# Patient Record
Sex: Female | Born: 1940 | Race: White | Hispanic: No | Marital: Married | Smoking: Never smoker
Health system: Southern US, Community
[De-identification: ages and names within clinical notes are randomized; demographics above are authoritative.]

## PROBLEM LIST (undated history)

## (undated) DIAGNOSIS — E119 Type 2 diabetes mellitus without complications: Secondary | ICD-10-CM

## (undated) DIAGNOSIS — T7840XA Allergy, unspecified, initial encounter: Secondary | ICD-10-CM

## (undated) DIAGNOSIS — F32A Depression, unspecified: Secondary | ICD-10-CM

## (undated) DIAGNOSIS — T8859XA Other complications of anesthesia, initial encounter: Secondary | ICD-10-CM

## (undated) DIAGNOSIS — G4730 Sleep apnea, unspecified: Secondary | ICD-10-CM

## (undated) DIAGNOSIS — I10 Essential (primary) hypertension: Secondary | ICD-10-CM

## (undated) DIAGNOSIS — F329 Major depressive disorder, single episode, unspecified: Secondary | ICD-10-CM

## (undated) DIAGNOSIS — K5790 Diverticulosis of intestine, part unspecified, without perforation or abscess without bleeding: Secondary | ICD-10-CM

## (undated) DIAGNOSIS — R011 Cardiac murmur, unspecified: Secondary | ICD-10-CM

## (undated) DIAGNOSIS — F419 Anxiety disorder, unspecified: Secondary | ICD-10-CM

## (undated) DIAGNOSIS — K219 Gastro-esophageal reflux disease without esophagitis: Secondary | ICD-10-CM

## (undated) DIAGNOSIS — T4145XA Adverse effect of unspecified anesthetic, initial encounter: Secondary | ICD-10-CM

## (undated) DIAGNOSIS — C801 Malignant (primary) neoplasm, unspecified: Secondary | ICD-10-CM

## (undated) DIAGNOSIS — E785 Hyperlipidemia, unspecified: Secondary | ICD-10-CM

## (undated) DIAGNOSIS — M1990 Unspecified osteoarthritis, unspecified site: Secondary | ICD-10-CM

## (undated) HISTORY — DX: Depression, unspecified: F32.A

## (undated) HISTORY — PX: BACK SURGERY: SHX140

## (undated) HISTORY — DX: Cardiac murmur, unspecified: R01.1

## (undated) HISTORY — DX: Essential (primary) hypertension: I10

## (undated) HISTORY — DX: Unspecified osteoarthritis, unspecified site: M19.90

## (undated) HISTORY — DX: Hyperlipidemia, unspecified: E78.5

## (undated) HISTORY — PX: LUMBAR LAMINECTOMY: SHX95

## (undated) HISTORY — DX: Gastro-esophageal reflux disease without esophagitis: K21.9

## (undated) HISTORY — PX: EYE SURGERY: SHX253

## (undated) HISTORY — PX: LUMBAR FUSION: SHX111

## (undated) HISTORY — DX: Major depressive disorder, single episode, unspecified: F32.9

## (undated) HISTORY — DX: Sleep apnea, unspecified: G47.30

## (undated) HISTORY — DX: Allergy, unspecified, initial encounter: T78.40XA

## (undated) HISTORY — DX: Anxiety disorder, unspecified: F41.9

## (undated) HISTORY — PX: UPPER GASTROINTESTINAL ENDOSCOPY: SHX188

## (undated) HISTORY — PX: COLONOSCOPY: SHX174

---

## 1999-07-04 ENCOUNTER — Encounter: Admission: RE | Admit: 1999-07-04 | Discharge: 1999-07-04 | Payer: Self-pay | Admitting: Obstetrics and Gynecology

## 1999-07-04 ENCOUNTER — Encounter: Payer: Self-pay | Admitting: Obstetrics and Gynecology

## 1999-07-07 ENCOUNTER — Encounter: Admission: RE | Admit: 1999-07-07 | Discharge: 1999-07-07 | Payer: Self-pay | Admitting: Obstetrics and Gynecology

## 1999-07-07 ENCOUNTER — Encounter: Payer: Self-pay | Admitting: Obstetrics and Gynecology

## 1999-09-14 ENCOUNTER — Other Ambulatory Visit: Admission: RE | Admit: 1999-09-14 | Discharge: 1999-09-14 | Payer: Self-pay | Admitting: Obstetrics and Gynecology

## 2000-10-02 ENCOUNTER — Encounter: Admission: RE | Admit: 2000-10-02 | Discharge: 2000-10-02 | Payer: Self-pay | Admitting: Obstetrics and Gynecology

## 2000-10-02 ENCOUNTER — Encounter: Payer: Self-pay | Admitting: Obstetrics and Gynecology

## 2001-02-05 ENCOUNTER — Other Ambulatory Visit: Admission: RE | Admit: 2001-02-05 | Discharge: 2001-02-05 | Payer: Self-pay | Admitting: Internal Medicine

## 2002-09-01 ENCOUNTER — Encounter: Payer: Self-pay | Admitting: Obstetrics and Gynecology

## 2002-09-01 ENCOUNTER — Encounter: Admission: RE | Admit: 2002-09-01 | Discharge: 2002-09-01 | Payer: Self-pay | Admitting: Obstetrics and Gynecology

## 2003-09-07 ENCOUNTER — Encounter: Admission: RE | Admit: 2003-09-07 | Discharge: 2003-09-07 | Payer: Self-pay | Admitting: Internal Medicine

## 2003-12-15 ENCOUNTER — Inpatient Hospital Stay (HOSPITAL_COMMUNITY): Admission: RE | Admit: 2003-12-15 | Discharge: 2003-12-20 | Payer: Self-pay | Admitting: Orthopaedic Surgery

## 2004-07-25 ENCOUNTER — Ambulatory Visit: Payer: Self-pay | Admitting: Internal Medicine

## 2004-07-31 ENCOUNTER — Ambulatory Visit: Payer: Self-pay | Admitting: Internal Medicine

## 2004-08-10 ENCOUNTER — Ambulatory Visit: Payer: Self-pay | Admitting: Internal Medicine

## 2004-10-10 ENCOUNTER — Ambulatory Visit: Payer: Self-pay | Admitting: Internal Medicine

## 2004-11-21 ENCOUNTER — Ambulatory Visit: Payer: Self-pay | Admitting: Internal Medicine

## 2005-01-02 ENCOUNTER — Ambulatory Visit: Payer: Self-pay | Admitting: Internal Medicine

## 2005-02-20 ENCOUNTER — Ambulatory Visit (HOSPITAL_COMMUNITY): Admission: RE | Admit: 2005-02-20 | Discharge: 2005-02-20 | Payer: Self-pay | Admitting: Obstetrics and Gynecology

## 2005-03-13 ENCOUNTER — Ambulatory Visit: Payer: Self-pay | Admitting: Internal Medicine

## 2005-05-11 ENCOUNTER — Ambulatory Visit: Payer: Self-pay | Admitting: Internal Medicine

## 2005-06-06 ENCOUNTER — Ambulatory Visit: Payer: Self-pay | Admitting: Internal Medicine

## 2005-06-14 ENCOUNTER — Ambulatory Visit: Payer: Self-pay | Admitting: Internal Medicine

## 2005-10-25 ENCOUNTER — Ambulatory Visit: Payer: Self-pay | Admitting: Internal Medicine

## 2005-11-08 ENCOUNTER — Ambulatory Visit (HOSPITAL_BASED_OUTPATIENT_CLINIC_OR_DEPARTMENT_OTHER): Admission: RE | Admit: 2005-11-08 | Discharge: 2005-11-08 | Payer: Self-pay | Admitting: Internal Medicine

## 2005-11-20 ENCOUNTER — Ambulatory Visit: Payer: Self-pay | Admitting: Pulmonary Disease

## 2006-01-09 ENCOUNTER — Ambulatory Visit: Payer: Self-pay | Admitting: Internal Medicine

## 2006-01-31 ENCOUNTER — Ambulatory Visit: Payer: Self-pay | Admitting: Internal Medicine

## 2006-05-09 ENCOUNTER — Ambulatory Visit: Payer: Self-pay | Admitting: Internal Medicine

## 2006-07-04 ENCOUNTER — Ambulatory Visit: Payer: Self-pay | Admitting: Internal Medicine

## 2006-09-03 HISTORY — PX: HIATAL HERNIA REPAIR: SHX195

## 2006-09-26 ENCOUNTER — Encounter (INDEPENDENT_AMBULATORY_CARE_PROVIDER_SITE_OTHER): Payer: Self-pay | Admitting: *Deleted

## 2006-09-26 ENCOUNTER — Ambulatory Visit: Payer: Self-pay | Admitting: Internal Medicine

## 2006-11-18 ENCOUNTER — Ambulatory Visit: Payer: Self-pay | Admitting: Internal Medicine

## 2006-11-18 LAB — CONVERTED CEMR LAB
ALT: 25 units/L (ref 0–40)
AST: 24 units/L (ref 0–37)
Albumin: 3.6 g/dL (ref 3.5–5.2)
BUN: 11 mg/dL (ref 6–23)
Bilirubin, Direct: 0.1 mg/dL (ref 0.0–0.3)
CO2: 29 meq/L (ref 19–32)
Calcium: 9.3 mg/dL (ref 8.4–10.5)
Chloride: 101 meq/L (ref 96–112)
Creatinine, Ser: 0.7 mg/dL (ref 0.4–1.2)
GFR calc non Af Amer: 89 mL/min
Glucose, Bld: 108 mg/dL — ABNORMAL HIGH (ref 70–99)
HDL: 49.4 mg/dL (ref >39.0)
Hgb A1c MFr Bld: 6.5 % — ABNORMAL HIGH (ref 4.6–6.0)
Sodium: 139 meq/L (ref 135–145)
Total Protein: 6.5 g/dL (ref 6.0–8.3)
VLDL: 19 mg/dL (ref 0–40)

## 2006-11-25 ENCOUNTER — Ambulatory Visit: Payer: Self-pay | Admitting: Internal Medicine

## 2006-11-28 ENCOUNTER — Ambulatory Visit: Payer: Self-pay | Admitting: Gastroenterology

## 2006-12-11 ENCOUNTER — Encounter: Admission: RE | Admit: 2006-12-11 | Discharge: 2006-12-11 | Payer: Self-pay | Admitting: Internal Medicine

## 2006-12-13 ENCOUNTER — Ambulatory Visit: Payer: Self-pay | Admitting: Internal Medicine

## 2007-01-02 ENCOUNTER — Ambulatory Visit: Payer: Self-pay | Admitting: Internal Medicine

## 2007-01-06 ENCOUNTER — Ambulatory Visit (HOSPITAL_COMMUNITY): Admission: RE | Admit: 2007-01-06 | Discharge: 2007-01-06 | Payer: Self-pay | Admitting: Internal Medicine

## 2007-01-13 ENCOUNTER — Ambulatory Visit: Payer: Self-pay | Admitting: Internal Medicine

## 2007-01-29 ENCOUNTER — Encounter: Payer: Self-pay | Admitting: Internal Medicine

## 2007-01-29 ENCOUNTER — Ambulatory Visit: Payer: Self-pay | Admitting: Internal Medicine

## 2007-03-10 ENCOUNTER — Ambulatory Visit: Payer: Self-pay | Admitting: Internal Medicine

## 2007-03-10 LAB — CONVERTED CEMR LAB
BUN: 16 mg/dL (ref 6–23)
Creatinine, Ser: 0.8 mg/dL (ref 0.4–1.2)

## 2007-03-17 ENCOUNTER — Ambulatory Visit: Payer: Self-pay | Admitting: Internal Medicine

## 2007-03-18 ENCOUNTER — Encounter: Admission: RE | Admit: 2007-03-18 | Discharge: 2007-03-18 | Payer: Self-pay | Admitting: Internal Medicine

## 2007-03-25 ENCOUNTER — Encounter: Payer: Self-pay | Admitting: Internal Medicine

## 2007-04-17 ENCOUNTER — Ambulatory Visit: Payer: Self-pay | Admitting: Internal Medicine

## 2007-04-17 DIAGNOSIS — K862 Cyst of pancreas: Secondary | ICD-10-CM | POA: Insufficient documentation

## 2007-04-17 DIAGNOSIS — F329 Major depressive disorder, single episode, unspecified: Secondary | ICD-10-CM

## 2007-04-17 DIAGNOSIS — Z8719 Personal history of other diseases of the digestive system: Secondary | ICD-10-CM

## 2007-04-17 DIAGNOSIS — I10 Essential (primary) hypertension: Secondary | ICD-10-CM

## 2007-04-17 DIAGNOSIS — M1990 Unspecified osteoarthritis, unspecified site: Secondary | ICD-10-CM

## 2007-04-17 DIAGNOSIS — K863 Pseudocyst of pancreas: Secondary | ICD-10-CM

## 2007-04-17 DIAGNOSIS — J309 Allergic rhinitis, unspecified: Secondary | ICD-10-CM

## 2007-04-17 DIAGNOSIS — K219 Gastro-esophageal reflux disease without esophagitis: Secondary | ICD-10-CM

## 2007-04-17 DIAGNOSIS — E785 Hyperlipidemia, unspecified: Secondary | ICD-10-CM

## 2007-04-17 LAB — CONVERTED CEMR LAB: HDL goal, serum: 40 mg/dL

## 2007-04-22 ENCOUNTER — Telehealth: Payer: Self-pay | Admitting: *Deleted

## 2007-04-22 ENCOUNTER — Telehealth: Payer: Self-pay | Admitting: Internal Medicine

## 2007-05-14 ENCOUNTER — Encounter (INDEPENDENT_AMBULATORY_CARE_PROVIDER_SITE_OTHER): Payer: Self-pay | Admitting: Surgery

## 2007-05-14 ENCOUNTER — Inpatient Hospital Stay (HOSPITAL_COMMUNITY): Admission: RE | Admit: 2007-05-14 | Discharge: 2007-05-17 | Payer: Self-pay | Admitting: Surgery

## 2007-06-10 ENCOUNTER — Encounter: Payer: Self-pay | Admitting: Internal Medicine

## 2007-06-12 ENCOUNTER — Ambulatory Visit: Payer: Self-pay | Admitting: Internal Medicine

## 2007-07-14 ENCOUNTER — Ambulatory Visit: Payer: Self-pay | Admitting: Internal Medicine

## 2007-08-06 ENCOUNTER — Encounter: Payer: Self-pay | Admitting: Internal Medicine

## 2007-09-16 ENCOUNTER — Ambulatory Visit: Payer: Self-pay | Admitting: Internal Medicine

## 2007-12-11 ENCOUNTER — Ambulatory Visit: Payer: Self-pay | Admitting: Internal Medicine

## 2007-12-11 LAB — CONVERTED CEMR LAB
ALT: 17 units/L (ref 0–35)
Albumin: 3.9 g/dL (ref 3.5–5.2)
CO2: 33 meq/L — ABNORMAL HIGH (ref 19–32)
Chloride: 104 meq/L (ref 96–112)
Cholesterol: 168 mg/dL (ref 0–200)
GFR calc Af Amer: 92 mL/min
GFR calc non Af Amer: 76 mL/min
LDL Cholesterol: 100 mg/dL — ABNORMAL HIGH (ref 0–99)
Potassium: 4.3 meq/L (ref 3.5–5.1)
Sodium: 142 meq/L (ref 135–145)
Total CHOL/HDL Ratio: 3.1
VLDL: 13 mg/dL (ref 0–40)

## 2007-12-18 ENCOUNTER — Ambulatory Visit: Payer: Self-pay | Admitting: Internal Medicine

## 2008-01-23 ENCOUNTER — Telehealth: Payer: Self-pay | Admitting: Internal Medicine

## 2008-01-27 ENCOUNTER — Ambulatory Visit: Payer: Self-pay | Admitting: Internal Medicine

## 2008-01-27 DIAGNOSIS — J019 Acute sinusitis, unspecified: Secondary | ICD-10-CM

## 2008-03-12 ENCOUNTER — Ambulatory Visit: Payer: Self-pay | Admitting: Internal Medicine

## 2008-03-12 LAB — CONVERTED CEMR LAB
ALT: 18 units/L (ref 0–35)
Albumin: 3.7 g/dL (ref 3.5–5.2)
Alkaline Phosphatase: 50 units/L (ref 39–117)
Bilirubin, Direct: 0.1 mg/dL (ref 0.0–0.3)
Calcium: 9.3 mg/dL (ref 8.4–10.5)
Chloride: 103 meq/L (ref 96–112)
Cholesterol: 178 mg/dL (ref 0–200)
GFR calc Af Amer: 92 mL/min
GFR calc non Af Amer: 76 mL/min
Glucose, Bld: 104 mg/dL — ABNORMAL HIGH (ref 70–99)
HDL: 51.8 mg/dL (ref >39.0)
Iron: 133 ug/dL (ref 42–145)
LDL Cholesterol: 101 mg/dL — ABNORMAL HIGH (ref 0–99)
Potassium: 4.1 meq/L (ref 3.5–5.1)
Total Bilirubin: 0.9 mg/dL (ref 0.3–1.2)
Total CHOL/HDL Ratio: 3.4
Triglycerides: 124 mg/dL (ref 0–149)
VLDL: 25 mg/dL (ref 0–40)
Vitamin B-12: 654 pg/mL (ref 211–911)

## 2008-03-19 ENCOUNTER — Ambulatory Visit: Payer: Self-pay | Admitting: Internal Medicine

## 2008-06-04 ENCOUNTER — Ambulatory Visit: Payer: Self-pay | Admitting: Internal Medicine

## 2008-09-07 ENCOUNTER — Ambulatory Visit: Payer: Self-pay | Admitting: Internal Medicine

## 2008-09-07 DIAGNOSIS — Z9181 History of falling: Secondary | ICD-10-CM

## 2008-09-07 DIAGNOSIS — M5137 Other intervertebral disc degeneration, lumbosacral region: Secondary | ICD-10-CM

## 2008-10-27 ENCOUNTER — Ambulatory Visit: Payer: Self-pay | Admitting: Internal Medicine

## 2008-10-27 LAB — CONVERTED CEMR LAB
BUN: 11 mg/dL (ref 6–23)
CO2: 28 meq/L (ref 19–32)
Chloride: 103 meq/L (ref 96–112)
Creatinine, Ser: 0.8 mg/dL (ref 0.4–1.2)
GFR calc Af Amer: 92 mL/min
Glucose, Bld: 108 mg/dL — ABNORMAL HIGH (ref 70–99)
Potassium: 3.5 meq/L (ref 3.5–5.1)

## 2009-01-25 ENCOUNTER — Ambulatory Visit: Payer: Self-pay | Admitting: Internal Medicine

## 2009-05-25 ENCOUNTER — Ambulatory Visit: Payer: Self-pay | Admitting: Internal Medicine

## 2009-05-25 LAB — CONVERTED CEMR LAB
AST: 19 units/L (ref 0–37)
Albumin: 3.9 g/dL (ref 3.5–5.2)
Alkaline Phosphatase: 50 units/L (ref 39–117)
Bilirubin, Direct: 0 mg/dL (ref 0.0–0.3)
Chloride: 108 meq/L (ref 96–112)
Cholesterol: 173 mg/dL (ref 0–200)
GFR calc non Af Amer: 75.56 mL/min (ref >60)
Glucose, Bld: 123 mg/dL — ABNORMAL HIGH (ref 70–99)
Potassium: 3.8 meq/L (ref 3.5–5.1)
TSH: 1.35 microintl units/mL (ref 0.35–5.50)
Total Bilirubin: 0.8 mg/dL (ref 0.3–1.2)
Total Protein: 7.3 g/dL (ref 6.0–8.3)

## 2009-06-01 ENCOUNTER — Ambulatory Visit: Payer: Self-pay | Admitting: Internal Medicine

## 2009-06-01 DIAGNOSIS — R7309 Other abnormal glucose: Secondary | ICD-10-CM

## 2009-06-15 ENCOUNTER — Ambulatory Visit: Payer: Self-pay | Admitting: Family Medicine

## 2009-08-09 ENCOUNTER — Ambulatory Visit: Payer: Self-pay | Admitting: Internal Medicine

## 2009-08-09 LAB — CONVERTED CEMR LAB
BUN: 17 mg/dL (ref 6–23)
CO2: 24 meq/L (ref 19–32)
Calcium: 9.5 mg/dL (ref 8.4–10.5)
Creatinine, Ser: 0.8 mg/dL (ref 0.4–1.2)
GFR calc non Af Amer: 75.51 mL/min (ref >60)
Glucose, Bld: 125 mg/dL — ABNORMAL HIGH (ref 70–99)
Hgb A1c MFr Bld: 6 % (ref 4.6–6.5)
Potassium: 3.5 meq/L (ref 3.5–5.1)

## 2009-08-16 ENCOUNTER — Ambulatory Visit: Payer: Self-pay | Admitting: Internal Medicine

## 2009-10-11 ENCOUNTER — Ambulatory Visit: Payer: Self-pay | Admitting: Internal Medicine

## 2009-10-11 LAB — CONVERTED CEMR LAB
ALT: 13 units/L (ref 0–35)
Alkaline Phosphatase: 46 units/L (ref 39–117)
Cholesterol: 263 mg/dL — ABNORMAL HIGH (ref 0–200)
Direct LDL: 172.8 mg/dL
Total Bilirubin: 0.3 mg/dL (ref 0.3–1.2)
Total Protein: 7 g/dL (ref 6.0–8.3)
Triglycerides: 159 mg/dL — ABNORMAL HIGH (ref 0.0–149.0)
VLDL: 31.8 mg/dL (ref 0.0–40.0)

## 2009-10-18 ENCOUNTER — Ambulatory Visit: Payer: Self-pay | Admitting: Internal Medicine

## 2009-10-18 DIAGNOSIS — IMO0001 Reserved for inherently not codable concepts without codable children: Secondary | ICD-10-CM | POA: Insufficient documentation

## 2009-10-19 ENCOUNTER — Telehealth: Payer: Self-pay | Admitting: Internal Medicine

## 2009-10-19 ENCOUNTER — Ambulatory Visit: Payer: Self-pay | Admitting: Internal Medicine

## 2009-12-13 ENCOUNTER — Ambulatory Visit: Payer: Self-pay | Admitting: Internal Medicine

## 2009-12-13 LAB — CONVERTED CEMR LAB
ALT: 15 units/L (ref 0–35)
Bilirubin, Direct: 0 mg/dL (ref 0.0–0.3)
Direct LDL: 125.1 mg/dL
Total Protein: 7.9 g/dL (ref 6.0–8.3)
Triglycerides: 127 mg/dL (ref 0.0–149.0)

## 2009-12-20 ENCOUNTER — Encounter: Admission: RE | Admit: 2009-12-20 | Discharge: 2009-12-20 | Payer: Self-pay | Admitting: Internal Medicine

## 2009-12-20 ENCOUNTER — Ambulatory Visit: Payer: Self-pay | Admitting: Internal Medicine

## 2010-03-09 ENCOUNTER — Ambulatory Visit: Payer: Self-pay | Admitting: Family Medicine

## 2010-03-09 DIAGNOSIS — N3000 Acute cystitis without hematuria: Secondary | ICD-10-CM | POA: Insufficient documentation

## 2010-03-09 LAB — CONVERTED CEMR LAB
Glucose, Urine, Semiquant: NEGATIVE
pH: 5

## 2010-03-10 ENCOUNTER — Encounter: Payer: Self-pay | Admitting: Internal Medicine

## 2010-03-15 DIAGNOSIS — A029 Salmonella infection, unspecified: Secondary | ICD-10-CM | POA: Insufficient documentation

## 2010-03-21 ENCOUNTER — Ambulatory Visit: Payer: Self-pay | Admitting: Internal Medicine

## 2010-03-22 ENCOUNTER — Encounter: Payer: Self-pay | Admitting: Internal Medicine

## 2010-03-22 ENCOUNTER — Telehealth: Payer: Self-pay | Admitting: Internal Medicine

## 2010-04-19 ENCOUNTER — Telehealth: Payer: Self-pay | Admitting: Internal Medicine

## 2010-05-01 ENCOUNTER — Telehealth: Payer: Self-pay | Admitting: Internal Medicine

## 2010-05-01 DIAGNOSIS — M25559 Pain in unspecified hip: Secondary | ICD-10-CM

## 2010-05-16 ENCOUNTER — Encounter: Admission: RE | Admit: 2010-05-16 | Discharge: 2010-05-16 | Payer: Self-pay | Admitting: Orthopaedic Surgery

## 2010-05-24 ENCOUNTER — Ambulatory Visit: Payer: Self-pay | Admitting: Family Medicine

## 2010-05-24 ENCOUNTER — Telehealth: Payer: Self-pay | Admitting: Family Medicine

## 2010-05-24 DIAGNOSIS — R110 Nausea: Secondary | ICD-10-CM | POA: Insufficient documentation

## 2010-05-24 LAB — CONVERTED CEMR LAB
Bilirubin Urine: NEGATIVE
Blood in Urine, dipstick: NEGATIVE
Glucose, Urine, Semiquant: NEGATIVE
Nitrite: NEGATIVE
Protein, U semiquant: NEGATIVE
Urobilinogen, UA: 0.2

## 2010-05-25 ENCOUNTER — Telehealth: Payer: Self-pay | Admitting: Internal Medicine

## 2010-05-25 ENCOUNTER — Encounter: Payer: Self-pay | Admitting: Family Medicine

## 2010-05-25 LAB — CONVERTED CEMR LAB
Basophils Relative: 0.2 % (ref 0.0–3.0)
Eosinophils Absolute: 0 10*3/uL (ref 0.0–0.7)
HCT: 39.2 % (ref 36.0–46.0)
Lymphocytes Relative: 16 % (ref 12.0–46.0)
Lymphs Abs: 2.1 10*3/uL (ref 0.7–4.0)
MCV: 92.6 fL (ref 78.0–100.0)
Monocytes Relative: 5.6 % (ref 3.0–12.0)
Neutrophils Relative %: 78.2 % — ABNORMAL HIGH (ref 43.0–77.0)
Platelets: 384 10*3/uL (ref 150.0–400.0)
RDW: 13.5 % (ref 11.5–14.6)
WBC: 12.9 10*3/uL — ABNORMAL HIGH (ref 4.5–10.5)

## 2010-05-26 ENCOUNTER — Ambulatory Visit: Payer: Self-pay | Admitting: Internal Medicine

## 2010-05-26 DIAGNOSIS — Q762 Congenital spondylolisthesis: Secondary | ICD-10-CM | POA: Insufficient documentation

## 2010-05-26 DIAGNOSIS — R4182 Altered mental status, unspecified: Secondary | ICD-10-CM | POA: Insufficient documentation

## 2010-05-26 LAB — CONVERTED CEMR LAB
Basophils Absolute: 0.1 10*3/uL (ref 0.0–0.1)
Basophils Relative: 1 % (ref 0–1)
Eosinophils Absolute: 0.1 10*3/uL (ref 0.0–0.7)
Eosinophils Relative: 1 % (ref 0–5)
HCT: 39.8 % (ref 36.0–46.0)
Hemoglobin: 13.6 g/dL (ref 12.0–15.0)
Lymphocytes Relative: 29 % (ref 12–46)
Lymphs Abs: 3.1 10*3/uL (ref 0.7–4.0)
MCHC: 34.2 g/dL (ref 30.0–36.0)
MCV: 88.4 fL (ref 78.0–100.0)
Monocytes Absolute: 0.8 10*3/uL (ref 0.1–1.0)
Monocytes Relative: 7 % (ref 3–12)
Neutro Abs: 6.9 10*3/uL (ref 1.7–7.7)
Neutrophils Relative %: 63 % (ref 43–77)
Platelets: 405 10*3/uL — ABNORMAL HIGH (ref 150–400)
RBC: 4.5 M/uL (ref 3.87–5.11)
RDW: 13.1 % (ref 11.5–15.5)
WBC: 11 10*3/uL — ABNORMAL HIGH (ref 4.0–10.5)

## 2010-05-27 ENCOUNTER — Encounter: Payer: Self-pay | Admitting: Internal Medicine

## 2010-05-30 ENCOUNTER — Telehealth: Payer: Self-pay | Admitting: Internal Medicine

## 2010-05-30 ENCOUNTER — Encounter: Payer: Self-pay | Admitting: *Deleted

## 2010-05-31 ENCOUNTER — Telehealth: Payer: Self-pay | Admitting: Internal Medicine

## 2010-06-20 ENCOUNTER — Ambulatory Visit: Payer: Self-pay | Admitting: Internal Medicine

## 2010-06-20 DIAGNOSIS — J069 Acute upper respiratory infection, unspecified: Secondary | ICD-10-CM | POA: Insufficient documentation

## 2010-06-20 LAB — CONVERTED CEMR LAB
Glucose, Urine, Semiquant: NEGATIVE
Specific Gravity, Urine: 1.025
WBC Urine, dipstick: NEGATIVE
pH: 5.5

## 2010-06-21 ENCOUNTER — Encounter: Payer: Self-pay | Admitting: Internal Medicine

## 2010-06-23 ENCOUNTER — Encounter: Payer: Self-pay | Admitting: Internal Medicine

## 2010-08-14 ENCOUNTER — Encounter
Admission: RE | Admit: 2010-08-14 | Discharge: 2010-08-14 | Payer: Self-pay | Source: Home / Self Care | Attending: Obstetrics and Gynecology | Admitting: Obstetrics and Gynecology

## 2010-08-14 LAB — HM MAMMOGRAPHY

## 2010-09-26 ENCOUNTER — Ambulatory Visit
Admission: RE | Admit: 2010-09-26 | Discharge: 2010-09-26 | Payer: Self-pay | Source: Home / Self Care | Attending: Internal Medicine | Admitting: Internal Medicine

## 2010-10-03 NOTE — Progress Notes (Signed)
Summary: hip pain  Phone Note Call from Patient   Caller: Patient Call For: Stacie Glaze MD Summary of Call: Pt cannot walk on right hip and leg even taking the Vicodin.  Wants to see Dr. Lovell Sheehan. [phone] Initial call taken by: Lynann Beaver CMA,  May 01, 2010 2:37 PM  New Problems: HIP PAIN, LEFT (ICD-719.45)   New Problems: HIP PAIN, LEFT (ICD-719.45)

## 2010-10-03 NOTE — Procedures (Signed)
Summary: EGD/[facility]  EGD/[facility]   Imported By: Sherian Rein 02/17/2010 09:50:36  _____________________________________________________________________  External Attachment:    Type:   Image     Comment:   External Document

## 2010-10-03 NOTE — Letter (Signed)
Summary: Generic Letter  [facility]  [address] [place], [place] [address]   Phone: [phone]  Fax: [phone]    05/30/2010  [facility] [address] [place], [place]  [address]  Dear Ms. Merkle,           Sincerely,   Melchor Amour, LPN

## 2010-10-03 NOTE — Assessment & Plan Note (Signed)
Summary: UTI/dm   Vital Signs:  Patient profile:   70 year old female Temp:     98.0 degrees F oral BP sitting:   150 / 90  (left arm) Cuff size:   large  Vitals Entered By: Sid Falcon LPN (March 09, 1609 9:44 AM) CC: dysuria X 6 days   History of Present Illness: 6 days of urine frequency and burning with urination. More fatigue than usual.  No nausea or vomiting. No back pain. No fever or chills.  No allergies.  Allergies (verified): No Known Drug Allergies  Past History:  Past Medical History: Last updated: 04/17/2007 Allergic rhinitis Depression GERD Hyperlipidemia Hypertension Osteoarthritis  Review of Systems      See HPI  Physical Exam  General:  Well-developed,well-nourished,in no acute distress; alert,appropriate and cooperative throughout examination Lungs:  Normal respiratory effort, chest expands symmetrically. Lungs are clear to auscultation, no crackles or wheezes. Heart:  normal rate and regular rhythm.     Impression & Recommendations:  Problem # 1:  CYSTITIS, ACUTE (ICD-595.0) Assessment New  Her updated medication list for this problem includes:    Ciprofloxacin Hcl 500 Mg Tabs (Ciprofloxacin hcl) ..... One by mouth two times a day for 7 days  Orders: T-Culture, Urine ([id number]) UA Dipstick w/o Micro (manual) ([address]147)  Complete Medication List: 1)  Losartan Potassium-hctz 100-12.5 Mg Tabs (Losartan potassium-hctz) .... 1/2 once daily 2)  Cymbalta 60 Mg Cpep (Duloxetine hcl) .... Once daily 3)  Prevacid 30 Mg Cpdr (Lansoprazole) .... Two times a day 4)  Verapamil Hcl Cr 240 Mg Tbcr (Verapamil hcl) .... Bid 5)  Zyrtec Allergy 10 Mg Tabs (Cetirizine hcl) .... Once daily 6)  Skelaxin 800 Mg Tabs (Metaxalone) .... One by mouth bid 7)  Alprazolam 1 Mg Xr24h-tab (Alprazolam) .Marland Kitchen.. 1 once daily 8)  Ambien Cr 12.5 Mg Cr-tabs (Zolpidem tartrate) .Marland Kitchen.. 1 at bedtime as needed sleep 9)  Mobic 15 Mg Tabs (Meloxicam) .... One by mouth daily 10)   Flonase 50 Mcg/act Susp (Fluticasone propionate) .... Two sprays q nostril daily 11)  Trilipix 135 Mg Cpdr (Choline fenofibrate) .... One by mouth daily 12)  Ciprofloxacin Hcl 500 Mg Tabs (Ciprofloxacin hcl) .... One by mouth two times a day for 7 days  Patient Instructions: 1)  Drink plenty of fluids up to 3-4 quarts a day. Cranberry juice is especially recommended in addition to large amounts of water. Avoid caffeine & carbonated drinks, they tend to irritate the bladder, Return in 3-5 days if you're not better: sooner if you're feeling worse.  Prescriptions: CIPROFLOXACIN HCL 500 MG TABS (CIPROFLOXACIN HCL) one by mouth two times a day for 7 days  #14 x 0   Entered and Authorized by:   Evelena Peat MD   Signed by:   Evelena Peat MD on 03/09/2010   Method used:   Electronically to        [facility]* (retail)       [address]       [place], [place]  [address]       Ph: [phone]       Fax: [phone]   RxID:   [id number]   Laboratory Results   Urine Tests    Routine Urinalysis   Color: yellow Appearance: Cloudy Glucose: negative   (Normal Range: Negative) Bilirubin: 1+   (Normal Range: Negative) Ketone: 1+   (Normal Range: Negative) Spec. Gravity: >=1.030   (Normal Range: 1.003-1.035) Blood: 3+   (Normal Range: Negative) pH: 5.0   (  Normal Range: 5.0-8.0) Protein: 3+   (Normal Range: Negative) Urobilinogen: 0.2   (Normal Range: 0-1) Nitrite: negative   (Normal Range: Negative) Leukocyte Esterace: 3+   (Normal Range: Negative)    Comments: Rita Ohara  March 09, 2010 9:47 AM

## 2010-10-03 NOTE — Progress Notes (Signed)
Summary: H&P/[facility]  H&P/[facility]   Imported By: Sherian Rein 02/17/2010 09:26:47  _____________________________________________________________________  External Attachment:    Type:   Image     Comment:   External Document

## 2010-10-03 NOTE — Progress Notes (Signed)
Summary: out of work note needed  Phone Note Call from Patient Call back at [employer] [phone]   Summary of Call: Needs of of work note starting 05-22-10 through 9-25, returning to work 9-26.  Fax to [phone], [employer] [facility] [employer] [facility], attn tera terrell. Initial call taken by: Rudy Jew, RN,  May 24, 2010 3:39 PM  Follow-up for Phone Call        I also received VM from pt asking how long Dr Caryl Never feels she should be out of work.  Inform pt so she knows and fax to [phone] Sid Falcon LPN  May 24, 2010 5:58 PM  OK to provide for dates listed.  Follow-up by: Evelena Peat MD,  May 24, 2010 6:01 PM  Additional Follow-up for Phone Call Additional follow up Details #1::        Letter, faxed, confirmation received, pt informed on personally identified VM Additional Follow-up by: Sid Falcon LPN,  May 25, 2010 10:18 AM

## 2010-10-03 NOTE — Progress Notes (Signed)
Summary: referral  Phone Note Call from Patient Call back at Home Phone [phone]   Caller: vm Summary of Call: Need referral to Dr. Danella Deis to check raw & crusty place on head & mole check.  Les Pou goes to her.  Prefer appt lunch time or a little before. Initial call taken by: Rudy Jew, RN,  April 19, 2010 4:07 PM  Follow-up for Phone Call        will try to make own appointment- if referral is needed call back Follow-up by: Willy Eddy, LPN,  April 19, 2010 4:53 PM

## 2010-10-03 NOTE — Assessment & Plan Note (Signed)
Summary: stomach and pain up/vomitting/cjr   Vital Signs:  Patient profile:   70 year old female Weight:      211 pounds Temp:     99.6 degrees F oral BP sitting:   160 / 110  (left arm) Cuff size:   large  Vitals Entered By: Sid Falcon LPN (May 24, 2010 1:56 PM)  Serial Vital Signs/Assessments:  Time      Position  BP       Pulse  Resp  Temp     By                     162/98                         Evelena Peat MD   History of Present Illness: Same-day appointment. Patient seen with some intermittent nausea and diffuse abdominal pain. Pain is dull 6/10 severity and bilateral mid to lower abdominal. Sunday night she had some vomiting. Has not had any vomiting since then has had poor appetite and some nausea. Denies urinary symptoms. No diarrhea. Has kept down some foods today.  Past hx gastric bypass surgery.  Patient has some chronic low back pain and is seeing orthopedist for this.   patient denies any sore throat, nasal congestion, cough, rashes. No history of bowel obstruction or diverticular disease. Finishing course of prednisone per ortho and feels somewhat "hyped up" on prenisone. Reportedly had MRI LS spine earlier in the week. Has been on some hydrocodone which she thinks might be causing some nausea.  Hx hypertension.  Out of Losartan for 3 days. No chest pains.  Allergies (verified): No Known Drug Allergies  Past History:  Past Medical History: Last updated: 04/17/2007 Allergic rhinitis Depression GERD Hyperlipidemia Hypertension Osteoarthritis  Past Surgical History: Last updated: 09/16/2007 Lumbar laminectomy Lumbar fusion gastric surgery  obesity type  and HH surgeon Wenda Low  Social History: Last updated: 04/17/2007 Occupation: Married Never Smoked PMH reviewed for relevance  Review of Systems      See HPI  Physical Exam  General:  Well-developed,well-nourished,in no acute distress; alert,appropriate and cooperative throughout  examination Ears:  External ear exam shows no significant lesions or deformities.  Otoscopic examination reveals clear canals, tympanic membranes are intact bilaterally without bulging, retraction, inflammation or discharge. Hearing is grossly normal bilaterally. Mouth:  Oral mucosa and oropharynx without lesions or exudates.  Teeth in good repair. Neck:  No deformities, masses, or tenderness noted. Lungs:  Normal respiratory effort, chest expands symmetrically. Lungs are clear to auscultation, no crackles or wheezes. Heart:  normal rate and regular rhythm.   Abdomen:  soft, non-tender, normal bowel sounds, no distention, no masses, no guarding, no rigidity, no hepatomegaly, and no splenomegaly.   Skin:  no rashes.   Cervical Nodes:  No lymphadenopathy noted   Impression & Recommendations:  Problem # 1:  ABDOMINAL PAIN, UNSPECIFIED SITE (ICD-789.00) normal bowel sounds.  No evid for SBO.  Check UA and CBC.  No localized tenderness. Orders: UA Dipstick w/o Micro (manual) ([address]109) Specimen Handling (99000) Venipuncture (60454) TLB-CBC Platelet - w/Differential (85025-CBCD)  Problem # 2:  NAUSEA (ICD-787.02) ?sec to meds. Her updated medication list for this problem includes:    Zyrtec Allergy 10 Mg Tabs (Cetirizine hcl) ..... Once daily  Problem # 3:  HYPERTENSION (ICD-401.9) Assessment: Deteriorated  get back on losartan and close follow with primary physician. Her updated medication list for this problem includes:  Losartan Potassium-hctz 100-12.5 Mg Tabs (Losartan potassium-hctz) .Marland Kitchen... 1/2 once daily    Verapamil Hcl Cr 240 Mg Tbcr (Verapamil hcl) ..... Bid  Orders: Prescription Created Electronically [phone])  Complete Medication List: 1)  Losartan Potassium-hctz 100-12.5 Mg Tabs (Losartan potassium-hctz) .... 1/2 once daily 2)  Cymbalta 60 Mg Cpep (Duloxetine hcl) .... Once daily 3)  Prevacid 30 Mg Cpdr (Lansoprazole) .... Two times a day 4)  Verapamil Hcl Cr 240 Mg Tbcr  (Verapamil hcl) .... Bid 5)  Zyrtec Allergy 10 Mg Tabs (Cetirizine hcl) .... Once daily 6)  Skelaxin 800 Mg Tabs (Metaxalone) .... One by mouth bid 7)  Alprazolam 1 Mg Xr24h-tab (Alprazolam) .Marland Kitchen.. 1 once daily 8)  Mobic 15 Mg Tabs (Meloxicam) .... One by mouth daily 9)  Flonase 50 Mcg/act Susp (Fluticasone propionate) .... Two sprays q nostril daily 10)  Trilipix 135 Mg Cpdr (Choline fenofibrate) .... One by mouth daily  Patient Instructions: 1)  Follow up promptly for any worsening fever or abdominal pain. Prescriptions: LOSARTAN POTASSIUM-HCTZ 100-12.5 MG TABS (LOSARTAN POTASSIUM-HCTZ) 1/2 once daily  #30 x 0   Entered and Authorized by:   Evelena Peat MD   Signed by:   Evelena Peat MD on 05/24/2010   Method used:   Electronically to        [facility]* (retail)       [address]       [place], [place]  [address]       Ph: [phone]       Fax: [phone]   RxID:   [id number]   Laboratory Results   Urine Tests    Routine Urinalysis   Color: lt. yellow Appearance: Clear Glucose: negative   (Normal Range: Negative) Bilirubin: negative   (Normal Range: Negative) Ketone: negative   (Normal Range: Negative) Spec. Gravity: 1.010   (Normal Range: 1.003-1.035) Blood: negative   (Normal Range: Negative) pH: 7.0   (Normal Range: 5.0-8.0) Protein: negative   (Normal Range: Negative) Urobilinogen: 0.2   (Normal Range: 0-1) Nitrite: negative   (Normal Range: Negative) Leukocyte Esterace: negative   (Normal Range: Negative)    Comments: Sid Falcon LPN  May 24, 2010 2:57 PM

## 2010-10-03 NOTE — Progress Notes (Signed)
Summary: need order and results  Phone Note Call from Patient Call back at Home Phone [phone]   Caller: [facility] mail Summary of Call: she will be dropping off her copy of MRI from [facility]. They will need an order faxed to have a steroid shot in her back. wants Dr Lovell Sheehan' permission. shot has not been scheduled yet. please advise. She is out of work until 06-07-2010. She still have some localized pain in hip and thigh area. Need also culture test results. Initial call taken by: Warnell Forester,  May 30, 2010 12:18 PM  Follow-up for Phone Call        ok for back injection per dr Lovell Sheehan and ok sent to dr Noel Gerold.  left message on machine for pt and told her c and s was oik Follow-up by: Willy Eddy, LPN,  May 30, 2010 2:59 PM

## 2010-10-03 NOTE — Assessment & Plan Note (Signed)
Summary: 2 MNTH ROV//SLM   Vital Signs:  Patient profile:   70 year old female Height:      66 inches Weight:      215 pounds BMI:     34.83 Temp:     98.2 degrees F oral Pulse rate:   84 / minute Resp:     14 per minute BP sitting:   160 / 80  (left arm)  Vitals Entered By: Willy Eddy, LPN (October 18, 2009 11:43 AM) CC: roa labs- no avalide in several days, Hypertension Management   CC:  roa labs- no avalide in several days and Hypertension Management.  History of Present Illness: Has been under stress and has been out of her meds and was "suprized " that her blood pressure was elevated.   Hypertension History:      She denies headache, chest pain, palpitations, dyspnea with exertion, orthopnea, PND, peripheral edema, visual symptoms, neurologic problems, syncope, and side effects from treatment.  missed several doses of medications.        Positive major cardiovascular risk factors include female age 35 years old or older, hyperlipidemia, and hypertension.  Negative major cardiovascular risk factors include no history of diabetes, negative family history for ischemic heart disease, and non-tobacco-user status.        Further assessment for target organ damage reveals no history of ASHD, stroke/TIA, or peripheral vascular disease.     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) 9)  Osteoarthritis  (ICD-715.90) 10)  Hypertension  (ICD-401.9) 11)  Hyperlipidemia  (ICD-272.4) 12)  Gerd  (ICD-530.81) 13)   Depression  (ICD-311) 14)  Allergic Rhinitis  (ICD-477.9)  Current Problems (verified): 1)  Contusion, Head  (ICD-920) 2)  Hyperglycemia, Mild  (ICD-790.29) 3)  Degenerative Disc Disease, Lumbosacral Spine  (ICD-722.52) 4)  Risk of Falling  (ICD-V15.88) 5)  Acute Sinusitis, Unspecified  (ICD-461.9) 6)  Family History Diabetes 1st Degree Relative  (ICD-V18.0) 7)  Gastric Polyp, Hx of  (ICD-V12.79) 8)  Cyst/pseudocyst, 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 1/2 Once Daily 2)  Cymbalta 60 Mg Cpep (Duloxetine Hcl) .... Once Daily 3)  Prevacid 30 Mg Cpdr (Lansoprazole) .... Two Times A Day 4)  Verapamil Hcl Cr 240 Mg Tbcr (Verapamil Hcl) .... Bid 5)  Zyrtec Allergy 10 Mg Tabs (Cetirizine Hcl) .... Once Daily 6)  Skelaxin 800 Mg  Tabs (Metaxalone) .... One By Mouth Bid 7)  Alprazolam 1 Mg  Tabs (Alprazolam) .... Once Daily 8)  Ambien Cr 12.5 Mg Cr-Tabs (Zolpidem Tartrate) .Marland Kitchen.. 1 At Bedtime As Needed Sleep 9)  Mobic 15 Mg  Tabs (Meloxicam) .... One By Mouth Daily 10)  Flonase 50 Mcg/act  Susp (Fluticasone Propionate) .... Two Sprays Q Nostril Daily 11)  Crestor 20 Mg Tabs (Rosuvastatin Calcium) .... One By Mouth Weekly  Current Medications (verified): 1)  Avalide 300-12.5 Mg Tabs (Irbesartan-Hydrochlorothiazide) .... 1/2 Once Daily 2)  Cymbalta 60 Mg Cpep (Duloxetine Hcl) .... Once Daily 3)  Prevacid 30 Mg Cpdr (Lansoprazole) .... Two Times A Day 4)  Verapamil Hcl Cr 240 Mg Tbcr (Verapamil Hcl) .... Bid 5)  Zyrtec Allergy 10 Mg Tabs (Cetirizine Hcl) .... Once Daily 6)  Skelaxin 800 Mg  Tabs (Metaxalone) .... One By Mouth Bid 7)  Alprazolam 1 Mg  Tabs (Alprazolam) .... Once Daily 8)  Ambien Cr 12.5 Mg Cr-Tabs (Zolpidem  Tartrate) .Marland Kitchen.. 1 At Bedtime As Needed Sleep 9)  Mobic 15 Mg  Tabs (Meloxicam) .... One By Mouth Daily 10)  Flonase 50 Mcg/act  Susp (Fluticasone Propionate) .... Two Sprays Q Nostril Daily  Allergies (verified): No Known Drug Allergies  Past History:  Family History: Last updated: 04/17/2007 Heart dx at advanced age in mother and father Family History Diabetes 1st degree relative  Social History: Last updated: 04/17/2007 Occupation: Married Never Smoked  Risk Factors: Smoking Status: never (10/18/2009) Passive Smoke Exposure: yes (10/18/2009)  Past medical, surgical, family and social histories (including risk factors) reviewed, and no changes noted (except as noted below).  Past Medical History: Reviewed history from 04/17/2007 and no changes required. Allergic rhinitis Depression GERD Hyperlipidemia Hypertension Osteoarthritis  Past Surgical History: Reviewed history from 09/16/2007 and no changes required. Lumbar laminectomy Lumbar fusion gastric surgery  obesity type  and HH surgeon Wenda Low  Family History: Reviewed history from 04/17/2007 and no changes required. Heart dx at advanced age in mother and father Family History Diabetes 1st degree relative  Social History: Reviewed history from 04/17/2007 and no changes required. Occupation: Married Never Smoked  Review of Systems  The patient denies anorexia, fever, weight loss, weight gain, vision loss, decreased hearing, hoarseness, chest pain, syncope, dyspnea on exertion, peripheral edema, prolonged cough, headaches, abdominal pain, melena, hematochezia, severe indigestion/heartburn, hematuria, incontinence, genital sores, muscle weakness, suspicious skin lesions, transient blindness, difficulty walking, depression, unusual weight change, abnormal bleeding, enlarged lymph nodes, angioedema, and breast masses.    Physical Exam  General:  alert and overweight-appearing.   Eyes:  pupils equal and  pupils round.   Ears:  R ear normal and L ear normal.   Nose:  no external deformity and no nasal discharge.   Mouth:  no gingival abnormalities and pharynx pink and moist.   Neck:  No deformities, masses, or tenderness noted. Lungs:  normal respiratory effort and no wheezes.   Heart:  normal rate and regular rhythm.   Abdomen:  Bowel sounds positive,abdomen soft and non-tender without masses, organomegaly or hernias noted. Msk:  No deformity or scoliosis noted of thoracic or lumbar spine.  joint tenderness.   Pulses:  R and L carotid,radial,femoral,dorsalis pedis and posterior tibial pulses are full and equal bilaterally Extremities:  full ROM both hips.  No pain with internal/external rotation.  Upper extremity exam normal. Neurologic:  alert & oriented X3, cranial nerves II-XII intact, and strength normal in all extremities.     Impression & Recommendations:  Problem # 1:  HYPERTENSION (ICD-401.9)  Her updated medication list for this problem includes:    Avalide 300-12.5 Mg Tabs (Irbesartan-hydrochlorothiazide) .Marland Kitchen... 1/2 once daily    Verapamil Hcl Cr 240 Mg Tbcr (Verapamil hcl) ..... Bid  BP today: 160/80 Prior BP: 140/80 (08/16/2009)  10 Yr Risk Heart Disease: 8 % Prior 10 Yr Risk Heart Disease: 11 % (08/16/2009)  Labs Reviewed: K+: 3.5 (08/09/2009) Creat: : 0.8 (08/09/2009)  Chol: 263 (10/11/2009)   HDL: 62.00 (10/11/2009)   LDL: 99 (05/25/2009)   TG: 159.0 (10/11/2009)  Problem # 2:  HYPERLIPIDEMIA (ICD-272.4) Assessment: Unchanged did not take the crestor due to the increased pain has failed three different statins The following medications were removed from the medication list:    Crestor 20 Mg Tabs (Rosuvastatin calcium) ..... One by mouth weekly Her updated medication list for this problem includes:    Trilipix 135 Mg Cpdr (Choline fenofibrate) ..... One by mouth daily  Labs Reviewed: SGOT: 16 (10/11/2009)   SGPT: 13 (10/11/2009)  Lipid Goals: Chol Goal: 200  (04/17/2007)   HDL Goal: 40 (04/17/2007)   LDL Goal: 130 (04/17/2007)   TG Goal: 150 (04/17/2007)  10 Yr Risk Heart Disease: 8 % Prior 10 Yr Risk Heart Disease: 11 % (08/16/2009)   HDL:62.00 (10/11/2009), 47.30 (05/25/2009)  LDL:99 (05/25/2009), 101 (81/19/1478)  Chol:263 (10/11/2009), 173 (05/25/2009)  Trig:159.0 (10/11/2009), 136.0 (05/25/2009)  Problem # 3:  MYALGIA (ICD-729.1) resiolved with stopping the cholesterol drugs Her updated medication list for this problem includes:    Skelaxin 800 Mg Tabs (Metaxalone) ..... One by mouth bid    Mobic 15 Mg Tabs (Meloxicam) ..... One by mouth daily  Complete Medication List: 1)  Avalide 300-12.5 Mg Tabs (Irbesartan-hydrochlorothiazide) .... 1/2 once daily 2)  Cymbalta 60 Mg Cpep (Duloxetine hcl) .... Once daily 3)  Prevacid 30 Mg Cpdr (Lansoprazole) .... Two times a day 4)  Verapamil Hcl Cr 240 Mg Tbcr (Verapamil hcl) .... Bid 5)  Zyrtec Allergy 10 Mg Tabs (Cetirizine hcl) .... Once daily 6)  Skelaxin 800 Mg Tabs (Metaxalone) .... One by mouth bid 7)  Alprazolam 1 Mg Tabs (Alprazolam) .... Once daily 8)  Ambien Cr 12.5 Mg Cr-tabs (Zolpidem tartrate) .Marland Kitchen.. 1 at bedtime as needed sleep 9)  Mobic 15 Mg Tabs (Meloxicam) .... One by mouth daily 10)  Flonase 50 Mcg/act Susp (Fluticasone propionate) .... Two sprays q nostril daily 11)  Trilipix 135 Mg Cpdr (Choline fenofibrate) .... One by mouth daily  Hypertension Assessment/Plan:      The patient's hypertensive risk group is category B: At least one risk factor (excluding diabetes) with no target organ damage.  Her calculated 10 year risk of coronary heart disease is 8 %.  Today's blood pressure is 160/80.  Her blood pressure goal is < 140/90.  Patient Instructions: 1)  Please schedule a follow-up appointment in 2 months. 2)  Hepatic Panel prior to visit, ICD-9:995.20 3)  Lipid Panel prior to visit, ICD-9:272.4 Prescriptions: ALPRAZOLAM 1 MG  TABS (ALPRAZOLAM) once daily  #30 x 6   Entered  and Authorized by:   Stacie Glaze MD   Signed by:   Stacie Glaze MD on 10/18/2009   Method used:   Print then Give to Patient   RxID:   [id number] AVALIDE 300-12.5 MG TABS (IRBESARTAN-HYDROCHLOROTHIAZIDE) 1/2 once daily  #10 x 0   Entered by:   Willy Eddy, LPN   Authorized by:   Stacie Glaze MD   Signed by:   Willy Eddy, LPN on 62/95/2841   Method used:   Electronically to        [facility]* (mail-order)       [address] [place], [place]  [address]       Ph: [phone]       Fax: [phone]   RxID:   [id number] AVALIDE 300-12.5 MG TABS (IRBESARTAN-HYDROCHLOROTHIAZIDE) 1/2 once  daily  #45 x 3   Entered by:   Willy Eddy, LPN   Authorized by:   Stacie Glaze MD   Signed by:   Willy Eddy, LPN on 16/06/9603   Method used:   Electronically to        [facility]* (mail-order)       [address] [place], [place]  [address]       Ph: [phone]       Fax: [phone]   RxID:   [id number]

## 2010-10-03 NOTE — Medication Information (Signed)
Summary: Order for CPAP Supplies/[facility]  Order for CPAP Supplies/[facility]   Imported By: Maryln Gottron 06/27/2010 15:11:32  _____________________________________________________________________  External Attachment:    Type:   Image     Comment:   External Document

## 2010-10-03 NOTE — Progress Notes (Signed)
Summary: results  Phone Note Call from Patient Call back at Home Phone [phone]   Caller: vm Complaint: Breathing Problems Summary of Call: Wants to speak to [facility] about results of urine test. Astrid Drafts have scheduled appt to see Dr. [place] Service & he's already called to see if I'm coming.  Whether I go or not depends on the test results.   Initial call taken by: Rudy Jew, RN,  March 22, 2010 3:57 PM  Follow-up for Phone Call        c and s neg - left message on machine  to cx appointment with urologist Follow-up by: Willy Eddy, LPN,  March 23, 2010 11:52 AM

## 2010-10-03 NOTE — Assessment & Plan Note (Signed)
Summary: 2 month rov/njr   Vital Signs:  Patient profile:   70 year old female Height:      66 inches Weight:      209 pounds BMI:     33.86 Temp:     98.2 degrees F oral Pulse rate:   68 / minute Resp:     14 per minute BP sitting:   136 / 80  (left arm)  Vitals Entered By: Willy Eddy, LPN (December 20, 2009 12:12 PM) CC: roa labs, Hypertension Management   CC:  roa labs and Hypertension Management.  History of Present Illness: weight down and she has been doing well with minor OA pains but generally feels well  Hypertension History:      She denies headache, chest pain, palpitations, dyspnea with exertion, orthopnea, PND, peripheral edema, visual symptoms, neurologic problems, syncope, and side effects from treatment.        Positive major cardiovascular risk factors include female age 3 years old or older, hyperlipidemia, and hypertension.  Negative major cardiovascular risk factors include no history of diabetes, negative family history for ischemic heart disease, and non-tobacco-user status.        Further assessment for target organ damage reveals no history of ASHD, stroke/TIA, or peripheral vascular disease.     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) 11)  Osteoarthritis  (ICD-715.90) 12)  Hypertension  (ICD-401.9) 13)  Hyperlipidemia  (ICD-272.4) 14)  Gerd  (ICD-530.81) 15)  Depression  (ICD-311) 16)  Allergic  Rhinitis  (ICD-477.9)  Current Problems (verified): 1)  Nonspec React Tuberculin Skn Test w/o Actv Tb  (ICD-795.5) 2)  Myalgia  (ICD-729.1) 3)  Contusion, Head  (ICD-920) 4)  Hyperglycemia, Mild  (ICD-790.29) 5)  Degenerative Disc Disease, Lumbosacral Spine  (ICD-722.52) 6)  Risk of Falling  (ICD-V15.88) 7)  Acute Sinusitis, Unspecified  (ICD-461.9) 8)  Family History Diabetes 1st Degree Relative  (ICD-V18.0) 9)  Gastric Polyp, Hx of  (ICD-V12.79) 10)  Cyst/pseudocyst, 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 1/2 Once Daily 2)  Cymbalta 60 Mg Cpep (Duloxetine Hcl) .... Once Daily 3)  Prevacid 30 Mg Cpdr (Lansoprazole) .... Two Times A Day 4)  Verapamil Hcl Cr 240 Mg Tbcr (Verapamil Hcl) .... Bid 5)  Zyrtec Allergy 10 Mg Tabs (Cetirizine Hcl) .... Once Daily 6)  Skelaxin 800 Mg  Tabs (Metaxalone) .... One By Mouth Bid 7)  Alprazolam 1 Mg Xr24h-Tab (Alprazolam) .Marland Kitchen.. 1 Once Daily 8)  Ambien Cr 12.5 Mg Cr-Tabs (Zolpidem Tartrate) .Marland Kitchen.. 1 At Bedtime As Needed Sleep 9)  Mobic 15 Mg  Tabs (Meloxicam) .... One By Mouth Daily 10)  Flonase 50 Mcg/act  Susp (Fluticasone Propionate) .... Two Sprays Q Nostril  Daily 11)  Trilipix 135 Mg Cpdr (Choline Fenofibrate) .... One By Mouth Daily  Current Medications (verified): 1)  Losartan Potassium-Hctz 100-12.5 Mg Tabs (Losartan Potassium-Hctz) .... 1/2 Once Daily 2)  Cymbalta 60 Mg Cpep (Duloxetine Hcl) .... Once Daily 3)  Prevacid 30 Mg Cpdr (Lansoprazole) .... Two Times A Day 4)  Verapamil Hcl Cr 240 Mg Tbcr (Verapamil Hcl) .... Bid 5)  Zyrtec Allergy 10 Mg Tabs (Cetirizine Hcl) .... Once Daily 6)  Skelaxin 800 Mg  Tabs (Metaxalone) .... One By Mouth Bid 7)  Alprazolam 1 Mg  Xr24h-Tab (Alprazolam) .Marland Kitchen.. 1 Once Daily 8)  Ambien Cr 12.5 Mg Cr-Tabs (Zolpidem Tartrate) .Marland Kitchen.. 1 At Bedtime As Needed Sleep 9)  Mobic 15 Mg  Tabs (Meloxicam) .... One By Mouth Daily 10)  Flonase 50 Mcg/act  Susp (Fluticasone Propionate) .... Two Sprays Q Nostril Daily 11)  Trilipix 135 Mg Cpdr (Choline Fenofibrate) .... One By Mouth Daily  Allergies (verified): No Known Drug Allergies  Past History:  Family History: Last updated: 04/17/2007 Heart dx at advanced age in mother and father Family History Diabetes 1st degree relative  Social History: Last updated: 04/17/2007 Occupation: Married Never Smoked  Risk Factors: Smoking Status: never (12/20/2009) Passive Smoke Exposure: yes (12/20/2009)  Past medical, surgical, family and social histories (including risk factors) reviewed, and no changes noted (except as noted below).  Past Medical History: Reviewed history from 04/17/2007 and no changes required. Allergic rhinitis Depression GERD Hyperlipidemia Hypertension Osteoarthritis  Past Surgical History: Reviewed history from 09/16/2007 and no changes required. Lumbar laminectomy Lumbar fusion gastric surgery  obesity type  and HH surgeon Wenda Low  Family History: Reviewed history from 04/17/2007 and no changes required. Heart dx at advanced age in mother and father Family History Diabetes 1st degree relative  Social History: Reviewed history from 04/17/2007 and no changes required. Occupation: Married Never Smoked  Review of Systems  The patient denies anorexia, fever, weight loss, weight gain, vision loss, decreased hearing, hoarseness, chest pain, syncope, dyspnea on exertion, peripheral edema, prolonged cough, headaches, hemoptysis, abdominal pain, melena, hematochezia, severe indigestion/heartburn, hematuria, incontinence, genital sores, muscle weakness, suspicious skin lesions, transient blindness, difficulty walking, depression, unusual weight change,  abnormal bleeding, enlarged lymph nodes, angioedema, and breast masses.    Physical Exam  General:  alert and overweight-appearing.   Head:  normocephalic and no abnormalities observed.   Eyes:  pupils equal and pupils round.   Ears:  R ear normal and L ear normal.   Nose:  no external deformity and no nasal discharge.   Mouth:  no gingival abnormalities and pharynx pink and moist.   Neck:  No deformities, masses, or tenderness noted. Lungs:  normal respiratory effort and no wheezes.   Heart:  normal rate and regular rhythm.   Abdomen:  Bowel sounds positive,abdomen soft and non-tender without masses, organomegaly or hernias noted. Pulses:  R and L carotid,radial,femoral,dorsalis pedis and posterior tibial pulses are full and equal bilaterally Extremities:  full ROM both hips.  No pain with internal/external rotation.  Upper extremity exam normal.   Impression & Recommendations:  Problem # 1:  HYPERTENSION (ICD-401.9) Assessment Unchanged  Her updated medication list for this problem includes:    Losartan Potassium-hctz 100-12.5 Mg Tabs (Losartan potassium-hctz) .Marland Kitchen... 1/2 once daily    Verapamil Hcl Cr 240 Mg Tbcr (Verapamil hcl) ..... Bid  BP today: 136/80 Prior BP: 160/80 (10/18/2009)  10 Yr Risk Heart Disease: 5 % Prior 10 Yr Risk Heart Disease: 8 % (10/18/2009)  Labs  Reviewed: K+: 3.5 (08/09/2009) Creat: : 0.8 (08/09/2009)   Chol: 212 (12/13/2009)   HDL: 62.60 (12/13/2009)   LDL: 99 (05/25/2009)   TG: 127.0 (12/13/2009)  Problem # 2:  HYPERLIPIDEMIA (ICD-272.4) Assessment: Deteriorated reviewed goals Her updated medication list for this problem includes:    Trilipix 135 Mg Cpdr (Choline fenofibrate) ..... One by mouth daily  Labs Reviewed: SGOT: 20 (12/13/2009)   SGPT: 15 (12/13/2009)  Lipid Goals: Chol Goal: 200 (04/17/2007)   HDL Goal: 40 (04/17/2007)   LDL Goal: 130 (04/17/2007)   TG Goal: 150 (04/17/2007)  10 Yr Risk Heart Disease: 5 % Prior 10 Yr Risk Heart  Disease: 8 % (10/18/2009)   HDL:62.60 (12/13/2009), 62.00 (10/11/2009)  LDL:99 (05/25/2009), 101 (16/06/9603)  Chol:212 (12/13/2009), 263 (10/11/2009)  Trig:127.0 (12/13/2009), 159.0 (10/11/2009)  Problem # 3:  GERD (ICD-530.81)  Her updated medication list for this problem includes:    Prevacid 30 Mg Cpdr (Lansoprazole) .Marland Kitchen..Marland Kitchen Two times a day  Complete Medication List: 1)  Losartan Potassium-hctz 100-12.5 Mg Tabs (Losartan potassium-hctz) .... 1/2 once daily 2)  Cymbalta 60 Mg Cpep (Duloxetine hcl) .... Once daily 3)  Prevacid 30 Mg Cpdr (Lansoprazole) .... Two times a day 4)  Verapamil Hcl Cr 240 Mg Tbcr (Verapamil hcl) .... Bid 5)  Zyrtec Allergy 10 Mg Tabs (Cetirizine hcl) .... Once daily 6)  Skelaxin 800 Mg Tabs (Metaxalone) .... One by mouth bid 7)  Alprazolam 1 Mg Xr24h-tab (Alprazolam) .Marland Kitchen.. 1 once daily 8)  Ambien Cr 12.5 Mg Cr-tabs (Zolpidem tartrate) .Marland Kitchen.. 1 at bedtime as needed sleep 9)  Mobic 15 Mg Tabs (Meloxicam) .... One by mouth daily 10)  Flonase 50 Mcg/act Susp (Fluticasone propionate) .... Two sprays q nostril daily 11)  Trilipix 135 Mg Cpdr (Choline fenofibrate) .... One by mouth daily  Hypertension Assessment/Plan:      The patient's hypertensive risk group is category B: At least one risk factor (excluding diabetes) with no target organ damage.  Her calculated 10 year risk of coronary heart disease is 5 %.  Today's blood pressure is 136/80.  Her blood pressure goal is < 140/90.  Patient Instructions: 1)  Please schedule a follow-up appointment in 3 months.

## 2010-10-03 NOTE — Assessment & Plan Note (Signed)
Summary: med reaction/bmw   Vital Signs:  Patient profile:   70 year old female Height:      66 inches Weight:      204 pounds BMI:     33.05 Temp:     98.2 degrees F oral Pulse rate:   72 / minute Resp:     14 per minute BP sitting:   140 / 80  (left arm)  Vitals Entered By: Willy Eddy, LPN (May 26, 2010 4:24 PM) CC: to dr Valorie Roosevelt for back pain and was given to rounds of prednisone and when starting wecond round,became nauseated and generalized sickness- stopped prednisone an d vicodan for pain--pt suggest that could be coming from losartin Is Patient Diabetic? No   CC:  to dr Valorie Roosevelt for back pain and was given to rounds of prednisone and when starting wecond round and became nauseated and generalized sickness- stopped prednisone an d vicodan for pain--pt suggest that could be coming from losartin.  History of Present Illness: Started hydrocodone for back pain and added prednisone from Dr Noel Gerold increased pain after finished repeated the prednisone and increased the pain meds one the predenisone she developed a fever, nausea and  has urgency no burning, had urgency, has been using a pad for two months ( incontinance) the side effect profile of the hyzaar and she thinks that this is contributing to this she thinks that at one time she was alergic to HCTZ?  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) 3)  Nausea  (ICD-787.02) 4)  Hip Pain, Left  (ICD-719.45) 5)  Salmonella Infection  (ICD-003.9) 6)  Cystitis, Acute  (ICD-595.0) 7)  Nonspec React Tuberculin Skn Test w/o Actv Tb  (ICD-795.5) 8)  Myalgia  (ICD-729.1) 9)  Contusion, Head  (ICD-920) 10)  Hyperglycemia, Mild  (ICD-790.29) 11)  Degenerative Disc Disease, Lumbosacral Spine  (ICD-722.52) 12)  Risk of  Falling  (ICD-V15.88) 13)  Acute Sinusitis, Unspecified  (ICD-461.9) 14)  Family History Diabetes 1st Degree Relative  (ICD-V18.0) 15)  Gastric Polyp, Hx of  (ICD-V12.79) 16)  Cyst/pseudocyst, 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) 17)  Osteoarthritis  (ICD-715.90) 18)  Hypertension  (ICD-401.9) 19)  Hyperlipidemia  (ICD-272.4) 20)  Gerd  (ICD-530.81) 21)  Depression  (ICD-311) 22)  Allergic Rhinitis  (ICD-477.9)  Current Problems (verified): 1)  Nausea  (ICD-787.02) 2)  Hip Pain, Left  (ICD-719.45) 3)  Salmonella Infection  (ICD-003.9) 4)  Cystitis, Acute  (ICD-595.0) 5)  Nonspec React Tuberculin Skn Test w/o Actv Tb  (ICD-795.5) 6)  Myalgia  (ICD-729.1) 7)  Contusion, Head  (ICD-920) 8)  Hyperglycemia, Mild  (ICD-790.29) 9)  Degenerative Disc Disease, Lumbosacral Spine  (ICD-722.52) 10)  Risk of Falling  (ICD-V15.88) 11)  Acute Sinusitis, Unspecified  (ICD-461.9) 12)  Family History Diabetes 1st Degree Relative  (ICD-V18.0) 13)  Gastric Polyp, Hx of  (ICD-V12.79) 14)  Cyst/pseudocyst, 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 1/2 Once Daily 2)  Cymbalta 60 Mg Cpep (Duloxetine Hcl) .... Once Daily 3)  Prevacid 30 Mg Cpdr (Lansoprazole) .... Two Times A Day 4)  Verapamil Hcl Cr 240 Mg Tbcr (Verapamil Hcl) .... Bid 5)  Zyrtec Allergy 10 Mg Tabs (Cetirizine Hcl) .... Once Daily 6)  Skelaxin 800 Mg  Tabs (Metaxalone) .... One By Mouth Bid 7)  Alprazolam 1 Mg Xr24h-Tab (Alprazolam) .Marland Kitchen.. 1 Once Daily 8)  Mobic 15 Mg  Tabs (Meloxicam) .... One By Mouth Daily 9)  Flonase 50 Mcg/act  Susp (Fluticasone Propionate) .... Two Sprays Q Nostril Daily 10)  Trilipix 135 Mg Cpdr (Choline Fenofibrate) .... One By Mouth Daily  Current  Medications (verified): 1)  Losartan Potassium-Hctz 100-12.5 Mg Tabs (Losartan Potassium-Hctz) .... 1/2 Once Daily 2)  Cymbalta 60 Mg Cpep (Duloxetine Hcl) .... Once Daily 3)  Prevacid 30 Mg Cpdr (Lansoprazole) .... Two Times A Day 4)  Verapamil Hcl Cr 240 Mg Tbcr (Verapamil Hcl) .... Bid 5)  Zyrtec Allergy 10 Mg Tabs (Cetirizine Hcl) .... Once Daily 6)  Skelaxin 800 Mg  Tabs (Metaxalone) .... One By Mouth Bid 7)  Alprazolam 1 Mg Xr24h-Tab (Alprazolam) .Marland Kitchen.. 1 Once Daily 8)  Mobic 15 Mg  Tabs (Meloxicam) .... One By Mouth Daily 9)  Flonase 50 Mcg/act  Susp (Fluticasone Propionate) .... Two Sprays Q Nostril Daily 10)  Trilipix 135 Mg Cpdr (Choline Fenofibrate) .... One By Mouth Daily 11)  Ampicillin 500 Mg Caps (Ampicillin) .... One By Mouth Tid  Allergies (verified): No Known Drug Allergies  Past History:  Family History: Last updated: 04/17/2007 Heart dx at advanced age in mother and father Family History Diabetes 1st degree relative  Social History: Last updated: 04/17/2007 Occupation: Married Never Smoked  Risk Factors: Smoking Status: never (05/26/2010) Passive Smoke Exposure: yes (05/26/2010)  Past medical, surgical, family and social histories (including risk factors) reviewed, and no changes noted (except as noted below).  Past Medical History: Reviewed history from 04/17/2007 and no changes required. Allergic rhinitis Depression GERD Hyperlipidemia Hypertension Osteoarthritis  Past Surgical History: Reviewed history from 09/16/2007 and no changes required. Lumbar laminectomy Lumbar fusion gastric surgery  obesity type  and HH surgeon Wenda Low  Family History: Reviewed history from 04/17/2007 and no changes required. Heart dx at advanced age in mother and father Family History Diabetes 1st degree relative  Social History: Reviewed history from 04/17/2007 and no changes required. Occupation: Married Never Smoked  Review of Systems       The  patient complains of weight gain, hoarseness, peripheral edema, and headaches.  The patient denies anorexia, fever, weight loss, vision loss, decreased hearing, chest pain, syncope, dyspnea on exertion, prolonged cough, hemoptysis, abdominal pain, melena, hematochezia, severe indigestion/heartburn, hematuria, incontinence, genital sores, muscle weakness, suspicious skin lesions, transient blindness, difficulty walking, depression, unusual weight change, abnormal bleeding, enlarged lymph nodes, angioedema, and breast masses.    Physical Exam  General:  well-nourished, well-hydrated, disheveled, and uncomfortable-appearing.   Head:  normocephalic and atraumatic.   Eyes:  pupils equal and pupils round.   Ears:  R ear normal and L ear normal.   Nose:  no external deformity and no nasal discharge.   Neck:  No deformities, masses, or tenderness noted. Chest Wall:  no deformities and no tenderness.   Lungs:  normal respiratory effort and no wheezes.   Heart:  normal rate and regular rhythm.   Abdomen:  soft, non-tender, and normal bowel sounds.   Msk:  joint tenderness and joint swelling.   Neurologic:  gait normal, DTRs symmetrical and normal, and confused.     Impression & Recommendations:  Problem # 1:  NAUSEA (ICD-787.02) the realtions shipt  to hydrocodone Discussed symptom control.   Problem # 2:  HIP PAIN, right(ICD-719.45)  Her updated medication list for this problem includes:    Skelaxin 800 Mg Tabs (Metaxalone) ..... One by mouth bid    Mobic 15 Mg Tabs (Meloxicam) ..... One by mouth daily  Discussed use of medications, application of heat or cold, and exercises.   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 1/2 once daily    Verapamil Hcl Cr  240 Mg Tbcr (Verapamil hcl) ..... Bid  BP today: 140/80 Prior BP: 160/110 (05/24/2010)  Prior 10 Yr Risk Heart Disease: 5 % (12/20/2009)  Labs Reviewed: K+: 3.5 (08/09/2009) Creat: : 0.8 (08/09/2009)   Chol: 212 (12/13/2009)   HDL: 62.60 (12/13/2009)   LDL: 99 (05/25/2009)   TG: 127.0 (12/13/2009)  Problem # 5:  CYSTITIS, ACUTE (ICD-595.0) with AMS Encouraged to push clear liquids, get enough rest, and take acetaminophen as needed. To be seen in 10 days if no improvement, sooner if worse.  Orders: Specimen Handling (34742) T-Culture, Urine (59563-87564) TLB-CBC Platelet - w/Differential (85025-CBCD) Venipuncture (33295) Rocephin  250mg  (J8841) Admin of Therapeutic Inj  intramuscular or subcutaneous (66063)  Her updated medication list for this problem includes:    Ampicillin 500 Mg Caps (Ampicillin) ..... 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 infection? both? will oniter on antibiotics asking family to watch her closely over the weekend would not repeat the prednisone  Complete Medication List: 1)  Losartan Potassium-hctz 100-12.5 Mg Tabs (Losartan potassium-hctz) .... 1/2 once daily 2)  Cymbalta 60 Mg Cpep (Duloxetine hcl) .... Once daily 3)  Prevacid 30 Mg Cpdr (Lansoprazole) .... Two times a day 4)  Verapamil Hcl Cr 240 Mg Tbcr (Verapamil hcl) .... Bid 5)  Zyrtec Allergy 10 Mg Tabs (Cetirizine hcl) .... Once daily 6)  Skelaxin 800 Mg Tabs (Metaxalone) .... One by mouth bid 7)  Alprazolam 1 Mg Xr24h-tab (Alprazolam) .Marland Kitchen.. 1 once daily 8)  Mobic 15 Mg Tabs (Meloxicam) .... One by mouth daily 9)  Flonase 50 Mcg/act Susp (Fluticasone propionate) .... Two sprays q nostril daily 10)  Trilipix 135 Mg Cpdr (Choline fenofibrate) .... One by mouth daily 11)  Ampicillin 500 Mg Caps (Ampicillin) .... One by mouth tid  Patient  Instructions: 1)  Take your antibiotic as prescribed until ALL of it is gone, but stop if you develop a rash or swelling and contact our office as soon as possible. Prescriptions: AMPICILLIN 500 MG CAPS (AMPICILLIN) one by mouth TID  #42 x 0   Entered and Authorized by:   Stacie Glaze MD   Signed by:   Stacie Glaze MD on 05/26/2010   Method used:   Electronically to        [facility] (retail)       [address]       [place], [place]  [id number]       Ph: [phone]       Fax: [phone]   RxID:   [id number]    Medication Administration  Injection # 1:    Medication: Rocephin  250mg     Diagnosis: CYSTITIS, ACUTE (  ICD-595.0)    Route: IM    Site: R deltoid    Exp Date: 01/30/2013    Lot #: [id number]    Mfr: Novartis    Comments: 1 gram given    Patient tolerated injection without complications    Given by: Willy Eddy, LPN (May 26, 2010 5:14 PM)  Orders Added: 1)  Specimen Handling [99000] 2)  T-Culture, Urine [[id number]] 3)  TLB-CBC Platelet - w/Differential [85025-CBCD] 4)  Venipuncture [14782] 5)  Rocephin  250mg  [J0696] 6)  Admin of Therapeutic Inj  intramuscular or subcutaneous [96372] 7)  Est. Patient Level IV [95621]

## 2010-10-03 NOTE — Letter (Signed)
Summary: Out of Work  Adult nurse at [employer]  [address]   [place], [place] [address]   Phone: [phone]  Fax: [phone]    May 25, 2010   Employee:  KARRISA DIDIO [employer] [facility]    To Whom It May Concern:   For Medical reasons, please excuse the above named employee from work for the following dates:  Start:   05/22/2010  End:   May Return to Work on 05/28/2010  If you need additional information, please feel free to contact our office.         Sincerely,      Evelena Peat, MD

## 2010-10-03 NOTE — Progress Notes (Signed)
Summary: change meds?  Phone Note Call from Patient Call back at [facility] Phone [phone] Call back at Work Phone [phone]   Caller: Patient Call For: [place] Summary of Call: Pt states that she has every side effect of Losartan, and thinks the med needs to be changed.  Itchy throat today with all the same symptoms from yesterday. Initial call taken by: Lynann Beaver CMA,  May 25, 2010 3:40 PM  Follow-up for Phone Call        Pt calls back stating she is really worried about this reaction she is having, and how ill she has been.   Follow-up by: Lynann Beaver CMA,  May 25, 2010 4:17 PM  Additional Follow-up for Phone Call Additional follow up Details #1::        This is Dr York Ram pt and I would defer chronic med decision to him.  She needs follow up with him soon to discuss. Additional Follow-up by: Evelena Peat MD,  May 25, 2010 6:03 PM    Additional Follow-up for Phone Call Additional follow up Details #2::    appointment today-given Follow-up by: Stacie Glaze MD,  May 26, 2010 8:30 AM

## 2010-10-03 NOTE — Assessment & Plan Note (Signed)
Summary: 3 month rov/njr   Vital Signs:  Patient profile:   70 year old female Height:      66 inches Weight:      200 pounds BMI:     32.40 Temp:     99.1 degrees F oral Pulse rate:   72 / minute Pulse rhythm:   regular Resp:     14 per minute BP sitting:   144 / 90  (left arm)  Vitals Entered By: Willy Eddy, LPN (June 20, 2010 2:08 PM) CC: roa-  Is Patient Diabetic? No   Primary Care Provider:  Stacie Glaze MD  CC:  roa- .  History of Present Illness: THE PT has been on skelaxan for muscle relaxation three times a day from Dr Noel Gerold and taking the same drug  that we had goiven her ( she did not recognize that they were the same and they came from two different pharmacies. 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) 3)  Nausea  (ICD-787.02) 4)  Hip Pain, Left  (ICD-719.45) 5)  Salmonella Infection  (ICD-003.9) 6)  Cystitis, Acute  (ICD-595.0) 7)  Nonspec React Tuberculin Skn Test w/o Actv Tb  (ICD-795.5) 8)  Myalgia  (ICD-729.1) 9)  Contusion, Head  (ICD-920) 10)  Hyperglycemia, Mild  (ICD-790.29) 11)  Degenerative Disc Disease, Lumbosacral Spine  (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) 12)  Risk of Falling  (ICD-V15.88) 13)  Acute Sinusitis, Unspecified  (ICD-461.9) 14)  Family History Diabetes 1st Degree Relative  (ICD-V18.0) 15)  Gastric Polyp, Hx of  (ICD-V12.79) 16)  Cyst/pseudocyst, 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) 17)  Osteoarthritis  (ICD-715.90) 18)  Hypertension  (ICD-401.9) 19)  Hyperlipidemia   (ICD-272.4) 20)  Gerd  (ICD-530.81) 21)  Depression  (ICD-311) 22)  Allergic Rhinitis  (ICD-477.9)  Current Problems (verified): 1)  Altered Mental Status  (ICD-780.97) 2)  Spondylosis, Lumbar, 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) 3)  Nausea  (ICD-787.02) 4)  Hip Pain, Left  (ICD-719.45) 5)  Salmonella Infection  (ICD-003.9) 6)  Cystitis, Acute  (ICD-595.0) 7)  Nonspec React Tuberculin Skn Test w/o Actv Tb  (ICD-795.5) 8)  Myalgia  (ICD-729.1) 9)  Contusion, Head  (ICD-920) 10)  Hyperglycemia, Mild  (ICD-790.29) 11)  Degenerative Disc Disease, Lumbosacral Spine  (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 1/2 Once Daily 2)  Cymbalta 60 Mg Cpep (Duloxetine Hcl) .... Once Daily 3)  Prevacid 30 Mg Cpdr (Lansoprazole) .... Two Times A Day 4)  Verapamil Hcl Cr 240 Mg Tbcr (Verapamil Hcl) .... Bid 5)  Zyrtec Allergy 10 Mg Tabs (Cetirizine Hcl) .Marland KitchenMarland KitchenMarland Kitchen  Once Daily 6)  Skelaxin 800 Mg  Tabs (Metaxalone) .... One By Mouth Bid 7)  Alprazolam 1 Mg Xr24h-Tab (Alprazolam) .Marland Kitchen.. 1 Once Daily 8)  Mobic 15 Mg  Tabs (Meloxicam) .... One By Mouth Daily 9)  Flonase 50 Mcg/act  Susp (Fluticasone Propionate) .... Two Sprays Q Nostril Daily 10)  Trilipix 135 Mg Cpdr (Choline Fenofibrate) .... One By Mouth Daily 11)  Ampicillin 500 Mg Caps (Ampicillin) .... One By Mouth Tid  Current Medications (verified): 1)  Losartan Potassium-Hctz 100-12.5 Mg Tabs (Losartan Potassium-Hctz) .... 1/2 Once Daily 2)  Cymbalta 60 Mg Cpep  (Duloxetine Hcl) .... Once Daily 3)  Prevacid 30 Mg Cpdr (Lansoprazole) .... Two Times A Day 4)  Verapamil Hcl Cr 240 Mg Tbcr (Verapamil Hcl) .... Bid 5)  Zyrtec Allergy 10 Mg Tabs (Cetirizine Hcl) .... Once Daily 6)  Metaxalone 800 Mg Tabs (Metaxalone) .... One By Mouth Three Times A Day ( Skelaxin) 7)  Alprazolam 1 Mg Xr24h-Tab (Alprazolam) .Marland Kitchen.. 1 Once Daily 8)  Mobic 15 Mg  Tabs (Meloxicam) .... One By Mouth Daily 9)  Flonase 50 Mcg/act  Susp (Fluticasone Propionate) .... Two Sprays Q Nostril Daily 10)  Trilipix 135 Mg Cpdr (Choline Fenofibrate) .... One By Mouth Daily 11)  Tramadol Hcl 50 Mg Tabs (Tramadol Hcl) .... One To Two  By Mouth Three Times A Day  Allergies (verified): No Known Drug Allergies  Past History:  Family History: Last updated: 04/17/2007 Heart dx at advanced age in mother and father Family History Diabetes 1st degree relative  Social History: Last updated: 04/17/2007 Occupation: Married Never Smoked  Risk Factors: Smoking Status: never (06/20/2010) Passive Smoke Exposure: yes (06/20/2010)  Past medical, surgical, family and social histories (including risk factors) reviewed, and no changes noted (except as noted below).  Past Medical History: Reviewed history from 04/17/2007 and no changes required. Allergic rhinitis Depression GERD Hyperlipidemia Hypertension Osteoarthritis  Past Surgical History: Reviewed history from 09/16/2007 and no changes required. Lumbar laminectomy Lumbar fusion gastric surgery  obesity type  and HH surgeon Wenda Low  Family History: Reviewed history from 04/17/2007 and no changes required. Heart dx at advanced age in mother and father Family History Diabetes 1st degree relative  Social History: Reviewed history from 04/17/2007 and no changes required. Occupation: Married Never Smoked  Review of Systems  The patient denies anorexia, fever, weight loss, weight gain, vision loss, decreased hearing,  hoarseness, chest pain, syncope, dyspnea on exertion, peripheral edema, prolonged cough, headaches, hemoptysis, abdominal pain, melena, hematochezia, severe indigestion/heartburn, hematuria, incontinence, genital sores, muscle weakness, suspicious skin lesions, transient blindness, difficulty walking, depression, unusual weight change, abnormal bleeding, enlarged lymph nodes, angioedema, and breast masses.    Physical Exam  General:  well-nourished, well-hydrated, disheveled, and uncomfortable-appearing.   Head:  normocephalic and atraumatic.   Eyes:  pupils equal and pupils round.   Ears:  R ear normal and L ear normal.   Nose:  no external deformity and no nasal discharge.   Mouth:  Oral mucosa and oropharynx without lesions or exudates.  Teeth in good repair. Neck:  No deformities, masses, or tenderness noted. Lungs:  normal respiratory effort and no wheezes.   Heart:  normal rate and regular rhythm.   Abdomen:  soft, non-tender, and normal bowel sounds.   Msk:  joint tenderness and joint swelling.   Pulses:  R and L carotid,radial,femoral,dorsalis pedis and posterior tibial pulses are full and equal bilaterally Extremities:  full ROM both hips.  No pain with internal/external rotation.  Upper extremity exam normal. Neurologic:  gait normal, DTRs symmetrical and normal, and confused.     Impression & Recommendations:  Problem # 1:  ALTERED MENTAL STATUS (ICD-780.97) possible infections  possible drug over use  Problem # 2:  CYSTITIS, ACUTE (ICD-595.0) cleared The following medications were removed from the medication list:    Ampicillin 500 Mg Caps (Ampicillin) ..... One by mouth tid  Orders: UA Dipstick W/ Micro (manual) (04540)  Problem # 3:  RISK OF FALLING (ICD-V15.88) due to drugs!  Problem # 4:  HYPERTENSION (ICD-401.9)  Her updated medication list for this problem includes:    Losartan Potassium-hctz 100-12.5 Mg Tabs (Losartan potassium-hctz) .Marland Kitchen... 1/2 once daily     Verapamil Hcl Cr 240 Mg Tbcr (Verapamil hcl) ..... Bid  BP today: 144/90 Prior BP: 140/80 (05/26/2010)  Prior 10 Yr Risk Heart Disease: 5 % (12/20/2009)  Labs Reviewed: K+: 3.5 (08/09/2009) Creat: : 0.8 (08/09/2009)   Chol: 212 (12/13/2009)   HDL: 62.60 (12/13/2009)   LDL: 99 (05/25/2009)   TG: 127.0 (12/13/2009)  Problem # 5:  DEGENERATIVE DISC DISEASE, LUMBOSACRAL SPINE (ICD-722.52) Increase the ultram to 2 pp TID  Problem # 6:  URI (ICD-465.9)  viral URI allerx samples given  Her updated medication list for this problem includes:    Zyrtec Allergy 10 Mg Tabs (Cetirizine hcl) ..... Once daily    Mobic 15 Mg Tabs (Meloxicam) ..... One by mouth daily  Encouraged to push clear liquids, get enough rest, and take acetaminophen as needed. To be seen in 10 days if no improvement, sooner if worse.  Instructed on symptomatic treatment. Call if symptoms persist or worsen.   Complete Medication List: 1)  Losartan Potassium-hctz 100-12.5 Mg Tabs (Losartan potassium-hctz) .... 1/2 once daily 2)  Cymbalta 60 Mg Cpep (Duloxetine hcl) .... Once daily 3)  Prevacid 30 Mg Cpdr (Lansoprazole) .... Two times a day 4)  Verapamil Hcl Cr 240 Mg Tbcr (Verapamil hcl) .... Bid 5)  Zyrtec Allergy 10 Mg Tabs (Cetirizine hcl) .... Once daily 6)  Metaxalone 800 Mg Tabs (Metaxalone) .... One by mouth three times a day ( skelaxin) 7)  Alprazolam 1 Mg Xr24h-tab (Alprazolam) .Marland Kitchen.. 1 once daily 8)  Mobic 15 Mg Tabs (Meloxicam) .... One by mouth daily 9)  Flonase 50 Mcg/act Susp (Fluticasone propionate) .... Two sprays q nostril daily 10)  Trilipix 135 Mg Cpdr (Choline fenofibrate) .... One by mouth daily 11)  Tramadol Hcl 50 Mg Tabs (Tramadol hcl) .... One to two  by mouth three times a day  Other Orders: T-Culture, Urine ([id number])  Patient Instructions: 1)  with the risk of falling high and the current amount of pain will not increased blood pressure medications 2)  Please schedule a follow-up  appointment in 2 months. Prescriptions: TRAMADOL HCL 50 MG TABS (TRAMADOL HCL) one to two  by mouth three times a day  #120 x 0   Entered and Authorized by:   Stacie Glaze MD   Signed by:   Stacie Glaze MD on 06/20/2010   Method used:   Electronically to        [facility]* (retail)       [address]       [place], [place]  [id number]       Ph: [phone]       Fax: [phone]   RxID:   [id number]    Orders Added: 1)  UA Dipstick W/ Micro (manual) [81000] 2)  T-Culture, Urine [[id number]]  3)  Est. Patient Level IV [04540]    Laboratory Results   Urine Tests    Routine Urinalysis   Color: yellow Appearance: Clear Glucose: negative   (Normal Range: Negative) Bilirubin: 1+   (Normal Range: Negative) Ketone: 1+   (Normal Range: Negative) Spec. Gravity: 1.025   (Normal Range: 1.003-1.035) Blood: negative   (Normal Range: Negative) pH: 5.5   (Normal Range: 5.0-8.0) Protein: negative   (Normal Range: Negative) Urobilinogen: 0.2   (Normal Range: 0-1) Nitrite: negative   (Normal Range: Negative) Leukocyte Esterace: negative   (Normal Range: Negative)    Comments: Rita Ohara  June 20, 2010 2:15 PM      Appended Document: Orders Update     Clinical Lists Changes  Orders: Added new Test order of T-Culture, Urine ([id number]) - Signed

## 2010-10-03 NOTE — Assessment & Plan Note (Signed)
Summary: 3 month rov/njr   Vital Signs:  Patient profile:   70 year old female Height:      66 inches Weight:      216 pounds BMI:     34.99 Temp:     98.9 degrees F oral Pulse rate:   76 / minute Resp:     14 per minute BP sitting:   140 / 84  (left arm) Cuff size:   large  Vitals Entered By: Willy Eddy, LPN (March 21, 2010 11:24 AM) CC: ROA-TO UROLOGIST ON FRIDAY ABOUT UTI- SEEING DR FOR RETINAL PROBLEMS   CC:  ROA-TO UROLOGIST ON FRIDAY ABOUT UTI- SEEING DR FOR RETINAL PROBLEMS.  History of Present Illness: Has an apointment with the urologist She Dr Caryl Never for burning and  obtained a specimin with the "salmonella species" this is the first UTI this year she is concerned that she does have symptoms  wil repeat the UA if clean she will cancel the appointment  has been seeing  a retinal specialist ( dr Daphine Deutscher) pt was diagnosed with a "clot" in the eye  Preventive Screening-Counseling & Management  Alcohol-Tobacco     Smoking Status: never     Passive Smoke Exposure: yes  Problems Prior to Update: 1)  Salmonella Infection  (ICD-003.9) 2)  Cystitis, Acute  (ICD-595.0) 3)  Nonspec React Tuberculin Skn Test w/o Actv Tb  (ICD-795.5) 4)  Myalgia  (ICD-729.1) 5)  Contusion, Head  (ICD-920) 6)  Hyperglycemia, Mild  (ICD-790.29) 7)  Degenerative Disc Disease, Lumbosacral Spine  (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1) 5)  Contusion, Head  (ICD-920) 6)  Hyperglycemia, Mild  (ICD-790.29) 7)  Degenerative Disc Disease, Lumbosacral Spine  (ICD-722.52) 8)  Risk of Falling  (ICD-V15.88) 9)  Acute Sinusitis, Unspecified  (ICD-461.9) 10)  Family History Diabetes 1st Degree Relative  (ICD-V18.0) 11)  Gastric Polyp, Hx of  (ICD-V12.79) 12)  Cyst/pseudocyst, 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 1/2 Once Daily 2)  Cymbalta 60 Mg Cpep (Duloxetine Hcl) .... Once Daily 3)  Prevacid 30 Mg Cpdr (Lansoprazole) .... Two Times A Day 4)  Verapamil Hcl Cr 240 Mg Tbcr (Verapamil Hcl) .... Bid 5)  Zyrtec Allergy 10 Mg Tabs (Cetirizine Hcl) .... Once Daily 6)  Skelaxin 800 Mg  Tabs (Metaxalone) .... One By Mouth Bid 7)  Alprazolam 1 Mg Xr24h-Tab (Alprazolam) .Marland Kitchen.. 1 Once Daily 8)  Ambien Cr 12.5 Mg Cr-Tabs (Zolpidem Tartrate) .Marland Kitchen.. 1 At Bedtime As Needed Sleep 9)  Mobic 15 Mg  Tabs (Meloxicam) .... One By Mouth Daily 10)  Flonase 50 Mcg/act  Susp (Fluticasone Propionate) .... Two Sprays Q Nostril Daily 11)  Trilipix 135 Mg Cpdr (Choline  Fenofibrate) .... One By Mouth Daily 12)  Ciprofloxacin Hcl 500 Mg Tabs (Ciprofloxacin Hcl) .... One By Mouth Two Times A Day For 7 Days  Current Medications (verified): 1)  Losartan Potassium-Hctz 100-12.5 Mg Tabs (Losartan Potassium-Hctz) .... 1/2 Once Daily 2)  Cymbalta 60 Mg Cpep (Duloxetine Hcl) .... Once Daily 3)  Prevacid 30 Mg Cpdr (Lansoprazole) .... Two Times A Day 4)  Verapamil Hcl Cr 240 Mg Tbcr (Verapamil Hcl) .... Bid 5)  Zyrtec Allergy 10 Mg Tabs (Cetirizine Hcl) .... Once Daily 6)  Skelaxin 800 Mg  Tabs (Metaxalone) .... One By Mouth Bid 7)  Alprazolam 1 Mg  Xr24h-Tab (Alprazolam) .Marland Kitchen.. 1 Once Daily 8)  Mobic 15 Mg  Tabs (Meloxicam) .... One By Mouth Daily 9)  Flonase 50 Mcg/act  Susp (Fluticasone Propionate) .... Two Sprays Q Nostril Daily 10)  Trilipix 135 Mg Cpdr (Choline Fenofibrate) .... One By Mouth Daily  Allergies (verified): No Known Drug Allergies  Past History:  Family History: Last updated: 04/17/2007 Heart dx at advanced age in mother and father Family History Diabetes 1st degree relative  Social History: Last updated: 04/17/2007 Occupation: Married Never Smoked  Risk Factors: Smoking Status: never (03/21/2010) Passive Smoke Exposure: yes (03/21/2010)  Past medical, surgical, family and social histories (including risk factors) reviewed, and no changes noted (except as noted below).  Past Medical History: Reviewed history from 04/17/2007 and no changes required. Allergic rhinitis Depression GERD Hyperlipidemia Hypertension Osteoarthritis  Past Surgical History: Reviewed history from 09/16/2007 and no changes required. Lumbar laminectomy Lumbar fusion gastric surgery  obesity type  and HH surgeon Wenda Low  Family History: Reviewed history from 04/17/2007 and no changes required. Heart dx at advanced age in mother and father Family History Diabetes 1st degree relative  Social History: Reviewed history from 04/17/2007 and no changes required. Occupation: Married Never Smoked  Review of Systems  The patient denies anorexia, fever, weight loss, weight gain, vision loss, decreased hearing, hoarseness, chest pain, syncope, dyspnea on exertion, peripheral edema, prolonged cough, headaches, hemoptysis, abdominal pain, hematochezia, hematuria, incontinence, genital sores, muscle weakness, suspicious skin lesions, transient blindness, difficulty walking, depression, unusual weight change, abnormal bleeding, enlarged lymph nodes, angioedema, breast masses, and testicular masses.    Physical Exam  General:   Well-developed,well-nourished,in no acute distress; alert,appropriate and cooperative throughout examination Eyes:  pupils equal and pupils round.   Ears:  R ear normal and L ear normal.   Nose:  no external deformity and no nasal discharge.   Mouth:  no gingival abnormalities and pharynx pink and moist.   Neck:  No deformities, masses, or tenderness noted. Lungs:  Normal respiratory effort, chest expands symmetrically. Lungs are clear to auscultation, no crackles or wheezes. Heart:  normal rate and regular rhythm.   Abdomen:  Bowel sounds positive,abdomen soft and non-tender without masses, organomegaly or hernias noted. Msk:  No deformity or scoliosis noted of thoracic or lumbar spine.  joint tenderness.   Extremities:  full ROM both hips.  No pain with internal/external rotation.  Upper extremity exam normal. Neurologic:  alert & oriented X3, cranial nerves II-XII intact, and strength normal in all extremities.     Impression & Recommendations:  Problem # 1:  SALMONELLA INFECTION (ICD-003.9)  will repeat the UA to see if this was contamination of real if urine clear may consisder cancelling the urology visit  Orders: Specimen Handling (65784) T-Culture, Urine ([id number])  Problem # 2:  HYPERTENSION (ICD-401.9)  Her updated medication list for this problem includes:  Losartan Potassium-hctz 100-12.5 Mg Tabs (Losartan potassium-hctz) .Marland Kitchen... 1/2 once daily    Verapamil Hcl Cr 240 Mg Tbcr (Verapamil hcl) ..... Bid  BP today: 140/84 Prior BP: 150/90 (03/09/2010)  Prior 10 Yr Risk Heart Disease: 5 % (12/20/2009)  Labs Reviewed: K+: 3.5 (08/09/2009) Creat: : 0.8 (08/09/2009)   Chol: 212 (12/13/2009)   HDL: 62.60 (12/13/2009)   LDL: 99 (05/25/2009)   TG: 127.0 (12/13/2009)  Problem # 3:  GERD (ICD-530.81)  Her updated medication list for this problem includes:    Prevacid 30 Mg Cpdr (Lansoprazole) .Marland Kitchen..Marland Kitchen Two times a day  Problem # 4:  HYPERGLYCEMIA, MILD (ICD-790.29)  Labs  Reviewed: Creat: 0.8 (08/09/2009)     Complete Medication List: 1)  Losartan Potassium-hctz 100-12.5 Mg Tabs (Losartan potassium-hctz) .... 1/2 once daily 2)  Cymbalta 60 Mg Cpep (Duloxetine hcl) .... Once daily 3)  Prevacid 30 Mg Cpdr (Lansoprazole) .... Two times a day 4)  Verapamil Hcl Cr 240 Mg Tbcr (Verapamil hcl) .... Bid 5)  Zyrtec Allergy 10 Mg Tabs (Cetirizine hcl) .... Once daily 6)  Skelaxin 800 Mg Tabs (Metaxalone) .... One by mouth bid 7)  Alprazolam 1 Mg Xr24h-tab (Alprazolam) .Marland Kitchen.. 1 once daily 8)  Mobic 15 Mg Tabs (Meloxicam) .... One by mouth daily 9)  Flonase 50 Mcg/act Susp (Fluticasone propionate) .... Two sprays q nostril daily 10)  Trilipix 135 Mg Cpdr (Choline fenofibrate) .... One by mouth daily  Patient Instructions: 1)  add an 81 mg aspirin daily 2)  Please schedule a follow-up appointment in 3 months.

## 2010-10-03 NOTE — Letter (Signed)
Summary: Generic Letter  [place] at [facility]  [address] [place], [place] [address]   Phone: [phone]  Fax: [phone]    05/30/2010  [facility] [address] [place], [place]  [address]    Dr. Noel Gerold:  Ms Tyrell may have injection per Dr. Lovell Sheehan.        Sincerely,   Dr. Norva Riffle

## 2010-10-03 NOTE — Letter (Signed)
Summary: Generic Letter  [place] at [facility]  [address] [place], [place] [address]   Phone: [phone]  Fax: [phone]    05/30/2010  [facility] [address] [place], [place]  [address]  Dr Ethelene Hal:  May have back injection per Dr Lovell Sheehan.     Sincerely,   Dr.Jenkins  Appended Document: Generic Letter this was sent to the wrong md inerror- I called [facility] and they are going to pull this fax and shred it. I spoke with [place]

## 2010-10-03 NOTE — Progress Notes (Signed)
Summary: chest xray  Phone Note Call from Patient   Caller: Patient Call For: Stacie Glaze MD Reason for Call: Insurance Question Summary of Call: Pt is requesting order for chest xray for employment purposes that has to be done within 24 hours.  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  Order sent. Initial call taken by: Lynann Beaver CMA,  October 19, 2009 2:18 PM  Follow-up for Phone Call        talked wil pt today and told her after 3 cxr the human resources should have fomr to completed for tb testing and she shouldnt be getting    cxr yearly Follow-up by: Willy Eddy, LPN,  October 20, 2009 7:58 AM  New Problems: NONSPEC REACT TUBERCULIN SKN TEST W/O ACTV TB (ICD-795.5)   New Problems: NONSPEC REACT TUBERCULIN SKN TEST W/O ACTV TB (ICD-795.5)

## 2010-10-03 NOTE — Progress Notes (Signed)
Summary: letter of approval  Phone Note Call from Patient Call back at Home Phone [phone]   Caller: vm Summary of Call: Letter of approval to go to [facility] where I'll get shot to fax [phone].  Call me if questions.   Initial call taken by: Rudy Jew, RN,  May 31, 2010 1:24 PM  Follow-up for Phone Call        was faxed yesterday  Follow-up by: Willy Eddy, LPN,  May 31, 2010 1:31 PM

## 2010-10-05 NOTE — Assessment & Plan Note (Signed)
Summary: 2 MONTH ROV/NJR/pt rsc/cjr   Vital Signs:  Patient profile:   70 year old female Height:      66 inches Weight:      206 pounds BMI:     33.37 Temp:     99.0 degrees F oral Pulse rate:   72 / minute Resp:     14 per minute BP sitting:   140 / 80  (left arm)  Vitals Entered By: Willy Eddy, LPN (September 26, 2010 11:53 AM) CC: roa, Hypertension Management Is Patient Diabetic? No   Primary Care Provider:  Stacie Glaze MD  CC:  roa and Hypertension Management.  History of Present Illness:  patient presents for followup of hyperlipidemia hypertension and sleep apnea.  The patient states that she is using her CPAP machine SMA regular basis.   Daytime somnolence seems to have improved.  the patient's cognition appears to have improved but she does have some episodes in which [place]. Mini mental status exam is normal but depression scale is higer.  Hypertension History:      She denies headache, chest pain, palpitations, dyspnea with exertion, orthopnea, PND, peripheral edema, visual symptoms, neurologic problems, syncope, and side effects from treatment.        Positive major cardiovascular risk factors include female age 1 years old or older, hyperlipidemia, and hypertension.  Negative major cardiovascular risk factors include no history of diabetes, negative family history for ischemic heart disease, and non-tobacco-user status.        Further assessment for target organ damage reveals no history of ASHD, stroke/TIA, or peripheral vascular disease.     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) 4)  Nausea  (ICD-787.02) 5)  Hip Pain, Left   (ICD-719.45) 6)  Salmonella Infection  (ICD-003.9) 7)  Cystitis, Acute  (ICD-595.0) 8)  Nonspec React Tuberculin Skn Test w/o Actv Tb  (ICD-795.5) 9)  Myalgia  (ICD-729.1) 10)  Contusion, Head  (ICD-920) 11)  Hyperglycemia, Mild  (ICD-790.29) 12)  Degenerative Disc Disease, Lumbosacral Spine  (ICD-722.52) 13)  Risk of Falling  (ICD-V15.88) 14)  Acute Sinusitis, Unspecified  (ICD-461.9) 15)  Family History Diabetes 1st Degree Relative  (ICD-V18.0) 16)  Gastric Polyp, Hx of  (ICD-V12.79) 17)  Cyst/pseudocyst, 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 1/2 Once Daily 2)  Cymbalta 60 Mg Cpep (Duloxetine Hcl) .... Once Daily 3)  Prevacid 30 Mg Cpdr (Lansoprazole) .... Two Times A Day 4)  Verapamil Hcl Cr 240 Mg Tbcr (Verapamil Hcl) .... Bid 5)  Zyrtec Allergy 10 Mg Tabs (Cetirizine Hcl) .... Once Daily 6)  Metaxalone 800 Mg Tabs (Metaxalone) .... One By Mouth Three Times A Day ( Skelaxin) 7)  Alprazolam 1 Mg Xr24h-Tab (Alprazolam) .Marland Kitchen.. 1 Once Daily 8)  Mobic 15 Mg  Tabs (Meloxicam) .... One By Mouth Daily 9)  Flonase 50 Mcg/act  Susp (Fluticasone Propionate) .... Two Sprays Q Nostril Daily 10)  Trilipix 135 Mg Cpdr (  Choline Fenofibrate) .... One By Mouth Daily 11)  Tramadol Hcl 50 Mg Tabs (Tramadol Hcl) .... One To Two  By Mouth Three Times A Day  Allergies (verified): No Known Drug Allergies  Past History:  Family History: Last updated: 04/17/2007 Heart dx at advanced age in mother and father Family History Diabetes 1st degree relative  Social History: Last updated: 04/17/2007 Occupation: Married Never Smoked  Risk Factors: Smoking Status: never (09/26/2010) Passive Smoke Exposure: yes  (09/26/2010)  Past medical, surgical, family and social histories (including risk factors) reviewed, and no changes noted (except as noted below).  Past Medical History: Reviewed history from 04/17/2007 and no changes required. Allergic rhinitis Depression GERD Hyperlipidemia Hypertension Osteoarthritis  Past Surgical History: Reviewed history from 09/16/2007 and no changes required. Lumbar laminectomy Lumbar fusion gastric surgery  obesity type  and HH surgeon Wenda Low  Family History: Reviewed history from 04/17/2007 and no changes required. Heart dx at advanced age in mother and father Family History Diabetes 1st degree relative  Social History: Reviewed history from 04/17/2007 and no changes required. Occupation: Married Never Smoked  Review of Systems  The patient denies anorexia, fever, weight loss, weight gain, vision loss, decreased hearing, hoarseness, chest pain, syncope, dyspnea on exertion, peripheral edema, prolonged cough, headaches, hemoptysis, abdominal pain, melena, hematochezia, severe indigestion/heartburn, hematuria, incontinence, genital sores, muscle weakness, suspicious skin lesions, transient blindness, difficulty walking, depression, unusual weight change, abnormal bleeding, enlarged lymph nodes, angioedema, and breast masses.    Physical Exam  General:  well-nourished, well-hydrated, disheveled, and uncomfortable-appearing.   Head:  normocephalic and atraumatic.   Eyes:  pupils equal and pupils round.   Ears:  R ear normal and L ear normal.   Nose:  no external deformity and no nasal discharge.   Mouth:  Oral mucosa and oropharynx without lesions or exudates.  Teeth in good repair. Neck:  No deformities, masses, or tenderness noted. Lungs:  normal respiratory effort and no wheezes.   Heart:  normal rate and regular rhythm.   Abdomen:  soft, non-tender, and normal bowel sounds.   Msk:  joint tenderness and joint swelling.   Pulses:  R and L  carotid,radial,femoral,dorsalis pedis and posterior tibial pulses are full and equal bilaterally Extremities:  full ROM both hips.  No pain with internal/external rotation.  Upper extremity exam normal. Neurologic:  gait normal, DTRs symmetrical and normal, and confused.   Skin:  no rashes.   Cervical Nodes:  No lymphadenopathy noted   Impression & Recommendations:  Problem # 1:  CONTUSION, HEAD (ICD-920)  Problem # 2:  HYPERTENSION (ICD-401.9)  Her updated medication list for this problem includes:    Losartan Potassium-hctz 100-12.5 Mg Tabs (Losartan potassium-hctz) .Marland Kitchen... 1/2 once daily    Verapamil Hcl Cr 240 Mg Tbcr (Verapamil hcl) ..... Bid  BP today: 140/80 Prior BP: 144/90 (06/20/2010)  10 Yr Risk Heart Disease: 7 % Prior 10 Yr Risk Heart Disease: 5 % (12/20/2009)  Labs Reviewed: K+: 3.5 (08/09/2009) Creat: : 0.8 (08/09/2009)   Chol: 212 (12/13/2009)   HDL: 62.60 (12/13/2009)   LDL: 99 (05/25/2009)   TG: 127.0 (12/13/2009)  Problem # 3:  OSTEOARTHRITIS (ICD-715.90)  Her updated medication list for this problem includes:    Mobic 15 Mg Tabs (Meloxicam) ..... One by mouth daily    Tramadol Hcl 50 Mg Tabs (Tramadol hcl) ..... One to two  by mouth three times a day  Discussed use of medications, application of heat or cold, and exercises.   Problem #  4:  HYPERLIPIDEMIA (ICD-272.4) Assessment: Unchanged  Her updated medication list for this problem includes:    Trilipix 135 Mg Cpdr (Choline fenofibrate) ..... One by mouth daily  Complete Medication List: 1)  Losartan Potassium-hctz 100-12.5 Mg Tabs (Losartan potassium-hctz) .... 1/2 once daily 2)  Cymbalta 60 Mg Cpep (Duloxetine hcl) .... Once daily 3)  Prevacid 30 Mg Cpdr (Lansoprazole) .... Two times a day 4)  Verapamil Hcl Cr 240 Mg Tbcr (Verapamil hcl) .... Bid 5)  Zyrtec Allergy 10 Mg Tabs (Cetirizine hcl) .... Once daily 6)  Metaxalone 800 Mg Tabs (Metaxalone) .... One by mouth three times a day ( skelaxin) 7)   Alprazolam 1 Mg Xr24h-tab (Alprazolam) .Marland Kitchen.. 1 once daily 8)  Mobic 15 Mg Tabs (Meloxicam) .... One by mouth daily 9)  Flonase 50 Mcg/act Susp (Fluticasone propionate) .... Two sprays q nostril daily 10)  Trilipix 135 Mg Cpdr (Choline fenofibrate) .... One by mouth daily 11)  Tramadol Hcl 50 Mg Tabs (Tramadol hcl) .... One to two  by mouth three times a day  Hypertension Assessment/Plan:      The patient's hypertensive risk group is category B: At least one risk factor (excluding diabetes) with no target organ damage.  Her calculated 10 year risk of coronary heart disease is 7 %.  Today's blood pressure is 140/80.  Her blood pressure goal is < 140/90.  Patient Instructions: 1)  Please schedule a follow-up appointment in 3 months.   Orders Added: 1)  Est. Patient Level IV [91478]

## 2010-10-13 ENCOUNTER — Other Ambulatory Visit: Payer: Self-pay | Admitting: Internal Medicine

## 2010-11-08 ENCOUNTER — Other Ambulatory Visit: Payer: Self-pay | Admitting: Internal Medicine

## 2010-12-15 ENCOUNTER — Encounter: Payer: Self-pay | Admitting: Internal Medicine

## 2010-12-22 ENCOUNTER — Other Ambulatory Visit: Payer: Self-pay | Admitting: Internal Medicine

## 2010-12-26 ENCOUNTER — Ambulatory Visit (INDEPENDENT_AMBULATORY_CARE_PROVIDER_SITE_OTHER): Payer: Medicare Other | Admitting: Internal Medicine

## 2010-12-26 ENCOUNTER — Encounter: Payer: Self-pay | Admitting: Internal Medicine

## 2010-12-26 VITALS — BP 144/80 | HR 76 | Temp 98.2°F | Resp 16 | Ht 63.0 in | Wt 200.0 lb

## 2010-12-26 DIAGNOSIS — M5137 Other intervertebral disc degeneration, lumbosacral region: Secondary | ICD-10-CM

## 2010-12-26 DIAGNOSIS — F329 Major depressive disorder, single episode, unspecified: Secondary | ICD-10-CM

## 2010-12-26 DIAGNOSIS — E785 Hyperlipidemia, unspecified: Secondary | ICD-10-CM

## 2010-12-26 DIAGNOSIS — I10 Essential (primary) hypertension: Secondary | ICD-10-CM

## 2010-12-26 NOTE — Assessment & Plan Note (Signed)
Patient has hyperlipidemia stable on current medications we will give her samples of her cholesterol drug

## 2010-12-26 NOTE — Assessment & Plan Note (Signed)
Continue her on her current dose of her antidepressant

## 2010-12-26 NOTE — Progress Notes (Signed)
  Subjective:    Patient ID: [name], female    DOB: [birth year], 70 y.o.   MRN: [id number]  HPI Patient presents for follow up hypertension hyperlipidemia and depression.  She is stable and her current medications of Cymbalta 60 mg once a day her blood pressure is stable and her current medications although slightly elevated today because she was up, working third shift.  Her cholesterol is well-controlled with her Trileptal 6 for review of her need for exercise   Review of Systems  Constitutional: Negative for activity change, appetite change and fatigue.  HENT: Negative for ear pain, congestion, neck pain, postnasal drip and sinus pressure.   Eyes: Negative for redness and visual disturbance.  Respiratory: Negative for cough, shortness of breath and wheezing.   Gastrointestinal: Negative for abdominal pain and abdominal distention.  Genitourinary: Negative for dysuria, frequency and menstrual problem.  Musculoskeletal: Negative for myalgias, joint swelling and arthralgias.  Skin: Negative for rash and wound.  Neurological: Negative for dizziness, weakness and headaches.  Hematological: Negative for adenopathy. Does not bruise/bleed easily.  Psychiatric/Behavioral: Negative for sleep disturbance and decreased concentration.   Past Medical History  Diagnosis Date  . Allergy   . Depression   . GERD (gastroesophageal reflux disease)   . Hyperlipidemia   . Hypertension   . Arthritis    Past Surgical History  Procedure Date  . Lumbar laminectomy   . Lumbar fusion   . Gastric surgeryobesity type and hh surgeon matt martin     reports that she has never smoked. She does not have any smokeless tobacco history on file. She reports that she does not drink alcohol or use illicit drugs. family history includes Diabetes in her paternal grandfather and Heart disease in her father and mother. No Known Allergies     Objective:   Physical Exam  Constitutional: She is oriented  to person, place, and time. She appears well-developed and well-nourished. No distress.  HENT:  Head: Normocephalic and atraumatic.  Right Ear: External ear normal.  Left Ear: External ear normal.  Nose: Nose normal.  Mouth/Throat: Oropharynx is clear and moist.  Eyes: Conjunctivae and EOM are normal. Pupils are equal, round, and reactive to light.  Neck: Normal range of motion. Neck supple. No JVD present. No tracheal deviation present. No thyromegaly present.  Cardiovascular: Normal rate, regular rhythm, normal heart sounds and intact distal pulses.   No murmur heard. Pulmonary/Chest: Effort normal and breath sounds normal. She has no wheezes. She exhibits no tenderness.  Abdominal: Soft. Bowel sounds are normal.  Musculoskeletal: Normal range of motion. She exhibits no edema and no tenderness.  Lymphadenopathy:    She has no cervical adenopathy.  Neurological: She is alert and oriented to person, place, and time. She has normal reflexes. No cranial nerve deficit.  Skin: Skin is warm and dry. She is not diaphoretic.  Psychiatric: She has a normal mood and affect. Her behavior is normal.          Assessment & Plan:

## 2010-12-26 NOTE — Assessment & Plan Note (Signed)
Has been able to reduce her dose of tramadol for back pain intermittently she does work too many hours which puts stress on her back and probably needs to moderate as ours and make time to do exercise and take care of herself

## 2011-01-16 NOTE — Letter (Signed)
Jan 03, 2007    Danny Lawless, MB, CHB  [facility]  [place], [place] [address]   RE:  [name], [name]  MRN:  [id number]  /  DOB:  Jan 31, 1941   Dear Jonny Ruiz:   I am asking you to evaluate [name] regarding multiple pancreatic  cystic lesions and changes look most like a prior inflammatory process  with sequelae. She also has a massive hiatal hernia that I am working  up. I think this is going to need surgical repair.   I would appreciate your opinion and any further evaluation of the  pancreatic abnormalities, in particular would any further investigation  be necessary prior to proceeding with her hiatal hernia surgery. I think  endoscopic ultrasound could be difficult given that her stomach appears  to be in her chest on MRI. An upper GI series and EGD are pending.   I am really trying to sort out how much and what surgery she needs. I  doubt that she will need any pancreatic intervention, but she could  potentially need a splenectomy depending upon what  is seen in the stomach (question gastric varices). I suppose  intraoperative evaluation of the pancreas could be an option as well.   I would appreciate discussing this with you after your evaluation.    Sincerely,      Iva Boop, MD,FACG  Electronically Signed    CEG/MedQ  DD: 01/03/2007  DT: 01/04/2007  Job #: [id number]

## 2011-01-16 NOTE — Discharge Summary (Signed)
NAME:  [name], [name]             ACCOUNT NO.:  [id number]   MEDICAL RECORD NO.:  [id number]          PATIENT TYPE:  INP   LOCATION:  1525                         FACILITY:  [facility]   PHYSICIAN:  Thornton Park. 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   POSTOPERATIVE DIAGNOSIS:  Large type 3 hiatal hernia with entire stomach  in chest.   PROCEDURE:  Laparoscopic repair of hiatal hernia with MTF, human skin  and Nissen, with intraoperative endoscopy.   COURSE IN THE HOSPITAL:  [name] is a 70 year old lady who  underwent the above-mentioned operation.  Postoperatively, she had  minimal pain and was doing well in step-down, using her CPAP.  Radiologic study was done on September 11 which showed site of the  postoperative Nissen site with some edema but otherwise looked good.  Started her back on her Cymbalta.  She remained without complaints, was  ready for discharge on May 17, 2007, tolerating clear liquids at  that time and discharged later in the day.   FINAL DIAGNOSIS:  Status post repair of type 3 mixed hiatal hernia.   CONDITION:  Good.      Thornton Park Daphine Deutscher, MD  Electronically Signed     MBM/MEDQ  D:  06/03/2007  T:  06/03/2007  Job:  [id number]

## 2011-01-16 NOTE — Assessment & Plan Note (Signed)
[facility]                                 ON-CALL NOTE   NAME:WHITESELLAviona, Martenson                      MRN:          [id number]  DATE:01/26/2008                            DOB:          1941-04-12    TIME OF CALL:  3:31 p.m.   TELEPHONE NUMBER:  [id number].   OBJECTIVE:  The patient thinks she has bronchitis.  She has been  congested since last Thursday.  I saw her husband Saturday morning in  the acute care clinic for upper respiratory infection and gave him an  antibiotic to take and told him  to take Mucus Relief expectorant from  [facility] where they shop.  The pharmacist instead of giving them Mucus Relief  expectorant, gave them Mucinex which they have been taking, and it has  helped break things up but now she is not getting anymore out and her  head is starting to hurt.  She would like an antibiotic called in as  well.  She has no fever at this time but does have headache.  I told her  to change from Mucinex to the Mucus Relief expectorant, push lots of  fluids, and if she is not better to call tomorrow for an appointment.   PRIMARY CARE PHYSICIAN:  Stacie Glaze, M.D., home office is  [place].     Arta Silence, MD  Electronically Signed    RNS/MedQ  DD: 01/26/2008  DT: 01/26/2008  Job #: [id number]

## 2011-01-16 NOTE — Op Note (Signed)
NAME:  [name], [name]             ACCOUNT NO.:  [id number]   MEDICAL RECORD NO.:  [id number]          PATIENT TYPE:  INP   LOCATION:  1228                         FACILITY:  [facility]   PHYSICIAN:  Thornton Park. Daphine Deutscher, MD  DATE OF BIRTH:  June 15, 1941   DATE OF PROCEDURE:  05/14/2007  DATE OF DISCHARGE:                               OPERATIVE REPORT   PREOPERATIVE DIAGNOSIS:  Entire stomach in her chest.   PROCEDURE:  Laparoscopic repair of giant type 3 mixed hiatal hernia  (four pledgeted sutures posteriorly, onlay of human MTF tissue graft  sutured to the diaphragm), upper endoscopy, and Nissen fundoplication  over a #50 lighted bougie.   ASSISTANT:  Ovidio Kin   ANESTHESIA:  General endotracheal.   DESCRIPTION OF PROCEDURE:  [name] was taken to room 1 at  [facility] on May 14, 2007 and given general anesthesia.  The  abdomen was entered through the left upper quadrant using an OptiVu  technique and the 0 degree scope and then a total of six trocars were  used, including 5 mm in the upper midline to retract the liver.  Grossly  surveying the abdomen, I did not see any evidence of any metastatic  disease and the pancreas was not really visible from this view.  By the  size of the hernia that was seen on the upper GI, my work was cut out  for me as this was a very formidable large hernia.  I had talked to her  preoperatively and obtained informed consent about the risk of doing  this laparoscopically as well as open.   First, I used a Orthoptist and we reduced the stomach from the chest and  found it to be well stuck up in the chest.  I incised the sac with a  Harmonic scalpel and I was able to resect almost all of the sac and  bring this into the abdomen.  Some of this was removed and sent for  permanent section.  Eventually, after dissecting for at least an hour, I  had good exposure and I felt like I had gotten the esophagus to length.  I put a Penrose drain around  behind the esophagus to better delineate  the right left crus.  She had a very large hiatal defect.   Dr. Ezzard Standing endoscoped the patient and we confirmed the EG junction and  this was well in the abdomen.   I then closed the posterior crura with pledgeted sutures using the Endo  stitch, placing four such sutures and getting a good approximation of  the crura.   I then cut a piece of MTF extra thick and cut it with the pants going  anteriorly with the solid portion posterior to the esophagus.  This was  tacked to the crura into the diaphragmatic closure with four sutures  using a tie knot device.  This secured this nicely and very well  reinforced the hiatal hernia repair.   Next, I went ahead and prepared for the wrap and pulled the stomach  around the distal esophagus.  With a 50 bougie in place, I  invaginated  it and the wrap of fundus and sutured it with three sutures, first using  a free needle in a tie knot and then two subsequent Endo stitch sutures  all tied down to get a nice snug closure.  The lighted bougie was  removed.  There was no bleeding noted.  Everything looked to be in order  and looked good.  The abdomen was deflated and the wounds were closed  with 4-0 Vicryl, Benzoin and Steri-Strips.   The patient tolerated the procedure well and was taken to the recovery  room in satisfactory condition.      Thornton Park Daphine Deutscher, MD  Electronically Signed     MBM/MEDQ  D:  05/14/2007  T:  05/15/2007  Job:  [id number]   cc:   Stacie Glaze, MD  [address] [place]  [place] [address]   Iva Boop, MD,FACG  [facility]  [address] [place], [place] [address]

## 2011-01-16 NOTE — Assessment & Plan Note (Signed)
[name]                         GASTROENTEROLOGY OFFICE NOTE   NAME:[name], [name]                  MRN:          [id number]  DATE:01/02/2007                            DOB:          10/09/1940    REFERRING PHYSICIAN:  Stacie Glaze, MD   REASON FOR CONSULTATION:  Abnormal pancreas with cysts.   ASSESSMENT:  This is a complicated 70 year old white woman with multiple  problems.  1. 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  No previous history of abdominal      trauma either according to the patient.  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  Her CA-19-9 level is normal.  She may have a common      channel with the bile duct and the pancreatic duct at the ampulla,      which could predispose the pancreatitis.   1. Significant hiatal hernia with the entire stomach in the chest per      magnetic resonance imaging April 11, 2006.  I believe this is the      cause of her left upper quadrant discomfort and belching and gas      symptoms that she has.   RECOMMENDATIONS AND PLAN:  1. Workup the hiatal hernia with an upper GI series and an upper GI      endoscopy.  This will help to exclude gastric varices, which could      be possible considering her pancreatic changes.  2. I will get an opinion on these pancreatic lesions from Dr. Danny [name] at [facility].  I think they are probably      benign.  It would be difficult to get an endoscopic ultrasound      image with her stomach in the chest.  I doubt they need be      biopsies; perhaps they simply need observation.  However, if      pancreatic surgery is indicated it think this would be complicated      and best served at a tertiary center. This was her preference, too.  3. The patient  could need a splenectomy depending upon what is seen in      the stomach, i.e. gastric varices, etc; though, with the      splenorenal shunt maybe she has decompressed.  4. I think the hiatal hernia needs to be repaired.  We will need to      sort that out as well depending upon what other surgeries would be      needed.   The risks, benefits and indications of the procedures ordered have been  explained to the patient.  She understands and agrees to proceed.  Further plans depending that and the consultation with Dr. Marilynne [name].   HISTORY:  This is a 70 year old white woman who has seen Dr.  Lovell [name]  recently.  She had been complaining of some problems of abdominal pain  and chest pain.  She saw Dr. Lovell [name] who ordered an ultrasound and found  some cystic changes in the pancreas.  Subsequently the MRI was ordered.  The ultrasound showed possible cysts anteriorly to the neck of the  pancreas.  Normal common bile duct.  Gallbladder without stones.  Subsequently the MRI of the abdomen demonstrated the large hiatal hernia  as described above.  The liver was okay.  Splenic changes as described  above were present.  The spleen itself looked normal.  There was a  multiseptate cystic lesion in the pancreatic body with a 12 mm cyst  adjacent to the multiseptate-appearing cyst.  There was a 10 mm cyst in  the pancreatic tail and multiple cystic areas in the pancreatic  narrowing the splenic hilum. The pancreatic duct was not dilated.  The  major pancreatic duct appeared to join the common bile duct in the head  of the pancreas.  No definite divisum noted.  Note, this is not an MRCP.   The patient has had problems with eating, postprandial pain and  discomfort, and what she describes as gas pains helped by Mylanta.  She  has had some rather rapid relief of these symptoms as well at times.  She has occasional constipation and states she has hemorrhoid flares  occasionally.  A CA-99 was 7.5.  her  other laboratory tests from November 18, 2006 showed a normal CMET.  Hemoglobin A-1-C 6.5.  Cholesterol 145  and triglycerides 95.   MEDICATIONS:  1. Zyrtec 1 mg daily.  2. Avalide 300/12.5 mg daily.  3. Baclofen 10 mg 2-3 each day.  4. Etodolac 300 mg twice a day.  5. Verapamil 240 mg twice a day.  6. Cymbalta 60 mg daily.  7. Ambien 10 mg at bedtime  8. Meloxicam 15 mg daily.  9. Prevacid 30 mg twice a day.  10.Lipitor 5 mg daily.  11.Xanax XR 1 mg daily.  12.Multivitamin daily.  13.B complex daily.  14.Vitamins C, D and E daily.  15.Calcium daily.  16.Hydrocodone/APAP.   ALLERGIES:  Drug allergies:  None known.   PAST MEDICAL HISTORY:  1. Obstructive sleep apnea.  2. Allergies rhinosinusitis.  3. L4 and L5 fusion April 2005.  4. Anxiety.  5. Osteoarthritis.  6. Dyslipidemia.  7. Obesity.  8. Diabetes mellitus.  9. Hypertension.  10.Hiatal hernia.  11.Pancreatic cysts and changes as descried above.   FAMILY HISTORY:  Mother had breast cancer.  Heart disease in her  parents.  No colon cancer or pancreatic malignancy reported.   SOCIAL HISTORY:  The patient is married.  She lives with her husband.  She is retired from [employer] and now works as a Therapist, sports with a Clinical cytogeneticist firm.  No alcohol, tobacco or  drugs.   REVIEW OF SYSTEMS:  Eyeglasses.  Allergies.  Joint pain.  Back pain.  Sleep apnea issues.  All other systems are negative.   PHYSICAL EXAMINATION:  VITAL SIGNS:  Height 5 feet 3 inches.  Weight 226  pounds.  Blood pressure 124/84.  Pulse 96.  GENERAL APPEARANCE:  The patient is obese and in no acute distress.  HEENT:  Eyes are anicteric.  ENT:  No masses.  NECK:  The neck is thick, supple with no thyromegaly or masses.  HEART:  The heart has a 2/6 systolic murmur at he right upper sternal  border without radiation.  ABDOMEN:  The abdomen is obese, soft and nontender without organomegaly  or mass. LYMPHATICS:  No neck or  supraclavicular nodes.  EXTREMITIES: The extremities have no edema.  SKIN:  The skin has no rash.  PSYCHIATRIC:  The patient is alert and oriented times three.   NOTE:  This patient also could potentially need a colonoscopy prior to  any abdominal surgery just as a screening procedure.  I have personally  view her x-rays.   I appreciate the opportunity to care for this patient.     Iva Boop, MD,FACG  Electronically Signed    CEG/MedQ  DD: 01/03/2007  DT: 01/04/2007  Job #: [id number]   cc:   Felicia Glaze, MD  [name]

## 2011-01-19 NOTE — H&P (Signed)
NAME:  [name], [name]                       ACCOUNT NO.:  [id number]   MEDICAL RECORD NO.:  [id number]                   PATIENT TYPE:  INP   LOCATION:  NA                                   FACILITY:  [facility]   PHYSICIAN:  Sharolyn Douglas, M.D.                     DATE OF BIRTH:  28-Sep-1940   DATE OF ADMISSION:  12/15/2003  DATE OF DISCHARGE:                                HISTORY & PHYSICAL   CHIEF COMPLAINT:  Pain in my back and right leg.   HISTORY OF PRESENT ILLNESS:  Brown 70 year old white female, seen by [place] for  continued, progressive problems concerning low back pain with the radiation  into the right lower extremity.  She was referred to [place] in mid March through  the courtesy of Dr. Darryll Capers for her symptomatology.  The patient  recalls the situation which occurred in late November, 2004 after Brown trip to  [place].  This was Brown motor trip and she had difficulty getting out of her  vehicle and moving about after that trip.  Conservative care was initiated  with muscle relaxers, analgesics, acupuncture, and massage therapy.  She has  also had formal physical therapy.  The patient had extreme difficulty  walking.  It is somewhat improved today.  However, it has been to the point  where she cannot really move about at all.  Her overall lifestyle has  markedly been interfered with due to this pain.  She has extreme difficulty  in enjoying just everyday activities such as going to church.  Due to the  symptomatology consistent with chronic back pain and neurogenic  claudication.  It was felt the patient was in need of further diagnostic  care.  We ordered an MRI and spinal stenosis was seen at L4-5 secondary to  subluxation.  She also had hypertrophy at this level and Brown large synovial  cyst was noted.  After much consideration by her and her husband, it was  decided to go ahead with surgical intervention.  DSI's were mentioned but  the patient decided that since there was Brown synovial cyst  present, that she  would benefit with surgery and we agreed.   PAST MEDICAL HISTORY:  This patient is under the care of Dr. Darryll Capers  and he has her on:  1. Verapamil ER 240 mg 2 Brown day, probably 1 b.i.d.  2. Avalide 300/12.5 mg 1 q.d.  3. Zyrtec 10 mg 1 q.d.  4. Mobic 15 mg 1 q.d.  5. Alprazolam 0.25 mg 1/2 b.i.d.  6. Zoloft 50 mg 1 q.d.  7. Prevacid 30 mg 1 b.i.d.   She also takes 81 mg of aspirin per day (will stop today).  Also vitamin E  and Brown multivitamin, which she will stop today, as well.  She is allergic to  Thiazide.   The patient has had no previous surgeries.  She is  being treated with the  medications above for hypertension, hiatal hernia with presumably GERD,  anxiety and depression.   Family history is positive for heart disease, hypertension, and diabetes.   SOCIAL HISTORY:  The patient is married.  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  There is no intake of alcohol  or tobacco products.  Her husband, [name], will be her caregiver after  surgery, and she has 5 steps into the house.   REVIEW OF SYSTEMS:  CNS:  No seizure disorder, paralysis, numbness or double  vision, but radiculitis as mentioned above with right lower extremity.  CARDIOVASCULAR:  No chest pain.  No angina or orthopnea.  RESPIRATORY:  No  productive cough.  No hemoptysis.  No shortness of breath.  GASTROINTESTINAL:  No nausea or vomiting, melena, or bloody stools.  She  does well with her medications.  She also has chronic constipation and must  take Metamucil and an apple per day.  GENITOURINARY:  No discharge or  dysuria or hematuria.  MUSCULOSKELETAL:  Primarily in present illness.   PHYSICAL EXAMINATION:  GENERAL:  Alert and cooperative, friendly 70 year old  white female, well-groomed.  She is accompanied by her husband.  VITAL SIGNS:  Blood pressure 138/86, pulse 86, respirations are 12.  HEENT:  Normocephalic.  PERRLA.  EOM intact.   Oropharynx is clear.  CHEST:  Clear to auscultation.  No rhonchi.  No rales.  HEART:  Regular rate and rhythm.  There is Brown grade 3/6 systolic murmur heard  best at the right systolic border in the aortic area.  Nonradiating to the  neck.  ABDOMEN:  Soft, nontender.  Liver and spleen not felt.  Genitalia, rectal, pelvis, breast not done, not pertinent to present  illness.  EXTREMITIES:  Patient has negative straight leg raising bilaterally.  No  gross sensory deficit.  Motor is intact.   ADMITTING DIAGNOSIS:  1. 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  2. Hypertension.  3. Anxiety/depression.   PLAN:  Patient will undergo L4-5 laminectomy with posterior spinal fusion  with pedicle screws and lumbar interbody fusion.      Dooley L. Cherlynn June.                 Sharolyn Douglas, M.D.    DLU/MEDQ  D:  12/09/2003  T:  12/09/2003  Job:  [id number]   cc:   Stacie Glaze, M.D. [facility]

## 2011-01-19 NOTE — Op Note (Signed)
NAME:  [name], [name]                       ACCOUNT NO.:  [id number]   MEDICAL RECORD NO.:  [id number]                   PATIENT TYPE:  INP   LOCATION:  2899                                 FACILITY:  [facility]   PHYSICIAN:  Sharolyn Douglas, M.D.                     DATE OF BIRTH:  Nov 15, 1940   DATE OF PROCEDURE:  12/15/2003  DATE OF DISCHARGE:                                 OPERATIVE REPORT   PREOPERATIVE DIAGNOSIS:  L4-5 degenerative spondylolisthesis and synovial  cyst with spinal stenosis.   PROCEDURE:  1. L4-5 lumbar laminectomy with removal of synovial cyst and decompression     of the common thecal sac and nerve roots bilaterally.  2. Transforaminal lumbar interbody fusion L4-5, placement of a 12 mm     Nuvasive peak prosthetic cage.  3. Posterior spinal arthrodesis L4-5.  4. Pedicle screw instrumentation L4-5 using spinal concept system.  5. Local autogenous bone graft supplemented with 5 mL Allomatrix bone graft     substitute.  6. Neuro monitoring with triggered and free running EMG's.   SURGEON:  Sharolyn Douglas, M.D.   ASSISTANT:  Verlin Fester, P.A.   ANESTHESIA:  General endotracheal.   COMPLICATIONS:  None.   INDICATIONS FOR PROCEDURE:  The patient is a 70 year old female with a  progressive history of worsening back and bilateral lower extremity pain  right greater than left.  Her plain radiographs show degenerative changes  and spondylolisthesis at L4-5. MRI scan demonstrates severe spinal stenosis  at L4-5 secondary to posterior element hypertrophy and a large synovial cyst  off the right facet joint.  Because of her persistent symptoms unresponsive  to all conservative care, she has elected to undergo L4-5 decompression and  fusion in hopes of improving her pain.   DESCRIPTION OF PROCEDURE:  The patient was properly identified in the  holding area and taken to the operating room.  She underwent general  endotracheal anesthesia without difficulty.  She was given  prophylactic IV  antibiotics.  She was carefully positioned prone on the Wilson frame.  All  bony prominences were well padded.  Face and eyes protected at all times.  The back prepped and draped in the usual sterile fashion.  8 cm incision  made over the L4-5 interspace.  Dissection was carried sharply through the  deep fascia.  The L4 and L5 transverse processes were exposed.  Deep  retractors were placed.  The L4-5 joints were found to be severely  degenerative with spurring and disruption of the capsule.  A laminectomy was  initiated at the L4-5 interspace.  The entire L4 spinous process was  removed.  The laminectomy was carried cephalad to the L4-5 joints.  We  encountered severe spinal stenosis secondary to facet hypertrophy,  ligamentum flavum hypertrophy, as well as a large synovial cyst which was  directed into the spinal canal deflecting the thecal sac from right to  left.  Great care was taken to dissect the cyst from the underlying dura using  loupes and had light magnification.  When the laminectomy was complete, the  spinal canal was completely decompressed.  We completed a lateral recess  decompression bilaterally.  We identified the L4 and L5 nerve roots and they  were found to be free out their respective foramen.  We then turned our  attention to performing a transforaminal lumbar interbody fusion on the  right side at L4-5.  The inferior facet of L4 was osteotomized.  The  superior facet of L5 was cut flush with the pedicle. This created the  transforaminal window.  We then identified the exiting L4 nerve root and  transversing L5 root and protected these structures at all times.  Sharp  annulotomy performed.  TLIF instruments used to scrape the cartilaginous  endplates and complete a radical diskectomy across the contralateral side.  WE dilated the disk space up to 12 mm.  We then packed the disk space  tightly with local autogenous bone graft collected from the  laminectomy  which had been cleaned of all soft tissue.  We then placed a 12 mm peek  prosthetic cage into the interspace, carefully tamped it across the midline  and anteriorly.  We used fluoroscopy to confirm good positioning.  We then  turned our attention to placing pedicle screws at L4 and L5 bilaterally.  Using anatomic probing technique, we identified the starting point for each  pedicle. The pedicle was then probed.  We were able to palpate the pedicle  both from within the spinal canal as well as the pedicle hole using a blunt  probe and there were no breaches.  We placed 45 x 6.5 mm screws x4.  We had  good screw purchase.  We then turned our attention to performing a posterior  spinal arthrodesis at L4 and L5.  The transverse processes of L4 and L5 were  decorticated using high speed bur.  The pars intra-articularis was also  decorticated.  We then packed the remaining local autogenous bone graft into  the lateral gutters. This was supplemented with 5 mL of Allomatrix bone  graft substitute.  We then placed our Titanium rods 40 mm in length  bilaterally.  Compression was applied across the L4-5 segment before  shearing off the locking caps.  Final AP and lateral x-rays show appropriate  positioning of the pedicle screws and interbody graft.  We then irrigated  the wound and placed Gelfoam over the exposed epidural space.  Deep  Hemovac  drain left in place.  Deep fascia closed with running #1 Vicryl suture,  subcutaneous layer closed with 0 Vicryl followed by 2-0 Vicryl and then a  running subcuticular Vicryl suture on the skin.  Benzoin and Steri-Strips  placed.  Sterile dressing applied.  The patient turned spine, extubated  without difficulty, and transferred to the recovery room in stable condition  able to move her upper and lower extremities.  It should be noted that we  monitored free running EMG's throughout the procedure.  There were no deleterious changes.  In addition  after placing each pedicle screw, we  utilized triggered EMG's and had a response of greater than 20 milliamps  consistent with interosseous placement of the screw.  Sharolyn Douglas, M.D.    MC/MEDQ  D:  12/15/2003  T:  12/16/2003  Job:  [id number]

## 2011-01-19 NOTE — Discharge Summary (Signed)
NAME:  [name], [name]                       ACCOUNT NO.:  [id number]   MEDICAL RECORD NO.:  [id number]                   PATIENT TYPE:  INP   LOCATION:  5029                                 FACILITY:  [facility]   PHYSICIAN:  Sharolyn Douglas, M.D.                     DATE OF BIRTH:  [birth year]   DATE OF ADMISSION:  12/15/2003  DATE OF DISCHARGE:  12/20/2003                                 DISCHARGE SUMMARY   REFERRING PHYSICIAN:  Stacie Glaze, M.D.   ADMISSION DIAGNOSES:  1. 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  2. Hypertension.  3. Anxiety and depression.   DISCHARGE DIAGNOSES:  1. Status post L4-5 laminectomy and posterior spinal fusion.  2. Mild postoperative hemorrhagic anemia that was stable and did not require     transfusion.  3. Hypertension.  4. Anxiety and depression.   CONSULTATIONS:  None.   PROCEDURE:  On December 15, 2003, the patient was taken to the operating room  for L4-5 laminectomy as well as posterior spinal fusion with pedicle screws  and TLIF.  This was done by Sharolyn Douglas, M.D., assisted by Verlin Fester,  P.A.C.  Anesthesia used was general.   CONSULTATIONS:  None.   LABORATORY DATA:  Preoperatively CBC with differential was within normal  limits.  H&H responded postoperatively and reached a low of 9.5 and 27.2 on  December 17, 2003.  She was asymptomatic and did not require transfusion.  PT,  INR and PTT normal preoperatively.  Complete metabolic panel normal  perioperatively.  Basic metabolic panel on December 16, 2003, showed a slightly  elevated glucose of 122, otherwise normal.  UA from preoperatively was  negative with the exception of bacteria too numerous to count.  Blood typing  from December 14, 2003, type A, Rh type negative, antibody screen negative.   EKG from December 14, 2003, normal sinus rhythm read by Othelia Pulling, M.D.   X-ray was used intraoperatively for localization.   HISTORY OF PRESENT ILLNESS:  The patient is a 70 year old female seen  by Dr.  Noel Gerold for continued progressive problems concerning low back and radiation  into the right lower extremity.  She was referred to [place] in mid March to Dr.  Darryll Capers.  The patient recalled a situation which occurred back in  November of 2004, after a trip to [place] when she had significant  difficulty getting out of her vehicle and moving about after the trip.  Conservative care failed including muscle relaxers and analgesics,  acupuncture and massage therapy, physical therapy.  The pain had gotten to  the point that it was interfering with her activities of daily living and  quality of life  had suffered significantly.  Therefore, given her x-ray  findings and MRI findings as well as her symptoms that were not responding  to conservative care, it was felt that her best course of management would  be the  posterior spinal fusion and laminectomy procedure.  Risks and  benefits of this procedure were discussed with the patient by Dr. Noel Gerold as  well as myself.  She indicated understanding and opted to proceed.   HOSPITAL COURSE:  On December 15, 2003, the patient was taken to the operating  room for the above listed procedure.  She tolerated the procedure well  without any intraoperative complications.  There were approximately 500 mL  of blood loss with 200 returned via Cell Saver.  One Hemovac drain was  placed.  She was transferred to the recovery room in stable condition.   Postoperatively, routine orthopedic spine protocol was followed.  She  progressed along well with this and pain control remained adequate.  Her  diet was advanced after flatus and she did well in this regard.  She did  have a low grade temperature of 101 but after increased activity as well as  incentive spirometer use, this did resolve on its own prior to discharge.  Physical therapy and occupational therapy were consulted to work with her  and she progressed along well with them.   By December 20, 2003, the  patient was medically stable and she was  orthopedically stable and ready for discharge to her home.   DISCHARGE PLAN:  The patient is a 70 year old female status post posterior  spinal fusion doing well.   FOLLOW UP:  Two weeks postoperatively with Dr. Noel Gerold.  Instructed to call  for an appointment.   ACTIVITY:  Daily ambulation, dressing changes daily.  She may shower on  postoperative day #5.  No lifting heavier than five pounds.  Back  precautions at all times, brace should be on when she is up.   MEDICATIONS:  1. Percocet p.r.n. for pain.  2. Robaxin p.r.n. muscle spasm.  3. Multivitamins daily.  4. Calcium daily.  5. Over-the-counter laxative as needed.  6. Avoid NSAIDs the next three months.   DIET:  Regular home diet.   CONDITION ON DISCHARGE:  Stable and improved.   DISPOSITION:  Discharged to home with family's assistance as well as Genevieve Norlander  for home health physical therapy and occupational therapy.      Verlin Fester, P.A.                       Sharolyn Douglas, M.D.    CM/MEDQ  D:  01/26/2004  T:  01/27/2004  Job:  [id number]   cc:   Stacie Glaze, M.D. [facility]

## 2011-01-19 NOTE — Procedures (Signed)
NAME:  [name], [name]             ACCOUNT NO.:  [id number]   MEDICAL RECORD NO.:  [id number]          PATIENT TYPE:  OUT   LOCATION:  SLEEP CENTER                 FACILITY:  [facility]   PHYSICIAN:  Marcelyn Bruins, M.D. [facility] DATE OF BIRTH:  Jun 02, 1941   DATE OF STUDY:  11/08/2005                              NOCTURNAL POLYSOMNOGRAM   REFERRING PHYSICIAN:  Dr. Darryll Capers.   DATE OF STUDY:  November 08, 2005.   INDICATION FOR STUDY:  Hypersomnia with sleep apnea.   EPWORTH SCORE:  4.   SLEEP ARCHITECTURE:  The patient had a total sleep time of 340 minutes with  minimal REM and never achieved slow wave sleep. Sleep onset latency was  prolonged at 47 minutes as was REM onset at 312 minutes. Sleep efficiency  was decreased at 80%.   RESPIRATORY DATA:  The patient was found to have 211 hypopneas and 45 apneas  for a respiratory disturbance index of 45 events per hour. The events  occurred in all body positions, and there was moderate snoring noted.   OXYGEN DATA:  The patient had O2 desaturation as low as 79%.   CARDIAC DATA:  No clinically significant cardiac arrhythmias were noted.   MOVEMENT/PARASOMNIA:  There were 22 leg jerks with very little sleep  disruption.   IMPRESSION/RECOMMENDATIONS:  1.  Severe obstructive sleep apnea/hypopnea syndrome with a respiratory      disturbance index of 45 events per hour and O2 desaturation as low as      79%. Treatment for this degree of sleep apnea includes weight loss if      applicable as well as C-PAP. There are other alternatives as well, but      their success rate is quite variable.  2.  Small numbers of leg jerks with no significant sleep disruption.           ______________________________  Marcelyn Bruins, M.D. [facility]  Diplomate, American Board of Sleep  Medicine     KC/MEDQ  D:  11/20/2005 12:40:30  T:  11/21/2005 01:20:45  Job:  [id number]

## 2011-02-14 ENCOUNTER — Other Ambulatory Visit: Payer: Self-pay | Admitting: Internal Medicine

## 2011-03-27 ENCOUNTER — Ambulatory Visit: Payer: Medicare Other | Admitting: Internal Medicine

## 2011-04-06 ENCOUNTER — Ambulatory Visit: Payer: Medicare Other | Admitting: Internal Medicine

## 2011-04-11 ENCOUNTER — Ambulatory Visit: Payer: Medicare Other | Admitting: Internal Medicine

## 2011-04-13 ENCOUNTER — Other Ambulatory Visit: Payer: Self-pay | Admitting: *Deleted

## 2011-04-13 MED ORDER — TRAMADOL HCL 50 MG PO TABS: 50.0000 mg | ORAL_TABLET | Freq: Three times a day (TID) | ORAL | Status: DC | PRN

## 2011-04-18 ENCOUNTER — Encounter: Payer: Self-pay | Admitting: Internal Medicine

## 2011-04-18 ENCOUNTER — Ambulatory Visit (INDEPENDENT_AMBULATORY_CARE_PROVIDER_SITE_OTHER): Payer: Medicare Other | Admitting: Internal Medicine

## 2011-04-18 VITALS — BP 140/88 | HR 80 | Temp 98.2°F | Resp 16 | Ht 63.0 in | Wt 200.0 lb

## 2011-04-18 DIAGNOSIS — I10 Essential (primary) hypertension: Secondary | ICD-10-CM

## 2011-04-18 DIAGNOSIS — M5137 Other intervertebral disc degeneration, lumbosacral region: Secondary | ICD-10-CM

## 2011-04-18 DIAGNOSIS — M25559 Pain in unspecified hip: Secondary | ICD-10-CM

## 2011-04-18 DIAGNOSIS — E785 Hyperlipidemia, unspecified: Secondary | ICD-10-CM

## 2011-04-18 LAB — HEPATIC FUNCTION PANEL
AST: 18 U/L (ref 0–37)
Albumin: 4.2 g/dL (ref 3.5–5.2)
Alkaline Phosphatase: 40 U/L (ref 39–117)
Total Bilirubin: 0.4 mg/dL (ref 0.3–1.2)

## 2011-04-18 MED ORDER — TIZANIDINE HCL 4 MG PO TABS: 4.0000 mg | ORAL_TABLET | Freq: Three times a day (TID) | ORAL | Status: DC

## 2011-04-18 MED ORDER — LOSARTAN POTASSIUM-HCTZ 100-12.5 MG PO TABS: 1.0000 | ORAL_TABLET | Freq: Every day | ORAL | Status: DC

## 2011-04-18 MED ORDER — FENOFIBRATE 145 MG PO TABS: 145.0000 mg | ORAL_TABLET | Freq: Every day | ORAL | Status: DC

## 2011-04-18 MED ORDER — TRAMADOL HCL 50 MG PO TABS: 50.0000 mg | ORAL_TABLET | Freq: Three times a day (TID) | ORAL | Status: DC | PRN

## 2011-04-18 MED ORDER — ZOLPIDEM TARTRATE ER 12.5 MG PO TBCR: 12.5000 mg | EXTENDED_RELEASE_TABLET | Freq: Every evening | ORAL | Status: DC | PRN

## 2011-04-18 MED ORDER — METAXALONE 800 MG PO TABS: 800.0000 mg | ORAL_TABLET | Freq: Three times a day (TID) | ORAL | Status: DC | PRN

## 2011-04-18 MED ORDER — FENOFIBRATE 150 MG PO CAPS: 1.0000 | ORAL_CAPSULE | Freq: Every day | ORAL | Status: DC

## 2011-04-18 NOTE — Progress Notes (Signed)
Subjective:    Patient ID: [name], female    DOB: 04-09-1941, 70 y.o.   MRN: [id number]  HPI Patient is a pleasant 70 year old female presents for followup of hyperlipidemia after a recent change from trilipix to generic fenofibrate.  She is also followed for hypertension which is moderately elevated scale that she is in pain from her back a third issue we're following her for pain control for her back and for timing of the need for further evaluation by her back surgeon as to whether or not surgery was the best option at this time since she has failed injection therapy and is stretching the limits of the ultrasound to the maximum.     Review of Systems  Constitutional: Negative for activity change, appetite change and fatigue.  HENT: Negative for ear pain, congestion, neck pain, postnasal drip and sinus pressure.   Eyes: Negative for redness and visual disturbance.  Respiratory: Negative for cough, shortness of breath and wheezing.   Gastrointestinal: Negative for abdominal pain and abdominal distention.  Genitourinary: Negative for dysuria, frequency and menstrual problem.  Musculoskeletal: Positive for back pain and gait problem. Negative for myalgias, joint swelling and arthralgias.  Skin: Negative for rash and wound.  Neurological: Positive for weakness. Negative for dizziness and headaches.  Hematological: Negative for adenopathy. Does not bruise/bleed easily.  Psychiatric/Behavioral: Negative for sleep disturbance and decreased concentration.   Past Medical History  Diagnosis Date  . Allergy   . Depression   . GERD (gastroesophageal reflux disease)   . Hyperlipidemia   . Hypertension   . Arthritis    Past Surgical History  Procedure Date  . Lumbar laminectomy   . Lumbar fusion   . Gastric surgeryobesity type and hh surgeon matt martin     reports that she has never smoked. She does not have any smokeless tobacco history on file. She reports that she does  not drink alcohol or use illicit drugs. family history includes Diabetes in her paternal grandfather and Heart disease in her father and mother. No Known Allergies     Objective:   Physical Exam  Nursing note and vitals reviewed. Constitutional: She is oriented to person, place, and time. She appears well-developed and well-nourished. No distress.  HENT:  Head: Normocephalic and atraumatic.  Right Ear: External ear normal.  Left Ear: External ear normal.  Nose: Nose normal.  Mouth/Throat: Oropharynx is clear and moist.  Eyes: Conjunctivae and EOM are normal. Pupils are equal, round, and reactive to light.  Neck: Normal range of motion. Neck supple. No JVD present. No tracheal deviation present. No thyromegaly present.  Cardiovascular: Normal rate, regular rhythm, normal heart sounds and intact distal pulses.   No murmur heard. Pulmonary/Chest: Effort normal and breath sounds normal. She has no wheezes. She exhibits no tenderness.  Abdominal: Soft. Bowel sounds are normal.  Musculoskeletal: Normal range of motion. She exhibits no edema and no tenderness.  Lymphadenopathy:    She has no cervical adenopathy.  Neurological: She is alert and oriented to person, place, and time. She has normal reflexes. No cranial nerve deficit.  Skin: Skin is warm and dry. She is not diaphoretic.  Psychiatric: She has a normal mood and affect. Her behavior is normal.          Assessment & Plan:  For the back pain we will change the muscle relaxants and parent with the Ultram 100 mg of Ultram the muscle relaxants and 2 Tylenol 3 times a day and see if  this is more effective in controlling her back pain we encouraged her to go back to see Dr. Noel Gerold for further evaluation and perhaps timing of surgical intervention.  Her blood pressure is stable on her current medications and a refill of several of her medications including her cholesterol drug and her sleeping medications was done

## 2011-05-02 ENCOUNTER — Other Ambulatory Visit: Payer: Self-pay | Admitting: Rehabilitation

## 2011-05-02 DIAGNOSIS — M545 Low back pain: Secondary | ICD-10-CM

## 2011-05-10 ENCOUNTER — Ambulatory Visit
Admission: RE | Admit: 2011-05-10 | Discharge: 2011-05-10 | Disposition: A | Discharge status: Home / Self Care | Payer: Medicare Other | Source: Ambulatory Visit | Attending: Rehabilitation | Admitting: Rehabilitation

## 2011-05-10 DIAGNOSIS — M545 Low back pain, unspecified: Secondary | ICD-10-CM

## 2011-05-10 MED ORDER — GADOBENATE DIMEGLUMINE 529 MG/ML IV SOLN
19.0000 mL | Freq: Once | INTRAVENOUS | Status: AC | PRN
  Administered 2011-05-10: 19 mL via INTRAVENOUS

## 2011-05-14 ENCOUNTER — Telehealth: Payer: Self-pay | Admitting: Internal Medicine

## 2011-05-14 NOTE — Telephone Encounter (Signed)
Pt wants to know when or if she has had pneumonia vax? Pt is going to be admitted to hosp in approx 3 wks and wants to know if Dr Lovell Sheehan would advise her to get the Pneumonia and Flu vax before going to hosp.

## 2011-05-14 NOTE — Telephone Encounter (Signed)
Pt aware.

## 2011-05-14 NOTE — Telephone Encounter (Signed)
She had a pneumonia vaccine in 2006

## 2011-05-15 ENCOUNTER — Ambulatory Visit (INDEPENDENT_AMBULATORY_CARE_PROVIDER_SITE_OTHER): Payer: Medicare Other | Admitting: Internal Medicine

## 2011-05-15 DIAGNOSIS — Z23 Encounter for immunization: Secondary | ICD-10-CM

## 2011-05-24 ENCOUNTER — Other Ambulatory Visit: Payer: Self-pay | Admitting: Internal Medicine

## 2011-05-30 ENCOUNTER — Other Ambulatory Visit: Payer: Self-pay | Admitting: Internal Medicine

## 2011-05-31 ENCOUNTER — Other Ambulatory Visit: Payer: Self-pay | Admitting: *Deleted

## 2011-06-13 ENCOUNTER — Other Ambulatory Visit: Payer: Self-pay | Admitting: Internal Medicine

## 2011-06-15 LAB — BASIC METABOLIC PANEL
BUN: 14
CO2: 24
CO2: 31
Calcium: 9
Calcium: 9
Chloride: 102
Creatinine, Ser: 0.63
Creatinine, Ser: 0.86
GFR calc Af Amer: >60
GFR calc Af Amer: >60
GFR calc non Af Amer: >60
Glucose, Bld: 130 — ABNORMAL HIGH
Potassium: 3.6
Potassium: 3.9
Sodium: 138
Sodium: 139

## 2011-06-15 LAB — CBC
HCT: 33.9 — ABNORMAL LOW
Hemoglobin: 11.3 — ABNORMAL LOW
Hemoglobin: 11.9 — ABNORMAL LOW
MCHC: 34.4
MCHC: 35.1
MCV: 90.9
RBC: 3.55 — ABNORMAL LOW
RBC: 3.73 — ABNORMAL LOW
WBC: 11.6 — ABNORMAL HIGH

## 2011-06-15 LAB — HEMOGLOBIN AND HEMATOCRIT, BLOOD
HCT: 36.6
Hemoglobin: 12.5

## 2011-06-26 ENCOUNTER — Other Ambulatory Visit (HOSPITAL_COMMUNITY): Payer: Self-pay | Admitting: Orthopedic Surgery

## 2011-06-26 ENCOUNTER — Ambulatory Visit (HOSPITAL_COMMUNITY)
Admission: RE | Admit: 2011-06-26 | Discharge: 2011-06-26 | Disposition: A | Discharge status: Home / Self Care | Payer: Medicare Other | Source: Ambulatory Visit | Attending: Orthopedic Surgery | Admitting: Orthopedic Surgery

## 2011-06-26 ENCOUNTER — Encounter (HOSPITAL_COMMUNITY)
Admission: RE | Admit: 2011-06-26 | Discharge: 2011-06-26 | Disposition: A | Discharge status: Home / Self Care | Payer: Medicare Other | Source: Ambulatory Visit | Attending: Orthopedic Surgery | Admitting: Orthopedic Surgery

## 2011-06-26 DIAGNOSIS — Z01812 Encounter for preprocedural laboratory examination: Secondary | ICD-10-CM | POA: Insufficient documentation

## 2011-06-26 DIAGNOSIS — Z01810 Encounter for preprocedural cardiovascular examination: Secondary | ICD-10-CM | POA: Insufficient documentation

## 2011-06-26 DIAGNOSIS — M48061 Spinal stenosis, lumbar region without neurogenic claudication: Secondary | ICD-10-CM | POA: Insufficient documentation

## 2011-06-26 DIAGNOSIS — Z01818 Encounter for other preprocedural examination: Secondary | ICD-10-CM | POA: Insufficient documentation

## 2011-06-26 LAB — DIFFERENTIAL
Eosinophils Absolute: 0.1 10*3/uL (ref 0.0–0.7)
Eosinophils Relative: 2 % (ref 0–5)
Lymphocytes Relative: 33 % (ref 12–46)
Lymphs Abs: 2.7 10*3/uL (ref 0.7–4.0)
Monocytes Absolute: 0.7 10*3/uL (ref 0.1–1.0)
Monocytes Relative: 9 % (ref 3–12)
Neutrophils Relative %: 55 % (ref 43–77)

## 2011-06-26 LAB — URINE MICROSCOPIC-ADD ON

## 2011-06-26 LAB — TYPE AND SCREEN
ABO/RH(D): A NEG
Antibody Screen: NEGATIVE

## 2011-06-26 LAB — URINALYSIS, ROUTINE W REFLEX MICROSCOPIC
Bilirubin Urine: NEGATIVE
Glucose, UA: NEGATIVE mg/dL
Ketones, ur: 15 mg/dL — AB
Nitrite: POSITIVE — AB
Urobilinogen, UA: 0.2 mg/dL (ref 0.0–1.0)
pH: 5.5 (ref 5.0–8.0)

## 2011-06-26 LAB — COMPREHENSIVE METABOLIC PANEL
AST: 15 U/L (ref 0–37)
BUN: 19 mg/dL (ref 6–23)
CO2: 27 mEq/L (ref 19–32)
Calcium: 10.7 mg/dL — ABNORMAL HIGH (ref 8.4–10.5)
Chloride: 98 mEq/L (ref 96–112)
Creatinine, Ser: 0.77 mg/dL (ref 0.50–1.10)
GFR calc Af Amer: >90 mL/min (ref >90)
GFR calc non Af Amer: 83 mL/min — ABNORMAL LOW (ref >90)
Glucose, Bld: 87 mg/dL (ref 70–99)
Total Protein: 7.6 g/dL (ref 6.0–8.3)

## 2011-06-26 LAB — CBC
MCH: 31.4 pg (ref 26.0–34.0)
MCV: 93.2 fL (ref 78.0–100.0)
Platelets: 359 10*3/uL (ref 150–400)
RBC: 4.11 MIL/uL (ref 3.87–5.11)
RDW: 12.2 % (ref 11.5–15.5)

## 2011-06-26 LAB — ABO/RH: ABO/RH(D): A NEG

## 2011-06-26 LAB — SURGICAL PCR SCREEN
MRSA, PCR: POSITIVE — AB
Staphylococcus aureus: POSITIVE — AB

## 2011-06-26 LAB — PROTIME-INR: Prothrombin Time: 13.1 seconds (ref 11.6–15.2)

## 2011-06-27 LAB — VITAMIN D 25 HYDROXY (VIT D DEFICIENCY, FRACTURES): Vit D, 25-Hydroxy: 54 ng/mL (ref 30–89)

## 2011-06-29 ENCOUNTER — Other Ambulatory Visit (HOSPITAL_COMMUNITY): Payer: Medicare Other

## 2011-07-03 ENCOUNTER — Inpatient Hospital Stay (HOSPITAL_COMMUNITY): Payer: Medicare Other

## 2011-07-03 ENCOUNTER — Other Ambulatory Visit: Payer: Self-pay | Admitting: Orthopedic Surgery

## 2011-07-03 ENCOUNTER — Inpatient Hospital Stay (HOSPITAL_COMMUNITY)
Admission: RE | Admit: 2011-07-03 | Discharge: 2011-07-09 | DRG: 460 | Disposition: A | Discharge status: Skilled Nursing Facility | Payer: Medicare Other | Source: Ambulatory Visit | Attending: Orthopedic Surgery | Admitting: Orthopedic Surgery

## 2011-07-03 DIAGNOSIS — M47817 Spondylosis without myelopathy or radiculopathy, lumbosacral region: Secondary | ICD-10-CM | POA: Diagnosis present

## 2011-07-03 DIAGNOSIS — Q762 Congenital spondylolisthesis: Secondary | ICD-10-CM

## 2011-07-03 DIAGNOSIS — K219 Gastro-esophageal reflux disease without esophagitis: Secondary | ICD-10-CM | POA: Diagnosis present

## 2011-07-03 DIAGNOSIS — I10 Essential (primary) hypertension: Secondary | ICD-10-CM | POA: Diagnosis present

## 2011-07-03 DIAGNOSIS — M5126 Other intervertebral disc displacement, lumbar region: Principal | ICD-10-CM | POA: Diagnosis present

## 2011-07-03 DIAGNOSIS — Z472 Encounter for removal of internal fixation device: Secondary | ICD-10-CM

## 2011-07-03 DIAGNOSIS — M4310 Spondylolisthesis, site unspecified: Secondary | ICD-10-CM | POA: Diagnosis present

## 2011-07-03 DIAGNOSIS — Z981 Arthrodesis status: Secondary | ICD-10-CM

## 2011-07-03 DIAGNOSIS — K449 Diaphragmatic hernia without obstruction or gangrene: Secondary | ICD-10-CM | POA: Diagnosis present

## 2011-07-04 LAB — CBC
HCT: 27.7 % — ABNORMAL LOW (ref 36.0–46.0)
Hemoglobin: 9 g/dL — ABNORMAL LOW (ref 12.0–15.0)
MCH: 31.3 pg (ref 26.0–34.0)
MCHC: 32.5 g/dL (ref 30.0–36.0)
MCV: 96.2 fL (ref 78.0–100.0)
Platelets: 180 10*3/uL (ref 150–400)

## 2011-07-04 LAB — BASIC METABOLIC PANEL
BUN: 8 mg/dL (ref 6–23)
Chloride: 103 mEq/L (ref 96–112)
Creatinine, Ser: 0.65 mg/dL (ref 0.50–1.10)
GFR calc non Af Amer: 87 mL/min — ABNORMAL LOW (ref >90)
Glucose, Bld: 135 mg/dL — ABNORMAL HIGH (ref 70–99)
Potassium: 3.6 mEq/L (ref 3.5–5.1)

## 2011-07-05 LAB — CBC
Hemoglobin: 8.1 g/dL — ABNORMAL LOW (ref 12.0–15.0)
MCH: 31.4 pg (ref 26.0–34.0)
MCHC: 32.9 g/dL (ref 30.0–36.0)
RBC: 2.58 MIL/uL — ABNORMAL LOW (ref 3.87–5.11)
RDW: 12.2 % (ref 11.5–15.5)

## 2011-07-05 LAB — BASIC METABOLIC PANEL
BUN: 6 mg/dL (ref 6–23)
Calcium: 8.8 mg/dL (ref 8.4–10.5)
GFR calc non Af Amer: >90 mL/min (ref >90)
Glucose, Bld: 125 mg/dL — ABNORMAL HIGH (ref 70–99)
Sodium: 139 mEq/L (ref 135–145)

## 2011-07-06 LAB — BASIC METABOLIC PANEL
Calcium: 9 mg/dL (ref 8.4–10.5)
GFR calc Af Amer: >90 mL/min (ref >90)
GFR calc non Af Amer: >90 mL/min (ref >90)
Sodium: 141 mEq/L (ref 135–145)

## 2011-07-06 LAB — CBC
MCH: 31.6 pg (ref 26.0–34.0)
MCHC: 33.9 g/dL (ref 30.0–36.0)
Platelets: 187 10*3/uL (ref 150–400)

## 2011-07-07 LAB — MRSA PCR SCREENING: MRSA by PCR: NEGATIVE

## 2011-07-07 MED ORDER — VITAMIN D3 25 MCG (1000 UNIT) PO TABS
5000.0000 [IU] | ORAL_TABLET | Freq: Every day | ORAL | Status: DC
  Administered 2011-07-08: 5000 [IU] via ORAL
  Filled 2011-07-07 (×4): qty 5

## 2011-07-07 MED ORDER — PHENOL 1.4 % MT LIQD
1.0000 | OROMUCOSAL | Status: DC | PRN
  Filled 2011-07-07: qty 177

## 2011-07-07 MED ORDER — POTASSIUM CHLORIDE CRYS ER 20 MEQ PO TBCR
20.0000 meq | EXTENDED_RELEASE_TABLET | Freq: Two times a day (BID) | ORAL | Status: DC
  Administered 2011-07-08 – 2011-07-09 (×3): 20 meq via ORAL
  Filled 2011-07-07 (×2): qty 1

## 2011-07-07 MED ORDER — ACETAMINOPHEN 500 MG PO TABS
500.0000 mg | ORAL_TABLET | ORAL | Status: DC | PRN
  Administered 2011-07-08: 500 mg via ORAL
  Administered 2011-07-08: 1000 mg via ORAL
  Filled 2011-07-07: qty 2
  Filled 2011-07-07: qty 1

## 2011-07-07 MED ORDER — HYDROXYZINE HCL 50 MG PO TABS
50.0000 mg | ORAL_TABLET | ORAL | Status: DC | PRN
  Filled 2011-07-07: qty 1

## 2011-07-07 MED ORDER — ONDANSETRON HCL 4 MG/2ML IJ SOLN: 4.0000 mg | INTRAMUSCULAR | Status: DC | PRN

## 2011-07-07 MED ORDER — VERAPAMIL HCL ER 240 MG PO TBCR
240.0000 mg | EXTENDED_RELEASE_TABLET | Freq: Two times a day (BID) | ORAL | Status: DC
  Administered 2011-07-08 – 2011-07-09 (×3): 240 mg via ORAL
  Filled 2011-07-07 (×6): qty 1

## 2011-07-07 MED ORDER — ZOLPIDEM TARTRATE 10 MG PO TABS
10.0000 mg | ORAL_TABLET | Freq: Every day | ORAL | Status: DC
  Administered 2011-07-08: 10 mg via ORAL
  Filled 2011-07-07: qty 1

## 2011-07-07 MED ORDER — ALPRAZOLAM 0.5 MG PO TABS
0.5000 mg | ORAL_TABLET | Freq: Two times a day (BID) | ORAL | Status: DC
  Administered 2011-07-08 – 2011-07-09 (×3): 0.5 mg via ORAL
  Filled 2011-07-07 (×3): qty 1

## 2011-07-07 MED ORDER — BISACODYL 10 MG RE SUPP: 10.0000 mg | Freq: Every day | RECTAL | Status: DC | PRN

## 2011-07-07 MED ORDER — CALCIUM CARBONATE 1250 (500 CA) MG PO TABS
500.0000 mg | ORAL_TABLET | Freq: Every day | ORAL | Status: DC
  Administered 2011-07-08 – 2011-07-09 (×2): 500 mg via ORAL
  Filled 2011-07-07 (×3): qty 1

## 2011-07-07 MED ORDER — LOSARTAN POTASSIUM 50 MG PO TABS
50.0000 mg | ORAL_TABLET | Freq: Every day | ORAL | Status: DC
  Administered 2011-07-08 – 2011-07-09 (×2): 50 mg via ORAL
  Filled 2011-07-07 (×3): qty 1

## 2011-07-07 MED ORDER — FLUTICASONE PROPIONATE 50 MCG/ACT NA SUSP
2.0000 | Freq: Every day | NASAL | Status: DC
  Administered 2011-07-08: 2 via NASAL

## 2011-07-07 MED ORDER — ASPIRIN EC 81 MG PO TBEC
81.0000 mg | DELAYED_RELEASE_TABLET | Freq: Every day | ORAL | Status: DC
  Administered 2011-07-08 – 2011-07-09 (×2): 81 mg via ORAL
  Filled 2011-07-07 (×3): qty 1

## 2011-07-07 MED ORDER — B COMPLEX-C PO TABS
1.0000 | ORAL_TABLET | Freq: Every day | ORAL | Status: DC
  Administered 2011-07-08 – 2011-07-09 (×2): 1 via ORAL
  Filled 2011-07-07 (×3): qty 1

## 2011-07-07 MED ORDER — ACETAMINOPHEN 650 MG RE SUPP: 650.0000 mg | RECTAL | Status: DC | PRN

## 2011-07-07 MED ORDER — HYDROXYZINE HCL 50 MG/ML IM SOLN
50.0000 mg | INTRAMUSCULAR | Status: DC | PRN
  Filled 2011-07-07: qty 1

## 2011-07-07 MED ORDER — SENNA 8.6 MG PO TABS
1.0000 | ORAL_TABLET | Freq: Every evening | ORAL | Status: DC | PRN
  Filled 2011-07-07: qty 1

## 2011-07-07 MED ORDER — DOCUSATE SODIUM 100 MG PO CAPS
100.0000 mg | ORAL_CAPSULE | Freq: Two times a day (BID) | ORAL | Status: DC
  Administered 2011-07-09: 100 mg via ORAL
  Filled 2011-07-07 (×2): qty 1

## 2011-07-07 MED ORDER — PANTOPRAZOLE SODIUM 40 MG PO TBEC
40.0000 mg | DELAYED_RELEASE_TABLET | Freq: Two times a day (BID) | ORAL | Status: DC
  Administered 2011-07-08 – 2011-07-09 (×3): 40 mg via ORAL
  Filled 2011-07-07 (×3): qty 1

## 2011-07-07 MED ORDER — DULOXETINE HCL 60 MG PO CPEP
60.0000 mg | ORAL_CAPSULE | Freq: Every day | ORAL | Status: DC
  Administered 2011-07-08 – 2011-07-09 (×2): 60 mg via ORAL
  Filled 2011-07-07 (×3): qty 1

## 2011-07-07 MED ORDER — FENOFIBRATE 160 MG PO TABS
160.0000 mg | ORAL_TABLET | Freq: Every day | ORAL | Status: DC
  Administered 2011-07-08 – 2011-07-09 (×2): 160 mg via ORAL
  Filled 2011-07-07 (×3): qty 1

## 2011-07-07 MED ORDER — ALUM & MAG HYDROXIDE-SIMETH 400-400-40 MG/5ML PO SUSP
30.0000 mL | Freq: Four times a day (QID) | ORAL | Status: DC | PRN
  Filled 2011-07-07: qty 30

## 2011-07-07 MED ORDER — MENTHOL 3 MG MT LOZG: 1.0000 | LOZENGE | OROMUCOSAL | Status: DC | PRN

## 2011-07-07 NOTE — Plan of Care (Signed)
Problem: Phase II Progression Outcomes Goal: Verbalizes of donning/doffing brace Outcome: Completed in Legacy System Date Met:  07/07/11 Not applicable--per legacy system.  Problem: Phase III Progression Outcomes Goal: Demonstrates donning/doffing brace Outcome: Completed in Legacy System Date Met:  07/07/11 Not applicable per legacy system documentation.

## 2011-07-08 LAB — CBC
MCH: 30.7 pg (ref 26.0–34.0)
MCV: 92.6 fL (ref 78.0–100.0)
Platelets: 317 10*3/uL (ref 150–400)
RBC: 2.83 MIL/uL — ABNORMAL LOW (ref 3.87–5.11)
RDW: 12.2 % (ref 11.5–15.5)
WBC: 8.1 10*3/uL (ref 4.0–10.5)

## 2011-07-08 LAB — BASIC METABOLIC PANEL
CO2: 26 mEq/L (ref 19–32)
Calcium: 9 mg/dL (ref 8.4–10.5)
Chloride: 102 mEq/L (ref 96–112)
Creatinine, Ser: 0.52 mg/dL (ref 0.50–1.10)
GFR calc Af Amer: >90 mL/min (ref >90)
GFR calc non Af Amer: >90 mL/min (ref >90)
Sodium: 139 mEq/L (ref 135–145)

## 2011-07-08 MED ORDER — TRAMADOL HCL 50 MG PO TABS
50.0000 mg | ORAL_TABLET | Freq: Four times a day (QID) | ORAL | Status: DC | PRN
  Administered 2011-07-08: 100 mg via ORAL
  Filled 2011-07-08 (×2): qty 2
  Filled 2011-07-08: qty 1
  Filled 2011-07-08: qty 2

## 2011-07-08 MED ORDER — HYDROCHLOROTHIAZIDE 10 MG/ML ORAL SUSPENSION
6.2500 mg | Freq: Every day | ORAL | Status: DC
  Administered 2011-07-09 (×2): 6.25 mg via ORAL
  Filled 2011-07-08 (×2): qty 1.25

## 2011-07-08 NOTE — Progress Notes (Signed)
Physical Therapy Treatment Patient Details Name: [name] MRN: [id number] DOB: 1941-05-12 Today's Date: 07/08/2011  PT Assessment/Plan  PT - Assessment/Plan Comments on Treatment Session: much improved activity tolerance; likely able to dc home with husband;  PT Plan: Discharge plan needs to be updated;Equipment recommendations need to be updated PT Frequency: Min 5X/week Follow Up Recommendations: Home health PT (pt/husband will consider HHPT,they may decline) Equipment Recommended: 3 in 1 bedside comode PT Goals  Acute Rehab PT Goals PT Goal Formulation: With patient/family Time For Goal Achievement: 7 days Pt will Roll Supine to Right Side: with supervision (with correct log roll technique) PT Goal: Rolling Supine to Right Side - Progress: Progressing toward goal Pt will Roll Supine to Left Side: with supervision (and correct log roll technique) PT Goal: Rolling Supine to Left Side - Progress: Progressing toward goal Pt will Transfer Sit to Stand/Stand to Sit: with supervision PT Transfer Goal: Sit to Stand/Stand to Sit - Progress: Progressing toward goal Pt will Ambulate: >150 feet PT Goal: Ambulate - Progress: Progressing toward goal Pt will Go Up / Down Stairs: with supervision;6-9 stairs;with rail(s) PT Goal: Up/Down Stairs - Progress: Progressing toward goal  PT Treatment Precautions/Restrictions  Precautions Precautions: Back;Fall Precaution Booklet Issued: No Required Braces or Orthoses: No Mobility (including Balance) Bed Mobility Bed Mobility: Yes Rolling Right: 4: Min assist;With rail Rolling Right Details (indicate cue type and reason): cues for log roll Right Sidelying to Sit: 4: Min assist;HOB flat Right Sidelying to Sit Details (indicate cue type and reason): cues for back precautions Transfers Transfers: Yes Sit to Stand: 4: Min assist;With armrests;From bed;3: Mod assist Sit to Stand Details (indicate cue type and reason): mod assist (pt = 70%)  from low surface without using rails, in prep for dc home where pt tends to sit on low couch sans armrests Stand to Sit: 4: Min assist;With upper extremity assist Stand to Sit Details: cues for prec Ambulation/Gait Ambulation/Gait: Yes Ambulation/Gait Assistance: 4: Min assist Ambulation/Gait Assistance Details (indicate cue type and reason): close guard for safety as pt's right knee tending to buckle occasionally Ambulation Distance (Feet): 120 Feet Assistive device: Rolling walker Gait velocity: slow Stairs: Yes Stairs Assistance: 4: Min assist Stairs Assistance Details (indicate cue type and reason): verbal and demo cues for sequence and hand placement/ correct rail use Stair Management Technique: One rail Right;Sideways Number of Stairs: 5  Wheelchair Mobility Wheelchair Mobility: No  Posture/Postural Control Posture/Postural Control: No significant limitations Balance Balance Assessed: Yes (needs close guard as right knee buckles) Exercise    End of Session PT - End of Session Equipment Utilized During Treatment: Gait belt Activity Tolerance: Patient tolerated treatment well Patient left: in chair;with family/visitor present General Behavior During Session: [facility] for tasks performed Cognition: [facility] for tasks performed  Van Clines Hamff 07/08/2011, 3:01 PM

## 2011-07-08 NOTE — Progress Notes (Signed)
Subjective: A&O times 3.  Pain with active motion wants to restart medication from home.   Objective: Vital signs in last 24 hours: Temp:  [98.5 F (36.9 C)] 98.5 F (36.9 C) (11/04 0700) Pulse Rate:  [84] 84  (11/04 0700) Resp:  [18] 18  (11/04 0700) BP: (188)/(94) 188/94 mmHg (11/04 0700) SpO2:  [94 %] 94 % (11/04 0700)  Intake/Output from previous day: 11/03 0701 - 11/04 0700 In: 1987 [P.O.:1987] Out: 1001 [Urine:1000; Stool:1] Intake/Output this shift:     Basename 07/08/11 0540 07/06/11 0555  HGB 8.7* 8.0*    Basename 07/08/11 0540 07/06/11 0555  WBC 8.1 8.3  RBC 2.83* 2.53*  HCT 26.2* 23.6*  PLT 317 187    Basename 07/08/11 0540 07/06/11 0555  NA 139 141  K 3.4* 2.8*  CL 102 100  CO2 26 29  BUN 7 6  CREATININE 0.52 0.51  GLUCOSE 122* 130*  CALCIUM 9.0 9.0   No results found for this basename: LABPT:2,INR:2 in the last 72 hours  Neurovascular intact Sensation intact distally Intact pulses distally Dorsiflexion/Plantar flexion intact Incision: dressing C/D/I and no drainage  Assessment/Plan: PT for mobility and safety.  Plan to d/c home tomorrow if safe per PT.   [name],[name] 07/08/2011, 10:14 AM

## 2011-07-09 NOTE — Discharge Summary (Signed)
  Dictation discharge note [phone].

## 2011-07-09 NOTE — Discharge Summary (Unsigned)
[name], [name]             ACCOUNT NO.:  [id number]  MEDICAL RECORD NO.:  [id number]  LOCATION:  5008                         FACILITY:  [facility]  PHYSICIAN:  Lianne Cure, P.A.  DATE OF BIRTH:  10-12-1940  DATE OF ADMISSION:  07/03/2011 DATE OF DISCHARGE:                              DISCHARGE SUMMARY   FINAL DIAGNOSIS:  Spondylosis, lumbar stenosis status post L4-S1 fusion.  BRIEF HISTORY:  She was brought to [facility] a week ago from today's date.  Today's date is July 09, 2011.  She was admitted on July 06, 2011, underwent lumbar fusion from L2-L4 with laminectomy and interbody diskectomy at L3-4.  The patient was then admitted to 5004.  She has had an episode for the first 3 postop days of confusion. We decreased her narcotic pain medicine and placed her on nonnarcotic pain medicines to include tramadol 50 mg 1-2 q.6 h. p.r.n. for pain. The patient is now alert and oriented x3.  She is ambulating.  She has some coordination difficulties with turning to reach for something.  She forgets to get her walker for assistance.  She has been working with rehab to include strengthening, ambulation, and stairs.  The patient was ordered a rolling walker for home use for stability and safety.  Her vital signs are stable.  Her labs are within normal limits.  She is taking p.o. as well.  She is on a regular diet.  Distally, she is grossly neurovascularly motor intact.  Her incisions are healing well. There is no active drainage.  They are clean and dry.  There is no sign of infection.  Strength at [facility] and anterior tib is graded at 5/5. Abdomen sounds are positive on auscultation.  She reports no chest pain, no shortness of breath on exertion.  PLAN:  She will be discharged to an extended care skilled nursing facility for strengthening safety rehab.  She will be discharged on her current home medications, and she will follow up in our office to see Dr. Nelda Severe  in 4 weeks.  DISPOSITION:  Stable.  DIET:  Regular.  INSTRUCTIONS:  No bending, stooping, lifting.  She will lift no more than 5 pounds.     Lianne Cure, P.A.     MC/MEDQ  D:  07/09/2011  T:  07/09/2011  Job:  [id number]

## 2011-07-09 NOTE — Op Note (Signed)
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  OPERATIVE PROCEDURE:  Left laminectomy disk excision including removal of extruded fragment; transforaminal interbody fusion, L3-4 left; posterior and posterolateral fusion L2-3, L3-4; reinsertion of pedicle screws (including removal of bilateral L4 and L5 pedicle screws) at L3-4 and placement of pedicle screws bilaterally at L2 and L3; harvest right posterior iliac crest graft.  INDICATIONS FOR SURGERY:  Daily persistent and progressive back pain and left radicular pain, lower extremity.  OPERATIVE NOTE:  The patient was placed under general endotracheal anesthesia.  Vancomycin was infused intravenously as prophylaxis against infection.  Sequential compression devices were placed on both lower extremities.  A Foley catheter was placed in the bladder.  She was positioned prone on a Jackson table.  Care was taken to position the upper extremities so as to avoid hyperflexion and abduction of the shoulders and so as to avoid hyperflexion of the elbows.  The thighs, knees, shins and ankles were supported on pillows.  Prior to positioning the neuromonitoring technician placed electrodes on upper and lower extremities and  scalp.  Once positioned, I marked the previous midline incision with a skin marker, and extended the line of incision vertically in a proximal direction about 5-6 cm.  The lumbar area was then prepped with DuraPrep and draped in a rectangular fashion.  A time-out was held, at which point, the usual information was discussed/confirmed.  The incision was scored from approximately L1-2 proximally to the distal end of the incision already made for the L4-5 fusion performed previously.  The subcutaneous tissue was injected with a mixture of 0.25% plain Marcaine and 1% lidocaine with epinephrine.  Dissection was then carried down onto the spinous processes of L2 and L3.  A cross- table lateral radiograph was taken with a Kocher on the spinous process of what proved to be L3, the L4 spinous process having previously been removed.  We initially exposed the transverse processes of L2, L3, and the pedicle screws and rods placed at L4 and L5 on the right side.  The [employer] had not seen fit to equipoise with the instruments to remove the previously placed spinal concepts screws.  Therefore, a bolt cutter was used to cut the rod join and two screws and each screw was turned out without too much difficulty.  At this point, we harvested a right posterior iliac crest graft through a separate incision.  A short oblique incision was made over the right posterior iliac crest.  Dissection was carried down onto the  superior aspect of the crest, which was denuded of tissue.  We then used a graft retaining disposable cylindrical graft harvesting instrument to remove multiple course of cancellous bone from between the tables of the ilium. We then packed the bony defect with Gelfoam to control of bleeding.  The subcutaneous layer was then packed with an antibiotic soaked sponge, which was removed toward the end of the procedure at the time when we were about to close the incision.  We then placed  the graft in a small sleeve of Surgicel to prevent the graft from extruding in uncontrollable fashion.  It was then placed posterolaterally between the transverse processes of L2-3, and L3 and the fusion mass distally.  Just prior to doing that, we made pedicle holes at L2 and L3 (right side), these were tapped and measured for depth, carefully palpated circumferentially to make sure that there were no defects, screws measured and inserted after tapping of the holes.  We then placed 6.5-mm screw in the empty hole at L4 and a 7.5-mm diameter screw in the empty hole at L5.  We then contoured a rod and provisionally placed it from L2 proximally to L5 distally.  Set screws were placed, but not torqued.  Prior to placing the rod, we did stimulate each screw and distal EMG activity was recorded.  In no instance was the current sufficient to cause distal EMG stimulation at a low enough level that cause any concern as to whether or not there was contact between screw thread and nerve.  Therefore, at this point, we had screws and rod placed on the right at L2 through L5 with posterolateral graft placed lateral to the screws and rods.  I then switched the table sides and exposed the transverse processes on the left, and we then made pedicle holes at L2 and L3 in the similar fashion to that described for the right side.  We decorticated the transverse processes and proximal fusion mass and again a Surgicel was used to create a small sausage of bone graft, which was placed posterolaterally.  We also added to the bone graft taken from the iliac crest by removing the inferior articular processes at L3 and L2 and morselizing them.  Once the screws were placed at L2, L3, L4, and L5 as they had been on the right side, there were all stimulated with similar results.  We then placed a working rod between L3 and 4, and distracted it slightly.  I then completed a left-sided laminectomy starting at the  inferior edge of L3 and working proximally all the way through the lamina.  At this point, we had a complete inferior facetectomy and left-sided laminectomy at L3.  The L4 superior articular process was then removed and the veins in the neural foramen bipolar coagulated.  I then mobilized the proximal part of the L4 nerve root and dura medially.  It was somewhat adherent to the underlying disk herniation, which was previously visualized on the MRI scan and associated with the extruded fragment proximally (subligamentous).  The disk space was fenestrated and the disk space vigorously curetted.  Eventually using Epstein curettes, we were able to push most of the extruded fragment back into the disk and delivered with pituitary rongeurs.  We then prepared the endplates of the upper L4 and inferior L3 for interbody fusion.  At this point, it became apparent that there was significant distal compression (upper edge of the L4 lamina).  Dura was carefully dissected away from the  undersurface of the L4 lamina and the cottonoid patties placed and overlying bone removed to fully decompress the descending L4 nerve root in the lateral recess immediately medial to the left L4 pedicle.  We have chose a short titanium cage (Titan) 8 mm in height.  This was packed with bone graft.  We used a special funnel to place morcellized bone graft in the anterior portion of the L3-4 disk space.  The cage was then introduced and turned into a horizontal position to the extent possible.  We then loosened the screws on the working rod and the Titan implant was well gripped.  At this point, we noticed that there was a small arachnoid bubble immediately adjacent to the superior edge of the L4 lamina at the midline and perhaps slightly to the right of midline.  This was covered with a piece of Surgicel approximately 3 thicknesses without compressing the dura.  There was never any CSF noted nor any overt  leakage.  Lateral and PA radiographs were taken.  We did note a Ray-Tec sponge on the right side proximally on the PA radiograph and this was removed prior to closure.  Once the radiographs have been taken, we torqued all the couplings bilaterally.  We then placed the remaining graft posteriorly on the right side and in the L3-4 and L2-3 facet joints, which had been largely resected to obtain bone graft.  We did use a high- speed bur to remove the articular cartilage and subchondral bone from the facet joints bilaterally at L2-3 and L3-4.  We then placed a strip of Gelfoam over the posteriorly placed bone graft on the right side and another strip of Gelfoam over (superficial to) the laminectomy defect on the left side.  Vancomycin 1 g powder was dusted into the wound.  A 15-gauge Blake drain was placed subfascially on the right side and brought out through the skin and were secured with a 2-0 nylon suture.  The thoracolumbar facia was closed using continuous and interrupted #1 Vicryl suture.  An 8-inch Hemovac drain was brought from the subcutaneous layer of the bone graft harvest wound into the central wound and out through the skin to the right side where it also was secured with a 2-0 nylon suture.  The subcutaneous layer of both wounds was closed using interrupted inverted Vicryl sutures.  The skin of both wounds was closed using a running subcuticular 3-0 undyed Vicryl.  The skins were reinforced with Dermabond and Mepilex dressing was applied.  The blood loss estimated at 500 mL.  Amount of Cell Saver blood reinfused unknown at the time of dictation.  There were no intraoperative complications.  The patient was placed in her bed, awakened and returned to the recovery room.  In the recovery room when examined, immediately prior dictating this note, she was very somnolent and could not be adequately neurologically assessed.     Nelda Severe, MD     MT/MEDQ  D:   07/03/2011  T:  07/03/2011  Job:  [id number]  Electronically Signed by Nelda Severe MD on 07/09/2011 06:10:38 AM

## 2011-07-09 NOTE — Plan of Care (Signed)
Problem: Phase III Progression Outcomes Goal: Discharge plan remains appropriate-arrangements made Outcome: Progressing Recommend SNF for rehab to maximize I and safety prior to dc home; Pt and husband in agreement

## 2011-07-09 NOTE — Progress Notes (Signed)
Physical Therapy Treatment Patient Details Name: [name] MRN: [id number] DOB: 07/29/1941 Today's Date: 07/09/2011  PT Assessment/Plan  PT - Assessment/Plan Comments on Treatment Session:  (Discussed SNF for rehab to maximize safety prior to dc home ) PT Plan: Discharge plan needs to be updated PT Frequency: Min 5X/week Follow Up Recommendations: Skilled nursing facility Equipment Recommended: 3 in 1 bedside comode PT Goals  Acute Rehab PT Goals PT Goal: Rolling Supine to Right Side - Progress: Other (comment) PT Goal: Rolling Supine to Left Side - Progress: Other (comment) PT Transfer Goal: Sit to Stand/Stand to Sit - Progress: Progressing toward goal PT Goal: Ambulate - Progress: Progressing toward goal PT Goal: Up/Down Stairs - Progress: Progressing toward goal  PT Treatment Precautions/Restrictions  Precautions Precautions: Back;Fall Precaution Booklet Issued: No Precaution Comments:  (Right knee tends to buckle without warning) Required Braces or Orthoses: No Mobility (including Balance) Bed Mobility Bed Mobility: No Transfers Transfers: Yes Sit to Stand: 4: Min assist;From bed Sit to Stand Details (indicate cue type and reason): cues for safety, back prec, and hand placement Stand to Sit: 4: Min assist;With armrests;With upper extremity assist Stand to Sit Details: cues for back prec Ambulation/Gait Ambulation/Gait: Yes Ambulation/Gait Assistance: 4: Min assist (second person pushing chair behind for safety) Ambulation/Gait Assistance Details (indicate cue type and reason): noted one instance of R knee buckle Ambulation Distance (Feet): 130 Feet Assistive device: Rolling walker Gait Pattern: Step-through pattern Gait velocity: slow Stairs: Yes Stairs Assistance: 3: Mod assist Stairs Assistance Details (indicate cue type and reason): performed step-ups for R knee strengthening Stair Management Technique: One rail Left;Sideways Number of Stairs: 1    Wheelchair Mobility Wheelchair Mobility: No  Posture/Postural Control Posture/Postural Control: No significant limitations Balance Balance Assessed: No Exercise  Other Exercises Other Exercises: Step-up R LE times 5 reps; with close guard assist; fatigued quickly End of Session PT - End of Session Equipment Utilized During Treatment: Gait belt (around axillae because of back surgery) Activity Tolerance: Patient tolerated treatment well Patient left: in chair;with family/visitor present Nurse Communication: Mobility status for ambulation General Behavior During Session: [facility] for tasks performed Cognition: [facility] for tasks performed  Van Clines Hamff 07/09/2011, 11:36 AM

## 2011-07-09 NOTE — Progress Notes (Signed)
mepilex dsg intact x2 to back

## 2011-07-09 NOTE — Progress Notes (Signed)
Family has selected SNF bed at [facility] and will plan d/c today. Family plans to transport via car and is aware of copay @ SNF.

## 2011-07-16 ENCOUNTER — Telehealth: Payer: Self-pay | Admitting: *Deleted

## 2011-07-16 NOTE — Telephone Encounter (Signed)
Home health needs orders from Dr. Lovell Sheehan for PT and Home Health Aids ...Marland KitchenMarland KitchenMarland Kitchenrecent back surgery.  Please call.

## 2011-07-16 NOTE — Telephone Encounter (Signed)
They will send order to sign and fax back

## 2011-07-18 ENCOUNTER — Ambulatory Visit: Payer: Medicare Other | Admitting: Internal Medicine

## 2011-07-20 ENCOUNTER — Telehealth: Payer: Self-pay | Admitting: Internal Medicine

## 2011-07-20 NOTE — Telephone Encounter (Signed)
Pt was seen by home healthy yesterday morning and her blood pressure was 160/90. FYI

## 2011-07-20 NOTE — Telephone Encounter (Signed)
Dr jenkins informed 

## 2011-07-24 ENCOUNTER — Telehealth: Payer: Self-pay | Admitting: *Deleted

## 2011-07-24 NOTE — Telephone Encounter (Signed)
Home health nurse states pt's bp is 169/110- ate bacon yesterday- has taken bp meds as ordered

## 2011-07-24 NOTE — Telephone Encounter (Signed)
Per dr Lovell Sheehan no more bacon or ham products- talked with pt and she states bp is ok in pm it is just somewhat elevated in pm.will call back with readings

## 2011-08-01 ENCOUNTER — Ambulatory Visit (INDEPENDENT_AMBULATORY_CARE_PROVIDER_SITE_OTHER): Payer: Medicare Other | Admitting: Internal Medicine

## 2011-08-01 ENCOUNTER — Encounter: Payer: Self-pay | Admitting: Internal Medicine

## 2011-08-01 VITALS — BP 140/80 | HR 92 | Temp 98.2°F | Resp 16 | Ht 63.0 in | Wt 186.0 lb

## 2011-08-01 DIAGNOSIS — I10 Essential (primary) hypertension: Secondary | ICD-10-CM

## 2011-08-01 DIAGNOSIS — M549 Dorsalgia, unspecified: Secondary | ICD-10-CM

## 2011-08-01 DIAGNOSIS — K219 Gastro-esophageal reflux disease without esophagitis: Secondary | ICD-10-CM

## 2011-08-01 DIAGNOSIS — T887XXA Unspecified adverse effect of drug or medicament, initial encounter: Secondary | ICD-10-CM

## 2011-08-01 MED ORDER — PANTOPRAZOLE SODIUM 40 MG PO TBEC: 40.0000 mg | DELAYED_RELEASE_TABLET | Freq: Every day | ORAL | Status: DC

## 2011-08-01 NOTE — Patient Instructions (Signed)
The patient is instructed to continue all medications as prescribed. Schedule followup with check out clerk upon leaving the clinic  

## 2011-08-01 NOTE — Progress Notes (Signed)
Subjective:    Patient ID: [name], female    DOB: [birth year], 70 y.o.   MRN: [id number]  HPI  Has been able to reduce her use of pain medications to BID No edema Back pain post surgery improved Blood pressure stable Has been receiving home PT and the report was reviewed Mood stable   Review of Systems  Constitutional: Negative for activity change, appetite change and fatigue.  HENT: Positive for postnasal drip. Negative for ear pain, congestion, neck pain and sinus pressure.   Eyes: Negative for redness and visual disturbance.  Respiratory: Negative for cough, shortness of breath and wheezing.   Cardiovascular: Negative for leg swelling.  Gastrointestinal: Negative for abdominal pain and abdominal distention.  Genitourinary: Negative for dysuria, frequency and menstrual problem.  Musculoskeletal: Positive for back pain, joint swelling and gait problem. Negative for myalgias and arthralgias.  Skin: Negative for rash and wound.  Neurological: Negative for dizziness, weakness and headaches.  Hematological: Negative for adenopathy. Does not bruise/bleed easily.  Psychiatric/Behavioral: Negative for sleep disturbance and decreased concentration.   Past Medical History  Diagnosis Date  . Allergy   . Depression   . GERD (gastroesophageal reflux disease)   . Hyperlipidemia   . Hypertension   . Arthritis     History   Social History  . Marital Status: Married    Spouse Name: N/A    Number of Children: N/A  . Years of Education: N/A   Occupational History  . Not on file.   Social History Main Topics  . Smoking status: Never Smoker   . Smokeless tobacco: Not on file  . Alcohol Use: No  . Drug Use: No  . Sexually Active: Yes   Other Topics Concern  . Not on file   Social History Narrative  . No narrative on file    Past Surgical History  Procedure Date  . Lumbar laminectomy   . Lumbar fusion   . Gastric surgeryobesity type and hh surgeon matt martin       Family History  Problem Relation Age of Onset  . Heart disease Mother   . Heart disease Father   . Diabetes Paternal Grandfather     Allergies  Allergen Reactions  . Prednisone   . Thiazide-Type Diuretics Other (See Comments)    "could not physically move"    Current Outpatient Prescriptions on File Prior to Visit  Medication Sig Dispense Refill  . acetaminophen (TYLENOL) 500 MG tablet Take 500 mg by mouth every 6 (six) hours as needed.        . ALPRAZolam (XANAX XR) 1 MG 24 hr tablet Take 1 mg by mouth daily.        Marland Kitchen aspirin EC 81 MG tablet Take 81 mg by mouth daily.        . B Complex-C (SUPER B COMPLEX/VITAMIN C PO) Take 1 tablet by mouth daily.        . calcium carbonate (OS-CAL - DOSED IN MG OF ELEMENTAL CALCIUM) 1250 MG tablet Take 1 tablet by mouth daily.        . cholecalciferol (VITAMIN D) 1000 UNITS tablet Take 5,000 Units by mouth daily.        . Choline Fenofibrate (TRILIPIX) 135 MG capsule Take 135 mg by mouth at bedtime.        . DULoxetine (CYMBALTA) 60 MG capsule Take 60 mg by mouth daily.       . fluticasone (FLONASE) 50 MCG/ACT nasal spray Place 2 sprays into  the nose at bedtime.       Marland Kitchen losartan-hydrochlorothiazide (HYZAAR) 100-12.5 MG per tablet Take 0.5 tablets by mouth daily.        . meloxicam (MOBIC) 15 MG tablet Take 15 mg by mouth daily.        . metaxalone (SKELAXIN) 800 MG tablet Take 800 mg by mouth 3 (three) times daily.        . pantoprazole (PROTONIX) 40 MG tablet Take 1 tablet (40 mg total) by mouth daily.  30 tablet  11  . traMADol (ULTRAM) 50 MG tablet Take 100 mg by mouth 3 (three) times daily. Maximum dose= 8 tablets per day       . verapamil (CALAN-SR) 240 MG CR tablet Take 240 mg by mouth 2 (two) times daily.        Marland Kitchen zolpidem (AMBIEN) 10 MG tablet Take 10 mg by mouth at bedtime.          BP 140/80  Pulse 92  Temp 98.2 F (36.8 C)  Resp 16  Ht 5\' 3"  (1.6 m)  Wt 186 lb (84.369 kg)  BMI 32.95 kg/m2        Objective:    Physical Exam  Nursing note and vitals reviewed. Constitutional: She is oriented to person, place, and time. She appears well-developed and well-nourished. No distress.  HENT:  Head: Normocephalic and atraumatic.  Right Ear: External ear normal.  Left Ear: External ear normal.  Nose: Nose normal.  Mouth/Throat: Oropharynx is clear and moist.  Eyes: Conjunctivae and EOM are normal. Pupils are equal, round, and reactive to light.  Neck: Normal range of motion. Neck supple. No JVD present. No tracheal deviation present. No thyromegaly present.  Cardiovascular: Normal rate, regular rhythm, normal heart sounds and intact distal pulses.   No murmur heard. Pulmonary/Chest: Effort normal and breath sounds normal. She has no wheezes. She exhibits no tenderness.  Abdominal: Soft. Bowel sounds are normal.  Musculoskeletal: Normal range of motion. She exhibits no edema and no tenderness.  Lymphadenopathy:    She has no cervical adenopathy.  Neurological: She is alert and oriented to person, place, and time. She has normal reflexes. No cranial nerve deficit.  Skin: Skin is warm and dry. She is not diaphoretic.  Psychiatric: She has a normal mood and affect. Her behavior is normal.          Assessment & Plan:  The patient's blood pressure is stable her current medications.  Her back pain has improved and she has been able to wean herself from the pain medications.  She has not returned to work and has not been released by her Careers adviser.  She has increased depression secondary to financial stresses her husband's chronic illness and the fact that she has not been able to return to work yet.  We discussed compliance with her medications reviewed recent blood work and set followup edema

## 2011-08-02 ENCOUNTER — Telehealth: Payer: Self-pay | Admitting: *Deleted

## 2011-08-02 NOTE — Telephone Encounter (Signed)
Catrice from home health is calling to see if the face-to-face form is ready for the patient.  If so please fax to [phone]

## 2011-08-03 NOTE — Telephone Encounter (Signed)
Left message on machine For catrice-must get face to face from surgeon- they are the ones that ordered home health and equipment-we dont know what she needed

## 2011-08-24 ENCOUNTER — Ambulatory Visit (INDEPENDENT_AMBULATORY_CARE_PROVIDER_SITE_OTHER): Payer: Medicare Other | Admitting: Family Medicine

## 2011-08-24 ENCOUNTER — Encounter: Payer: Self-pay | Admitting: Family Medicine

## 2011-08-24 VITALS — BP 140/82 | HR 112 | Wt 182.0 lb

## 2011-08-24 DIAGNOSIS — J40 Bronchitis, not specified as acute or chronic: Secondary | ICD-10-CM

## 2011-08-24 DIAGNOSIS — J0100 Acute maxillary sinusitis, unspecified: Secondary | ICD-10-CM

## 2011-08-24 MED ORDER — AMOXICILLIN 500 MG PO CAPS: 1000.0000 mg | ORAL_CAPSULE | Freq: Two times a day (BID) | ORAL | Status: AC

## 2011-08-24 NOTE — Patient Instructions (Signed)

## 2011-08-24 NOTE — Progress Notes (Signed)
Subjective:    Patient ID: [name], female    DOB: 1940/09/28, 70 y.o.   MRN: [id number]  HPI 70 year old white female, nonsmoker, patient of Dr. Lovell Sheehan, is in today with one-week sinus pressure, sneezing, cough and congestion. Cough is nonproductive. She is in Mucinex over-the-counter with no relief. Reports being exposed to illness at church.  Review of Systems  Constitutional: Negative.   HENT: Positive for congestion, sneezing and sinus pressure.   Eyes: Negative.   Respiratory: Positive for cough.   Cardiovascular: Negative.   Gastrointestinal: Negative.   Skin: Negative.   Neurological: Negative.   Hematological: Negative.    Past Medical History  Diagnosis Date  . Allergy   . Depression   . GERD (gastroesophageal reflux disease)   . Hyperlipidemia   . Hypertension   . Arthritis     History   Social History  . Marital Status: Married    Spouse Name: N/A    Number of Children: N/A  . Years of Education: N/A   Occupational History  . Not on file.   Social History Main Topics  . Smoking status: Never Smoker   . Smokeless tobacco: Not on file  . Alcohol Use: No  . Drug Use: No  . Sexually Active: Yes   Other Topics Concern  . Not on file   Social History Narrative  . No narrative on file    Past Surgical History  Procedure Date  . Lumbar laminectomy   . Lumbar fusion   . Gastric surgeryobesity type and hh surgeon matt martin     Family History  Problem Relation Age of Onset  . Heart disease Mother   . Heart disease Father   . Diabetes Paternal Grandfather     Allergies  Allergen Reactions  . Prednisone   . Thiazide-Type Diuretics Other (See Comments)    "could not physically move"    Current Outpatient Prescriptions on File Prior to Visit  Medication Sig Dispense Refill  . acetaminophen (TYLENOL) 500 MG tablet Take 500 mg by mouth every 6 (six) hours as needed.        . ALPRAZolam (XANAX XR) 1 MG 24 hr tablet Take 1 mg by mouth  daily.        Marland Kitchen aspirin EC 81 MG tablet Take 81 mg by mouth daily.        . B Complex-C (SUPER B COMPLEX/VITAMIN C PO) Take 1 tablet by mouth daily.        . calcium carbonate (OS-CAL - DOSED IN MG OF ELEMENTAL CALCIUM) 1250 MG tablet Take 1 tablet by mouth daily.        . cholecalciferol (VITAMIN D) 1000 UNITS tablet Take 5,000 Units by mouth daily.        . Choline Fenofibrate (TRILIPIX) 135 MG capsule Take 135 mg by mouth at bedtime.        . DULoxetine (CYMBALTA) 60 MG capsule Take 60 mg by mouth daily.       . fluticasone (FLONASE) 50 MCG/ACT nasal spray Place 2 sprays into the nose at bedtime.       Marland Kitchen losartan-hydrochlorothiazide (HYZAAR) 100-12.5 MG per tablet Take 0.5 tablets by mouth daily.        . meloxicam (MOBIC) 15 MG tablet Take 15 mg by mouth daily.        . metaxalone (SKELAXIN) 800 MG tablet Take 800 mg by mouth 3 (three) times daily.        . pantoprazole (PROTONIX) 40  MG tablet Take 1 tablet (40 mg total) by mouth daily.  30 tablet  11  . traMADol (ULTRAM) 50 MG tablet Take 100 mg by mouth 3 (three) times daily. Maximum dose= 8 tablets per day       . verapamil (CALAN-SR) 240 MG CR tablet Take 240 mg by mouth 2 (two) times daily.        Marland Kitchen zolpidem (AMBIEN) 10 MG tablet Take 10 mg by mouth at bedtime.          BP 140/82  Pulse 112  Wt 182 lb (82.555 kg)chart    Objective:   Physical Exam  Constitutional: She is oriented to person, place, and time. She appears well-developed and well-nourished.  HENT:  Left Ear: External ear normal.  Neck: Normal range of motion.  Cardiovascular: Normal rate, regular rhythm and normal heart sounds.   Pulmonary/Chest: Effort normal and breath sounds normal.  Musculoskeletal: Normal range of motion.  Neurological: She is alert and oriented to person, place, and time.  Skin: Skin is warm and dry.          Assessment & Plan:  Assessment: Acute sinusitis, bronchitis  Plan: Continue Mucinex over-the-counter as directed.  Amoxicillin 500 mg 2 capsules by mouth twice a day x10 days rest. Drink plenty of fluids. Call the office if symptoms worsen or persist. Recheck as scheduled and when necessary.

## 2011-08-29 ENCOUNTER — Other Ambulatory Visit: Payer: Self-pay | Admitting: Internal Medicine

## 2011-11-02 ENCOUNTER — Ambulatory Visit (INDEPENDENT_AMBULATORY_CARE_PROVIDER_SITE_OTHER): Payer: Medicare Other | Admitting: Internal Medicine

## 2011-11-02 ENCOUNTER — Encounter: Payer: Self-pay | Admitting: Internal Medicine

## 2011-11-02 VITALS — BP 120/70 | HR 72 | Temp 98.6°F | Resp 16 | Ht 63.0 in | Wt 186.0 lb

## 2011-11-02 DIAGNOSIS — D649 Anemia, unspecified: Secondary | ICD-10-CM

## 2011-11-02 DIAGNOSIS — F411 Generalized anxiety disorder: Secondary | ICD-10-CM

## 2011-11-02 DIAGNOSIS — F419 Anxiety disorder, unspecified: Secondary | ICD-10-CM

## 2011-11-02 DIAGNOSIS — I10 Essential (primary) hypertension: Secondary | ICD-10-CM

## 2011-11-02 LAB — CBC WITH DIFFERENTIAL/PLATELET
Basophils Absolute: 0.1 10*3/uL (ref 0.0–0.1)
HCT: 35.6 % — ABNORMAL LOW (ref 36.0–46.0)
Lymphs Abs: 3 10*3/uL (ref 0.7–4.0)
MCV: 91.1 fl (ref 78.0–100.0)
Monocytes Absolute: 0.7 10*3/uL (ref 0.1–1.0)
Monocytes Relative: 8.5 % (ref 3.0–12.0)
Neutrophils Relative %: 52 % (ref 43.0–77.0)
Platelets: 361 10*3/uL (ref 150.0–400.0)
RDW: 14 % (ref 11.5–14.6)
WBC: 8.2 10*3/uL (ref 4.5–10.5)

## 2011-11-02 MED ORDER — CHOLINE FENOFIBRATE 135 MG PO CPDR: 135.0000 mg | DELAYED_RELEASE_CAPSULE | Freq: Every day | ORAL | Status: DC

## 2011-11-02 MED ORDER — ALPRAZOLAM 0.25 MG PO TABS: 0.2500 mg | ORAL_TABLET | Freq: Two times a day (BID) | ORAL | Status: AC | PRN

## 2011-11-02 MED ORDER — ZOLPIDEM TARTRATE 10 MG PO TABS: 10.0000 mg | ORAL_TABLET | Freq: Every day | ORAL | Status: DC

## 2011-11-02 MED ORDER — TH FISH OIL EXTRA STRENGTH 1200 MG PO CAPS: 1.0000 | ORAL_CAPSULE | Freq: Two times a day (BID) | ORAL | Status: DC

## 2011-11-02 NOTE — Patient Instructions (Signed)
The patient is instructed to continue all medications as prescribed. Schedule followup with check out clerk upon leaving the clinic  

## 2011-11-02 NOTE — Progress Notes (Signed)
  Subjective:    Patient ID: [name], female    DOB: December 17, 1940, 71 y.o.   MRN: [id number]  HPI Patient is a complicated 71 year old white female who continues to work part-time at [employer]. Her functioning appears to be stable her mentation is stable she does have increased anxiety because of her husband's ongoing health issues and I believe there are financial issues. Her blood pressure appears to be well-controlled on her current medications she is taking a fenofibrate and official to control her cholesterol because she is intolerant of statins.  She is currently taking Cymbalta 60 mg by mouth daily for depression and alprazolam for anxiety she is requested a longer acting anxiety medication because she feels the operating room "wear off" and has rebound increased anxiety   Review of Systems  Constitutional: Negative for activity change, appetite change and fatigue.  HENT: Negative for ear pain, congestion, neck pain, postnasal drip and sinus pressure.   Eyes: Negative for redness and visual disturbance.  Respiratory: Negative for cough, shortness of breath and wheezing.   Gastrointestinal: Negative for abdominal pain and abdominal distention.  Genitourinary: Negative for dysuria, frequency and menstrual problem.  Musculoskeletal: Negative for myalgias, joint swelling and arthralgias.  Skin: Negative for rash and wound.  Neurological: Negative for dizziness, weakness and headaches.  Hematological: Negative for adenopathy. Does not bruise/bleed easily.  Psychiatric/Behavioral: Negative for sleep disturbance and decreased concentration.       Objective:   Physical Exam  Constitutional: She is oriented to person, place, and time. She appears well-developed and well-nourished. No distress.  HENT:  Head: Normocephalic and atraumatic.  Right Ear: External ear normal.  Left Ear: External ear normal.  Nose: Nose normal.  Mouth/Throat: Oropharynx is clear and moist.  Eyes:  Conjunctivae and EOM are normal. Pupils are equal, round, and reactive to light.  Neck: Normal range of motion. Neck supple. No JVD present. No tracheal deviation present. No thyromegaly present.  Cardiovascular: Normal rate, regular rhythm, normal heart sounds and intact distal pulses.   No murmur heard. Pulmonary/Chest: Effort normal and breath sounds normal. She has no wheezes. She exhibits no tenderness.  Abdominal: Soft. Bowel sounds are normal.  Musculoskeletal: Normal range of motion. She exhibits no edema and no tenderness.  Lymphadenopathy:    She has no cervical adenopathy.  Neurological: She is alert and oriented to person, place, and time. She has normal reflexes. No cranial nerve deficit.  Skin: Skin is warm and dry. She is not diaphoretic.  Psychiatric: She has a normal mood and affect. Her behavior is normal.          Assessment & Plan:  We will prescribe extended-release alprazolam to see if this can better control her anxiety as well as discuss counseling and coping mechanisms for dealing with her husband's chronic disease.  She is to continue her medications for her cholesterol and we'll monitor at her next visit.  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

## 2011-11-21 ENCOUNTER — Other Ambulatory Visit: Payer: Self-pay | Admitting: Internal Medicine

## 2011-11-28 ENCOUNTER — Encounter: Payer: Self-pay | Admitting: Internal Medicine

## 2012-01-20 ENCOUNTER — Other Ambulatory Visit: Payer: Self-pay | Admitting: Internal Medicine

## 2012-02-04 ENCOUNTER — Ambulatory Visit: Payer: Medicare Other | Admitting: Internal Medicine

## 2012-02-20 ENCOUNTER — Other Ambulatory Visit: Payer: Self-pay | Admitting: Internal Medicine

## 2012-03-25 ENCOUNTER — Other Ambulatory Visit: Payer: Self-pay | Admitting: Internal Medicine

## 2012-03-27 ENCOUNTER — Ambulatory Visit (INDEPENDENT_AMBULATORY_CARE_PROVIDER_SITE_OTHER): Payer: Medicare Other | Admitting: Internal Medicine

## 2012-03-27 ENCOUNTER — Encounter: Payer: Self-pay | Admitting: Internal Medicine

## 2012-03-27 VITALS — BP 130/80 | HR 72 | Temp 98.6°F | Resp 16 | Ht 63.0 in | Wt 184.0 lb

## 2012-03-27 DIAGNOSIS — I10 Essential (primary) hypertension: Secondary | ICD-10-CM

## 2012-03-27 DIAGNOSIS — G4700 Insomnia, unspecified: Secondary | ICD-10-CM

## 2012-03-27 DIAGNOSIS — T887XXA Unspecified adverse effect of drug or medicament, initial encounter: Secondary | ICD-10-CM

## 2012-03-27 DIAGNOSIS — E785 Hyperlipidemia, unspecified: Secondary | ICD-10-CM

## 2012-03-27 LAB — BASIC METABOLIC PANEL
BUN: 15 mg/dL (ref 6–23)
CO2: 28 mEq/L (ref 19–32)
Chloride: 102 mEq/L (ref 96–112)
Creatinine, Ser: 0.7 mg/dL (ref 0.4–1.2)
Glucose, Bld: 99 mg/dL (ref 70–99)
Potassium: 3.8 mEq/L (ref 3.5–5.1)

## 2012-03-27 LAB — HEPATIC FUNCTION PANEL
ALT: 13 U/L (ref 0–35)
AST: 16 U/L (ref 0–37)
Albumin: 4.2 g/dL (ref 3.5–5.2)

## 2012-03-27 LAB — LIPID PANEL
Cholesterol: 185 mg/dL (ref 0–200)
Triglycerides: 108 mg/dL (ref 0.0–149.0)
VLDL: 21.6 mg/dL (ref 0.0–40.0)

## 2012-03-27 LAB — TSH: TSH: 1.73 u[IU]/mL (ref 0.35–5.50)

## 2012-03-27 MED ORDER — ZOLPIDEM TARTRATE 10 MG PO TABS: 10.0000 mg | ORAL_TABLET | Freq: Every day | ORAL | Status: DC

## 2012-03-27 NOTE — Patient Instructions (Addendum)
The patient is instructed to continue all medications as prescribed. Schedule followup with check out clerk upon leaving the clinic bBack Exercises Back exercises help treat and prevent back injuries. The goal of back exercises is to increase the strength of your abdominal and back muscles and the flexibility of your back. These exercises should be started when you no longer have back pain. Back exercises include:  Pelvic Tilt. Lie on your back with your knees bent. Tilt your pelvis until the lower part of your back is against the floor. Hold this position 5 to 10 sec and repeat 5 to 10 times.   Knee to Chest. Pull first 1 knee up against your chest and hold for 20 to 30 seconds, repeat this with the other knee, and then both knees. This may be done with the other leg straight or bent, whichever feels better.   Sit-Ups or Curl-Ups. Bend your knees 90 degrees. Start with tilting your pelvis, and do a partial, slow sit-up, lifting your trunk only 30 to 45 degrees off the floor. Take at least 2 to 3 seconds for each sit-up. Do not do sit-ups with your knees out straight. If partial sit-ups are difficult, simply do the above but with only tightening your abdominal muscles and holding it as directed.   Hip-Lift. Lie on your back with your knees flexed 90 degrees. Push down with your feet and shoulders as you raise your hips a couple inches off the floor; hold for 10 seconds, repeat 5 to 10 times.   Back arches. Lie on your stomach, propping yourself up on bent elbows. Slowly press on your hands, causing an arch in your low back. Repeat 3 to 5 times. Any initial stiffness and discomfort should lessen with repetition over time.   Shoulder-Lifts. Lie face down with arms beside your body. Keep hips and torso pressed to floor as you slowly lift your head and shoulders off the floor.  Do not overdo your exercises, especially in the beginning. Exercises may cause you some mild back discomfort which lasts for a  few minutes; however, if the pain is more severe, or lasts for more than 15 minutes, do not continue exercises until you see your caregiver. Improvement with exercise therapy for back problems is slow.  See your caregivers for assistance with developing a proper back exercise program. Document Released: 09/27/2004 Document Revised: 08/09/2011 Document Reviewed: 08/20/2005 [facility] Patient Information 2012 [employer] [place], [place].

## 2012-03-27 NOTE — Progress Notes (Signed)
Subjective:    Patient ID: [name], female    DOB: December 14, 1940, 71 y.o.   MRN: [id number]  HPI Stable blood pressure Feeling good Has been in the "donut hole" Stable mood and Blood pressure monitoring of lipids Hx of anemia    Review of Systems  Constitutional: Negative for activity change, appetite change and fatigue.  HENT: Negative for ear pain, congestion, neck pain, postnasal drip and sinus pressure.   Eyes: Negative for redness and visual disturbance.  Respiratory: Negative for cough, shortness of breath and wheezing.   Gastrointestinal: Negative for abdominal pain and abdominal distention.  Genitourinary: Negative for dysuria, frequency and menstrual problem.  Musculoskeletal: Negative for myalgias, joint swelling and arthralgias.  Skin: Negative for rash and wound.  Neurological: Negative for dizziness, weakness and headaches.  Hematological: Negative for adenopathy. Does not bruise/bleed easily.  Psychiatric/Behavioral: Negative for disturbed wake/sleep cycle and decreased concentration.   Past Medical History  Diagnosis Date  . Allergy   . Depression   . GERD (gastroesophageal reflux disease)   . Hyperlipidemia   . Hypertension   . Arthritis     History   Social History  . Marital Status: Married    Spouse Name: N/A    Number of Children: N/A  . Years of Education: N/A   Occupational History  . Not on file.   Social History Main Topics  . Smoking status: Never Smoker   . Smokeless tobacco: Not on file  . Alcohol Use: No  . Drug Use: No  . Sexually Active: Yes   Other Topics Concern  . Not on file   Social History Narrative  . No narrative on file    Past Surgical History  Procedure Date  . Lumbar laminectomy   . Lumbar fusion   . Gastric surgeryobesity type and hh surgeon matt martin     Family History  Problem Relation Age of Onset  . Heart disease Mother   . Heart disease Father   . Diabetes Paternal Grandfather      Allergies  Allergen Reactions  . Prednisone   . Thiazide-Type Diuretics Other (See Comments)    "could not physically move"    Current Outpatient Prescriptions on File Prior to Visit  Medication Sig Dispense Refill  . acetaminophen (TYLENOL) 500 MG tablet Take 500 mg by mouth every 6 (six) hours as needed.        . ALPRAZolam (XANAX XR) 1 MG 24 hr tablet Take 1 mg by mouth daily.        Marland Kitchen aspirin EC 81 MG tablet Take 81 mg by mouth daily.        . B Complex-C (SUPER B COMPLEX/VITAMIN C PO) Take 1 tablet by mouth daily.        . calcium carbonate (OS-CAL - DOSED IN MG OF ELEMENTAL CALCIUM) 1250 MG tablet Take 1 tablet by mouth daily.        . cholecalciferol (VITAMIN D) 1000 UNITS tablet Take 5,000 Units by mouth daily.        . Choline Fenofibrate (TRILIPIX) 135 MG capsule Take 1 capsule (135 mg total) by mouth daily.  30 capsule  11  . DULoxetine (CYMBALTA) 60 MG capsule Take 60 mg by mouth daily.       . fluticasone (FLONASE) 50 MCG/ACT nasal spray Place 2 sprays into the nose at bedtime.       Marland Kitchen losartan-hydrochlorothiazide (HYZAAR) 100-12.5 MG per tablet Take 0.5 tablets by mouth daily.        Marland Kitchen  meloxicam (MOBIC) 15 MG tablet Take 15 mg by mouth daily.        . Omega-3 Fatty Acids (TH FISH OIL EXTRA STRENGTH) 1200 MG CAPS Take 1 capsule by mouth 2 (two) times daily.      . pantoprazole (PROTONIX) 40 MG tablet Take 1 tablet (40 mg total) by mouth daily.  30 tablet  11  . SKELAXIN 800 MG tablet TAKE 1 TABLET 3 TIMES A DAY AS NEEDED.  90 each  0  . traMADol (ULTRAM) 50 MG tablet TAKE 1 OR 2 TABLETS EVERY EIGHT HOURS AS NEEDED FOR PAIN.  120 tablet  3  . verapamil (CALAN-SR) 240 MG CR tablet TAKE 1 TABLET TWICE DAILY.  60 tablet  11  . zolpidem (AMBIEN) 10 MG tablet Take 1 tablet (10 mg total) by mouth at bedtime.  30 tablet  3    BP 130/80  Pulse 72  Temp 98.6 F (37 C)  Resp 16  Ht 5\' 3"  (1.6 m)  Wt 184 lb (83.462 kg)  BMI 32.59 kg/m2        Objective:   Physical  Exam  Constitutional: She is oriented to person, place, and time. She appears well-developed and well-nourished. No distress.  HENT:  Head: Normocephalic and atraumatic.  Right Ear: External ear normal.  Left Ear: External ear normal.  Nose: Nose normal.  Mouth/Throat: Oropharynx is clear and moist.  Eyes: Conjunctivae and EOM are normal. Pupils are equal, round, and reactive to light.  Neck: Normal range of motion. Neck supple. No JVD present. No tracheal deviation present. No thyromegaly present.  Cardiovascular: Normal rate, regular rhythm, normal heart sounds and intact distal pulses.   No murmur heard. Pulmonary/Chest: Effort normal and breath sounds normal. She has no wheezes. She exhibits no tenderness.  Abdominal: Soft. Bowel sounds are normal.  Musculoskeletal: Normal range of motion. She exhibits no edema and no tenderness.  Lymphadenopathy:    She has no cervical adenopathy.  Neurological: She is alert and oriented to person, place, and time. She has normal reflexes. No cranial nerve deficit.  Skin: Skin is warm and dry. She is not diaphoretic.  Psychiatric: She has a normal mood and affect. Her behavior is normal.          Assessment & Plan:  Patient requires monitoring today of lipid liver and the med she is on a diuretic and we should look at her renal function as well as her potassium levels she is on Trileptal for her cholesterol and we'll measure a lipid and a liver.  We'll also look at a TSH to make sure that her thyroid is well adjusted blood pressure is stable her current medications.  Refills were accomplished

## 2012-04-01 ENCOUNTER — Other Ambulatory Visit: Payer: Self-pay | Admitting: *Deleted

## 2012-04-01 ENCOUNTER — Other Ambulatory Visit: Payer: Self-pay | Admitting: Internal Medicine

## 2012-05-01 ENCOUNTER — Other Ambulatory Visit: Payer: Self-pay | Admitting: Internal Medicine

## 2012-05-26 ENCOUNTER — Ambulatory Visit (INDEPENDENT_AMBULATORY_CARE_PROVIDER_SITE_OTHER): Payer: Medicare Other | Admitting: Family

## 2012-05-26 ENCOUNTER — Encounter: Payer: Self-pay | Admitting: Family

## 2012-05-26 ENCOUNTER — Ambulatory Visit (INDEPENDENT_AMBULATORY_CARE_PROVIDER_SITE_OTHER): Payer: Medicare Other

## 2012-05-26 VITALS — BP 140/96 | HR 87 | Wt 190.0 lb

## 2012-05-26 DIAGNOSIS — M1990 Unspecified osteoarthritis, unspecified site: Secondary | ICD-10-CM

## 2012-05-26 DIAGNOSIS — E669 Obesity, unspecified: Secondary | ICD-10-CM

## 2012-05-26 DIAGNOSIS — M25569 Pain in unspecified knee: Secondary | ICD-10-CM

## 2012-05-26 DIAGNOSIS — Z23 Encounter for immunization: Secondary | ICD-10-CM

## 2012-05-26 MED ORDER — HYDROCODONE-ACETAMINOPHEN 5-500 MG PO TABS: 1.0000 | ORAL_TABLET | Freq: Three times a day (TID) | ORAL | Status: DC | PRN

## 2012-05-26 NOTE — Progress Notes (Signed)
Subjective:    Patient ID: [name], female    DOB: 01/25/1941, 71 y.o.   MRN: [id number]  HPI  71 year old white female, nonsmoker, patient of Dr. Cato Mulligan is in today with complaints of right knee pain. She's been taking tramadol and Mobic with no relief. She does not tolerate oral steroids. Denies any recent injury. But had a fall several years ago and has always had issues with her right knee. Has minimal swelling. She is requesting a joint injection.   Review of Systems  Constitutional: Negative.   HENT: Negative.   Respiratory: Negative.   Cardiovascular: Negative.   Musculoskeletal: Negative for joint swelling and gait problem.       Right knee pain  Skin: Negative.   Neurological: Negative.   Psychiatric/Behavioral: Negative.    Past Medical History  Diagnosis Date  . Allergy   . Depression   . GERD (gastroesophageal reflux disease)   . Hyperlipidemia   . Hypertension   . Arthritis     History   Social History  . Marital Status: Married    Spouse Name: N/A    Number of Children: N/A  . Years of Education: N/A   Occupational History  . Not on file.   Social History Main Topics  . Smoking status: Never Smoker   . Smokeless tobacco: Not on file  . Alcohol Use: No  . Drug Use: No  . Sexually Active: Yes   Other Topics Concern  . Not on file   Social History Narrative  . No narrative on file    Past Surgical History  Procedure Date  . Lumbar laminectomy   . Lumbar fusion   . Gastric surgeryobesity type and hh surgeon matt martin     Family History  Problem Relation Age of Onset  . Heart disease Mother   . Heart disease Father   . Diabetes Paternal Grandfather     Allergies  Allergen Reactions  . Prednisone   . Thiazide-Type Diuretics Other (See Comments)    "could not physically move"    Current Outpatient Prescriptions on File Prior to Visit  Medication Sig Dispense Refill  . acetaminophen (TYLENOL) 500 MG tablet Take 500 mg by  mouth every 6 (six) hours as needed.        . ALPRAZolam (XANAX XR) 1 MG 24 hr tablet Take 1 mg by mouth daily.        . AMBIEN 10 MG tablet TAKE 1 TABLET AT BEDTIME AS NEEDED.  30 each  5  . aspirin EC 81 MG tablet Take 81 mg by mouth daily.        . B Complex-C (SUPER B COMPLEX/VITAMIN C PO) Take 1 tablet by mouth daily.        . calcium carbonate (OS-CAL - DOSED IN MG OF ELEMENTAL CALCIUM) 1250 MG tablet Take 1 tablet by mouth daily.        . cholecalciferol (VITAMIN D) 1000 UNITS tablet Take 5,000 Units by mouth daily.        . Choline Fenofibrate (TRILIPIX) 135 MG capsule Take 1 capsule (135 mg total) by mouth daily.  30 capsule  11  . DULoxetine (CYMBALTA) 60 MG capsule Take 60 mg by mouth daily.       . fluticasone (FLONASE) 50 MCG/ACT nasal spray Place 2 sprays into the nose at bedtime.       Marland Kitchen losartan-hydrochlorothiazide (HYZAAR) 100-12.5 MG per tablet Take 0.5 tablets by mouth daily.        Marland Kitchen  meloxicam (MOBIC) 15 MG tablet Take 15 mg by mouth daily.        . Omega-3 Fatty Acids (TH FISH OIL EXTRA STRENGTH) 1200 MG CAPS Take 1 capsule by mouth 2 (two) times daily.      . pantoprazole (PROTONIX) 40 MG tablet Take 1 tablet (40 mg total) by mouth daily.  30 tablet  11  . SKELAXIN 800 MG tablet TAKE 1 TABLET 3 TIMES A DAY AS NEEDED.  90 each  0  . traMADol (ULTRAM) 50 MG tablet TAKE 1 OR 2 TABLETS EVERY EIGHT HOURS AS NEEDED FOR PAIN.  120 tablet  3  . verapamil (CALAN-SR) 240 MG CR tablet TAKE 1 TABLET TWICE DAILY.  60 tablet  11    BP 140/96  Pulse 87  Wt 190 lb (86.183 kg)  SpO2 98%chart   Objectand and ive:   Physical Exam  Constitutional: She is oriented to person, place, and time. She appears well-developed and well-nourished.  Neck: Normal range of motion. Neck supple.  Cardiovascular: Normal rate, regular rhythm and normal heart sounds.   Pulmonary/Chest: Effort normal and breath sounds normal.  Musculoskeletal:       Moderate crepitus. Mild edema.   Neurological: She is  alert and oriented to person, place, and time.  Skin: Skin is warm and dry.  Psychiatric: She has a normal mood and affect.      Under sterile technique: Right knee was cleansed with Betadine. 1/2 cc of lidocaine was injected into the joint space of the right knee, followed by 40mg  of depo medrol. Minimal bleeding. Patient tolerate the procedure well.     Assessment & Plan:  Assessment: Right knee pain, osteoarthritis  Plan: Call the office if symptoms worsen or persist. Vicodin when necessary for pain. Recheck a schedule, and when necessary.

## 2012-05-26 NOTE — Progress Notes (Signed)
Subjective:    Patient ID: [name], female    DOB: Jul 29, 1941, 71 y.o.   MRN: [id number]  HPI 71 year old AAF is in today with c/o urinary frequency and low back pain x 2 days. She has increased her water intake and cranberry juice. Her symptoms have improved. Denies any vaginal discharge or odor. Back pain is 6/10 but improving. Denies pain with movement  Patient has also been on phentermine prescribed by Dr. Lovell Sheehan. She is requesting a refill. She does not routinely exercise but attempts to follow-a healthy diet.    Review of Systems  Constitutional: Negative.   HENT: Negative.   Respiratory: Negative.   Cardiovascular: Negative.   Gastrointestinal: Negative.   Genitourinary: Positive for urgency and frequency. Negative for vaginal bleeding, vaginal discharge and vaginal pain.  Musculoskeletal: Positive for back pain.  Skin: Negative.   Neurological: Negative.   Hematological: Negative.   Psychiatric/Behavioral: Negative.    Past Medical History  Diagnosis Date  . Allergy   . Depression   . GERD (gastroesophageal reflux disease)   . Hyperlipidemia   . Hypertension   . Arthritis     History   Social History  . Marital Status: Married    Spouse Name: N/A    Number of Children: N/A  . Years of Education: N/A   Occupational History  . Not on file.   Social History Main Topics  . Smoking status: Never Smoker   . Smokeless tobacco: Not on file  . Alcohol Use: No  . Drug Use: No  . Sexually Active: Yes   Other Topics Concern  . Not on file   Social History Narrative  . No narrative on file    Past Surgical History  Procedure Date  . Lumbar laminectomy   . Lumbar fusion   . Gastric surgeryobesity type and hh surgeon matt martin     Family History  Problem Relation Age of Onset  . Heart disease Mother   . Heart disease Father   . Diabetes Paternal Grandfather     Allergies  Allergen Reactions  . Prednisone   . Thiazide-Type Diuretics  Other (See Comments)    "could not physically move"    Current Outpatient Prescriptions on File Prior to Visit  Medication Sig Dispense Refill  . acetaminophen (TYLENOL) 500 MG tablet Take 500 mg by mouth every 6 (six) hours as needed.        . ALPRAZolam (XANAX XR) 1 MG 24 hr tablet Take 1 mg by mouth daily.        . AMBIEN 10 MG tablet TAKE 1 TABLET AT BEDTIME AS NEEDED.  30 each  5  . aspirin EC 81 MG tablet Take 81 mg by mouth daily.        . B Complex-C (SUPER B COMPLEX/VITAMIN C PO) Take 1 tablet by mouth daily.        . calcium carbonate (OS-CAL - DOSED IN MG OF ELEMENTAL CALCIUM) 1250 MG tablet Take 1 tablet by mouth daily.        . cholecalciferol (VITAMIN D) 1000 UNITS tablet Take 5,000 Units by mouth daily.        . Choline Fenofibrate (TRILIPIX) 135 MG capsule Take 1 capsule (135 mg total) by mouth daily.  30 capsule  11  . DULoxetine (CYMBALTA) 60 MG capsule Take 60 mg by mouth daily.       . fluticasone (FLONASE) 50 MCG/ACT nasal spray Place 2 sprays into the nose at  bedtime.       Marland Kitchen losartan-hydrochlorothiazide (HYZAAR) 100-12.5 MG per tablet Take 0.5 tablets by mouth daily.        . meloxicam (MOBIC) 15 MG tablet Take 15 mg by mouth daily.        . Omega-3 Fatty Acids (TH FISH OIL EXTRA STRENGTH) 1200 MG CAPS Take 1 capsule by mouth 2 (two) times daily.      . pantoprazole (PROTONIX) 40 MG tablet Take 1 tablet (40 mg total) by mouth daily.  30 tablet  11  . SKELAXIN 800 MG tablet TAKE 1 TABLET 3 TIMES A DAY AS NEEDED.  90 each  0  . traMADol (ULTRAM) 50 MG tablet TAKE 1 OR 2 TABLETS EVERY EIGHT HOURS AS NEEDED FOR PAIN.  120 tablet  3  . verapamil (CALAN-SR) 240 MG CR tablet TAKE 1 TABLET TWICE DAILY.  60 tablet  11    BP 140/96  Pulse 87  Wt 190 lb (86.183 kg)  SpO2 98%chart    Objective:   Physical Exam  Constitutional: She is oriented to person, place, and time. She appears well-developed and well-nourished.  HENT:  Right Ear: External ear normal.  Left Ear:  External ear normal.  Nose: Nose normal.  Mouth/Throat: Oropharynx is clear and moist.  Neck: Normal range of motion. Neck supple.  Cardiovascular: Normal rate, regular rhythm and normal heart sounds.   Pulmonary/Chest: Effort normal and breath sounds normal.  Abdominal: Soft. Bowel sounds are normal.  Neurological: She is alert and oriented to person, place, and time.  Skin: Skin is warm and dry.  Psychiatric: She has a normal mood and affect.          Assessment & Plan:  Assessment: Obesity, Urinary Frequency, Hematuria  Plan: Urine culture sent. Bactrim DS twice a day sent to the pharmacy. Continue to drink plenty of fluids. Call the office if symptoms worsen or persist. Recheck as scheduled and as needed. See Dr. Lovell Sheehan in 1 month for recheck of weight-taking phentermine.

## 2012-05-28 ENCOUNTER — Other Ambulatory Visit: Payer: Self-pay | Admitting: Internal Medicine

## 2012-05-30 ENCOUNTER — Ambulatory Visit (INDEPENDENT_AMBULATORY_CARE_PROVIDER_SITE_OTHER): Payer: Medicare Other | Admitting: Family Medicine

## 2012-05-30 ENCOUNTER — Encounter: Payer: Self-pay | Admitting: Family Medicine

## 2012-05-30 VITALS — BP 130/78 | Temp 98.7°F | Wt 187.0 lb

## 2012-05-30 DIAGNOSIS — J069 Acute upper respiratory infection, unspecified: Secondary | ICD-10-CM

## 2012-05-30 DIAGNOSIS — J309 Allergic rhinitis, unspecified: Secondary | ICD-10-CM

## 2012-05-30 DIAGNOSIS — I10 Essential (primary) hypertension: Secondary | ICD-10-CM

## 2012-05-30 MED ORDER — FLUTICASONE PROPIONATE 50 MCG/ACT NA SUSP: 2.0000 | Freq: Every day | NASAL | Status: DC

## 2012-05-30 NOTE — Progress Notes (Signed)
Chief Complaint  Patient presents with  . Hypertension  . Dizziness    HPI:   Elevated Blood pressure: -reports when she checked her blood pressure last night it was a little high - can't remember value -a little high this morning of 145/95 -denies CP SOB, swelling, palpitations -taking BP meds as indicated  Allergies/Sinus Issues: -chronically has nasal issues and was on nasonex - but liked flonase better and wants to go back to this -symptoms: nasal congestion worse, cough , drainage -no fevers, SOB, nausea, vomiting  ROS: See pertinent positives and negatives per HPI.  Past Medical History  Diagnosis Date  . Allergy   . Depression   . GERD (gastroesophageal reflux disease)   . Hyperlipidemia   . Hypertension   . Arthritis     Family History  Problem Relation Age of Onset  . Heart disease Mother   . Heart disease Father   . Diabetes Paternal Grandfather     History   Social History  . Marital Status: Married    Spouse Name: N/A    Number of Children: N/A  . Years of Education: N/A   Social History Main Topics  . Smoking status: Never Smoker   . Smokeless tobacco: None  . Alcohol Use: No  . Drug Use: No  . Sexually Active: Yes   Other Topics Concern  . None   Social History Narrative  . None    Current outpatient prescriptions:acetaminophen (TYLENOL) 500 MG tablet, Take 500 mg by mouth every 6 (six) hours as needed.  , Disp: , Rfl: ;  AMBIEN 10 MG tablet, TAKE 1 TABLET AT BEDTIME AS NEEDED., Disp: 30 each, Rfl: 5;  aspirin EC 81 MG tablet, Take 81 mg by mouth daily.  , Disp: , Rfl: ;  calcium carbonate (OS-CAL - DOSED IN MG OF ELEMENTAL CALCIUM) 1250 MG tablet, Take 1 tablet by mouth daily.  , Disp: , Rfl:  cholecalciferol (VITAMIN D) 1000 UNITS tablet, Take 5,000 Units by mouth daily.  , Disp: , Rfl: ;  Choline Fenofibrate (TRILIPIX) 135 MG capsule, Take 1 capsule (135 mg total) by mouth daily., Disp: 30 capsule, Rfl: 11;  DULoxetine (CYMBALTA) 60 MG  capsule, Take 60 mg by mouth daily. , Disp: , Rfl: ;  fluticasone (FLONASE) 50 MCG/ACT nasal spray, Place 2 sprays into the nose at bedtime., Disp: 16 g, Rfl: 2 HYZAAR 100-12.5 MG per tablet, TAKE 1 TABLET DAILY., Disp: 30 tablet, Rfl: 6;  meloxicam (MOBIC) 15 MG tablet, Take 15 mg by mouth daily.  , Disp: , Rfl: ;  Omega-3 Fatty Acids (TH FISH OIL EXTRA STRENGTH) 1200 MG CAPS, Take 1 capsule by mouth 2 (two) times daily., Disp: , Rfl: ;  pantoprazole (PROTONIX) 40 MG tablet, Take 1 tablet (40 mg total) by mouth daily., Disp: 30 tablet, Rfl: 11 SKELAXIN 800 MG tablet, TAKE 1 TABLET 3 TIMES A DAY AS NEEDED., Disp: 90 each, Rfl: 0;  traMADol (ULTRAM) 50 MG tablet, TAKE 1 OR 2 TABLETS EVERY EIGHT HOURS AS NEEDED FOR PAIN., Disp: 120 tablet, Rfl: 3;  verapamil (CALAN-SR) 240 MG CR tablet, TAKE 1 TABLET TWICE DAILY., Disp: 60 tablet, Rfl: 11;  DISCONTD: fluticasone (FLONASE) 50 MCG/ACT nasal spray, Place 2 sprays into the nose at bedtime. , Disp: , Rfl:  ALPRAZolam (XANAX XR) 1 MG 24 hr tablet, Take 1 mg by mouth daily.  , Disp: , Rfl: ;  B Complex-C (SUPER B COMPLEX/VITAMIN C PO), Take 1 tablet by mouth daily.  ,  Disp: , Rfl: ;  HYDROcodone-acetaminophen (VICODIN) 5-500 MG per tablet, Take 1 tablet by mouth every 8 (eight) hours as needed for pain., Disp: 20 tablet, Rfl: 0  EXAM:  Filed Vitals:   05/30/12 1351  BP: 130/78  Temp: 98.7 F (37.1 C)    There is no height on file to calculate BMI.  GENERAL: vitals reviewed and listed below, alert, oriented, appears well hydrated and in no acute distress  HEENT: atraumatic, conjucntiva clear, no obvious abnormalities on inspection of external nose and ears, ear wax in ear canals bilat, clear rhinorrhea, PND, mild pos oropharyngeal erythema  NECK: no masses on inspection, no LAD  LUNGS: clear to auscultation bilaterally, no wheezes, rales or rhonchi, good air movement  CV: HRRR, II/VII SEM, no peripheral edema  MS: moves all extremities without  noticeable abnormality  PSYCH: pleasant and cooperative, no obvious depression or anxiety  ASSESSMENT AND PLAN:  Discussed the following assessment and plan:  1. Allergic rhinitis  fluticasone (FLONASE) 50 MCG/ACT nasal spray  2. Viral upper respiratory infection    3. Hypertension    -Blood pressure normal today with no alarming symptoms  No orders of the defined types were placed in this encounter.    Patient Instructions  For your BLOOD PRESSURE: -check your blood pressure a few times per week at different times of the day and keep a written log with dates to bring to your next appointment -if blood pressure is higher the 170/90 please call your doctor -otherwise, continue your medications and follow up with your doctor as scheduled  For your SINUS ISSUES:  -plenty of rest and fluids  -nasal saline (use prepackaged nasal saline or bottled/distilled water if making your own)  -clean nose with nasal saline before using the nasal steroid   -can use tylenol  as directed for aches and sore throat - do not use tylenol at the same time you use your percocet  -follow up if fevers, worsening or not better in in 7 days      Return to clinic immediately if symptoms worsen or persist or new concerns.  Return if symptoms worsen or fail to improve.  Felicia Basque R.

## 2012-05-30 NOTE — Patient Instructions (Addendum)
For your BLOOD PRESSURE: -check your blood pressure a few times per week at different times of the day and keep a written log with dates to bring to your next appointment -if blood pressure is higher the 170/90 please call your doctor -otherwise, continue your medications and follow up with your doctor as scheduled  For your SINUS ISSUES:  -plenty of rest and fluids  -nasal saline (use prepackaged nasal saline or bottled/distilled water if making your own)  -clean nose with nasal saline before using the nasal steroid   -can use tylenol  as directed for aches and sore throat - do not use tylenol at the same time you use your percocet  -follow up if fevers, worsening or not better in in 7 days

## 2012-06-12 ENCOUNTER — Ambulatory Visit (INDEPENDENT_AMBULATORY_CARE_PROVIDER_SITE_OTHER): Payer: Medicare Other | Admitting: Family Medicine

## 2012-06-12 ENCOUNTER — Encounter: Payer: Self-pay | Admitting: Family Medicine

## 2012-06-12 VITALS — BP 132/84 | Temp 98.5°F | Wt 193.0 lb

## 2012-06-12 DIAGNOSIS — J329 Chronic sinusitis, unspecified: Secondary | ICD-10-CM

## 2012-06-12 MED ORDER — AMOXICILLIN-POT CLAVULANATE 875-125 MG PO TABS: 1.0000 | ORAL_TABLET | Freq: Two times a day (BID) | ORAL | Status: DC

## 2012-06-12 NOTE — Progress Notes (Signed)
Chief Complaint  Patient presents with  . f/u URI    head congestion and hoarseness     HPI:  Sinus issues: -seen 9/27 for chronic sinus issues -Rx flonase per pt request then  -felt better, but then a few days ago seemed to get worsening nasal congestion, cough, and hoarsness -A little R sinus pain -Denies: fevers, tooth pain, sinus pain, SOB  ROS: See pertinent positives and negatives per HPI.  Past Medical History  Diagnosis Date  . Allergy   . Depression   . GERD (gastroesophageal reflux disease)   . Hyperlipidemia   . Hypertension   . Arthritis     Family History  Problem Relation Age of Onset  . Heart disease Mother   . Heart disease Father   . Diabetes Paternal Grandfather     History   Social History  . Marital Status: Married    Spouse Name: N/A    Number of Children: N/A  . Years of Education: N/A   Social History Main Topics  . Smoking status: Never Smoker   . Smokeless tobacco: None  . Alcohol Use: No  . Drug Use: No  . Sexually Active: Yes   Other Topics Concern  . None   Social History Narrative  . None    Current outpatient prescriptions:acetaminophen (TYLENOL) 500 MG tablet, Take 500 mg by mouth every 6 (six) hours as needed.  , Disp: , Rfl: ;  ALPRAZolam (XANAX XR) 1 MG 24 hr tablet, Take 1 mg by mouth daily.  , Disp: , Rfl: ;  AMBIEN 10 MG tablet, TAKE 1 TABLET AT BEDTIME AS NEEDED., Disp: 30 each, Rfl: 5;  aspirin EC 81 MG tablet, Take 81 mg by mouth daily.  , Disp: , Rfl:  B Complex-C (SUPER B COMPLEX/VITAMIN C PO), Take 1 tablet by mouth daily.  , Disp: , Rfl: ;  calcium carbonate (OS-CAL - DOSED IN MG OF ELEMENTAL CALCIUM) 1250 MG tablet, Take 1 tablet by mouth daily.  , Disp: , Rfl: ;  cholecalciferol (VITAMIN D) 1000 UNITS tablet, Take 5,000 Units by mouth daily.  , Disp: , Rfl: ;  Choline Fenofibrate (TRILIPIX) 135 MG capsule, Take 1 capsule (135 mg total) by mouth daily., Disp: 30 capsule, Rfl: 11 DULoxetine (CYMBALTA) 60 MG capsule,  Take 60 mg by mouth daily. , Disp: , Rfl: ;  fluticasone (FLONASE) 50 MCG/ACT nasal spray, Place 2 sprays into the nose at bedtime., Disp: 16 g, Rfl: 2;  HYDROcodone-acetaminophen (VICODIN) 5-500 MG per tablet, Take 1 tablet by mouth every 8 (eight) hours as needed for pain., Disp: 20 tablet, Rfl: 0;  HYZAAR 100-12.5 MG per tablet, TAKE 1 TABLET DAILY., Disp: 30 tablet, Rfl: 6 meloxicam (MOBIC) 15 MG tablet, Take 15 mg by mouth daily.  , Disp: , Rfl: ;  Omega-3 Fatty Acids (TH FISH OIL EXTRA STRENGTH) 1200 MG CAPS, Take 1 capsule by mouth 2 (two) times daily., Disp: , Rfl: ;  pantoprazole (PROTONIX) 40 MG tablet, Take 1 tablet (40 mg total) by mouth daily., Disp: 30 tablet, Rfl: 11;  SKELAXIN 800 MG tablet, TAKE 1 TABLET 3 TIMES A DAY AS NEEDED., Disp: 90 each, Rfl: 0 traMADol (ULTRAM) 50 MG tablet, TAKE 1 OR 2 TABLETS EVERY EIGHT HOURS AS NEEDED FOR PAIN., Disp: 120 tablet, Rfl: 3;  verapamil (CALAN-SR) 240 MG CR tablet, TAKE 1 TABLET TWICE DAILY., Disp: 60 tablet, Rfl: 11;  amoxicillin-clavulanate (AUGMENTIN) 875-125 MG per tablet, Take 1 tablet by mouth 2 (two) times  daily., Disp: 20 tablet, Rfl: 0  EXAM:  Filed Vitals:   06/12/12 1540  BP: 132/84  Temp: 98.5 F (36.9 C)    There is no height on file to calculate BMI.  GENERAL: vitals reviewed and listed above, alert, oriented, appears well hydrated and in no acute distress  HEENT: atraumatic, conjunttiva clear, no obvious abnormalities on inspection of external nose and ears, ear canals normal, TMs normal, white nasal congestion on R with mild R maxillary sinus TTP, PND  NECK: no obvious masses on inspection, no LAD  LUNGS: clear to auscultation bilaterally, no wheezes, rales or rhonchi, good air movement  CV: HRRR, no peripheral edema  MS: moves all extremities without noticeable abnormality  PSYCH: pleasant and cooperative, no obvious depression or anxiety  ASSESSMENT AND PLAN:  Discussed the following assessment and plan:  1.  Sinusitis  amoxicillin-clavulanate (AUGMENTIN) 875-125 MG per tablet   -likely new viral infection, but given length of symptoms and R maxillary pain discuss that abx reasonable. Risks/benefits discussed. Strict return precautions. Continue nasal saline and flonase. -Patient advised to return to notify a doctor immediately if symptoms worsen or persist or new concerns arise.  There are no Patient Instructions on file for this visit.   Kriste Basque R.

## 2012-06-13 ENCOUNTER — Other Ambulatory Visit: Payer: Self-pay | Admitting: *Deleted

## 2012-06-13 MED ORDER — TRAMADOL HCL 50 MG PO TABS: 50.0000 mg | ORAL_TABLET | Freq: Four times a day (QID) | ORAL | Status: DC | PRN

## 2012-06-27 ENCOUNTER — Other Ambulatory Visit: Payer: Self-pay | Admitting: *Deleted

## 2012-06-27 MED ORDER — METAXALONE 800 MG PO TABS: 800.0000 mg | ORAL_TABLET | Freq: Three times a day (TID) | ORAL | Status: DC

## 2012-06-30 ENCOUNTER — Ambulatory Visit (INDEPENDENT_AMBULATORY_CARE_PROVIDER_SITE_OTHER): Payer: Medicare Other | Admitting: Internal Medicine

## 2012-06-30 ENCOUNTER — Encounter: Payer: Self-pay | Admitting: Internal Medicine

## 2012-06-30 VITALS — BP 130/84 | HR 80 | Temp 98.3°F | Resp 16 | Ht 63.0 in | Wt 190.0 lb

## 2012-06-30 DIAGNOSIS — I10 Essential (primary) hypertension: Secondary | ICD-10-CM

## 2012-06-30 DIAGNOSIS — F329 Major depressive disorder, single episode, unspecified: Secondary | ICD-10-CM

## 2012-06-30 DIAGNOSIS — M549 Dorsalgia, unspecified: Secondary | ICD-10-CM

## 2012-06-30 DIAGNOSIS — M25569 Pain in unspecified knee: Secondary | ICD-10-CM

## 2012-06-30 MED ORDER — ZOLPIDEM TARTRATE 10 MG PO TABS: 10.0000 mg | ORAL_TABLET | Freq: Every evening | ORAL | Status: DC | PRN

## 2012-06-30 MED ORDER — MELOXICAM 15 MG PO TABS: 15.0000 mg | ORAL_TABLET | Freq: Every day | ORAL | Status: DC

## 2012-06-30 NOTE — Progress Notes (Signed)
Subjective:    Patient ID: [name], female    DOB: [birth year], 71 y.o.   MRN: [id number]  HPI Is doing very well weight is stable and blood pressure are stable.  She is followed for a history of gastroesophageal reflux status post hiatal hernia repair and has done well.  Reviewed her medications she is up to date with appropriate screening she has a refill on her Ambien today  Back pain and knee pain primary complaint due to  OA ( degenerative) Mild depression   Review of Systems  Constitutional: Negative for activity change, appetite change and fatigue.  HENT: Negative for ear pain, congestion, neck pain, postnasal drip and sinus pressure.   Eyes: Negative for redness and visual disturbance.  Respiratory: Negative for cough, shortness of breath and wheezing.   Gastrointestinal: Negative for abdominal pain and abdominal distention.  Genitourinary: Negative for dysuria, frequency and menstrual problem.  Musculoskeletal: Negative for myalgias, joint swelling and arthralgias.  Skin: Negative for rash and wound.  Neurological: Negative for dizziness, weakness and headaches.  Hematological: Negative for adenopathy. Does not bruise/bleed easily.  Psychiatric/Behavioral: Negative for disturbed wake/sleep cycle and decreased concentration.   Past Medical History  Diagnosis Date  . Allergy   . Depression   . GERD (gastroesophageal reflux disease)   . Hyperlipidemia   . Hypertension   . Arthritis     History   Social History  . Marital Status: Married    Spouse Name: N/A    Number of Children: N/A  . Years of Education: N/A   Occupational History  . Not on file.   Social History Main Topics  . Smoking status: Never Smoker   . Smokeless tobacco: Not on file  . Alcohol Use: No  . Drug Use: No  . Sexually Active: Yes   Other Topics Concern  . Not on file   Social History Narrative  . No narrative on file    Past Surgical History  Procedure Date  . Lumbar  laminectomy   . Lumbar fusion   . Gastric surgeryobesity type and hh surgeon matt martin     Family History  Problem Relation Age of Onset  . Heart disease Mother   . Heart disease Father   . Diabetes Paternal Grandfather     Allergies  Allergen Reactions  . Prednisone   . Thiazide-Type Diuretics Other (See Comments)    "could not physically move"    Current Outpatient Prescriptions on File Prior to Visit  Medication Sig Dispense Refill  . acetaminophen (TYLENOL) 500 MG tablet Take 500 mg by mouth every 6 (six) hours as needed.        Marland Kitchen aspirin EC 81 MG tablet Take 81 mg by mouth daily.        . B Complex-C (SUPER B COMPLEX/VITAMIN C PO) Take 1 tablet by mouth daily.        . calcium carbonate (OS-CAL - DOSED IN MG OF ELEMENTAL CALCIUM) 1250 MG tablet Take 1 tablet by mouth daily.        . cholecalciferol (VITAMIN D) 1000 UNITS tablet Take 5,000 Units by mouth daily.        . Choline Fenofibrate (TRILIPIX) 135 MG capsule Take 1 capsule (135 mg total) by mouth daily.  30 capsule  11  . fluticasone (FLONASE) 50 MCG/ACT nasal spray Place 2 sprays into the nose at bedtime.  16 g  2  . HYZAAR 100-12.5 MG per tablet TAKE 1 TABLET DAILY.  30  tablet  6  . meloxicam (MOBIC) 15 MG tablet Take 15 mg by mouth daily.        . metaxalone (SKELAXIN) 800 MG tablet Take 1 tablet (800 mg total) by mouth 3 (three) times daily.  90 tablet  3  . Omega-3 Fatty Acids (TH FISH OIL EXTRA STRENGTH) 1200 MG CAPS Take 1 capsule by mouth 2 (two) times daily.      . pantoprazole (PROTONIX) 40 MG tablet Take 1 tablet (40 mg total) by mouth daily.  30 tablet  11  . traMADol (ULTRAM) 50 MG tablet Take 1 tablet (50 mg total) by mouth every 6 (six) hours as needed for pain.  120 tablet  3  . verapamil (CALAN-SR) 240 MG CR tablet TAKE 1 TABLET TWICE DAILY.  60 tablet  11  . DISCONTD: AMBIEN 10 MG tablet TAKE 1 TABLET AT BEDTIME AS NEEDED.  30 each  5  . DULoxetine (CYMBALTA) 60 MG capsule Take 60 mg by mouth daily.          BP 130/84  Pulse 80  Temp 98.3 F (36.8 C)  Resp 16  Ht 5\' 3"  (1.6 m)  Wt 190 lb (86.183 kg)  BMI 33.66 kg/m2       Objective:   Physical Exam  Nursing note and vitals reviewed. Constitutional: She is oriented to person, place, and time. She appears well-developed and well-nourished. No distress.  HENT:  Head: Normocephalic and atraumatic.  Right Ear: External ear normal.  Left Ear: External ear normal.  Nose: Nose normal.  Mouth/Throat: Oropharynx is clear and moist.  Eyes: Conjunctivae normal and EOM are normal. Pupils are equal, round, and reactive to light.  Neck: Normal range of motion. Neck supple. No JVD present. No tracheal deviation present. No thyromegaly present.  Cardiovascular: Normal rate, regular rhythm, normal heart sounds and intact distal pulses.   No murmur heard. Pulmonary/Chest: Effort normal and breath sounds normal. She has no wheezes. She exhibits no tenderness.  Abdominal: Soft. Bowel sounds are normal.  Musculoskeletal: Normal range of motion. She exhibits no edema and no tenderness.  Lymphadenopathy:    She has no cervical adenopathy.  Neurological: She is alert and oriented to person, place, and time. She has normal reflexes. No cranial nerve deficit.  Skin: Skin is warm and dry. She is not diaphoretic.  Psychiatric: She has a normal mood and affect. Her behavior is normal.          Assessment & Plan:  History of GERD and HH repair Has increased OA pain Stable GERD Discussion of resumption of mobic HTN stable Stable mood on cymbalta

## 2012-06-30 NOTE — Patient Instructions (Addendum)
The patient is instructed to continue all medications as prescribed. Schedule followup with check out clerk upon leaving the clinic  

## 2012-10-01 ENCOUNTER — Ambulatory Visit: Payer: Medicare Other | Admitting: Internal Medicine

## 2012-10-13 ENCOUNTER — Other Ambulatory Visit: Payer: Self-pay | Admitting: Internal Medicine

## 2012-10-14 ENCOUNTER — Other Ambulatory Visit: Payer: Self-pay | Admitting: *Deleted

## 2012-10-14 MED ORDER — DULOXETINE HCL 60 MG PO CPEP: 60.0000 mg | ORAL_CAPSULE | Freq: Every day | ORAL | Status: DC

## 2012-10-14 MED ORDER — CHOLINE FENOFIBRATE 135 MG PO CPDR: 135.0000 mg | DELAYED_RELEASE_CAPSULE | Freq: Every day | ORAL | Status: DC

## 2012-11-19 ENCOUNTER — Other Ambulatory Visit: Payer: Self-pay | Admitting: *Deleted

## 2012-11-19 MED ORDER — METAXALONE 800 MG PO TABS: 800.0000 mg | ORAL_TABLET | Freq: Three times a day (TID) | ORAL | Status: DC

## 2012-11-20 ENCOUNTER — Other Ambulatory Visit: Payer: Self-pay | Admitting: *Deleted

## 2012-11-20 MED ORDER — ZOLPIDEM TARTRATE 10 MG PO TABS: 10.0000 mg | ORAL_TABLET | Freq: Every evening | ORAL | Status: DC | PRN

## 2012-11-26 ENCOUNTER — Ambulatory Visit (INDEPENDENT_AMBULATORY_CARE_PROVIDER_SITE_OTHER): Payer: Medicare Other | Admitting: Internal Medicine

## 2012-11-26 ENCOUNTER — Encounter: Payer: Self-pay | Admitting: Internal Medicine

## 2012-11-26 VITALS — BP 128/66 | HR 68 | Temp 98.1°F | Wt 199.0 lb

## 2012-11-26 DIAGNOSIS — M1710 Unilateral primary osteoarthritis, unspecified knee: Secondary | ICD-10-CM

## 2012-11-26 DIAGNOSIS — L03211 Cellulitis of face: Secondary | ICD-10-CM

## 2012-11-26 DIAGNOSIS — I10 Essential (primary) hypertension: Secondary | ICD-10-CM

## 2012-11-26 DIAGNOSIS — E785 Hyperlipidemia, unspecified: Secondary | ICD-10-CM

## 2012-11-26 LAB — BASIC METABOLIC PANEL
Calcium: 10.2 mg/dL (ref 8.4–10.5)
Creatinine, Ser: 0.8 mg/dL (ref 0.4–1.2)
GFR: 77.02 mL/min (ref >60.00)
Sodium: 140 mEq/L (ref 135–145)

## 2012-11-26 LAB — LIPID PANEL
Cholesterol: 159 mg/dL (ref 0–200)
HDL: 47.2 mg/dL (ref >39.00)
Total CHOL/HDL Ratio: 3
Triglycerides: 119 mg/dL (ref 0.0–149.0)
VLDL: 23.8 mg/dL (ref 0.0–40.0)

## 2012-11-26 MED ORDER — MUPIROCIN 2 % EX OINT: TOPICAL_OINTMENT | Freq: Three times a day (TID) | CUTANEOUS | Status: DC

## 2012-11-26 NOTE — Progress Notes (Signed)
  Subjective:    Patient ID: [name], female    DOB: 1940/10/17, 72 y.o.   MRN: [id number]  HPI  Stable blood pressure in 50 mg of losartan. Stress is high Not  Exercising Mood stable   Review of Systems  Constitutional: Negative for activity change, appetite change and fatigue.  HENT: Negative for ear pain, congestion, neck pain, postnasal drip and sinus pressure.   Eyes: Negative for redness and visual disturbance.  Respiratory: Negative for cough, shortness of breath and wheezing.   Gastrointestinal: Negative for abdominal pain and abdominal distention.  Genitourinary: Negative for dysuria, frequency and menstrual problem.  Musculoskeletal: Negative for myalgias, joint swelling and arthralgias.  Skin: Negative for rash and wound.  Neurological: Negative for dizziness, weakness and headaches.  Hematological: Negative for adenopathy. Does not bruise/bleed easily.  Psychiatric/Behavioral: Negative for sleep disturbance and decreased concentration.       Objective:   Physical Exam  Constitutional: She is oriented to person, place, and time. She appears well-developed and well-nourished. No distress.  HENT:  Head: Normocephalic and atraumatic.  Right Ear: External ear normal.  Left Ear: External ear normal.  Nose: Nose normal.  Mouth/Throat: Oropharynx is clear and moist.  Eyes: Conjunctivae and EOM are normal. Pupils are equal, round, and reactive to light.  Neck: Normal range of motion. Neck supple. No JVD present. No tracheal deviation present. No thyromegaly present.  Cardiovascular: Normal rate and regular rhythm.   No murmur heard. Pulmonary/Chest: Effort normal and breath sounds normal. She has no wheezes. She exhibits no tenderness.  Abdominal: Soft. Bowel sounds are normal.  Musculoskeletal: Normal range of motion. She exhibits no edema and no tenderness.  Lymphadenopathy:    She has no cervical adenopathy.  Neurological: She is alert and oriented to  person, place, and time. She has normal reflexes. No cranial nerve deficit.  Skin: Skin is warm and dry. She is not diaphoretic.  Psychiatric: She has a normal mood and affect. Her behavior is normal.          Assessment & Plan:  HTN stable with calan and hyzaar Mood stable no reported panic Monitoring blood work due for HTN wearing cpap and allergies stable

## 2012-11-26 NOTE — Patient Instructions (Addendum)
You should stay on 1/2 losartan daily  Osteo Bi-Flex twice daily for the knee pain the knee pain is coming from the wearing of the cartilage in the knee that lets the bone hit the bone

## 2012-12-02 ENCOUNTER — Other Ambulatory Visit: Payer: Self-pay | Admitting: *Deleted

## 2012-12-02 MED ORDER — TIZANIDINE HCL 4 MG PO CAPS: 4.0000 mg | ORAL_CAPSULE | Freq: Three times a day (TID) | ORAL | Status: DC

## 2012-12-03 ENCOUNTER — Other Ambulatory Visit: Payer: Self-pay | Admitting: *Deleted

## 2012-12-09 ENCOUNTER — Encounter: Payer: Self-pay | Admitting: *Deleted

## 2012-12-31 ENCOUNTER — Other Ambulatory Visit: Payer: Self-pay

## 2012-12-31 ENCOUNTER — Ambulatory Visit
Admission: RE | Admit: 2012-12-31 | Discharge: 2012-12-31 | Disposition: A | Discharge status: Home / Self Care | Payer: Medicare Other | Source: Ambulatory Visit

## 2012-12-31 ENCOUNTER — Other Ambulatory Visit: Payer: Self-pay | Admitting: Internal Medicine

## 2012-12-31 DIAGNOSIS — Z1231 Encounter for screening mammogram for malignant neoplasm of breast: Secondary | ICD-10-CM

## 2013-02-09 ENCOUNTER — Other Ambulatory Visit: Payer: Self-pay | Admitting: Internal Medicine

## 2013-02-18 ENCOUNTER — Other Ambulatory Visit: Payer: Self-pay | Admitting: *Deleted

## 2013-02-18 MED ORDER — ZOLPIDEM TARTRATE 10 MG PO TABS: 10.0000 mg | ORAL_TABLET | Freq: Every evening | ORAL | Status: DC | PRN

## 2013-02-25 ENCOUNTER — Other Ambulatory Visit: Payer: Self-pay | Admitting: Internal Medicine

## 2013-04-03 ENCOUNTER — Ambulatory Visit (INDEPENDENT_AMBULATORY_CARE_PROVIDER_SITE_OTHER): Payer: Medicare Other | Admitting: Internal Medicine

## 2013-04-03 ENCOUNTER — Encounter: Payer: Self-pay | Admitting: Internal Medicine

## 2013-04-03 VITALS — BP 140/80 | HR 72 | Temp 98.1°F | Resp 16 | Ht 63.0 in | Wt 210.0 lb

## 2013-04-03 DIAGNOSIS — M5137 Other intervertebral disc degeneration, lumbosacral region: Secondary | ICD-10-CM

## 2013-04-03 DIAGNOSIS — E785 Hyperlipidemia, unspecified: Secondary | ICD-10-CM

## 2013-04-03 DIAGNOSIS — I10 Essential (primary) hypertension: Secondary | ICD-10-CM

## 2013-04-03 NOTE — Patient Instructions (Addendum)
The patient is instructed to continue all medications as prescribed. Schedule followup with check out clerk upon leaving the clinic Back Exercises Back exercises help treat and prevent back injuries. The goal of back exercises is to increase the strength of your abdominal and back muscles and the flexibility of your back. These exercises should be started when you no longer have back pain. Back exercises include:  Pelvic Tilt. Lie on your back with your knees bent. Tilt your pelvis until the lower part of your back is against the floor. Hold this position 5 to 10 sec and repeat 5 to 10 times.  Knee to Chest. Pull first 1 knee up against your chest and hold for 20 to 30 seconds, repeat this with the other knee, and then both knees. This may be done with the other leg straight or bent, whichever feels better.  Sit-Ups or Curl-Ups. Bend your knees 90 degrees. Start with tilting your pelvis, and do a partial, slow sit-up, lifting your trunk only 30 to 45 degrees off the floor. Take at least 2 to 3 seconds for each sit-up. Do not do sit-ups with your knees out straight. If partial sit-ups are difficult, simply do the above but with only tightening your abdominal muscles and holding it as directed.  Hip-Lift. Lie on your back with your knees flexed 90 degrees. Push down with your feet and shoulders as you raise your hips a couple inches off the floor; hold for 10 seconds, repeat 5 to 10 times.  Back arches. Lie on your stomach, propping yourself up on bent elbows. Slowly press on your hands, causing an arch in your low back. Repeat 3 to 5 times. Any initial stiffness and discomfort should lessen with repetition over time.  Shoulder-Lifts. Lie face down with arms beside your body. Keep hips and torso pressed to floor as you slowly lift your head and shoulders off the floor. Do not overdo your exercises, especially in the beginning. Exercises may cause you some mild back discomfort which lasts for a few  minutes; however, if the pain is more severe, or lasts for more than 15 minutes, do not continue exercises until you see your caregiver. Improvement with exercise therapy for back problems is slow.  See your caregivers for assistance with developing a proper back exercise program. Document Released: 09/27/2004 Document Revised: 11/12/2011 Document Reviewed: 06/21/2011 [facility] Patient Information 2014 [place], [place].

## 2013-04-03 NOTE — Progress Notes (Signed)
Subjective:    Patient ID: [name], female    DOB: 27-Sep-1940, 72 y.o.   MRN: [id number]  HPI Back pain and knee pain After work she lays down and the pain improves Husband with chronic illness has increased her stress    Review of Systems  Constitutional: Negative for activity change, appetite change and fatigue.  HENT: Negative for ear pain, congestion, neck pain, postnasal drip and sinus pressure.   Eyes: Negative for redness and visual disturbance.  Respiratory: Negative for cough, shortness of breath and wheezing.   Gastrointestinal: Negative for abdominal pain and abdominal distention.  Genitourinary: Negative for dysuria, frequency and menstrual problem.  Musculoskeletal: Positive for back pain, joint swelling and gait problem. Negative for myalgias and arthralgias.  Skin: Negative for rash and wound.  Neurological: Negative for dizziness, weakness and headaches.  Hematological: Negative for adenopathy. Does not bruise/bleed easily.  Psychiatric/Behavioral: Negative for sleep disturbance and decreased concentration.   Past Medical History  Diagnosis Date  . Allergy   . Depression   . GERD (gastroesophageal reflux disease)   . Hyperlipidemia   . Hypertension   . Arthritis     History   Social History  . Marital Status: Married    Spouse Name: N/A    Number of Children: N/A  . Years of Education: N/A   Occupational History  . Not on file.   Social History Main Topics  . Smoking status: Never Smoker   . Smokeless tobacco: Not on file  . Alcohol Use: No  . Drug Use: No  . Sexually Active: Yes   Other Topics Concern  . Not on file   Social History Narrative  . No narrative on file    Past Surgical History  Procedure Laterality Date  . Lumbar laminectomy    . Lumbar fusion    . Gastric surgeryobesity type and hh surgeon matt martin      Family History  Problem Relation Age of Onset  . Heart disease Mother   . Heart disease Father   .  Diabetes Paternal Grandfather     Allergies  Allergen Reactions  . Prednisone   . Thiazide-Type Diuretics Other (See Comments)    "could not physically move"    Current Outpatient Prescriptions on File Prior to Visit  Medication Sig Dispense Refill  . acetaminophen (TYLENOL) 500 MG tablet Take 500 mg by mouth every 6 (six) hours as needed.        Marland Kitchen aspirin EC 81 MG tablet Take 81 mg by mouth daily.        . B Complex-C (SUPER B COMPLEX/VITAMIN C PO) Take 1 tablet by mouth daily.        . calcium carbonate (OS-CAL - DOSED IN MG OF ELEMENTAL CALCIUM) 1250 MG tablet Take 1 tablet by mouth daily.        . cholecalciferol (VITAMIN D) 1000 UNITS tablet Take 5,000 Units by mouth daily.        . Choline Fenofibrate (TRILIPIX) 135 MG capsule Take 1 capsule (135 mg total) by mouth daily.  30 capsule  11  . DULoxetine (CYMBALTA) 60 MG capsule Take 1 capsule (60 mg total) by mouth daily.  30 capsule  11  . fluticasone (FLONASE) 50 MCG/ACT nasal spray Place 2 sprays into the nose at bedtime.  16 g  2  . HYZAAR 100-12.5 MG per tablet TAKE 1 TABLET DAILY.  30 tablet  6  . meloxicam (MOBIC) 15 MG tablet TAKE 1 TABLET  ONCE DAILY.  30 tablet  6  . mupirocin ointment (BACTROBAN) 2 % Apply topically 3 (three) times daily.  22 g  0  . Omega-3 Fatty Acids (TH FISH OIL EXTRA STRENGTH) 1200 MG CAPS Take 1 capsule by mouth 2 (two) times daily.      Marland Kitchen tiZANidine (ZANAFLEX) 4 MG tablet TAKE 1 TABLET 3 TIMES A DAY.  90 tablet  6  . traMADol (ULTRAM) 50 MG tablet TAKE 1 TABLET EVERY SIX HOURS AS NEEDED FOR PAIN.  120 tablet  3  . verapamil (CALAN-SR) 240 MG CR tablet TAKE 1 TABLET TWICE DAILY.  60 tablet  11  . zolpidem (AMBIEN) 10 MG tablet Take 1 tablet (10 mg total) by mouth at bedtime as needed for sleep.  30 tablet  3  . pantoprazole (PROTONIX) 40 MG tablet Take 1 tablet (40 mg total) by mouth daily.  30 tablet  11   No current facility-administered medications on file prior to visit.    BP 140/80  Pulse  72  Temp(Src) 98.1 F (36.7 C)  Resp 16  Ht 5\' 3"  (1.6 m)  Wt 210 lb (95.255 kg)  BMI 37.21 kg/m2       Objective:   Physical Exam  Constitutional: She is oriented to person, place, and time. She appears well-developed and well-nourished. No distress.  HENT:  Head: Normocephalic and atraumatic.  Right Ear: External ear normal.  Left Ear: External ear normal.  Nose: Nose normal.  Mouth/Throat: Oropharynx is clear and moist.  Eyes: Conjunctivae and EOM are normal. Pupils are equal, round, and reactive to light.  Neck: Normal range of motion. Neck supple. No JVD present. No tracheal deviation present. No thyromegaly present.  Cardiovascular: Normal rate, regular rhythm and intact distal pulses.   Murmur heard. Pulmonary/Chest: Effort normal and breath sounds normal. She has no wheezes. She exhibits no tenderness.  Abdominal: Soft. Bowel sounds are normal.  Musculoskeletal: She exhibits edema and tenderness.  Lymphadenopathy:    She has no cervical adenopathy.  Neurological: She is alert and oriented to person, place, and time. She has normal reflexes. No cranial nerve deficit.  Skin: Skin is warm and dry. She is not diaphoretic.  Psychiatric: She has a normal mood and affect. Her behavior is normal.          Assessment & Plan:  Chronic back pain using tramadol and tylenol Needs to do stretchong

## 2013-04-08 ENCOUNTER — Other Ambulatory Visit: Payer: Self-pay

## 2013-05-11 ENCOUNTER — Other Ambulatory Visit: Payer: Self-pay | Admitting: *Deleted

## 2013-05-11 MED ORDER — ZOLPIDEM TARTRATE 10 MG PO TABS: 10.0000 mg | ORAL_TABLET | Freq: Every evening | ORAL | Status: DC | PRN

## 2013-06-05 ENCOUNTER — Ambulatory Visit (INDEPENDENT_AMBULATORY_CARE_PROVIDER_SITE_OTHER): Payer: Medicare Other

## 2013-06-05 DIAGNOSIS — Z23 Encounter for immunization: Secondary | ICD-10-CM

## 2013-06-10 ENCOUNTER — Telehealth: Payer: Self-pay | Admitting: Internal Medicine

## 2013-06-10 NOTE — Telephone Encounter (Signed)
Talked with husband and he states this is something that has been coming on for a while and dr Lovell Sheehan is aware- she gets dizzy,forgettful and was fired from her job last week because she forgot something- asks who you                                            Would suggest a referral to?

## 2013-06-10 NOTE — Telephone Encounter (Signed)
Pt husband called and stated that his wife is having dizzy spells, and has poor balance. He refused to schedule her to see anyone else. Please assist.

## 2013-06-12 ENCOUNTER — Encounter: Payer: Self-pay | Admitting: Internal Medicine

## 2013-06-12 ENCOUNTER — Ambulatory Visit (INDEPENDENT_AMBULATORY_CARE_PROVIDER_SITE_OTHER): Payer: Medicare Other | Admitting: Internal Medicine

## 2013-06-12 VITALS — BP 130/84 | Temp 98.4°F | Wt 211.0 lb

## 2013-06-12 DIAGNOSIS — I10 Essential (primary) hypertension: Secondary | ICD-10-CM

## 2013-06-12 DIAGNOSIS — R4182 Altered mental status, unspecified: Secondary | ICD-10-CM

## 2013-06-12 DIAGNOSIS — Z23 Encounter for immunization: Secondary | ICD-10-CM

## 2013-06-12 DIAGNOSIS — Z9181 History of falling: Secondary | ICD-10-CM

## 2013-06-12 DIAGNOSIS — M51379 Other intervertebral disc degeneration, lumbosacral region without mention of lumbar back pain or lower extremity pain: Secondary | ICD-10-CM

## 2013-06-12 DIAGNOSIS — M5137 Other intervertebral disc degeneration, lumbosacral region: Secondary | ICD-10-CM

## 2013-06-12 NOTE — Patient Instructions (Signed)
Return in one month for followup  Discontinue Ambien and tramadol  Call or return to clinic prn if these symptoms worsen or fail to improve as anticipated.

## 2013-06-12 NOTE — Progress Notes (Signed)
Subjective:    Patient ID: [name], female    DOB: 1941/05/09, 72 y.o.   MRN: [id number]  HPI  72 year old patient who is seen today after several recent falls. She states that she is following a loose 50 pounds over the past 2 years. She complains of increasing forgetfulness. She has been on chronic Ambien at bedtime and also uses tramadol chronically. Her husband states that she gets up frequently through the night and is quite confused but able to carry on a conversation. She will lose her balance and fall. The patient states that she has lost her job due to to increasing forgetfulness.  MMSE  performed today with a score of 28/30 (unable to name the date and able to recall only 2 of 3 objects)  Past Medical History  Diagnosis Date  . Allergy   . Depression   . GERD (gastroesophageal reflux disease)   . Hyperlipidemia   . Hypertension   . Arthritis     History   Social History  . Marital Status: Married    Spouse Name: N/A    Number of Children: N/A  . Years of Education: N/A   Occupational History  . Not on file.   Social History Main Topics  . Smoking status: Never Smoker   . Smokeless tobacco: Not on file  . Alcohol Use: No  . Drug Use: No  . Sexual Activity: Yes   Other Topics Concern  . Not on file   Social History Narrative  . No narrative on file    Past Surgical History  Procedure Laterality Date  . Lumbar laminectomy    . Lumbar fusion    . Gastric surgeryobesity type and hh surgeon matt martin      Family History  Problem Relation Age of Onset  . Heart disease Mother   . Heart disease Father   . Diabetes Paternal Grandfather     Allergies  Allergen Reactions  . Prednisone   . Thiazide-Type Diuretics Other (See Comments)    "could not physically move"    Current Outpatient Prescriptions on File Prior to Visit  Medication Sig Dispense Refill  . acetaminophen (TYLENOL) 500 MG tablet Take 500 mg by mouth every 6 (six) hours as  needed.        Marland Kitchen aspirin EC 81 MG tablet Take 81 mg by mouth daily.        . B Complex-C (SUPER B COMPLEX/VITAMIN C PO) Take 1 tablet by mouth daily.        . calcium carbonate (OS-CAL - DOSED IN MG OF ELEMENTAL CALCIUM) 1250 MG tablet Take 1 tablet by mouth daily.        . cholecalciferol (VITAMIN D) 1000 UNITS tablet Take 5,000 Units by mouth daily.        . Choline Fenofibrate (TRILIPIX) 135 MG capsule Take 1 capsule (135 mg total) by mouth daily.  30 capsule  11  . DULoxetine (CYMBALTA) 60 MG capsule Take 1 capsule (60 mg total) by mouth daily.  30 capsule  11  . fluticasone (FLONASE) 50 MCG/ACT nasal spray Place 2 sprays into the nose at bedtime.  16 g  2  . HYZAAR 100-12.5 MG per tablet TAKE 1 TABLET DAILY.  30 tablet  6  . meloxicam (MOBIC) 15 MG tablet TAKE 1 TABLET ONCE DAILY.  30 tablet  6  . mupirocin ointment (BACTROBAN) 2 % Apply topically 3 (three) times daily.  22 g  0  . Omega-3 Fatty  Acids (TH FISH OIL EXTRA STRENGTH) 1200 MG CAPS Take 1 capsule by mouth 2 (two) times daily.      Marland Kitchen tiZANidine (ZANAFLEX) 4 MG tablet TAKE 1 TABLET 3 TIMES A DAY.  90 tablet  6  . traMADol (ULTRAM) 50 MG tablet TAKE 1 TABLET EVERY SIX HOURS AS NEEDED FOR PAIN.  120 tablet  3  . verapamil (CALAN-SR) 240 MG CR tablet TAKE 1 TABLET TWICE DAILY.  60 tablet  11  . zolpidem (AMBIEN) 10 MG tablet Take 1 tablet (10 mg total) by mouth at bedtime as needed for sleep.  30 tablet  3  . pantoprazole (PROTONIX) 40 MG tablet Take 1 tablet (40 mg total) by mouth daily.  30 tablet  11   No current facility-administered medications on file prior to visit.    BP 130/84  Temp(Src) 98.4 F (36.9 C) (Oral)  Wt 211 lb (95.709 kg)  BMI 37.39 kg/m2       Review of Systems  Constitutional: Negative.   HENT: Negative for congestion, dental problem, hearing loss, rhinorrhea, sinus pressure, sore throat and tinnitus.   Eyes: Negative for pain, discharge and visual disturbance.  Respiratory: Negative for cough and  shortness of breath.   Cardiovascular: Negative for chest pain, palpitations and leg swelling.  Gastrointestinal: Negative for nausea, vomiting, abdominal pain, diarrhea, constipation, blood in stool and abdominal distention.  Genitourinary: Negative for dysuria, urgency, frequency, hematuria, flank pain, vaginal bleeding, vaginal discharge, difficulty urinating, vaginal pain and pelvic pain.  Musculoskeletal: Positive for arthralgias, back pain, gait problem and myalgias. Negative for joint swelling.  Skin: Negative for rash.  Neurological: Negative for dizziness, syncope, speech difficulty, weakness, numbness and headaches.  Hematological: Negative for adenopathy.  Psychiatric/Behavioral: Positive for confusion, sleep disturbance and decreased concentration. Negative for behavioral problems, dysphoric mood and agitation. The patient is not nervous/anxious.        Objective:   Physical Exam  Constitutional: She is oriented to person, place, and time. She appears well-developed and well-nourished.  HENT:  Head: Normocephalic.  Right Ear: External ear normal.  Left Ear: External ear normal.  Mouth/Throat: Oropharynx is clear and moist.  Eyes: Conjunctivae and EOM are normal. Pupils are equal, round, and reactive to light.  Neck: Normal range of motion. Neck supple. No thyromegaly present.  Cardiovascular: Normal rate, regular rhythm, normal heart sounds and intact distal pulses.   Pulmonary/Chest: Effort normal and breath sounds normal.  Abdominal: Soft. Bowel sounds are normal. She exhibits no mass. There is no tenderness.  Musculoskeletal: Normal range of motion.  Lymphadenopathy:    She has no cervical adenopathy.  Neurological: She is alert and oriented to person, place, and time.  Unsteady gait but not grossly ataxic Finger to nose testing normal No drift  MMSE 28/30  Skin: Skin is warm and dry. No rash noted.  Psychiatric: She has a normal mood and affect. Her behavior is  normal.          Assessment & Plan:   Episodic confusion with frequent falls. Symptoms are worse at night following Ambien use. This medication will be discontinued. Depending on her clinical response will consider EEG and brain MRI Osteoarthritis low back pain. Tylenol will be discontinued. The patient will be treated with Tylenol and ibuprofen only Hypertension stable  Recheck 4 weeks off Ambien and tramadol

## 2013-06-15 ENCOUNTER — Other Ambulatory Visit: Payer: Self-pay | Admitting: *Deleted

## 2013-06-15 ENCOUNTER — Other Ambulatory Visit: Payer: Self-pay | Admitting: Internal Medicine

## 2013-06-15 DIAGNOSIS — R42 Dizziness and giddiness: Secondary | ICD-10-CM

## 2013-06-15 DIAGNOSIS — R413 Other amnesia: Secondary | ICD-10-CM

## 2013-06-15 NOTE — Telephone Encounter (Signed)
neurologsy appoint sent to [facility] Left message on machine For pt

## 2013-06-15 NOTE — Telephone Encounter (Signed)
Consider a neurology referral?

## 2013-07-13 ENCOUNTER — Ambulatory Visit (INDEPENDENT_AMBULATORY_CARE_PROVIDER_SITE_OTHER): Payer: Medicare Other | Admitting: Internal Medicine

## 2013-07-13 ENCOUNTER — Encounter: Payer: Self-pay | Admitting: Internal Medicine

## 2013-07-13 VITALS — BP 164/90 | HR 78 | Temp 98.4°F | Resp 20 | Wt 211.0 lb

## 2013-07-13 DIAGNOSIS — Z9181 History of falling: Secondary | ICD-10-CM

## 2013-07-13 DIAGNOSIS — M5137 Other intervertebral disc degeneration, lumbosacral region: Secondary | ICD-10-CM

## 2013-07-13 DIAGNOSIS — I10 Essential (primary) hypertension: Secondary | ICD-10-CM

## 2013-07-13 DIAGNOSIS — M51379 Other intervertebral disc degeneration, lumbosacral region without mention of lumbar back pain or lower extremity pain: Secondary | ICD-10-CM

## 2013-07-13 DIAGNOSIS — M1990 Unspecified osteoarthritis, unspecified site: Secondary | ICD-10-CM

## 2013-07-13 NOTE — Patient Instructions (Signed)
Call or return to clinic prn if these symptoms worsen or fail to improve as anticipated.  Followup with Dr. Lovell Sheehan in 4 weeks  Please check your blood pressure on a regular basis.  If it is consistently greater than 150/90, please make an office appointment.

## 2013-07-13 NOTE — Progress Notes (Signed)
  Subjective:    Patient ID: [name], female    DOB: 1941/01/31, 72 y.o.   MRN: [id number]  HPI  BP Readings from Last 3 Encounters:  07/13/13 164/90  06/12/13 130/84  04/03/13 140/80    Review of Systems     Objective:   Physical Exam        Assessment & Plan:

## 2013-07-13 NOTE — Progress Notes (Signed)
Pre-visit discussion using our clinic review tool. No additional management support is needed unless otherwise documented below in the visit note.  

## 2013-07-13 NOTE — Progress Notes (Signed)
Subjective:    Patient ID: [name], female    DOB: 04-13-1941, 72 y.o.   MRN: [id number]  HPI Pre-visit discussion using our clinic review tool. No additional management support is needed unless otherwise documented below in the visit note.  72 year old patient who is seen today in followup. She was seen one month ago after several recent falls and gave a history of 50 falls over the prior 2 years by her estimate.  Ambien and Tylenol were discontinued and she has done much better. She states that she has had only one fall when she tripped over a power cord.  Her husband still describes some unsteady gait at home.  They're still concerns about being forgetful.  MMSE last month was fairly normal with 28/30 score.  One question that she missed was the day's date which she did without difficulty today. She has significant arthritis and a history of 2 laminectomies. She complains of right knee pain  She has hypertension which has tended up over the past couple of days  Her husband describes episodes of possible sleepwalking where she gets out of bed and roams the house but speech is unintelligible  Past Medical History  Diagnosis Date  . Allergy   . Depression   . GERD (gastroesophageal reflux disease)   . Hyperlipidemia   . Hypertension   . Arthritis     History   Social History  . Marital Status: Married    Spouse Name: N/A    Number of Children: N/A  . Years of Education: N/A   Occupational History  . Not on file.   Social History Main Topics  . Smoking status: Never Smoker   . Smokeless tobacco: Not on file  . Alcohol Use: No  . Drug Use: No  . Sexual Activity: Yes   Other Topics Concern  . Not on file   Social History Narrative  . No narrative on file    Past Surgical History  Procedure Laterality Date  . Lumbar laminectomy    . Lumbar fusion    . Gastric surgeryobesity type and hh surgeon matt martin      Family History  Problem Relation Age of Onset   . Heart disease Mother   . Heart disease Father   . Diabetes Paternal Grandfather     Allergies  Allergen Reactions  . Prednisone   . Thiazide-Type Diuretics Other (See Comments)    "could not physically move"    Current Outpatient Prescriptions on File Prior to Visit  Medication Sig Dispense Refill  . acetaminophen (TYLENOL) 500 MG tablet Take 500 mg by mouth every 6 (six) hours as needed.        Marland Kitchen aspirin EC 81 MG tablet Take 81 mg by mouth daily.        . B Complex-C (SUPER B COMPLEX/VITAMIN C PO) Take 1 tablet by mouth daily.        . calcium carbonate (OS-CAL - DOSED IN MG OF ELEMENTAL CALCIUM) 1250 MG tablet Take 1 tablet by mouth daily.        . cholecalciferol (VITAMIN D) 1000 UNITS tablet Take 5,000 Units by mouth daily.        . Choline Fenofibrate (TRILIPIX) 135 MG capsule Take 1 capsule (135 mg total) by mouth daily.  30 capsule  11  . DULoxetine (CYMBALTA) 60 MG capsule Take 1 capsule (60 mg total) by mouth daily.  30 capsule  11  . fluticasone (FLONASE) 50 MCG/ACT nasal  spray Place 2 sprays into the nose at bedtime.  16 g  2  . losartan-hydrochlorothiazide (HYZAAR) 100-12.5 MG per tablet TAKE 1 TABLET DAILY.  30 tablet  11  . meloxicam (MOBIC) 15 MG tablet TAKE 1 TABLET ONCE DAILY.  30 tablet  6  . mupirocin ointment (BACTROBAN) 2 % Apply topically 3 (three) times daily.  22 g  0  . Omega-3 Fatty Acids (TH FISH OIL EXTRA STRENGTH) 1200 MG CAPS Take 1 capsule by mouth 2 (two) times daily.      Marland Kitchen tiZANidine (ZANAFLEX) 4 MG tablet TAKE 1 TABLET 3 TIMES A DAY.  90 tablet  6  . verapamil (CALAN-SR) 240 MG CR tablet TAKE 1 TABLET TWICE DAILY.  60 tablet  11   No current facility-administered medications on file prior to visit.    BP 164/90  Pulse 78  Temp(Src) 98.4 F (36.9 C) (Oral)  Resp 20  Wt 211 lb (95.709 kg)  SpO2 97%       Review of Systems  Constitutional: Negative.   HENT: Negative for congestion, dental problem, hearing loss, rhinorrhea, sinus  pressure, sore throat and tinnitus.   Eyes: Negative for pain, discharge and visual disturbance.  Respiratory: Negative for cough and shortness of breath.   Cardiovascular: Negative for chest pain, palpitations and leg swelling.  Gastrointestinal: Negative for nausea, vomiting, abdominal pain, diarrhea, constipation, blood in stool and abdominal distention.  Genitourinary: Negative for dysuria, urgency, frequency, hematuria, flank pain, vaginal bleeding, vaginal discharge, difficulty urinating, vaginal pain and pelvic pain.  Musculoskeletal: Positive for gait problem. Negative for arthralgias and joint swelling.  Skin: Negative for rash.  Neurological: Negative for dizziness, syncope, speech difficulty, weakness, numbness and headaches.  Hematological: Negative for adenopathy.  Psychiatric/Behavioral: Positive for behavioral problems and decreased concentration. Negative for dysphoric mood and agitation. The patient is not nervous/anxious.        Objective:   Physical Exam  Constitutional: She appears well-developed and well-nourished. No distress.  Blood pressure 160/90  Neurological:  Gait slightly unsteady but not ataxic Unable to do a tandem walk  Nose testing was normal No drift          Assessment & Plan:   Gait abnormality/frequent falls-dramatically improved with discontinuation of Ambien Hypertension. Closer home blood pressure monitor and encouraged Forgetfulness  Options discussed including neurology referral. The patient's wish is to further observe at this point. She is scheduled to see her PCP in 4 weeks

## 2013-07-29 ENCOUNTER — Other Ambulatory Visit: Payer: Self-pay | Admitting: Internal Medicine

## 2013-07-29 ENCOUNTER — Other Ambulatory Visit: Payer: Self-pay | Admitting: *Deleted

## 2013-07-29 MED ORDER — ALPRAZOLAM 0.25 MG PO TABS: 0.2500 mg | ORAL_TABLET | Freq: Two times a day (BID) | ORAL | Status: DC | PRN

## 2013-08-11 ENCOUNTER — Encounter: Payer: Self-pay | Admitting: *Deleted

## 2013-08-12 ENCOUNTER — Ambulatory Visit (INDEPENDENT_AMBULATORY_CARE_PROVIDER_SITE_OTHER): Payer: Medicare Other | Admitting: Internal Medicine

## 2013-08-12 ENCOUNTER — Encounter: Payer: Self-pay | Admitting: Internal Medicine

## 2013-08-12 VITALS — BP 120/78 | HR 72 | Temp 98.2°F | Resp 16 | Ht 63.0 in | Wt 206.0 lb

## 2013-08-12 DIAGNOSIS — M1710 Unilateral primary osteoarthritis, unspecified knee: Secondary | ICD-10-CM

## 2013-08-12 DIAGNOSIS — I10 Essential (primary) hypertension: Secondary | ICD-10-CM

## 2013-08-12 DIAGNOSIS — Z23 Encounter for immunization: Secondary | ICD-10-CM

## 2013-08-12 NOTE — Patient Instructions (Signed)
The patient is instructed to continue all medications as prescribed. Schedule followup with check out clerk upon leaving the clinic  

## 2013-08-12 NOTE — Progress Notes (Signed)
Pre visit review using our clinic review tool, if applicable. No additional management support is needed unless otherwise documented below in the visit note. 

## 2013-08-12 NOTE — Progress Notes (Signed)
   Subjective:    Patient ID: [name], female    DOB: 06-27-1941, 72 y.o.   MRN: [id number]  Hypertension    Has retired [employer] issues TD today   Review of Systems  HENT: Negative.   Eyes: Negative.   Respiratory: Negative.   Cardiovascular: Negative.   Gastrointestinal: Negative.   Endocrine: Negative.   Genitourinary: Negative.   Musculoskeletal: Positive for back pain, joint swelling and myalgias.  Psychiatric/Behavioral: Positive for dysphoric mood and decreased concentration.   Due to ambiem    Objective:   Physical Exam  Nursing note and vitals reviewed. Constitutional: She is oriented to person, place, and time. She appears well-developed and well-nourished.  HENT:  Head: Normocephalic.  Pulmonary/Chest: Effort normal.  Musculoskeletal: Normal range of motion. She exhibits edema and tenderness.  Neurological: She is alert and oriented to person, place, and time.  Skin: Skin is dry.  Psychiatric: Her behavior is normal.          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

## 2013-11-06 ENCOUNTER — Other Ambulatory Visit: Payer: Self-pay | Admitting: Internal Medicine

## 2014-01-11 ENCOUNTER — Other Ambulatory Visit: Payer: Self-pay | Admitting: Internal Medicine

## 2014-01-28 ENCOUNTER — Telehealth: Payer: Self-pay | Admitting: Internal Medicine

## 2014-01-28 MED ORDER — ALPRAZOLAM 0.25 MG PO TABS
0.2500 mg | ORAL_TABLET | Freq: Two times a day (BID) | ORAL | Status: DC | PRN
Start: 2014-01-28 — End: 2014-06-10

## 2014-01-28 NOTE — Telephone Encounter (Signed)
Ok per Dr Jenkins, rx called in 

## 2014-01-28 NOTE — Telephone Encounter (Signed)
[facility] [place], [address]. Is requesting re-fill on ALPRAZolam (XANAX) 0.25 MG tablet

## 2014-02-05 ENCOUNTER — Other Ambulatory Visit (INDEPENDENT_AMBULATORY_CARE_PROVIDER_SITE_OTHER): Payer: Medicare Other

## 2014-02-05 DIAGNOSIS — E785 Hyperlipidemia, unspecified: Secondary | ICD-10-CM

## 2014-02-05 DIAGNOSIS — Z0000 Encounter for general adult medical examination without abnormal findings: Secondary | ICD-10-CM

## 2014-02-05 DIAGNOSIS — I10 Essential (primary) hypertension: Secondary | ICD-10-CM

## 2014-02-05 DIAGNOSIS — M1990 Unspecified osteoarthritis, unspecified site: Secondary | ICD-10-CM

## 2014-02-05 LAB — BASIC METABOLIC PANEL
BUN: 23 mg/dL (ref 6–23)
CHLORIDE: 104 meq/L (ref 96–112)
CO2: 30 mEq/L (ref 19–32)
CREATININE: 0.7 mg/dL (ref 0.4–1.2)
Calcium: 9.4 mg/dL (ref 8.4–10.5)
GFR: 82.86 mL/min (ref >60.00)
Glucose, Bld: 108 mg/dL — ABNORMAL HIGH (ref 70–99)
Potassium: 4.2 mEq/L (ref 3.5–5.1)
Sodium: 139 mEq/L (ref 135–145)

## 2014-02-05 LAB — CBC WITH DIFFERENTIAL/PLATELET
BASOS PCT: 0.6 % (ref 0.0–3.0)
Basophils Absolute: 0 10*3/uL (ref 0.0–0.1)
EOS PCT: 2.3 % (ref 0.0–5.0)
Eosinophils Absolute: 0.1 10*3/uL (ref 0.0–0.7)
HCT: 38.8 % (ref 36.0–46.0)
Hemoglobin: 12.9 g/dL (ref 12.0–15.0)
LYMPHS PCT: 38.7 % (ref 12.0–46.0)
Lymphs Abs: 2.3 10*3/uL (ref 0.7–4.0)
MCHC: 33.3 g/dL (ref 30.0–36.0)
MCV: 98.1 fl (ref 78.0–100.0)
MONO ABS: 0.4 10*3/uL (ref 0.1–1.0)
Monocytes Relative: 6.4 % (ref 3.0–12.0)
Neutro Abs: 3 10*3/uL (ref 1.4–7.7)
Neutrophils Relative %: 52 % (ref 43.0–77.0)
Platelets: 340 10*3/uL (ref 150.0–400.0)
RBC: 3.96 Mil/uL (ref 3.87–5.11)
RDW: 12.6 % (ref 11.5–15.5)
WBC: 5.8 10*3/uL (ref 4.0–10.5)

## 2014-02-05 LAB — LIPID PANEL
CHOL/HDL RATIO: 3
Cholesterol: 158 mg/dL (ref 0–200)
HDL: 55.4 mg/dL (ref >39.00)
LDL Cholesterol: 94 mg/dL (ref 0–99)
NonHDL: 102.6
TRIGLYCERIDES: 42 mg/dL (ref 0.0–149.0)
VLDL: 8.4 mg/dL (ref 0.0–40.0)

## 2014-02-05 LAB — POCT URINALYSIS DIPSTICK
Bilirubin, UA: NEGATIVE
Glucose, UA: NEGATIVE
Ketones, UA: NEGATIVE
Leukocytes, UA: NEGATIVE
Nitrite, UA: NEGATIVE
PROTEIN UA: NEGATIVE
RBC UA: NEGATIVE
UROBILINOGEN UA: 0.2
pH, UA: 6.5

## 2014-02-05 LAB — HEPATIC FUNCTION PANEL
ALBUMIN: 3.7 g/dL (ref 3.5–5.2)
ALT: 16 U/L (ref 0–35)
AST: 17 U/L (ref 0–37)
Alkaline Phosphatase: 28 U/L — ABNORMAL LOW (ref 39–117)
Bilirubin, Direct: 0.1 mg/dL (ref 0.0–0.3)
Total Bilirubin: 0.3 mg/dL (ref 0.2–1.2)
Total Protein: 6.7 g/dL (ref 6.0–8.3)

## 2014-02-05 LAB — TSH: TSH: 1.96 u[IU]/mL (ref 0.35–4.50)

## 2014-02-12 ENCOUNTER — Encounter: Payer: Self-pay | Admitting: Internal Medicine

## 2014-02-12 ENCOUNTER — Ambulatory Visit (INDEPENDENT_AMBULATORY_CARE_PROVIDER_SITE_OTHER): Payer: Medicare Other | Admitting: Internal Medicine

## 2014-02-12 VITALS — BP 142/80 | HR 87 | Temp 98.5°F | Resp 20 | Ht 62.5 in | Wt 199.0 lb

## 2014-02-12 DIAGNOSIS — M1710 Unilateral primary osteoarthritis, unspecified knee: Secondary | ICD-10-CM

## 2014-02-12 DIAGNOSIS — E669 Obesity, unspecified: Secondary | ICD-10-CM | POA: Insufficient documentation

## 2014-02-12 DIAGNOSIS — Z0000 Encounter for general adult medical examination without abnormal findings: Secondary | ICD-10-CM

## 2014-02-12 MED ORDER — TIZANIDINE HCL 4 MG PO TABS: ORAL_TABLET | ORAL | Status: DC

## 2014-02-12 MED ORDER — NABUMETONE 750 MG PO TABS: 750.0000 mg | ORAL_TABLET | Freq: Every day | ORAL | Status: DC

## 2014-02-12 NOTE — Patient Instructions (Signed)
Stop the meloxicam and the aleve

## 2014-02-12 NOTE — Progress Notes (Signed)
Subjective:    Patient ID: [name], female    DOB: 1941-07-31, 73 y.o.   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   reviewed glucose   Review of Systems  Constitutional: Negative for activity change, appetite change and fatigue.  HENT: Positive for sinus pressure. Negative for congestion, ear pain and postnasal drip.   Eyes: Negative for redness and visual disturbance.  Respiratory: Negative for cough, shortness of breath and wheezing.   Gastrointestinal: Negative for abdominal pain and abdominal distention.  Genitourinary: Negative for dysuria, frequency and menstrual problem.  Musculoskeletal: Negative for arthralgias, joint swelling, myalgias and neck pain.  Skin: Negative for rash and wound.  Neurological: Positive for weakness. Negative for dizziness and headaches.  Hematological: Negative for adenopathy. Does not bruise/bleed easily.  Psychiatric/Behavioral: Negative for sleep disturbance and decreased concentration.   Past Medical History  Diagnosis Date  . Allergy   . Depression   . GERD (gastroesophageal reflux disease)   . Hyperlipidemia   . Hypertension   . Arthritis     History   Social History  . Marital Status: Married    Spouse Name: N/A    Number of Children: N/A  . Years of Education: N/A   Occupational History  . Not on file.   Social History Main Topics  . Smoking status: Never Smoker   . Smokeless tobacco: Not on file  . Alcohol Use: No  . Drug Use: No  . Sexual Activity: Yes   Other Topics Concern  . Not on file   Social History Narrative  . No narrative on file    Past Surgical History  Procedure Laterality Date  . Lumbar laminectomy    . Lumbar fusion    . Gastric surgeryobesity type and hh surgeon matt martin      Family History  Problem  Relation Age of Onset  . Heart disease Mother   . Heart disease Father   . Diabetes Paternal Grandfather     Allergies  Allergen Reactions  . Prednisone   . Thiazide-Type Diuretics Other (See Comments)    "could not physically move"    Current Outpatient Prescriptions on File Prior to Visit  Medication Sig Dispense Refill  . acetaminophen (TYLENOL) 500 MG tablet Take 500 mg by mouth every 6 (six) hours as needed.        . ALPRAZolam (XANAX) 0.25 MG tablet Take 1 tablet (0.25 mg total) by mouth 2 (two) times daily as needed for anxiety.  90 tablet  1  . aspirin EC 81 MG tablet Take 81 mg by mouth daily.        . B Complex-C (SUPER B COMPLEX/VITAMIN C PO) Take 1 tablet by mouth daily.        . calcium carbonate (OS-CAL - DOSED IN MG OF ELEMENTAL CALCIUM) 1250 MG tablet Take 1 tablet by mouth daily.        . cholecalciferol (VITAMIN D) 1000 UNITS tablet Take 5,000 Units by mouth daily.        . Choline Fenofibrate (FENOFIBRIC ACID) 135 MG CPDR TAKE (1) CAPSULE DAILY.  30 capsule  5  . diphenhydrAMINE (BENADRYL) 25 MG tablet Take 25 mg by mouth at bedtime as needed.      . DULoxetine (CYMBALTA) 60 MG capsule Take  1 capsule (60 mg total) by mouth daily.  30 capsule  11  . famotidine (PEPCID) 20 MG tablet Take 20 mg by mouth daily.      . fluticasone (FLONASE) 50 MCG/ACT nasal spray Place 2 sprays into the nose at bedtime.  16 g  2  . losartan-hydrochlorothiazide (HYZAAR) 100-12.5 MG per tablet TAKE 1 TABLET DAILY.  30 tablet  11  . mupirocin ointment (BACTROBAN) 2 % Apply topically 3 (three) times daily.  22 g  0  . Omega-3 Fatty Acids (TH FISH OIL EXTRA STRENGTH) 1200 MG CAPS Take 1 capsule by mouth 2 (two) times daily.      . verapamil (CALAN-SR) 240 MG CR tablet TAKE 1 TABLET TWICE DAILY.  60 tablet  11   No current facility-administered medications on file prior to visit.    BP 142/80  Pulse 87  Temp(Src) 98.5 F (36.9 C) (Oral)  Resp 20  Ht 5' 2.5" (1.588 m)  Wt 199 lb (90.266  kg)  BMI 35.80 kg/m2  SpO2 96%        Objective:   Physical Exam  Nursing note and vitals reviewed. Constitutional: She is oriented to person, place, and time. She appears well-developed and well-nourished. No distress.  HENT:  Head: Normocephalic and atraumatic.  Eyes: Conjunctivae and EOM are normal. Pupils are equal, round, and reactive to light.  Neck: Normal range of motion. Neck supple. No JVD present. No tracheal deviation present. No thyromegaly present.  Cardiovascular: Normal rate and regular rhythm.   No murmur heard. Pulmonary/Chest: Effort normal and breath sounds normal. She has no wheezes. She exhibits no tenderness.  Abdominal: Soft. Bowel sounds are normal.  Musculoskeletal: Normal range of motion. She exhibits no edema and no tenderness.  Lymphadenopathy:    She has no cervical adenopathy.  Neurological: She is alert and oriented to person, place, and time. She has normal reflexes. No cranial nerve deficit.  Skin: Skin is warm and dry. She is not diaphoretic.  Psychiatric: She has a normal mood and affect. Her behavior is normal.          Assessment & Plan:    This is a routine wellness  examination for this patient . I reviewed all health maintenance protocols including mammography, colonoscopy, bone density Needed referrals were placed. Age and diagnosis  appropriate screening labs were ordered. Her immunization history was reviewed and appropriate vaccinations were ordered. Her current medications and allergies were reviewed and needed refills of her chronic medications were ordered. The plan for yearly health maintenance was discussed all orders and referrals were made as appropriate. Consider knee replacement  Weight loss Fall risk high due to gate instability

## 2014-02-12 NOTE — Progress Notes (Signed)
Pre-visit discussion using our clinic review tool. No additional management support is needed unless otherwise documented below in the visit note.  

## 2014-03-09 ENCOUNTER — Ambulatory Visit: Payer: Medicare Other

## 2014-03-11 ENCOUNTER — Other Ambulatory Visit: Payer: Self-pay | Admitting: Internal Medicine

## 2014-04-12 ENCOUNTER — Other Ambulatory Visit: Payer: Self-pay | Admitting: Internal Medicine

## 2014-04-26 ENCOUNTER — Encounter: Payer: Self-pay | Admitting: Internal Medicine

## 2014-05-31 ENCOUNTER — Telehealth: Payer: Self-pay | Admitting: Internal Medicine

## 2014-05-31 NOTE — Telephone Encounter (Signed)
Pt states she needs a letter for knee surgery clearance for dr Alvan Dame . Pt has appt w/ Dr Doug Sou but is going to have to cancel this appt b/c its 3 days after her surgery .  Pt would like to know if Dr Arnoldo Morale will write this.  Pt's surgery is 10/26

## 2014-05-31 NOTE — Telephone Encounter (Signed)
Please schedule with [facility] or [facility].  She needs an ekg and surgical clearance

## 2014-06-03 ENCOUNTER — Ambulatory Visit (AMBULATORY_SURGERY_CENTER): Payer: Self-pay | Admitting: *Deleted

## 2014-06-03 VITALS — Ht 63.0 in | Wt 199.0 lb

## 2014-06-03 DIAGNOSIS — Z1211 Encounter for screening for malignant neoplasm of colon: Secondary | ICD-10-CM

## 2014-06-03 NOTE — Progress Notes (Signed)
No allergies to eggs or soy. No problems with anesthesia.  Pt given Emmi instructions for colonoscopy  No oxygen use  No diet drug use  

## 2014-06-09 NOTE — Telephone Encounter (Signed)
Pt's spouse called in regarding surgical clearance, they were never contacted for an appointment.  Pt is now scheduled for 06/10/14 at 3:45

## 2014-06-10 ENCOUNTER — Ambulatory Visit (INDEPENDENT_AMBULATORY_CARE_PROVIDER_SITE_OTHER): Payer: Medicare Other | Admitting: Physician Assistant

## 2014-06-10 ENCOUNTER — Encounter: Payer: Self-pay | Admitting: Physician Assistant

## 2014-06-10 ENCOUNTER — Other Ambulatory Visit: Payer: Self-pay | Admitting: Geriatric Medicine

## 2014-06-10 VITALS — BP 130/82 | HR 72 | Temp 98.4°F | Resp 18 | Wt 196.1 lb

## 2014-06-10 DIAGNOSIS — I10 Essential (primary) hypertension: Secondary | ICD-10-CM

## 2014-06-10 DIAGNOSIS — Z23 Encounter for immunization: Secondary | ICD-10-CM

## 2014-06-10 DIAGNOSIS — Z01818 Encounter for other preprocedural examination: Secondary | ICD-10-CM

## 2014-06-10 MED ORDER — ALPRAZOLAM 0.25 MG PO TABS: 0.2500 mg | ORAL_TABLET | Freq: Two times a day (BID) | ORAL | Status: DC | PRN

## 2014-06-10 NOTE — Progress Notes (Signed)
Subjective:    Patient ID: [name], female    DOB: October 10, 1940, 73 y.o.   MRN: [id number]  HPI Patient is a 73 y.o. female presenting for surgical clearance. Pt is here today to obtain surgical clearance for a right partial knee replacement.  HTN- Stable on Hyzaar and verapamil. She is normotensive on this combination and has for many years.  HLD- Currently on Fenofibrate. Her cholesterol is well controlled on this alone.  No history of prior CVA or TIA. No chest pain history or history of heart murmurs or a-fib. No history of diabetes. Patient denies fevers, chills, nausea, vomiting, diarrhea, shortness of breath, chest pain, headache, syncope.  Review of Systems As per HPI and are otherwise negative.   Past Medical History  Diagnosis Date  . Allergy   . Depression   . GERD (gastroesophageal reflux disease)   . Hyperlipidemia   . Hypertension   . Arthritis   . Anxiety   . Sleep apnea     does not tolerate sleep apnea  . Heart murmur     History   Social History  . Marital Status: Married    Spouse Name: N/A    Number of Children: N/A  . Years of Education: N/A   Occupational History  . Not on file.   Social History Main Topics  . Smoking status: Never Smoker   . Smokeless tobacco: Never Used  . Alcohol Use: No  . Drug Use: No  . Sexual Activity: Yes   Other Topics Concern  . Not on file   Social History Narrative  . No narrative on file    Past Surgical History  Procedure Laterality Date  . Lumbar laminectomy    . Lumbar fusion    . Hiatal hernia repair  2008    Family History  Problem Relation Age of Onset  . Heart disease Mother   . Heart disease Father   . Diabetes Paternal Grandfather     Allergies  Allergen Reactions  . Ambien [Zolpidem Tartrate]     amnesia  . Prednisone     Rapid heart rate  . Thiazide-Type Diuretics Other (See Comments)    "could not physically move"    Current Outpatient Prescriptions on File Prior  to Visit  Medication Sig Dispense Refill  . acetaminophen (TYLENOL) 500 MG tablet Take 500 mg by mouth every 6 (six) hours as needed.        Marland Kitchen aspirin EC 81 MG tablet Take 81 mg by mouth daily.        . Bisacodyl (DULCOLAX PO) Take by mouth as directed. As directed for colonoscopy prep      . cholecalciferol (VITAMIN D) 1000 UNITS tablet Take 5,000 Units by mouth daily.        . Choline Fenofibrate (FENOFIBRIC ACID) 135 MG CPDR TAKE (1) CAPSULE DAILY.  30 capsule  5  . diphenhydrAMINE (BENADRYL) 25 MG tablet Take 25 mg by mouth at bedtime as needed.      . fluticasone (FLONASE) 50 MCG/ACT nasal spray Place 2 sprays into the nose at bedtime.  16 g  2  . Glucosamine & Fish Oil 500-400-60-40 MG CAPS Take by mouth daily.      Marland Kitchen losartan-hydrochlorothiazide (HYZAAR) 100-12.5 MG per tablet TAKE 1 TABLET DAILY.  30 tablet  11  . meloxicam (MOBIC) 15 MG tablet TAKE 1 TABLET ONCE DAILY.  30 tablet  9  . mupirocin ointment (BACTROBAN) 2 % Apply topically 3 (three) times  daily.  22 g  0  . nabumetone (RELAFEN) 750 MG tablet Take 1 tablet (750 mg total) by mouth daily.  60 tablet  5  . Polyethylene Glycol 3350 (MIRALAX PO) Take by mouth. As directed for colonoscopy prep      . tiZANidine (ZANAFLEX) 4 MG tablet TAKE 1 TABLET 3 TIMES A DAY.  90 tablet  3  . verapamil (CALAN-SR) 240 MG CR tablet TAKE 1 TABLET TWICE DAILY.  60 tablet  5   No current facility-administered medications on file prior to visit.    EXAM: BP 130/82  Pulse 72  Temp(Src) 98.4 F (36.9 C) (Oral)  Resp 18  Wt 196 lb 1.6 oz (88.95 kg)      Objective:   Physical Exam  Nursing note and vitals reviewed. Constitutional: She is oriented to person, place, and time. She appears well-developed and well-nourished. No distress.  HENT:  Head: Normocephalic and atraumatic.  Eyes: Conjunctivae and EOM are normal.  Cardiovascular: Normal rate, regular rhythm, normal heart sounds and intact distal pulses.   No carotid bruits.    Pulmonary/Chest: Effort normal and breath sounds normal. No respiratory distress. She has no wheezes. She has no rales. She exhibits no tenderness.  Neurological: She is alert and oriented to person, place, and time.  Skin: Skin is warm and dry. No rash noted. She is not diaphoretic. No erythema. No pallor.  Psychiatric: She has a normal mood and affect. Her behavior is normal. Judgment and thought content normal.     Lab Results  Component Value Date   WBC 5.8 02/05/2014   HGB 12.9 02/05/2014   HCT 38.8 02/05/2014   PLT 340.0 02/05/2014   GLUCOSE 108* 02/05/2014   CHOL 158 02/05/2014   TRIG 42.0 02/05/2014   HDL 55.40 02/05/2014   LDLDIRECT 125.1 12/13/2009   LDLCALC 94 02/05/2014   ALT 16 02/05/2014   AST 17 02/05/2014   NA 139 02/05/2014   K 4.2 02/05/2014   CL 104 02/05/2014   CREATININE 0.7 02/05/2014   BUN 23 02/05/2014   CO2 30 02/05/2014   TSH 1.96 02/05/2014   INR 0.97 06/26/2011   HGBA1C 6.0 08/09/2009        Assessment & Plan:  [name] was seen today for medical clearance.  Diagnoses and associated orders for this visit:  Preoperative clearance Comments: Over all well, ekg wnl, reasonably screened, low risk, clear for surgery. - EKG 12-Lead  Essential hypertension Comments: Stable on current regimen, will continue.  Need for influenza vaccination - Flu Vaccine QUAD 36+ mos PF IM (Fluarix Quad PF)  Need for pneumococcal vaccination - Pneumococcal conjugate vaccine 13-valent    Spoke with Dr. Elease Hashimoto regarding the pt, and as she has no history of CVA, TIA, DM, afib, or other cardiac history, and a well controlled history of HTN, she seems to be low risk for the partial knee replacement. In addition, she will not be undergoing general anesthesia for the surgery, which further decreases her risk. She has been reasonably screened, and is clear for surgery at this time.  Return precautions provided, and patient handout on HTN.  Plan to follow up as needed, or for worsening or persistent  symptoms despite treatment.  Patient Instructions  Remember to make sure you take your medications as directed by the surgical center.   If emergency symptoms discussed during visit developed, seek medical attention immediately.  Followup as needed, or for worsening or persistent symptoms despite treatment.

## 2014-06-10 NOTE — Patient Instructions (Addendum)
Remember to make sure you take your medications as directed by the surgical center.   If emergency symptoms discussed during visit developed, seek medical attention immediately.  Followup as needed, or for worsening or persistent symptoms despite treatment.    Hypertension Hypertension is another name for high blood pressure. High blood pressure forces your heart to work harder to pump blood. A blood pressure reading has two numbers, which includes a higher number over a lower number (example: 110/72). HOME CARE   Have your blood pressure rechecked by your doctor.  Only take medicine as told by your doctor. Follow the directions carefully. The medicine does not work as well if you skip doses. Skipping doses also puts you at risk for problems.  Do not smoke.  Monitor your blood pressure at home as told by your doctor. GET HELP IF:  You think you are having a reaction to the medicine you are taking.  You have repeat headaches or feel dizzy.  You have puffiness (swelling) in your ankles.  You have trouble with your vision. GET HELP RIGHT AWAY IF:   You get a very bad headache and are confused.  You feel weak, numb, or faint.  You get chest or belly (abdominal) pain.  You throw up (vomit).  You cannot breathe very well. MAKE SURE YOU:   Understand these instructions.  Will watch your condition.  Will get help right away if you are not doing well or get worse. Document Released: 02/06/2008 Document Revised: 08/25/2013 Document Reviewed: 06/12/2013 [facility] Patient Information 2015 [place], [place]. This information is not intended to replace advice given to you by your health care provider. Make sure you discuss any questions you have with your health care provider.

## 2014-06-10 NOTE — Progress Notes (Signed)
Pre visit review using our clinic review tool, if applicable. No additional management support is needed unless otherwise documented below in the visit note. 

## 2014-06-11 ENCOUNTER — Telehealth: Payer: Self-pay | Admitting: Internal Medicine

## 2014-06-11 ENCOUNTER — Encounter: Payer: Self-pay | Admitting: Internal Medicine

## 2014-06-11 NOTE — Telephone Encounter (Signed)
Called and spoke with the pt and informed her that her Alprazolam was filled on (06/10/14) #60,1 by Dr. Alfonse Ras.  And I have called [facility] and they have it waiting for her.  Pt stated that she received a call from the pharmacy letting her know it was filled and ready for her to pick up.//AB/CMA

## 2014-06-11 NOTE — Telephone Encounter (Signed)
Pt saw PA yesterday and forgot to ask for a refill on alprazolam call into [facility]

## 2014-06-21 ENCOUNTER — Encounter: Payer: Self-pay | Admitting: Internal Medicine

## 2014-06-21 ENCOUNTER — Ambulatory Visit (AMBULATORY_SURGERY_CENTER): Payer: Medicare Other | Admitting: Internal Medicine

## 2014-06-21 ENCOUNTER — Encounter (HOSPITAL_COMMUNITY): Payer: Self-pay | Admitting: Pharmacy Technician

## 2014-06-21 VITALS — BP 151/85 | HR 78 | Temp 97.8°F | Resp 16 | Ht 63.6 in | Wt 199.0 lb

## 2014-06-21 DIAGNOSIS — Z1211 Encounter for screening for malignant neoplasm of colon: Secondary | ICD-10-CM

## 2014-06-21 DIAGNOSIS — D175 Benign lipomatous neoplasm of intra-abdominal organs: Secondary | ICD-10-CM

## 2014-06-21 DIAGNOSIS — K5730 Diverticulosis of large intestine without perforation or abscess without bleeding: Secondary | ICD-10-CM

## 2014-06-21 MED ORDER — SODIUM CHLORIDE 0.9 % IV SOLN: 500.0000 mL | INTRAVENOUS | Status: DC

## 2014-06-21 NOTE — Op Note (Signed)
[place]  [address]. [place], [address]   COLONOSCOPY PROCEDURE REPORT  PATIENT: Felicia, Brown  MR#: [id number] BIRTHDATE: [birth year] , 24  yrs. old GENDER: female ENDOSCOPIST: Gatha Mayer, MD, [facility] PROCEDURE DATE:  06/21/2014 PROCEDURE:   Colonoscopy, screening First Screening Colonoscopy - Avg.  risk and is 50 yrs.  old or older - No.  Prior Negative Screening - Now for repeat screening. 10 or more years since last screening  History of Adenoma - Now for follow-up colonoscopy & has been > or = to 3 yrs.  N/A  Polyps Removed Today? No.  Polyps Removed Today? No.  Recommend repeat exam, <10 yrs? Polyps Removed Today? No.  Recommend repeat exam, <10 yrs? No. ASA CLASS:   Class II INDICATIONS:average risk for colorectal cancer. MEDICATIONS: Propofol 200 mg IV and Monitored anesthesia care  DESCRIPTION OF PROCEDURE:   After the risks benefits and alternatives of the procedure were thoroughly explained, informed consent was obtained.  The digital rectal exam revealed no abnormalities of the rectum.   The LB [id number]  endoscope was introduced through the anus and advanced to the cecum, which was identified by both the appendix and ileocecal valve. No adverse events experienced.   The quality of the prep was good, using MiraLax  The instrument was then slowly withdrawn as the colon was fully examined.      COLON FINDINGS: There was severe diverticulosis noted throughout the entire examined colon.   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   The examination was otherwise normal.  Retroflexed views revealed no abnormalities. The time to cecum=5 minutes 15 seconds. Withdrawal time=10 minutes 35 seconds.  The scope was withdrawn and the procedure completed. COMPLICATIONS: There were no immediate complications.  ENDOSCOPIC IMPRESSION: 1.   Severe diverticulosis was noted throughout the entire examined colon 2.   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   The examination was otherwise normal  RECOMMENDATIONS: Outpatient GI follow-up is advised on a PRN basis.  eSigned:  Gatha Mayer, MD, [facility] 06/21/2014 2:32 PM   cc: The Patient

## 2014-06-21 NOTE — Progress Notes (Signed)
A/ox3 pleased with MAC, report to Karen RN 

## 2014-06-21 NOTE — Patient Instructions (Signed)

## 2014-06-22 ENCOUNTER — Telehealth: Payer: Self-pay

## 2014-06-22 NOTE — Telephone Encounter (Signed)
  Follow up Call-  Call back number 06/21/2014  Post procedure Call Back phone  # [phone]  Permission to leave phone message Yes     Patient questions:  Do you have a fever, pain , or abdominal swelling? No. Pain Score  0 *  Have you tolerated food without any problems? Yes.    Have you been able to return to your normal activities? Yes.    Do you have any questions about your discharge instructions: Diet   No. Medications  No. Follow up visit  No.  Do you have questions or concerns about your Care? No.  Actions: * If pain score is 4 or above: No action needed, pain <4.

## 2014-06-24 ENCOUNTER — Ambulatory Visit (HOSPITAL_COMMUNITY)
Admission: RE | Admit: 2014-06-24 | Discharge: 2014-06-24 | Disposition: A | Discharge status: Home / Self Care | Payer: Medicare Other | Source: Ambulatory Visit | Attending: Anesthesiology | Admitting: Anesthesiology

## 2014-06-24 ENCOUNTER — Encounter (HOSPITAL_COMMUNITY): Payer: Self-pay

## 2014-06-24 ENCOUNTER — Encounter (HOSPITAL_COMMUNITY)
Admission: RE | Admit: 2014-06-24 | Discharge: 2014-06-24 | Disposition: A | Discharge status: Home / Self Care | Payer: Medicare Other | Source: Ambulatory Visit | Attending: Orthopedic Surgery | Admitting: Orthopedic Surgery

## 2014-06-24 DIAGNOSIS — R05 Cough: Secondary | ICD-10-CM | POA: Insufficient documentation

## 2014-06-24 DIAGNOSIS — Z01810 Encounter for preprocedural cardiovascular examination: Secondary | ICD-10-CM | POA: Diagnosis present

## 2014-06-24 DIAGNOSIS — I10 Essential (primary) hypertension: Secondary | ICD-10-CM

## 2014-06-24 DIAGNOSIS — M4326 Fusion of spine, lumbar region: Secondary | ICD-10-CM | POA: Diagnosis not present

## 2014-06-24 HISTORY — DX: Diverticulosis of intestine, part unspecified, without perforation or abscess without bleeding: K57.90

## 2014-06-24 HISTORY — DX: Adverse effect of unspecified anesthetic, initial encounter: T41.45XA

## 2014-06-24 HISTORY — DX: Other complications of anesthesia, initial encounter: T88.59XA

## 2014-06-24 LAB — BASIC METABOLIC PANEL
Anion gap: 13 (ref 5–15)
BUN: 12 mg/dL (ref 6–23)
CHLORIDE: 99 meq/L (ref 96–112)
CO2: 26 mEq/L (ref 19–32)
Calcium: 9.9 mg/dL (ref 8.4–10.5)
Creatinine, Ser: 0.63 mg/dL (ref 0.50–1.10)
GFR calc non Af Amer: 86 mL/min — ABNORMAL LOW (ref >90)
Glucose, Bld: 119 mg/dL — ABNORMAL HIGH (ref 70–99)
POTASSIUM: 3.9 meq/L (ref 3.7–5.3)
Sodium: 138 mEq/L (ref 137–147)

## 2014-06-24 LAB — SURGICAL PCR SCREEN
MRSA, PCR: NEGATIVE
STAPHYLOCOCCUS AUREUS: NEGATIVE

## 2014-06-24 LAB — CBC
HCT: 40.7 % (ref 36.0–46.0)
Hemoglobin: 13.6 g/dL (ref 12.0–15.0)
MCH: 30.6 pg (ref 26.0–34.0)
MCHC: 33.4 g/dL (ref 30.0–36.0)
MCV: 91.5 fL (ref 78.0–100.0)
Platelets: 318 10*3/uL (ref 150–400)
RBC: 4.45 MIL/uL (ref 3.87–5.11)
RDW: 12.6 % (ref 11.5–15.5)
WBC: 6.3 10*3/uL (ref 4.0–10.5)

## 2014-06-24 LAB — URINALYSIS, ROUTINE W REFLEX MICROSCOPIC
BILIRUBIN URINE: NEGATIVE
Glucose, UA: NEGATIVE mg/dL
Hgb urine dipstick: NEGATIVE
Ketones, ur: NEGATIVE mg/dL
NITRITE: NEGATIVE
PROTEIN: NEGATIVE mg/dL
Specific Gravity, Urine: 1.024 (ref 1.005–1.030)
Urobilinogen, UA: 0.2 mg/dL (ref 0.0–1.0)
pH: 5 (ref 5.0–8.0)

## 2014-06-24 LAB — URINE MICROSCOPIC-ADD ON

## 2014-06-24 LAB — PROTIME-INR
INR: 1.06 (ref 0.00–1.49)
PROTHROMBIN TIME: 14 s (ref 11.6–15.2)

## 2014-06-24 LAB — APTT: aPTT: 28 seconds (ref 24–37)

## 2014-06-24 LAB — ABO/RH: ABO/RH(D): A NEG

## 2014-06-24 NOTE — Patient Instructions (Addendum)
YOUR SURGERY IS SCHEDULED AT [facility] [address]  ON:  Monday  10/26  REPORT TO  SHORT STAY CENTER AT:  11:30 AM   DO NOT EAT  ANYTHING AFTER MIDNIGHT THE NIGHT BEFORE YOUR SURGERY.      NO FOOD, NO CHEWING GUM, NO MINTS, NO CANDIES, NO CHEWING TOBACCO. YOU MAY HAVE CLEAR LIQUIDS TO DRINK FROM MIDNIGHT UNTIL 7:30 AM DAY OF SURGERY - LIKE WATER,                                                  NOTHING TO DRINK AFTER 7:30 AM DAY OF YOUR SURGERY.    PLEASE TAKE THE FOLLOWING MEDICATIONS THE AM OF YOUR SURGERY WITH A FEW SIPS OF WATER:  ALPRAZOLAM ( XANAX ),  VERAPAMIL ( CALAN ).    DO NOT BRING VALUABLES, MONEY, CREDIT CARDS.  DO NOT WEAR JEWELRY, MAKE-UP, NAIL POLISH AND NO METAL PINS OR CLIPS IN YOUR HAIR. CONTACT LENS, DENTURES / PARTIALS, GLASSES SHOULD NOT BE WORN TO SURGERY AND IN MOST CASES-HEARING AIDS WILL NEED TO BE REMOVED.  BRING YOUR GLASSES CASE, ANY EQUIPMENT NEEDED FOR YOUR CONTACT LENS. FOR PATIENTS ADMITTED TO THE HOSPITAL--CHECK OUT TIME THE DAY OF DISCHARGE IS 11:00 AM.  ALL INPATIENT ROOMS ARE PRIVATE - WITH BATHROOM, TELEPHONE, TELEVISION AND WIFI INTERNET.    PLEASE BE AWARE THAT YOU MAY NEED ADDITIONAL BLOOD DRAWN DAY OF YOUR SURGERY  _______________________________________________________________________   [facility] - Preparing for Surgery Before surgery, you can play an important role.  Because skin is not sterile, your skin needs to be as free of germs as possible.  You can reduce the number of germs on your skin by washing with CHG (chlorahexidine gluconate) soap before surgery.  CHG is an antiseptic cleaner which kills germs and bonds with the skin to continue killing germs even after washing. Please DO NOT use if you have an allergy to CHG or antibacterial soaps.  If your skin becomes reddened/irritated stop using the CHG and inform your nurse when you arrive at Short Stay. Do not shave (including legs and underarms) for at least 48 hours prior to  the first CHG shower.  You may shave your face/neck. Please follow these instructions carefully:  1.  Shower with CHG Soap the night before surgery and the  morning of Surgery.  2.  If you choose to wash your hair, wash your hair first as usual with your  normal  shampoo.  3.  After you shampoo, rinse your hair and body thoroughly to remove the  shampoo.                           4.  Use CHG as you would any other liquid soap.  You can apply chg directly  to the skin and wash                       Gently with a scrungie or clean washcloth.  5.  Apply the CHG Soap to your body ONLY FROM THE NECK DOWN.   Do not use on face/ open                           Wound or open sores. Avoid contact with  eyes, ears mouth and genitals (private parts).                       Wash face,  Genitals (private parts) with your normal soap.             6.  Wash thoroughly, paying special attention to the area where your surgery  will be performed.  7.  Thoroughly rinse your body with warm water from the neck down.  8.  DO NOT shower/wash with your normal soap after using and rinsing off  the CHG Soap.                9.  Pat yourself dry with a clean towel.            10.  Wear clean pajamas.            11.  Place clean sheets on your bed the night of your first shower and do not  sleep with pets. Day of Surgery : Do not apply any lotions/deodorants the morning of surgery.  Please wear clean clothes to the hospital/surgery center.  FAILURE TO FOLLOW THESE INSTRUCTIONS MAY RESULT IN THE CANCELLATION OF YOUR SURGERY PATIENT SIGNATURE_________________________________  NURSE SIGNATURE__________________________________  ________________________________________________________________________   [name]  An incentive spirometer is a tool that can help keep your lungs clear and active. This tool measures how well you are filling your lungs with each breath. Taking long deep breaths may help reverse or  decrease the chance of developing breathing (pulmonary) problems (especially infection) following:  A long period of time when you are unable to move or be active. BEFORE THE PROCEDURE   If the spirometer includes an indicator to show your best effort, your nurse or respiratory therapist will set it to a desired goal.  If possible, sit up straight or lean slightly forward. Try not to slouch.  Hold the incentive spirometer in an upright position. INSTRUCTIONS FOR USE  1. Sit on the edge of your bed if possible, or sit up as far as you can in bed or on a chair. 2. Hold the incentive spirometer in an upright position. 3. Breathe out normally. 4. Place the mouthpiece in your mouth and seal your lips tightly around it. 5. Breathe in slowly and as deeply as possible, raising the piston or the ball toward the top of the column. 6. Hold your breath for 3-5 seconds or for as long as possible. Allow the piston or ball to fall to the bottom of the column. 7. Remove the mouthpiece from your mouth and breathe out normally. 8. Rest for a few seconds and repeat Steps 1 through 7 at least 10 times every 1-2 hours when you are awake. Take your time and take a few normal breaths between deep breaths. 9. The spirometer may include an indicator to show your best effort. Use the indicator as a goal to work toward during each repetition. 10. After each set of 10 deep breaths, practice coughing to be sure your lungs are clear. If you have an incision (the cut made at the time of surgery), support your incision when coughing by placing a pillow or rolled up towels firmly against it. Once you are able to get out of bed, walk around indoors and cough well. You may stop using the incentive spirometer when instructed by your caregiver.  RISKS AND COMPLICATIONS  Take your time so you do not get dizzy or light-headed.  If you are  in pain, you may need to take or ask for pain medication before doing incentive spirometry.  It is harder to take a deep breath if you are having pain. AFTER USE  Rest and breathe slowly and easily.  It can be helpful to keep track of a log of your progress. Your caregiver can provide you with a simple table to help with this. If you are using the spirometer at home, follow these instructions: [place] IF:   You are having difficultly using the spirometer.  You have trouble using the spirometer as often as instructed.  Your pain medication is not giving enough relief while using the spirometer.  You develop fever of 100.5 F (38.1 C) or higher. SEEK IMMEDIATE MEDICAL CARE IF:   You cough up bloody sputum that had not been present before.  You develop fever of 102 F (38.9 C) or greater.  You develop worsening pain at or near the incision site. MAKE SURE YOU:   Understand these instructions.  Will watch your condition.  Will get help right away if you are not doing well or get worse. Document Released: 12/31/2006 Document Revised: 11/12/2011 Document Reviewed: 03/03/2007 [employer] Patient Information 2014 [employer], [place].   ________________________________________________________________________  WHAT IS A BLOOD TRANSFUSION? Blood Transfusion Information  A transfusion is the replacement of blood or some of its parts. Blood is made up of multiple cells which provide different functions.  Red blood cells carry oxygen and are used for blood loss replacement.  White blood cells fight against infection.  Platelets control bleeding.  Plasma helps clot blood.  Other blood products are available for specialized needs, such as hemophilia or other clotting disorders. BEFORE THE TRANSFUSION  Who gives blood for transfusions?   Healthy volunteers who are fully evaluated to make sure their blood is safe. This is blood bank blood. Transfusion therapy is the safest it has ever been in the practice of medicine. Before blood is taken from a donor, a complete  history is taken to make sure that person has no history of diseases nor engages in risky social behavior (examples are intravenous drug use or sexual activity with multiple partners). The donor's travel history is screened to minimize risk of transmitting infections, such as malaria. The donated blood is tested for signs of infectious diseases, such as HIV and hepatitis. The blood is then tested to be sure it is compatible with you in order to minimize the chance of a transfusion reaction. If you or a relative donates blood, this is often done in anticipation of surgery and is not appropriate for emergency situations. It takes many days to process the donated blood. RISKS AND COMPLICATIONS Although transfusion therapy is very safe and saves many lives, the main dangers of transfusion include:   Getting an infectious disease.  Developing a transfusion reaction. This is an allergic reaction to something in the blood you were given. Every precaution is taken to prevent this. The decision to have a blood transfusion has been considered carefully by your caregiver before blood is given. Blood is not given unless the benefits outweigh the risks. AFTER THE TRANSFUSION  Right after receiving a blood transfusion, you will usually feel much better and more energetic. This is especially true if your red blood cells have gotten low (anemic). The transfusion raises the level of the red blood cells which carry oxygen, and this usually causes an energy increase.  The nurse administering the transfusion will monitor you carefully for complications. HOME CARE  INSTRUCTIONS  No special instructions are needed after a transfusion. You may find your energy is better. Speak with your caregiver about any limitations on activity for underlying diseases you may have. SEEK MEDICAL CARE IF:   Your condition is not improving after your transfusion.  You develop redness or irritation at the intravenous (IV) site. SEEK  IMMEDIATE MEDICAL CARE IF:  Any of the following symptoms occur over the next 12 hours:  Shaking chills.  You have a temperature by mouth above 102 F (38.9 C), not controlled by medicine.  Chest, back, or muscle pain.  People around you feel you are not acting correctly or are confused.  Shortness of breath or difficulty breathing.  Dizziness and fainting.  You get a rash or develop hives.  You have a decrease in urine output.  Your urine turns a dark color or changes to pink, red, or brown. Any of the following symptoms occur over the next 10 days:  You have a temperature by mouth above 102 F (38.9 C), not controlled by medicine.  Shortness of breath.  Weakness after normal activity.  The white part of the eye turns yellow (jaundice).  You have a decrease in the amount of urine or are urinating less often.  Your urine turns a dark color or changes to pink, red, or brown. Document Released: 08/17/2000 Document Revised: 11/12/2011 Document Reviewed: 04/05/2008 [facility] Patient Information 2014 [place], [place].  _______________________________________________________________________

## 2014-06-24 NOTE — Anesthesia Preprocedure Evaluation (Addendum)
Anesthesia Evaluation  Patient identified by MRN, date of birth, ID band Patient awake    Reviewed: Allergy & Precautions, H&P , NPO status , Patient's Chart, lab work & pertinent test results  History of Anesthesia Complications (+) Emergence Delirium and history of anesthetic complications  Airway Mallampati: II  TM Distance: >3 FB Neck ROM: Full    Dental no notable dental hx. (+) Dental Advisory Given   Pulmonary sleep apnea ,  breath sounds clear to auscultation  Pulmonary exam normal       Cardiovascular hypertension, Pt. on medications + Valvular Problems/Murmurs Rhythm:Regular Rate:Normal     Neuro/Psych PSYCHIATRIC DISORDERS Anxiety Depression negative neurological ROS     GI/Hepatic Neg liver ROS, GERD-  Medicated and Controlled,  Endo/Other  negative endocrine ROS  Renal/GU negative Renal ROS  negative genitourinary   Musculoskeletal  (+) Arthritis -, Osteoarthritis,    Abdominal   Peds negative pediatric ROS (+)  Hematology negative hematology ROS (+)   Anesthesia Other Findings   Reproductive/Obstetrics negative OB ROS                            Anesthesia Physical Anesthesia Plan  ASA: II  Anesthesia Plan: General   Post-op Pain Management:    Induction: Intravenous  Airway Management Planned: Oral ETT  Additional Equipment:   Intra-op Plan:   Post-operative Plan: Extubation in OR  Informed Consent: I have reviewed the patients History and Physical, chart, labs and discussed the procedure including the risks, benefits and alternatives for the proposed anesthesia with the patient or authorized representative who has indicated his/her understanding and acceptance.   Dental advisory given  Plan Discussed with: CRNA  Anesthesia Plan Comments: (Discussed spinal and general. She has had emergence delirium with general in the past. She has had a lumbar fusion  between L2 and L5. She refuses a femoral nerve block. Plan TIVA with propofol to hopefully give her a better general anesthesia experience. She agrees and consents.)       Anesthesia Quick Evaluation

## 2014-06-24 NOTE — Pre-Procedure Instructions (Addendum)
CXR WAS DONE TODAY PREOP AT [facility]. EKG REPORT 06-10-14 IN EPIC MEDICAL CLEARANCE NOTE ON CHART FROM Bebe Liter, PA -[place]. PT'S URINALYSIS AND URINE MICROSCOPIC REPORTS FAXED TO DR. Aurea Graff OFFICE FOR REVIEW.

## 2014-06-25 NOTE — H&P (Signed)
UNICOMPARTMENTAL KNEE ADMISSION H&P  Patient is being admitted for right medial unicompartmental knee arthroplasty.  Subjective:  Chief Complaint:  Right knee medial compartment OA / pain.  HPI: [name], 73 y.o. female, has a history of pain and functional disability in the right knee due to trauma and has failed non-surgical conservative treatments for greater than 12 weeks to include NSAID's and/or analgesics, corticosteriod injections, viscosupplementation injections, use of assistive devices and activity modification.  Onset of symptoms was gradual, starting 2+ years ago with gradually worsening course since that time. The patient noted no past surgery on the right knee(s).  Patient currently rates pain in the right knee(s) at 10 out of 10 with activity. Patient has worsening of pain with activity and weight bearing, pain that interferes with activities of daily living, pain with passive range of motion, crepitus and joint swelling.  Patient has evidence of periarticular osteophytes and joint space narrowing of the medial compartment by imaging studies. There is no active infection.  Risks, benefits and expectations were discussed with the patient.  Risks including but not limited to the risk of anesthesia, blood clots, nerve damage, blood vessel damage, failure of the prosthesis, infection and up to and including death.  Patient understand the risks, benefits and expectations and wishes to proceed with surgery.   PCP: Olga Millers, MD  D/C Plans:      Home with HHPT  Post-op Meds:       No Rx given   Tranexamic Acid:      To be given - IV    Decadron:      Is to be given  FYI:     ASA post-op  Norco post-op    Patient Active Problem List   Diagnosis Date Noted  . Obesity (BMI 30-39.9) 02/12/2014  . SPONDYLOSIS, LUMBAR, WITH RADICULOPATHY 05/26/2010  . HIP PAIN, LEFT 05/01/2010  . NONSPEC REACT TUBERCULIN SKN TEST W/O ACTV TB 10/19/2009  . [place] DISEASE,  LUMBOSACRAL SPINE 09/07/2008  . HYPERLIPIDEMIA 04/17/2007  . DEPRESSION 04/17/2007  . HYPERTENSION 04/17/2007  . ALLERGIC RHINITIS 04/17/2007  . CYST/PSEUDOCYST, PANCREAS 04/17/2007  . OSTEOARTHRITIS 04/17/2007  . GASTRIC POLYP, HX OF 04/17/2007   Past Medical History  Diagnosis Date  . Allergy   . Depression   . Hyperlipidemia   . Hypertension   . Anxiety   . Diverticulosis     PER COLONOSCOPY  . Heart murmur     "VERY SLIGHT ALL MY LIFE"  . Sleep apnea     does not tolerate CPAP - HAS NOT USED IN OVER 2 YRS  . GERD (gastroesophageal reflux disease)     OVER THE COUNTER MEDS IF NEEDED  . Arthritis     PAIN AND OA RIGHT KNEE; PAIN AND DDD AND SPINAL STENOSIS  . Complication of anesthesia     PULLED OUT MY IV'S AND FOLEY - HALLUCINATIONS WITH HIATAL HERNIA SURGERY    Past Surgical History  Procedure Laterality Date  . Lumbar laminectomy    . Lumbar fusion    . Hiatal hernia repair  2008  . Upper gastrointestinal endoscopy    . Colonoscopy      No prescriptions prior to admission   Allergies  Allergen Reactions  . Ambien [Zolpidem Tartrate]     amnesia  . Prednisone     Rapid heart rate  . Thiazide-Type Diuretics Other (See Comments)    "could not physically move"    History  Substance Use Topics  .  Smoking status: Never Smoker   . Smokeless tobacco: Never Used  . Alcohol Use: No    Family History  Problem Relation Age of Onset  . Heart disease Mother   . Heart disease Father   . Diabetes Paternal Grandfather      Review of Systems  Constitutional: Negative.   HENT: Negative.   Eyes: Negative.   Respiratory: Negative.   Cardiovascular: Negative.   Gastrointestinal: Positive for heartburn.  Genitourinary: Positive for frequency.  Musculoskeletal: Positive for back pain and joint pain.  Skin: Negative.   Neurological: Negative.   Endo/Heme/Allergies: Positive for environmental allergies.  Psychiatric/Behavioral: Positive for depression.     Objective:  Physical Exam  Constitutional: She is oriented to person, place, and time. She appears well-developed and well-nourished.  HENT:  Head: Normocephalic and atraumatic.  Eyes: Pupils are equal, round, and reactive to light.  Neck: Neck supple. No JVD present. No tracheal deviation present. No thyromegaly present.  Cardiovascular: Normal rate, regular rhythm and intact distal pulses.   Murmur heard. Respiratory: Effort normal and breath sounds normal. No stridor.  GI: Soft. There is no tenderness. There is no guarding.  Musculoskeletal:       Right knee: She exhibits decreased range of motion, swelling and bony tenderness. She exhibits no ecchymosis, no deformity, no laceration and no erythema. Tenderness found. Medial joint line tenderness noted. No lateral joint line tenderness noted.  Lymphadenopathy:    She has no cervical adenopathy.  Neurological: She is alert and oriented to person, place, and time.  Skin: Skin is warm and dry.  Psychiatric: She has a normal mood and affect.      Labs:  Estimated body mass index is 35.80 kg/(m^2) as calculated from the following:   Height as of 02/12/14: 5' 2.5" (1.588 m).   Weight as of 02/12/14: 90.266 kg (199 lb).   Imaging Review Plain radiographs demonstrate severe degenerative joint disease of the right knee, medial compartment. The overall alignment is neutral. The bone quality appears to be good for age and reported activity level.  Assessment/Plan:  End stage arthritis, right knee, medial compartment  The patient history, physical examination, clinical judgment of the provider and imaging studies are consistent with end stage degenerative joint disease of the right knee(s) and medial unicompartmental knee arthroplasty is deemed medically necessary. The treatment options including medical management, injection therapy arthroscopy and arthroplasty were discussed at length. The risks and benefits of total knee  arthroplasty were presented and reviewed. The risks due to aseptic loosening, infection, stiffness, patella tracking problems, thromboembolic complications and other imponderables were discussed. The patient acknowledged the explanation, agreed to proceed with the plan and consent was signed. Patient is being admitted for outpatient / observation treatment for surgery, pain control, PT, OT, prophylactic antibiotics, VTE prophylaxis, progressive ambulation and ADL's and discharge planning. The patient is planning to be discharged home with home health services.    West Pugh Recardo Linn   PA-C  06/25/2014, 4:33 PM

## 2014-06-28 ENCOUNTER — Encounter (HOSPITAL_COMMUNITY): Payer: Self-pay | Admitting: *Deleted

## 2014-06-28 ENCOUNTER — Ambulatory Visit (HOSPITAL_COMMUNITY)
Admission: RE | Admit: 2014-06-28 | Discharge: 2014-06-28 | Disposition: A | Discharge status: Home / Self Care | Payer: Medicare Other | Source: Ambulatory Visit | Attending: Orthopedic Surgery | Admitting: Orthopedic Surgery

## 2014-06-28 DIAGNOSIS — I10 Essential (primary) hypertension: Secondary | ICD-10-CM | POA: Insufficient documentation

## 2014-06-28 DIAGNOSIS — E669 Obesity, unspecified: Secondary | ICD-10-CM | POA: Diagnosis not present

## 2014-06-28 DIAGNOSIS — F329 Major depressive disorder, single episode, unspecified: Secondary | ICD-10-CM | POA: Diagnosis not present

## 2014-06-28 DIAGNOSIS — Z6830 Body mass index (BMI) 30.0-30.9, adult: Secondary | ICD-10-CM | POA: Insufficient documentation

## 2014-06-28 DIAGNOSIS — Z888 Allergy status to other drugs, medicaments and biological substances status: Secondary | ICD-10-CM | POA: Diagnosis not present

## 2014-06-28 DIAGNOSIS — M25552 Pain in left hip: Secondary | ICD-10-CM | POA: Insufficient documentation

## 2014-06-28 DIAGNOSIS — G4730 Sleep apnea, unspecified: Secondary | ICD-10-CM | POA: Diagnosis not present

## 2014-06-28 DIAGNOSIS — M179 Osteoarthritis of knee, unspecified: Secondary | ICD-10-CM | POA: Insufficient documentation

## 2014-06-28 DIAGNOSIS — E785 Hyperlipidemia, unspecified: Secondary | ICD-10-CM | POA: Insufficient documentation

## 2014-06-28 DIAGNOSIS — F419 Anxiety disorder, unspecified: Secondary | ICD-10-CM | POA: Diagnosis not present

## 2014-06-28 MED ORDER — FENTANYL CITRATE 0.05 MG/ML IJ SOLN
INTRAMUSCULAR | Status: AC
  Filled 2014-06-28: qty 2

## 2014-06-28 MED ORDER — TRANEXAMIC ACID 100 MG/ML IV SOLN
1000.0000 mg | Freq: Once | INTRAVENOUS | Status: DC
  Filled 2014-06-28: qty 10

## 2014-06-28 MED ORDER — CHLORHEXIDINE GLUCONATE 4 % EX LIQD: 60.0000 mL | Freq: Once | CUTANEOUS | Status: DC

## 2014-06-28 MED ORDER — MIDAZOLAM HCL 2 MG/2ML IJ SOLN
INTRAMUSCULAR | Status: AC
  Filled 2014-06-28: qty 2

## 2014-06-28 MED ORDER — PROPOFOL 10 MG/ML IV BOLUS
INTRAVENOUS | Status: AC
  Filled 2014-06-28: qty 20

## 2014-06-28 MED ORDER — CEFAZOLIN SODIUM-DEXTROSE 2-3 GM-% IV SOLR: 2.0000 g | INTRAVENOUS | Status: DC

## 2014-06-29 NOTE — Progress Notes (Signed)
Called Dr. Aurea Graff scheduler Judeen Hammans asked to please put order in Epic for 07-02-14 surgery per OR schedule Thanks

## 2014-06-29 NOTE — Progress Notes (Signed)
Please put orders in Epic surgery 07-02-14 for Same Day, orders were released 06-28-14

## 2014-06-30 ENCOUNTER — Other Ambulatory Visit: Payer: Self-pay | Admitting: Internal Medicine

## 2014-07-01 ENCOUNTER — Ambulatory Visit: Payer: Medicare Other | Admitting: Internal Medicine

## 2014-07-02 ENCOUNTER — Ambulatory Visit (HOSPITAL_COMMUNITY): Payer: Medicare Other | Admitting: Anesthesiology

## 2014-07-02 ENCOUNTER — Encounter (HOSPITAL_COMMUNITY)
Admission: RE | Disposition: A | Discharge status: Home / Self Care | Payer: Self-pay | Source: Ambulatory Visit | Attending: Orthopedic Surgery

## 2014-07-02 ENCOUNTER — Encounter (HOSPITAL_COMMUNITY): Payer: Self-pay

## 2014-07-02 ENCOUNTER — Encounter (HOSPITAL_COMMUNITY): Payer: Medicare Other | Admitting: Anesthesiology

## 2014-07-02 ENCOUNTER — Observation Stay (HOSPITAL_COMMUNITY)
Admission: RE | Admit: 2014-07-02 | Discharge: 2014-07-03 | Disposition: A | Discharge status: Home / Self Care | Payer: Medicare Other | Source: Ambulatory Visit | Attending: Orthopedic Surgery | Admitting: Orthopedic Surgery

## 2014-07-02 DIAGNOSIS — E669 Obesity, unspecified: Secondary | ICD-10-CM | POA: Insufficient documentation

## 2014-07-02 DIAGNOSIS — K219 Gastro-esophageal reflux disease without esophagitis: Secondary | ICD-10-CM | POA: Diagnosis not present

## 2014-07-02 DIAGNOSIS — K317 Polyp of stomach and duodenum: Secondary | ICD-10-CM | POA: Diagnosis not present

## 2014-07-02 DIAGNOSIS — M179 Osteoarthritis of knee, unspecified: Principal | ICD-10-CM | POA: Insufficient documentation

## 2014-07-02 DIAGNOSIS — Z96651 Presence of right artificial knee joint: Secondary | ICD-10-CM

## 2014-07-02 DIAGNOSIS — M5137 Other intervertebral disc degeneration, lumbosacral region: Secondary | ICD-10-CM | POA: Insufficient documentation

## 2014-07-02 DIAGNOSIS — I10 Essential (primary) hypertension: Secondary | ICD-10-CM | POA: Diagnosis not present

## 2014-07-02 DIAGNOSIS — F329 Major depressive disorder, single episode, unspecified: Secondary | ICD-10-CM | POA: Insufficient documentation

## 2014-07-02 DIAGNOSIS — Z888 Allergy status to other drugs, medicaments and biological substances status: Secondary | ICD-10-CM | POA: Insufficient documentation

## 2014-07-02 DIAGNOSIS — Z6830 Body mass index (BMI) 30.0-30.9, adult: Secondary | ICD-10-CM | POA: Diagnosis not present

## 2014-07-02 DIAGNOSIS — G4730 Sleep apnea, unspecified: Secondary | ICD-10-CM | POA: Diagnosis not present

## 2014-07-02 DIAGNOSIS — F419 Anxiety disorder, unspecified: Secondary | ICD-10-CM | POA: Insufficient documentation

## 2014-07-02 DIAGNOSIS — E785 Hyperlipidemia, unspecified: Secondary | ICD-10-CM | POA: Insufficient documentation

## 2014-07-02 HISTORY — PX: PARTIAL KNEE ARTHROPLASTY: SHX2174

## 2014-07-02 LAB — TYPE AND SCREEN
ABO/RH(D): A NEG
Antibody Screen: NEGATIVE

## 2014-07-02 SURGERY — ARTHROPLASTY, KNEE, UNICOMPARTMENTAL
Anesthesia: Spinal | Laterality: Right

## 2014-07-02 SURGERY — ARTHROPLASTY, KNEE, UNICOMPARTMENTAL
Anesthesia: General | Site: Knee | Laterality: Right

## 2014-07-02 MED ORDER — CELECOXIB 200 MG PO CAPS
200.0000 mg | ORAL_CAPSULE | Freq: Two times a day (BID) | ORAL | Status: DC
  Administered 2014-07-02 – 2014-07-03 (×2): 200 mg via ORAL
  Filled 2014-07-02 (×3): qty 1

## 2014-07-02 MED ORDER — FENTANYL CITRATE 0.05 MG/ML IJ SOLN
INTRAMUSCULAR | Status: DC | PRN
  Administered 2014-07-02: 100 ug via INTRAVENOUS
  Administered 2014-07-02 (×3): 50 ug via INTRAVENOUS

## 2014-07-02 MED ORDER — PROPOFOL 10 MG/ML IV BOLUS
INTRAVENOUS | Status: AC
  Filled 2014-07-02: qty 20

## 2014-07-02 MED ORDER — GLYCOPYRROLATE 0.2 MG/ML IJ SOLN
INTRAMUSCULAR | Status: AC
  Filled 2014-07-02: qty 3

## 2014-07-02 MED ORDER — ONDANSETRON HCL 4 MG/2ML IJ SOLN: 4.0000 mg | Freq: Four times a day (QID) | INTRAMUSCULAR | Status: DC | PRN

## 2014-07-02 MED ORDER — DOCUSATE SODIUM 100 MG PO CAPS
100.0000 mg | ORAL_CAPSULE | Freq: Two times a day (BID) | ORAL | Status: DC
  Administered 2014-07-02 – 2014-07-03 (×2): 100 mg via ORAL

## 2014-07-02 MED ORDER — ONDANSETRON HCL 4 MG/2ML IJ SOLN
INTRAMUSCULAR | Status: AC
  Filled 2014-07-02: qty 2

## 2014-07-02 MED ORDER — SODIUM CHLORIDE 0.9 % IJ SOLN
INTRAMUSCULAR | Status: AC
  Filled 2014-07-02: qty 50

## 2014-07-02 MED ORDER — POLYETHYLENE GLYCOL 3350 17 G PO PACK
17.0000 g | PACK | Freq: Two times a day (BID) | ORAL | Status: DC
  Administered 2014-07-03: 17 g via ORAL

## 2014-07-02 MED ORDER — HYDROMORPHONE HCL 1 MG/ML IJ SOLN
INTRAMUSCULAR | Status: DC | PRN
  Administered 2014-07-02 (×2): 1 mg via INTRAVENOUS

## 2014-07-02 MED ORDER — CHLORHEXIDINE GLUCONATE 4 % EX LIQD: 60.0000 mL | Freq: Once | CUTANEOUS | Status: DC

## 2014-07-02 MED ORDER — ESMOLOL HCL 10 MG/ML IV SOLN
INTRAVENOUS | Status: DC | PRN
  Administered 2014-07-02 (×2): 20 mg via INTRAVENOUS

## 2014-07-02 MED ORDER — FENTANYL CITRATE 0.05 MG/ML IJ SOLN
INTRAMUSCULAR | Status: AC
  Filled 2014-07-02: qty 5

## 2014-07-02 MED ORDER — ROCURONIUM BROMIDE 100 MG/10ML IV SOLN
INTRAVENOUS | Status: AC
  Filled 2014-07-02: qty 1

## 2014-07-02 MED ORDER — SODIUM CHLORIDE 0.9 % IJ SOLN: INTRAMUSCULAR | Status: DC | PRN

## 2014-07-02 MED ORDER — LOSARTAN POTASSIUM-HCTZ 100-12.5 MG PO TABS: 1.0000 | ORAL_TABLET | Freq: Every day | ORAL | Status: DC

## 2014-07-02 MED ORDER — ESMOLOL HCL 10 MG/ML IV SOLN
INTRAVENOUS | Status: AC
  Filled 2014-07-02: qty 10

## 2014-07-02 MED ORDER — LOSARTAN POTASSIUM 50 MG PO TABS
100.0000 mg | ORAL_TABLET | Freq: Every day | ORAL | Status: DC
  Administered 2014-07-03: 100 mg via ORAL
  Filled 2014-07-02: qty 2

## 2014-07-02 MED ORDER — BISACODYL 10 MG RE SUPP: 10.0000 mg | Freq: Every day | RECTAL | Status: DC | PRN

## 2014-07-02 MED ORDER — HYDROMORPHONE HCL 2 MG/ML IJ SOLN
INTRAMUSCULAR | Status: AC
  Filled 2014-07-02: qty 1

## 2014-07-02 MED ORDER — FERROUS SULFATE 325 (65 FE) MG PO TABS
325.0000 mg | ORAL_TABLET | Freq: Three times a day (TID) | ORAL | Status: DC
  Administered 2014-07-03 (×2): 325 mg via ORAL
  Filled 2014-07-02 (×4): qty 1

## 2014-07-02 MED ORDER — PROPOFOL 10 MG/ML IV BOLUS
INTRAVENOUS | Status: DC | PRN
  Administered 2014-07-02: 200 mg via INTRAVENOUS

## 2014-07-02 MED ORDER — SODIUM CHLORIDE 0.9 % IV SOLN
INTRAVENOUS | Status: DC
  Administered 2014-07-02: 23:00:00 via INTRAVENOUS
  Filled 2014-07-02 (×4): qty 1000

## 2014-07-02 MED ORDER — METHOCARBAMOL 1000 MG/10ML IJ SOLN
500.0000 mg | Freq: Four times a day (QID) | INTRAVENOUS | Status: DC | PRN
  Administered 2014-07-02 – 2014-07-03 (×2): 500 mg via INTRAVENOUS
  Filled 2014-07-02 (×2): qty 5

## 2014-07-02 MED ORDER — ROCURONIUM BROMIDE 100 MG/10ML IV SOLN
INTRAVENOUS | Status: DC | PRN
  Administered 2014-07-02: 20 mg via INTRAVENOUS

## 2014-07-02 MED ORDER — HYDROMORPHONE HCL 1 MG/ML IJ SOLN
0.2500 mg | INTRAMUSCULAR | Status: DC | PRN
  Administered 2014-07-02 (×2): 0.5 mg via INTRAVENOUS

## 2014-07-02 MED ORDER — BUPIVACAINE-EPINEPHRINE (PF) 0.25% -1:200000 IJ SOLN
INTRAMUSCULAR | Status: AC
  Filled 2014-07-02: qty 30

## 2014-07-02 MED ORDER — GLYCOPYRROLATE 0.2 MG/ML IJ SOLN
INTRAMUSCULAR | Status: DC | PRN
  Administered 2014-07-02: 0.4 mg via INTRAVENOUS

## 2014-07-02 MED ORDER — NEOSTIGMINE METHYLSULFATE 10 MG/10ML IV SOLN
INTRAVENOUS | Status: AC
  Filled 2014-07-02: qty 1

## 2014-07-02 MED ORDER — NEOSTIGMINE METHYLSULFATE 10 MG/10ML IV SOLN
INTRAVENOUS | Status: DC | PRN
  Administered 2014-07-02: 3 mg via INTRAVENOUS

## 2014-07-02 MED ORDER — KETOROLAC TROMETHAMINE 30 MG/ML IJ SOLN
INTRAMUSCULAR | Status: AC
  Filled 2014-07-02: qty 1

## 2014-07-02 MED ORDER — SUCCINYLCHOLINE CHLORIDE 20 MG/ML IJ SOLN
INTRAMUSCULAR | Status: DC | PRN
  Administered 2014-07-02: 100 mg via INTRAVENOUS

## 2014-07-02 MED ORDER — FLUTICASONE PROPIONATE 50 MCG/ACT NA SUSP
1.0000 | Freq: Every day | NASAL | Status: DC
  Administered 2014-07-03: 1 via NASAL
  Filled 2014-07-02: qty 16

## 2014-07-02 MED ORDER — KETOROLAC TROMETHAMINE 30 MG/ML IJ SOLN
INTRAMUSCULAR | Status: DC | PRN
  Administered 2014-07-02: 30 mg via INTRAVENOUS

## 2014-07-02 MED ORDER — MAGNESIUM CITRATE PO SOLN: 1.0000 | Freq: Once | ORAL | Status: AC | PRN

## 2014-07-02 MED ORDER — BUPIVACAINE-EPINEPHRINE 0.25% -1:200000 IJ SOLN
INTRAMUSCULAR | Status: DC | PRN
  Administered 2014-07-02: 30 mL

## 2014-07-02 MED ORDER — DIPHENHYDRAMINE HCL 25 MG PO CAPS: 25.0000 mg | ORAL_CAPSULE | Freq: Four times a day (QID) | ORAL | Status: DC | PRN

## 2014-07-02 MED ORDER — POLYVINYL ALCOHOL 1.4 % OP SOLN: 1.0000 [drp] | Freq: Every day | OPHTHALMIC | Status: DC | PRN

## 2014-07-02 MED ORDER — VERAPAMIL HCL ER 240 MG PO TBCR
240.0000 mg | EXTENDED_RELEASE_TABLET | Freq: Two times a day (BID) | ORAL | Status: DC
  Administered 2014-07-02 – 2014-07-03 (×2): 240 mg via ORAL
  Filled 2014-07-02 (×3): qty 1

## 2014-07-02 MED ORDER — ONDANSETRON HCL 4 MG PO TABS: 4.0000 mg | ORAL_TABLET | Freq: Four times a day (QID) | ORAL | Status: DC | PRN

## 2014-07-02 MED ORDER — CEFAZOLIN SODIUM-DEXTROSE 2-3 GM-% IV SOLR
2.0000 g | Freq: Four times a day (QID) | INTRAVENOUS | Status: AC
  Administered 2014-07-02 – 2014-07-03 (×2): 2 g via INTRAVENOUS
  Filled 2014-07-02 (×2): qty 50

## 2014-07-02 MED ORDER — HYDROCHLOROTHIAZIDE 12.5 MG PO CAPS
12.5000 mg | ORAL_CAPSULE | Freq: Every day | ORAL | Status: DC
  Administered 2014-07-03: 12.5 mg via ORAL
  Filled 2014-07-02: qty 1

## 2014-07-02 MED ORDER — DEXAMETHASONE SODIUM PHOSPHATE 10 MG/ML IJ SOLN
INTRAMUSCULAR | Status: AC
  Filled 2014-07-02: qty 1

## 2014-07-02 MED ORDER — ALUM & MAG HYDROXIDE-SIMETH 200-200-20 MG/5ML PO SUSP
30.0000 mL | ORAL | Status: DC | PRN
  Administered 2014-07-03: 30 mL via ORAL
  Filled 2014-07-02: qty 30

## 2014-07-02 MED ORDER — METOCLOPRAMIDE HCL 5 MG/ML IJ SOLN: 5.0000 mg | Freq: Three times a day (TID) | INTRAMUSCULAR | Status: DC | PRN

## 2014-07-02 MED ORDER — HYDROMORPHONE HCL 1 MG/ML IJ SOLN
INTRAMUSCULAR | Status: AC
  Filled 2014-07-02: qty 1

## 2014-07-02 MED ORDER — CEFAZOLIN SODIUM-DEXTROSE 2-3 GM-% IV SOLR
INTRAVENOUS | Status: AC
  Filled 2014-07-02: qty 50

## 2014-07-02 MED ORDER — LIDOCAINE HCL (CARDIAC) 20 MG/ML IV SOLN
INTRAVENOUS | Status: AC
  Filled 2014-07-02: qty 5

## 2014-07-02 MED ORDER — PHENOL 1.4 % MT LIQD: 1.0000 | OROMUCOSAL | Status: DC | PRN

## 2014-07-02 MED ORDER — ALPRAZOLAM 0.25 MG PO TABS: 0.2500 mg | ORAL_TABLET | Freq: Two times a day (BID) | ORAL | Status: DC | PRN

## 2014-07-02 MED ORDER — ONDANSETRON HCL 4 MG/2ML IJ SOLN
INTRAMUSCULAR | Status: DC | PRN
  Administered 2014-07-02: 4 mg via INTRAVENOUS

## 2014-07-02 MED ORDER — HYDROCODONE-ACETAMINOPHEN 7.5-325 MG PO TABS
1.0000 | ORAL_TABLET | ORAL | Status: DC
  Administered 2014-07-03 (×3): 2 via ORAL
  Filled 2014-07-02 (×3): qty 2

## 2014-07-02 MED ORDER — MENTHOL 3 MG MT LOZG: 1.0000 | LOZENGE | OROMUCOSAL | Status: DC | PRN

## 2014-07-02 MED ORDER — METOCLOPRAMIDE HCL 10 MG PO TABS: 5.0000 mg | ORAL_TABLET | Freq: Three times a day (TID) | ORAL | Status: DC | PRN

## 2014-07-02 MED ORDER — CEFAZOLIN SODIUM-DEXTROSE 2-3 GM-% IV SOLR
2.0000 g | Freq: Once | INTRAVENOUS | Status: AC
  Administered 2014-07-02: 2 g via INTRAVENOUS

## 2014-07-02 MED ORDER — LIDOCAINE HCL (CARDIAC) 20 MG/ML IV SOLN
INTRAVENOUS | Status: DC | PRN
  Administered 2014-07-02: 100 mg via INTRAVENOUS

## 2014-07-02 MED ORDER — PROMETHAZINE HCL 25 MG/ML IJ SOLN: 6.2500 mg | INTRAMUSCULAR | Status: DC | PRN

## 2014-07-02 MED ORDER — TRANEXAMIC ACID 100 MG/ML IV SOLN
1000.0000 mg | Freq: Once | INTRAVENOUS | Status: AC
  Administered 2014-07-02: 1000 mg via INTRAVENOUS
  Filled 2014-07-02: qty 10

## 2014-07-02 MED ORDER — LACTATED RINGERS IV SOLN
INTRAVENOUS | Status: DC | PRN
  Administered 2014-07-02 (×3): via INTRAVENOUS

## 2014-07-02 MED ORDER — ASPIRIN EC 325 MG PO TBEC
325.0000 mg | DELAYED_RELEASE_TABLET | Freq: Two times a day (BID) | ORAL | Status: DC
  Administered 2014-07-03: 325 mg via ORAL
  Filled 2014-07-02 (×3): qty 1

## 2014-07-02 MED ORDER — METHOCARBAMOL 500 MG PO TABS: 500.0000 mg | ORAL_TABLET | Freq: Four times a day (QID) | ORAL | Status: DC | PRN

## 2014-07-02 MED ORDER — HYDROMORPHONE HCL 1 MG/ML IJ SOLN
0.5000 mg | INTRAMUSCULAR | Status: DC | PRN
  Administered 2014-07-03: 0.5 mg via INTRAVENOUS
  Filled 2014-07-02: qty 1

## 2014-07-02 MED ORDER — DEXAMETHASONE SODIUM PHOSPHATE 10 MG/ML IJ SOLN
INTRAMUSCULAR | Status: DC | PRN
  Administered 2014-07-02: 10 mg via INTRAVENOUS

## 2014-07-02 MED ORDER — PROPOFOL INFUSION 10 MG/ML OPTIME
INTRAVENOUS | Status: DC | PRN
  Administered 2014-07-02: 140 ug/kg/min via INTRAVENOUS

## 2014-07-02 SURGICAL SUPPLY — 51 items
BAG ZIPLOCK 12X15 (MISCELLANEOUS) IMPLANT
BANDAGE ELASTIC 6 VELCRO ST LF (GAUZE/BANDAGES/DRESSINGS) IMPLANT
BANDAGE ESMARK 6X9 LF (GAUZE/BANDAGES/DRESSINGS) ×1 IMPLANT
BLADE SAW RECIPROCATING 77.5 (BLADE) ×3 IMPLANT
BLADE SAW SGTL 13.0X1.19X90.0M (BLADE) ×3 IMPLANT
BNDG ELASTIC 6X10 VLCR STRL LF (GAUZE/BANDAGES/DRESSINGS) ×3 IMPLANT
BNDG ESMARK 6X9 LF (GAUZE/BANDAGES/DRESSINGS) ×3
BOWL SMART MIX CTS (DISPOSABLE) ×3 IMPLANT
CAPT KNEE OXFORD ×3 IMPLANT
CEMENT HV SMART SET (Cement) ×3 IMPLANT
CUFF TOURN SGL QUICK 34 (TOURNIQUET CUFF) ×2
CUFF TRNQT CYL 34X4X40X1 (TOURNIQUET CUFF) ×1 IMPLANT
DERMABOND ADVANCED (GAUZE/BANDAGES/DRESSINGS) ×2
DERMABOND ADVANCED .7 DNX12 (GAUZE/BANDAGES/DRESSINGS) ×1 IMPLANT
DRAPE EXTREMITY TIBURON (DRAPES) ×3 IMPLANT
DRAPE POUCH INSTRU U-SHP 10X18 (DRAPES) ×3 IMPLANT
DRAPE U-SHAPE 47X51 STRL (DRAPES) ×3 IMPLANT
DRSG AQUACEL AG ADV 3.5X10 (GAUZE/BANDAGES/DRESSINGS) ×3 IMPLANT
DRSG TEGADERM 4X4.75 (GAUZE/BANDAGES/DRESSINGS) IMPLANT
DURAPREP 26ML APPLICATOR (WOUND CARE) ×6 IMPLANT
ELECT REM PT RETURN 9FT ADLT (ELECTROSURGICAL) ×3
ELECTRODE REM PT RTRN 9FT ADLT (ELECTROSURGICAL) ×1 IMPLANT
EVACUATOR 1/8 PVC DRAIN (DRAIN) IMPLANT
FACESHIELD WRAPAROUND (MASK) ×12 IMPLANT
GAUZE SPONGE 2X2 8PLY STRL LF (GAUZE/BANDAGES/DRESSINGS) IMPLANT
GLOVE BIOGEL PI IND STRL 7.5 (GLOVE) ×1 IMPLANT
GLOVE BIOGEL PI IND STRL 8.5 (GLOVE) ×1 IMPLANT
GLOVE BIOGEL PI INDICATOR 7.5 (GLOVE) ×2
GLOVE BIOGEL PI INDICATOR 8.5 (GLOVE) ×2
GLOVE ECLIPSE 8.0 STRL XLNG CF (GLOVE) ×3 IMPLANT
GLOVE ORTHO TXT STRL SZ7.5 (GLOVE) ×6 IMPLANT
GOWN SPEC L3 XXLG W/TWL (GOWN DISPOSABLE) ×3 IMPLANT
GOWN STRL REUS W/TWL LRG LVL3 (GOWN DISPOSABLE) ×3 IMPLANT
KIT BASIN OR (CUSTOM PROCEDURE TRAY) ×3 IMPLANT
LEGGING LITHOTOMY PAIR STRL (DRAPES) ×3 IMPLANT
LIQUID BAND (GAUZE/BANDAGES/DRESSINGS) ×3 IMPLANT
MANIFOLD NEPTUNE II (INSTRUMENTS) ×3 IMPLANT
NDL SAFETY ECLIPSE 18X1.5 (NEEDLE) ×1 IMPLANT
NEEDLE HYPO 18GX1.5 SHARP (NEEDLE) ×2
PACK TOTAL JOINT (CUSTOM PROCEDURE TRAY) ×3 IMPLANT
SPONGE GAUZE 2X2 STER 10/PKG (GAUZE/BANDAGES/DRESSINGS)
SUCTION FRAZIER TIP 10 FR DISP (SUCTIONS) ×3 IMPLANT
SUT MNCRL AB 4-0 PS2 18 (SUTURE) ×3 IMPLANT
SUT VIC AB 1 CT1 36 (SUTURE) ×3 IMPLANT
SUT VIC AB 2-0 CT1 27 (SUTURE) ×4
SUT VIC AB 2-0 CT1 TAPERPNT 27 (SUTURE) ×2 IMPLANT
SUT VLOC 180 0 24IN GS25 (SUTURE) ×3 IMPLANT
SYR 50ML LL SCALE MARK (SYRINGE) ×3 IMPLANT
TOWEL OR 17X26 10 PK STRL BLUE (TOWEL DISPOSABLE) ×3 IMPLANT
TOWEL OR NON WOVEN STRL DISP B (DISPOSABLE) IMPLANT
TRAY FOLEY CATH 14FRSI W/METER (CATHETERS) ×3 IMPLANT

## 2014-07-02 NOTE — Op Note (Signed)
NAME: [name]    MEDICAL RECORD NO.: [id number]   FACILITY: [facility] OF BIRTH: 1941-07-29  PHYSICIAN: Pietro Cassis. Alvan Dame, M.D.    DATE OF PROCEDURE: 07/02/2014    OPERATIVE REPORT   PREOPERATIVE DIAGNOSIS: Right knee medial compartment osteoarthritis.   POSTOPERATIVE DIAGNOSIS: Right knee medial compartment osteoarthritis.  PROCEDURE: Right partial knee replacement utilizing Biomet Oxford knee  component, size small femur, a right medial size AA tibial tray with a size 5 mm insert.   SURGEON: Pietro Cassis. Alvan Dame, M.D.   ASSISTANT: Danae Orleans, PAC.  Please note that Mr. Guinevere Scarlet was present for the entirety of the case,  utilized for preoperative positioning, perioperative retractor  management, general facilitation of the case and primary wound closure.   ANESTHESIA: general propofol gtt.   SPECIMENS: None.   COMPLICATIONS: None.  DRAINS: 1 medium HV   TOURNIQUET TIME: 31 minutes at 250 mmHg.   INDICATIONS FOR PROCEDURE: The patient is a 73 yo female who had presented for evaluation of right knee pain.  They presented with primary complaints of pain on the medial side of their knee. Radiographs revealed advanced medial compartment arthritis with specifically an antero-medial wear pattern.  There was bone on bone changes noted with subchondral sclerosis and osteophytes present. The patient has had progressive problems failing to respond to conservative measures of medications, injections and activity modification. Risks of infection, DVT, component failure, need for future revision surgery were all discussed and reviewed.  Consent was obtained for benefit of pain relief.   PROCEDURE IN DETAIL: The patient was brought to the operative theater.  Once adequate anesthesia, preoperative antibiotics, 2gm of Ancef administered, the patient was positioned in supine position with a right thigh tourniquet  placed. The right lower extremity was prepped and draped in sterile   fashion with the leg on the Oxford leg holder.  The leg was allowed to flex to 120 degrees. A time-out  was performed identifying the patient, planned procedure, and extremity.  The leg was exsanguinated, tourniquet elevated to 250 mmHg. A midline  incision was made from the proximal pole of the patella to the tibial tubercle. A  soft tissue plane was created and partial median arthrotomy was then  made to allow for subluxation of the patella. Following initial synovectomy and  debridement, the osteophytes were removed off the medial aspect of the  knee.   Attention was first directed to the tibia. The tibial  extramedullary guide was positioned over the anterior crest of the tibia  and pinned into position, and using a measured resection guide from the  [place] system, a 4 mm resection was made off the proximal tibia. First  the reciprocating saw along the medial aspect of the tibial spines, then the oscillating saw.    At this point, I sized this cut surface seem to be best fit for a size AA tibial tray.  With the retractors out of the wound and the knee held at 90 degrees the 5 feeler gauge had appropriate tension on the medial ligament.   At this point, the femoral canal was opened with a drill and the  intramedullary rod passed. Then using the guide to set the drill holes for the small posterior cutting block was positioned over the mid portion of the medial femoral condyle.  The orientation was set using the guide that mates the femoral guide to the intramedullary rod.  The 2 drill holes were made into the distal femur.  The posterior  guide was then impacted into place and the posterior  femoral cut made.  At this point, I milled the distal femur with a size 5 spigot in place. At this point, we did a trial reduction of the small femur, size AA tibial tray and a size 5 feeler gauge. At 90 degrees of  flexion and at 20 degrees of flexion the knee had symmetric tension on  the  ligaments.  Given these findings, the trial femoral component was removed. Final preparation of tibia was carried out by pinning it in position. Then  using a reciprocating saw I removed bone for the keel. Further bone was  removed with an osteotome.  Trial reduction was now carried out with the small femur, the right medial AA tibia, and a size 5 lollipop insert. The balance of the  ligaments appeared to be symmetric at 20 degrees and 90 degrees. Given  all these findings, the trial components were removed.   Cement was mixed. The final components were opened. The knee was irrigated with  normal saline solution. Then final debridements of the  soft tissue was carried out, I also drilled the sclerotic bone with a drill.  The final components were cemented with a single batch of cement in a  two-stage technique with the tibial component cemented first. The knee  was then brought  to 45 degrees of flexion with a 5 feeler gauge, held with pressure for a minute and half.  After this the femoral component was cemented in place.  The knee was again held at 45 degrees of flexion while the cement fully cured.  Excess cement was removed throughout the knee. Tourniquet was let down  after 31 minutes. The medial synovial capsular tissue was injected with 30cc of Marcaine with epi and 1cc of Toradol.  After the cement had fully cured and excessive cement  was removed throughout the knee there was no visualized cement present.   The final size 5 mm insert to match the small femurwas chosen and snapped into position. We re-irrigated  the knee. The extensor mechanism  was then reapproximated using a #1 Vicryl and #0 V-lock sutures with the knee in flexion. The  remaining wound was closed with 2-0 Vicryl and a running 4-0 Monocryl.  The knee was cleaned, dried, and dressed sterilely using Dermabond and  Aquacel dressing. The patient  was brought to the recovery room, Ace wrap in place, tolerating the   procedure well. He will be in the hospital for overnight observation.  We will initiate physical therapy and progress to ambulate.     Pietro Cassis Alvan Dame, M.D.

## 2014-07-02 NOTE — H&P (View-Only) (Signed)
UNICOMPARTMENTAL KNEE ADMISSION H&P  Patient is being admitted for right medial unicompartmental knee arthroplasty.  Subjective:  Chief Complaint:  Right knee medial compartment OA / pain.  HPI: [name], 73 y.o. female, has a history of pain and functional disability in the right knee due to trauma and has failed non-surgical conservative treatments for greater than 12 weeks to include NSAID's and/or analgesics, corticosteriod injections, viscosupplementation injections, use of assistive devices and activity modification.  Onset of symptoms was gradual, starting 2+ years ago with gradually worsening course since that time. The patient noted no past surgery on the right knee(s).  Patient currently rates pain in the right knee(s) at 10 out of 10 with activity. Patient has worsening of pain with activity and weight bearing, pain that interferes with activities of daily living, pain with passive range of motion, crepitus and joint swelling.  Patient has evidence of periarticular osteophytes and joint space narrowing of the medial compartment by imaging studies. There is no active infection.  Risks, benefits and expectations were discussed with the patient.  Risks including but not limited to the risk of anesthesia, blood clots, nerve damage, blood vessel damage, failure of the prosthesis, infection and up to and including death.  Patient understand the risks, benefits and expectations and wishes to proceed with surgery.   PCP: Olga Millers, MD  D/C Plans:      Home with HHPT  Post-op Meds:       No Rx given   Tranexamic Acid:      To be given - IV    Decadron:      Is to be given  FYI:     ASA post-op  Norco post-op    Patient Active Problem List   Diagnosis Date Noted  . Obesity (BMI 30-39.9) 02/12/2014  . SPONDYLOSIS, LUMBAR, WITH RADICULOPATHY 05/26/2010  . HIP PAIN, LEFT 05/01/2010  . NONSPEC REACT TUBERCULIN SKN TEST W/O ACTV TB 10/19/2009  . [place] DISEASE,  LUMBOSACRAL SPINE 09/07/2008  . HYPERLIPIDEMIA 04/17/2007  . DEPRESSION 04/17/2007  . HYPERTENSION 04/17/2007  . ALLERGIC RHINITIS 04/17/2007  . CYST/PSEUDOCYST, PANCREAS 04/17/2007  . OSTEOARTHRITIS 04/17/2007  . GASTRIC POLYP, HX OF 04/17/2007   Past Medical History  Diagnosis Date  . Allergy   . Depression   . Hyperlipidemia   . Hypertension   . Anxiety   . Diverticulosis     PER COLONOSCOPY  . Heart murmur     "VERY SLIGHT ALL MY LIFE"  . Sleep apnea     does not tolerate CPAP - HAS NOT USED IN OVER 2 YRS  . GERD (gastroesophageal reflux disease)     OVER THE COUNTER MEDS IF NEEDED  . Arthritis     PAIN AND OA RIGHT KNEE; PAIN AND DDD AND SPINAL STENOSIS  . Complication of anesthesia     PULLED OUT MY IV'S AND FOLEY - HALLUCINATIONS WITH HIATAL HERNIA SURGERY    Past Surgical History  Procedure Laterality Date  . Lumbar laminectomy    . Lumbar fusion    . Hiatal hernia repair  2008  . Upper gastrointestinal endoscopy    . Colonoscopy      No prescriptions prior to admission   Allergies  Allergen Reactions  . Ambien [Zolpidem Tartrate]     amnesia  . Prednisone     Rapid heart rate  . Thiazide-Type Diuretics Other (See Comments)    "could not physically move"    History  Substance Use Topics  .  Smoking status: Never Smoker   . Smokeless tobacco: Never Used  . Alcohol Use: No    Family History  Problem Relation Age of Onset  . Heart disease Mother   . Heart disease Father   . Diabetes Paternal Grandfather      Review of Systems  Constitutional: Negative.   HENT: Negative.   Eyes: Negative.   Respiratory: Negative.   Cardiovascular: Negative.   Gastrointestinal: Positive for heartburn.  Genitourinary: Positive for frequency.  Musculoskeletal: Positive for back pain and joint pain.  Skin: Negative.   Neurological: Negative.   Endo/Heme/Allergies: Positive for environmental allergies.  Psychiatric/Behavioral: Positive for depression.     Objective:  Physical Exam  Constitutional: She is oriented to person, place, and time. She appears well-developed and well-nourished.  HENT:  Head: Normocephalic and atraumatic.  Eyes: Pupils are equal, round, and reactive to light.  Neck: Neck supple. No JVD present. No tracheal deviation present. No thyromegaly present.  Cardiovascular: Normal rate, regular rhythm and intact distal pulses.   Murmur heard. Respiratory: Effort normal and breath sounds normal. No stridor.  GI: Soft. There is no tenderness. There is no guarding.  Musculoskeletal:       Right knee: She exhibits decreased range of motion, swelling and bony tenderness. She exhibits no ecchymosis, no deformity, no laceration and no erythema. Tenderness found. Medial joint line tenderness noted. No lateral joint line tenderness noted.  Lymphadenopathy:    She has no cervical adenopathy.  Neurological: She is alert and oriented to person, place, and time.  Skin: Skin is warm and dry.  Psychiatric: She has a normal mood and affect.      Labs:  Estimated body mass index is 35.80 kg/(m^2) as calculated from the following:   Height as of 02/12/14: 5' 2.5" (1.588 m).   Weight as of 02/12/14: 90.266 kg (199 lb).   Imaging Review Plain radiographs demonstrate severe degenerative joint disease of the right knee, medial compartment. The overall alignment is neutral. The bone quality appears to be good for age and reported activity level.  Assessment/Plan:  End stage arthritis, right knee, medial compartment  The patient history, physical examination, clinical judgment of the provider and imaging studies are consistent with end stage degenerative joint disease of the right knee(s) and medial unicompartmental knee arthroplasty is deemed medically necessary. The treatment options including medical management, injection therapy arthroscopy and arthroplasty were discussed at length. The risks and benefits of total knee  arthroplasty were presented and reviewed. The risks due to aseptic loosening, infection, stiffness, patella tracking problems, thromboembolic complications and other imponderables were discussed. The patient acknowledged the explanation, agreed to proceed with the plan and consent was signed. Patient is being admitted for outpatient / observation treatment for surgery, pain control, PT, OT, prophylactic antibiotics, VTE prophylaxis, progressive ambulation and ADL's and discharge planning. The patient is planning to be discharged home with home health services.    West Pugh Izyk Marty   PA-C  06/25/2014, 4:33 PM

## 2014-07-02 NOTE — Interval H&P Note (Signed)
History and Physical Interval Note:  07/02/2014 4:12 PM  [name]  has presented today for surgery, with the diagnosis of right knee medial compartmental osteoarthritis  The various methods of treatment have been discussed with the patient and family. After consideration of risks, benefits and other options for treatment, the patient has consented to  Procedure(s): RIGHT MEDIAL UNICOMPARTMENTAL KNEE (Right) as a surgical intervention .  The patient's history has been reviewed, patient examined, no change in status, stable for surgery.  I have reviewed the patient's chart and labs.  Questions were answered to the patient's satisfaction.     Mauri Pole

## 2014-07-02 NOTE — Anesthesia Postprocedure Evaluation (Signed)
  Anesthesia Post-op Note  Patient: [name]  Procedure(s) Performed: Procedure(s) (LRB): RIGHT MEDIAL UNICOMPARTMENTAL KNEE (Right)  Patient Location: PACU  Anesthesia Type: General  Level of Consciousness: awake and alert   Airway and Oxygen Therapy: Patient Spontanous Breathing  Post-op Pain: mild  Post-op Assessment: Post-op Vital signs reviewed, Patient's Cardiovascular Status Stable, Respiratory Function Stable, Patent Airway and No signs of Nausea or vomiting  Last Vitals:  Filed Vitals:   07/02/14 1957  BP: 150/68  Pulse: 87  Temp: 36.7 C  Resp: 9    Post-op Vital Signs: stable   Complications: No apparent anesthesia complications

## 2014-07-02 NOTE — Transfer of Care (Signed)
Immediate Anesthesia Transfer of Care Note  Patient: [name]  Procedure(s) Performed: Procedure(s) (LRB): RIGHT MEDIAL UNICOMPARTMENTAL KNEE (Right)  Patient Location: PACU  Anesthesia Type: General  Level of Consciousness: sedated, patient cooperative and responds to stimulation  Airway & Oxygen Therapy: Patient Spontanous Breathing and Patient connected to face mask oxgen  Post-op Assessment: Report given to PACU RN and Post -op Vital signs reviewed and stable  Post vital signs: Reviewed and stable  Complications: No apparent anesthesia complications

## 2014-07-03 DIAGNOSIS — M179 Osteoarthritis of knee, unspecified: Secondary | ICD-10-CM | POA: Diagnosis not present

## 2014-07-03 LAB — CBC
HCT: 37.3 % (ref 36.0–46.0)
HEMOGLOBIN: 12.5 g/dL (ref 12.0–15.0)
MCH: 30.9 pg (ref 26.0–34.0)
MCHC: 33.5 g/dL (ref 30.0–36.0)
MCV: 92.1 fL (ref 78.0–100.0)
Platelets: 283 10*3/uL (ref 150–400)
RBC: 4.05 MIL/uL (ref 3.87–5.11)
RDW: 12.8 % (ref 11.5–15.5)
WBC: 10.8 10*3/uL — AB (ref 4.0–10.5)

## 2014-07-03 LAB — BASIC METABOLIC PANEL
Anion gap: 14 (ref 5–15)
BUN: 13 mg/dL (ref 6–23)
CHLORIDE: 98 meq/L (ref 96–112)
CO2: 23 mEq/L (ref 19–32)
Calcium: 9 mg/dL (ref 8.4–10.5)
Creatinine, Ser: 0.54 mg/dL (ref 0.50–1.10)
GFR calc Af Amer: >90 mL/min (ref >90)
GFR calc non Af Amer: >90 mL/min (ref >90)
GLUCOSE: 167 mg/dL — AB (ref 70–99)
Potassium: 4.5 mEq/L (ref 3.7–5.3)
SODIUM: 135 meq/L — AB (ref 137–147)

## 2014-07-03 MED ORDER — ASPIRIN 325 MG PO TBEC: 325.0000 mg | DELAYED_RELEASE_TABLET | Freq: Two times a day (BID) | ORAL | Status: DC

## 2014-07-03 MED ORDER — HYDROCODONE-ACETAMINOPHEN 7.5-325 MG PO TABS: 1.0000 | ORAL_TABLET | ORAL | Status: DC

## 2014-07-03 MED ORDER — METHOCARBAMOL 500 MG PO TABS: 500.0000 mg | ORAL_TABLET | Freq: Four times a day (QID) | ORAL | Status: DC | PRN

## 2014-07-03 NOTE — Progress Notes (Signed)
NCM ordered DME with [facility]. Jonnie Finner RN CCM Case Mgmt [phone]

## 2014-07-03 NOTE — Evaluation (Signed)
Occupational Therapy Evaluation Patient Details Name: [name] MRN: [id number] DOB: [birth year] Today's Date: 07/03/2014    History of Present Illness R UKR   Clinical Impression   Pt is s/p UKR . OT provided education to increase  safety and independence with ADL and functional mobility for ADL  activity    Follow Up Recommendations  No OT follow up          Precautions / Restrictions Precautions Precautions: Knee;Fall      Mobility Bed Mobility Overal bed mobility: Needs Assistance Bed Mobility: Sit to Supine       Sit to supine: Supervision   General bed mobility comments: lifts leg onto bed without assist  Transfers Overall transfer level: Needs assistance Equipment used: Rolling walker (2 wheeled) Transfers: Sit to/from Stand Sit to Stand: Supervision         General transfer comment: cues for sequence and hand placement, R leg position    Balance                                            ADL Overall ADL's : Needs assistance/impaired                     Lower Body Dressing: Minimal assistance;Sit to/from stand   Toilet Transfer: Supervision/safety   Toileting- Water quality scientist and Hygiene: Supervision/safety;Sit to/from stand       Functional mobility during ADLs: Min guard General ADL Comments: husband will A as needed.  VC for safety               Pertinent Vitals/Pain Pain Assessment: 0-10 Pain Score: 2  Pain Location: sore Pain Descriptors / Indicators: Sore Pain Intervention(s): Patient requesting pain meds-RN notified     Hand Dominance     Extremity/Trunk Assessment Upper Extremity Assessment Upper Extremity Assessment: Overall WFL for tasks assessed   Lower Extremity Assessment Lower Extremity Assessment: RLE deficits/detail RLE Deficits / Details: performs SLR, knee flexion 80       Communication Communication Communication: No difficulties   Cognition Arousal/Alertness:  Awake/alert Behavior During Therapy: WFL for tasks assessed/performed Overall Cognitive Status: Within Functional Limits for tasks assessed                                Home Living Family/patient expects to be discharged to:: Private residence Living Arrangements: Spouse/significant other Available Help at Discharge: Family Type of Home: House Home Access: Stairs to enter Technical brewer of Steps: 4 Entrance Stairs-Rails: Right;Left Home Layout: One level               Home Equipment: Toilet riser          Prior Functioning/Environment Level of Independence: Independent             OT Diagnosis: Generalized weakness         OT Goals(Current goals can be found in the care plan section) Acute Rehab OT Goals Patient Stated Goal: to go back to work in 1 week  OT Frequency:     Barriers to D/C:               End of Session Nurse Communication: Mobility status  Activity Tolerance: Patient tolerated treatment well Patient left: in bed;with family/visitor present;with call bell/phone within reach   Time: 1035-1110 OT  Time Calculation (min): 35 min Charges:  OT General Charges $OT Visit: 1 Procedure OT Evaluation $Initial OT Evaluation Tier I: 1 Procedure OT Treatments $Self Care/Home Management : 23-37 mins G-Codes:    Payton Mccallum D 21-Jul-2014, 11:10 AM

## 2014-07-03 NOTE — Evaluation (Signed)
Physical Therapy Evaluation Patient Details Name: [name] MRN: [id number] DOB: 12-20-1940 Today's Date: 07/03/2014   History of Present Illness  R UKR  Clinical Impression  Tolerated short ambulation, feels very tired. Plans Dc today after next session/steps. Pt will benefit from PT to address problems listed in note below.    Follow Up Recommendations Home health PT;Supervision/Assistance - 24 hour    Equipment Recommendations  Rolling walker with 5" wheels    Recommendations for Other Services       Precautions / Restrictions Precautions Precautions: Knee;Fall      Mobility  Bed Mobility Overal bed mobility: Needs Assistance Bed Mobility: Sit to Supine       Sit to supine: Supervision   General bed mobility comments: lifts leg onto bed without assist  Transfers Overall transfer level: Needs assistance Equipment used: Rolling walker (2 wheeled) Transfers: Sit to/from Stand Sit to Stand: Min assist         General transfer comment: cues for sequence and hand placement, R leg position  Ambulation/Gait Ambulation/Gait assistance: Min assist Ambulation Distance (Feet): 40 Feet Assistive device: Rolling walker (2 wheeled) Gait Pattern/deviations: Step-to pattern;Antalgic;Decreased step length - right     General Gait Details: cues for sequence  Stairs            Wheelchair Mobility    Modified Rankin (Stroke Patients Only)       Balance                                             Pertinent Vitals/Pain Pain Assessment: 0-10 Pain Score: 1  Pain Descriptors / Indicators: Discomfort Pain Intervention(s): Patient requesting pain meds-RN notified    Home Living Family/patient expects to be discharged to:: Private residence Living Arrangements: Spouse/significant other Available Help at Discharge: Family Type of Home: House Home Access: Stairs to enter Entrance Stairs-Rails: Psychiatric nurse of  Steps: 4 Home Layout: One level Home Equipment: Toilet riser      Prior Function Level of Independence: Independent               Hand Dominance        Extremity/Trunk Assessment               Lower Extremity Assessment: RLE deficits/detail RLE Deficits / Details: performs SLR, knee flexion 80       Communication   Communication: No difficulties  Cognition Arousal/Alertness: Awake/alert Behavior During Therapy: WFL for tasks assessed/performed Overall Cognitive Status: Within Functional Limits for tasks assessed                      General Comments      Exercises Total Joint Exercises Quad Sets: AROM;Both;10 reps;Supine Heel Slides: AROM;10 reps;Supine Straight Leg Raises: AAROM;Right;10 reps;Supine Long Arc Quad: AROM;Right;10 reps;Seated      Assessment/Plan    PT Assessment Patient needs continued PT services  PT Diagnosis Difficulty walking;Acute pain   PT Problem List Decreased strength;Decreased range of motion;Decreased activity tolerance;Decreased mobility;Decreased knowledge of precautions;Decreased safety awareness;Decreased knowledge of use of DME;Pain  PT Treatment Interventions DME instruction;Gait training;Stair training;Functional mobility training;Therapeutic activities;Therapeutic exercise;Patient/family education   PT Goals (Current goals can be found in the Care Plan section) Acute Rehab PT Goals Patient Stated Goal: to go back to work in 1 week PT Goal Formulation: With patient/family Time For Goal Achievement: 07/04/14 Potential  to Achieve Goals: Good    Frequency 7X/week   Barriers to discharge        Co-evaluation               End of Session   Activity Tolerance: Patient tolerated treatment well Patient left: in bed Nurse Communication: Mobility status    Functional Assessment Tool Used: clinical judgement Functional Limitation: Mobility: Walking and moving around Mobility: Walking and Moving  Around Current Status ([id number]): At least 20 percent but less than 40 percent impaired, limited or restricted Mobility: Walking and Moving Around Goal Status [phone]): At least 1 percent but less than 20 percent impaired, limited or restricted    Time: 0916-0952 PT Time Calculation (min): 36 min   Charges:   PT Evaluation $Initial PT Evaluation Tier I: 1 Procedure PT Treatments $Gait Training: 8-22 mins $Therapeutic Exercise: 8-22 mins   PT G Codes:   Functional Assessment Tool Used: clinical judgement Functional Limitation: Mobility: Walking and moving around    [place] PT [phone]  07/03/2014, 10:10 AM

## 2014-07-03 NOTE — Progress Notes (Signed)
Subjective: 1 Day Post-Op Procedure(s) (LRB): RIGHT MEDIAL UNICOMPARTMENTAL KNEE (Right) Patient reports pain as mild.  She reports that she did not sleep well because of knee discomfort, but over all her pain medications are working well for her. She is eager to go home today. She has not been up with PT yet. She denies any new HA, CP, SOB, N, V, fever, chills, calf swelling or pain. She reports + flatus.   Objective: Vital signs in last 24 hours: Temp:  [97.7 F (36.5 C)-99.2 F (37.3 C)] 97.7 F (36.5 C) (10/31 5809) Pulse Rate:  [83-99] 85 (10/31 0632) Resp:  [9-18] 12 (10/31 0632) BP: (145-173)/(68-94) 145/78 mmHg (10/31 0632) SpO2:  [97 %-100 %] 98 % (10/31 9833) Weight:  [87.771 kg (193 lb 8 oz)] 87.771 kg (193 lb 8 oz) (10/30 1415)  Intake/Output from previous day: 10/30 0701 - 10/31 0700 In: 3628.2 [P.O.:360; I.V.:3058.2; IV Piggyback:210] Out: 1300 [Urine:1250; Blood:50] Intake/Output this shift:     Recent Labs  07/03/14 0400  HGB 12.5    Recent Labs  07/03/14 0400  WBC 10.8*  RBC 4.05  HCT 37.3  PLT 283    Recent Labs  07/03/14 0400  NA 135*  K 4.5  CL 98  CO2 23  BUN 13  CREATININE 0.54  GLUCOSE 167*  CALCIUM 9.0   No results found for this basename: LABPT, INR,  in the last 72 hours  Physical Exam: WD/WN caucasian female in nad. A and O x4. Mood and affect appropriate. EOMI. Respirations normal and unlabored with 2L [place] in place. Abdomen soft and non-tender. Soft dressing to R Knee. Dressings C/D/I. NV intact with brisk capillary refill and 5/5 strength of toe extensors and flexors bilaterally. Distal sensation intact bilaterally. No lymphadenopathy. No calf swelling or palpable cords.    Assessment/Plan: 1 Day Post-Op Procedure(s) (LRB): RIGHT MEDIAL UNICOMPARTMENTAL KNEE (Right) Up with therapy D/C to home after PT PO pain medications for home See D/C summary for other plans per Dr. Clelia Croft HOWELLS 07/03/2014, 7:30  AM [phone]

## 2014-07-03 NOTE — Progress Notes (Signed)
Physical Therapy Treatment Patient Details Name: [name] MRN: [id number] DOB: 11/06/1940 Today's Date: 07/03/2014    History of Present Illness R UKR    PT Comments    Ready for DC.  Follow Up Recommendations  Home health PT;Supervision/Assistance - 24 hour     Equipment Recommendations  Rolling walker with 5" wheels    Recommendations for Other Services       Precautions / Restrictions Precautions Precautions: Knee;Fall    Mobility  Bed Mobility Overal bed mobility: Needs Assistance Bed Mobility: Supine to Sit;Sit to Supine     Supine to sit: Supervision Sit to supine: Supervision   General bed mobility comments: lifts leg onto bed without assist  Transfers Overall transfer level: Needs assistance Equipment used: Rolling walker (2 wheeled) Transfers: Sit to/from Stand Sit to Stand: Supervision         General transfer comment: cues for sequence and hand placement, R leg position  Ambulation/Gait Ambulation/Gait assistance: Min guard Ambulation Distance (Feet): 40 Feet Assistive device: Rolling walker (2 wheeled) Gait Pattern/deviations: Step-through pattern     General Gait Details: cues for sequence   Stairs Stairs: Yes Stairs assistance: Min assist Stair Management: One rail Right;Forwards;With cane Number of Stairs: 2 General stair comments: cues for sequence and safety, daughter present form instruction  Wheelchair Mobility    Modified Rankin (Stroke Patients Only)       Balance                                    Cognition Arousal/Alertness: Awake/alert Behavior During Therapy: WFL for tasks assessed/performed Overall Cognitive Status: Within Functional Limits for tasks assessed                      Exercises Total Joint Exercises Ankle Circles/Pumps: AROM Quad Sets: AROM;Both;10 reps;Supine Short Arc Quad: AROM;Right;10 reps;Supine Heel Slides: AROM;10 reps;Supine Hip ABduction/ADduction:  AROM;Right;10 reps;Supine Straight Leg Raises: AAROM;Right;10 reps;Supine Long Arc Quad: AROM;Right;10 reps;Seated    General Comments        Pertinent Vitals/Pain Pain Assessment: 0-10 Pain Score: 4  Pain Location: sore Pain Descriptors / Indicators: Sore Pain Intervention(s): Premedicated before session    Home Living Family/patient expects to be discharged to:: Private residence Living Arrangements: Spouse/significant other Available Help at Discharge: Family Type of Home: House Home Access: Stairs to enter Entrance Stairs-Rails: Right;Left Home Layout: One level Home Equipment: Toilet riser      Prior Function Level of Independence: Independent          PT Goals (current goals can now be found in the care plan section) Acute Rehab PT Goals Patient Stated Goal: to go back to work in 1 week PT Goal Formulation: With patient/family Time For Goal Achievement: 07/04/14 Potential to Achieve Goals: Good Progress towards PT goals: Progressing toward goals    Frequency  7X/week    PT Plan Current plan remains appropriate    Co-evaluation             End of Session   Activity Tolerance: Patient tolerated treatment well Patient left: in bed;with call bell/phone within reach;with family/visitor present     Time: 1223-1235 PT Time Calculation (min): 12 min  Charges:  $Gait Training: 8-22 mins $Therapeutic Exercise: 8-22 mins                    G Codes:  Functional Assessment Tool Used: clinical  judgement Functional Limitation: Mobility: Walking and moving around Mobility: Walking and Moving Around Current Status [phone]): At least 20 percent but less than 40 percent impaired, limited or restricted Mobility: Walking and Moving Around Goal Status [phone]): At least 1 percent but less than 20 percent impaired, limited or restricted Mobility: Walking and Moving Around Discharge Status [phone]): At least 1 percent but less than 20 percent impaired, limited or  restricted   [name] 07/03/2014, 12:50 PM

## 2014-07-03 NOTE — Plan of Care (Signed)
Problem: Discharge Progression Outcomes Goal: Anticoagulant follow-up in place Outcome: Not Applicable Date Met:  20/81/38 asa

## 2014-07-03 NOTE — Progress Notes (Signed)
UR completed 

## 2014-07-03 NOTE — Progress Notes (Signed)
Discharged from floor via w/c, spouse with pt. No changes in assessment. Keyston Ardolino   

## 2014-07-03 NOTE — Progress Notes (Signed)
CARE MANAGEMENT NOTE 07/03/2014  Patient:  [name], [name]   Account Number:  [id number]  Date Initiated:  07/03/2014  Documentation initiated by:  [facility]  Subjective/Objective Assessment:   RIGHT MEDIAL UNICOMPARTMENTAL KNEE     Action/Plan:   home with husband   Anticipated DC Date:  07/03/2014   Anticipated DC Plan:  [place]  CM consult      [facility]iatric Choice  HOME HEALTH   Choice offered to / List presented to:  C-1 Patient        [place] arranged  HH-2 PT      [place]   Status of service:  Completed, signed off Medicare Important Message given?  NA - LOS <3 / Initial given by admissions (If response is "NO", the following Medicare IM given date fields will be blank) Date Medicare IM given:   Medicare IM given by:   Date Additional Medicare IM given:   Additional Medicare IM given by:    Discharge Disposition:  [place]  Per UR Regulation:    If discussed at Long Length of Stay Meetings, dates discussed:    Comments:  07/03/2014 1230 NCM spoke to pt and offered choice for [facility]. [place] scheduled for [facility] PT. Pt requesting a Rollator for home. Pt received a RW in 2012 and insurance will not cover Rollator. Pt will have to pay out of pocket. [place] [employer] rep notified and will provide prices for DME. Pt states she has 3n1 at home. Jonnie Finner RN CCM Case Mgmt phone [phone]

## 2014-07-05 ENCOUNTER — Encounter (HOSPITAL_COMMUNITY): Payer: Self-pay | Admitting: Orthopedic Surgery

## 2014-07-26 NOTE — Discharge Summary (Signed)
Physician Discharge Summary  Patient ID: ONIA SHIFLETT MRN: [id number] DOB/AGE: 1940/09/23 73 y.o.  Admit date: 07/02/2014 Discharge date: 07/03/2014   Procedures:  Procedure(s) (LRB): RIGHT MEDIAL UNICOMPARTMENTAL KNEE (Right)  Attending Physician:  Dr. 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 Allergy   . Depression   . Hyperlipidemia   . Hypertension   . Anxiety   . Diverticulosis     PER COLONOSCOPY  . Heart murmur     "VERY SLIGHT ALL MY LIFE"  . Sleep apnea     does not tolerate CPAP - HAS NOT USED IN OVER 2 YRS  . GERD (gastroesophageal reflux disease)     OVER THE COUNTER MEDS IF NEEDED  . Arthritis     PAIN AND OA RIGHT KNEE; PAIN AND DDD AND SPINAL STENOSIS  . Complication of anesthesia     PULLED OUT MY IV'S AND FOLEY - HALLUCINATIONS WITH HIATAL HERNIA SURGERY    HPI: [place], 73 y.o. female, has a history of pain and functional disability in the right knee due to trauma and has failed non-surgical conservative treatments for greater than 12 weeks to include NSAID's and/or analgesics, corticosteriod injections, viscosupplementation injections, use of assistive devices and activity modification. Onset of symptoms was gradual, starting 2+ years ago with gradually worsening course since that time. The patient noted no past surgery on the right knee(s). Patient currently rates pain in the right knee(s) at 10 out of 10 with activity. Patient has worsening of pain with activity and weight bearing, pain that interferes with activities of daily living, pain with passive range of motion, crepitus and joint swelling. Patient has evidence of periarticular osteophytes and joint space narrowing of the medial compartment by imaging studies. There is no active infection. Risks, benefits and expectations were discussed with  the patient. Risks including but not limited to the risk of anesthesia, blood clots, nerve damage, blood vessel damage, failure of the prosthesis, infection and up to and including death. Patient understand the risks, benefits and expectations and wishes to proceed with surgery.   PCP: Olga Millers, MD   Discharged Condition: good  Hospital Course:  Patient underwent the above stated procedure on 07/02/2014. Patient tolerated the procedure well and brought to the recovery room in good condition and subsequently to the floor.  POD #1 BP: 145/78 ; Pulse: 85 ; Temp: 97.7 F (36.5 C) ; Resp: 12 Patient reports pain as mild. She reports that she did not sleep well because of knee discomfort, but over all her pain medications are working well for her. She is eager to go home today. She has not been up with PT yet. She denies any new HA, CP, SOB, N, V, fever, chills, calf swelling or pain. She reports + flatus.  Dorsiflexion/plantar flexion intact, incision: dressing C/D/I, no cellulitis present and compartment soft.   LABS  Basename    HGB  12.5  HCT  37.3    Discharge Exam: General appearance: alert, cooperative and no distress Extremities: Homans sign is negative, no sign of DVT, no edema, redness or tenderness in the calves or thighs and no ulcers, gangrene or trophic changes  Disposition: Home with follow up in 2 weeks   Follow-up Information    Follow up with [facility].   Why:  Home Health Physical Therapy  Contact information:   [address] [place] [address] [phone]       Discharge Instructions    Call MD / Call 911    Complete by:  As directed   If you experience chest pain or shortness of breath, CALL 911 and be transported to the hospital emergency room.  If you develope a fever above 101 F, pus (white drainage) or increased drainage or redness at the wound, or calf pain, call your surgeon's office.     Constipation Prevention     Complete by:  As directed   Drink plenty of fluids.  Prune juice may be helpful.  You may use a stool softener, such as Colace (over the counter) 100 mg twice a day.  Use MiraLax (over the counter) for constipation as needed.     Diet - low sodium heart healthy    Complete by:  As directed      Increase activity slowly as tolerated    Complete by:  As directed      Weight bearing as tolerated    Complete by:  As directed   Laterality:  right  Extremity:  Lower             Medication List    STOP taking these medications        meloxicam 15 MG tablet  Commonly known as:  MOBIC      TAKE these medications        acetaminophen 500 MG tablet  Commonly known as:  TYLENOL  Take 500 mg by mouth every 6 (six) hours as needed for mild pain or headache.     ADVIL 200 MG Caps  Generic drug:  Ibuprofen  Take 400-600 mg by mouth 3 (three) times daily as needed (pain.).     ALPRAZolam 0.25 MG tablet  Commonly known as:  XANAX  Take 0.25 mg by mouth 2 (two) times daily as needed for anxiety.     aspirin 325 MG EC tablet  Take 1 tablet (325 mg total) by mouth 2 (two) times daily.     Choline Fenofibrate 135 MG capsule  Take 135 mg by mouth at bedtime.     diphenhydrAMINE 25 MG tablet  Commonly known as:  BENADRYL  Take 25 mg by mouth at bedtime.     HYDROcodone-acetaminophen 7.5-325 MG per tablet  Commonly known as:  NORCO  Take 1-2 tablets by mouth every 4 (four) hours.     losartan-hydrochlorothiazide 100-12.5 MG per tablet  Commonly known as:  HYZAAR  TAKE 1 TABLET DAILY.     methocarbamol 500 MG tablet  Commonly known as:  ROBAXIN  Take 1 tablet (500 mg total) by mouth every 6 (six) hours as needed for muscle spasms.     mometasone 50 MCG/ACT nasal spray  Commonly known as:  NASONEX  Place 2 sprays into the nose at bedtime.     nabumetone 750 MG tablet  Commonly known as:  RELAFEN  Take 750 mg by mouth every morning.     polyvinyl alcohol 1.4 % ophthalmic  solution  Commonly known as:  LIQUIFILM TEARS  Place 1-2 drops into both eyes daily as needed for dry eyes.     verapamil 240 MG CR tablet  Commonly known as:  CALAN-SR  Take 240 mg by mouth 2 (two) times daily.     Vitamin D 2000 UNITS Caps  Take 2 capsules by mouth every morning.  Signed: [place]. Masson Nalepa   PA-C  07/26/2014, 2:19 PM

## 2014-07-27 ENCOUNTER — Other Ambulatory Visit: Payer: Self-pay | Admitting: Internal Medicine

## 2014-08-02 ENCOUNTER — Ambulatory Visit: Payer: Medicare Other | Admitting: Internal Medicine

## 2014-08-02 ENCOUNTER — Other Ambulatory Visit: Payer: Self-pay | Admitting: Geriatric Medicine

## 2014-08-02 MED ORDER — CHOLINE FENOFIBRATE 135 MG PO CPDR: 135.0000 mg | DELAYED_RELEASE_CAPSULE | Freq: Every day | ORAL | Status: DC

## 2014-08-17 ENCOUNTER — Telehealth: Payer: Self-pay | Admitting: Internal Medicine

## 2014-08-19 ENCOUNTER — Other Ambulatory Visit: Payer: Self-pay | Admitting: Geriatric Medicine

## 2014-08-19 MED ORDER — ALPRAZOLAM 0.25 MG PO TABS: 0.2500 mg | ORAL_TABLET | Freq: Two times a day (BID) | ORAL | Status: DC | PRN

## 2014-08-19 NOTE — Telephone Encounter (Signed)
Pt called to check up on this request. Pt stated she really need this medication today and she has an appt with Dr. Doug Sou tomorrow.

## 2014-08-19 NOTE — Telephone Encounter (Signed)
Printed and faxed

## 2014-08-20 ENCOUNTER — Encounter: Payer: Self-pay | Admitting: Internal Medicine

## 2014-08-20 ENCOUNTER — Ambulatory Visit (INDEPENDENT_AMBULATORY_CARE_PROVIDER_SITE_OTHER): Payer: Medicare Other | Admitting: Internal Medicine

## 2014-08-20 VITALS — BP 142/86 | HR 85 | Temp 98.6°F | Resp 14 | Ht 63.0 in | Wt 195.0 lb

## 2014-08-20 DIAGNOSIS — M150 Primary generalized (osteo)arthritis: Secondary | ICD-10-CM

## 2014-08-20 DIAGNOSIS — K862 Cyst of pancreas: Secondary | ICD-10-CM

## 2014-08-20 DIAGNOSIS — M159 Polyosteoarthritis, unspecified: Secondary | ICD-10-CM

## 2014-08-20 DIAGNOSIS — F411 Generalized anxiety disorder: Secondary | ICD-10-CM | POA: Insufficient documentation

## 2014-08-20 DIAGNOSIS — I10 Essential (primary) hypertension: Secondary | ICD-10-CM

## 2014-08-20 DIAGNOSIS — K863 Pseudocyst of pancreas: Secondary | ICD-10-CM

## 2014-08-20 MED ORDER — ALPRAZOLAM 0.25 MG PO TABS: 0.2500 mg | ORAL_TABLET | Freq: Two times a day (BID) | ORAL | Status: DC | PRN

## 2014-08-20 MED ORDER — ESCITALOPRAM OXALATE 5 MG PO TABS: 5.0000 mg | ORAL_TABLET | Freq: Every day | ORAL | Status: DC

## 2014-08-20 MED ORDER — MELOXICAM 15 MG PO TABS: 15.0000 mg | ORAL_TABLET | Freq: Every day | ORAL | Status: DC

## 2014-08-20 NOTE — Assessment & Plan Note (Signed)
BP mildly elevated today. Room to increase her combo pill dosage. Check BMP next time.

## 2014-08-20 NOTE — Progress Notes (Signed)
   Subjective:    Patient ID: [name], female    DOB: 10-17-1940, 73 y.o.   MRN: [id number]  HPI The patient is a 73 YO female who is coming in to establish care and has PMH of HTN, anxiety, allergies, arthritis. She has been taking xanax BID for years and was upset that it was not refilled appropriately before her visit. She is stressed with caring for her husband and an elderly person with alzheimer's disease. She takes one in the morning and one for sleep and not just prn. She denies problems except some right knee pain. She is not sure if she has meloxicam right now. She denies chest pains. Her husband is with her and keeps interrupting our visit to talk about himself and complain about a recent visit.   Review of Systems  Constitutional: Negative for fever, activity change, appetite change, fatigue and unexpected weight change.  HENT: Negative.   Respiratory: Negative for cough, chest tightness, shortness of breath and wheezing.   Cardiovascular: Negative for chest pain, palpitations and leg swelling.  Gastrointestinal: Negative for abdominal pain, diarrhea, constipation and abdominal distention.  Musculoskeletal: Positive for arthralgias. Negative for back pain and gait problem.  Skin: Negative.   Neurological: Negative.   Psychiatric/Behavioral: Positive for sleep disturbance. Negative for dysphoric mood. The patient is nervous/anxious.       Objective:   Physical Exam  Constitutional: She is oriented to person, place, and time. She appears well-developed and well-nourished.  Overweight  HENT:  Head: Normocephalic and atraumatic.  Eyes: EOM are normal.  Neck: Normal range of motion.  Cardiovascular: Normal rate and regular rhythm.   Pulmonary/Chest: Effort normal and breath sounds normal. No respiratory distress. She has no wheezes. She has no rales.  Abdominal: Soft. Bowel sounds are normal. She exhibits no distension. There is no tenderness. There is no rebound.    Neurological: She is alert and oriented to person, place, and time. Coordination normal.  Skin: Skin is warm and dry.   Filed Vitals:   08/20/14 1548  BP: 142/86  Pulse: 85  Temp: 98.6 F (37 C)  TempSrc: Oral  Resp: 14  Height: 5\' 3"  (1.6 m)  Weight: 195 lb (88.451 kg)  SpO2: 97%      Assessment & Plan:

## 2014-08-20 NOTE — Progress Notes (Signed)
Pre visit review using our clinic review tool, if applicable. No additional management support is needed unless otherwise documented below in the visit note. 

## 2014-08-20 NOTE — Assessment & Plan Note (Signed)
Will review records

## 2014-08-20 NOTE — Assessment & Plan Note (Signed)
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 She is advised not to take xanax except prn and talked to her about the risks associated with regular usage. Start lexapro 5 mg daily and asked her to decrease as much as possible.

## 2014-08-20 NOTE — Assessment & Plan Note (Signed)
Refill meloxicam for pain.

## 2014-08-20 NOTE — Patient Instructions (Signed)
We will have you start taking lexapro 1 tablet daily. This should help to keep you from getting anxious. After you have been taking it for 3 weeks you should try to decrease your xanax and ideally you would not need to take it at all.   We will see you back in about 6 months to check on your health. If you have any new problems or questions please feel free to call our office.   Exercise to Stay Healthy Exercise helps you become and stay healthy. EXERCISE IDEAS AND TIPS Choose exercises that:  You enjoy.  Fit into your day. You do not need to exercise really hard to be healthy. You can do exercises at a slow or medium level and stay healthy. You can:  Stretch before and after working out.  Try yoga, Pilates, or tai chi.  Lift weights.  Walk fast, swim, jog, run, climb stairs, bicycle, dance, or rollerskate.  Take aerobic classes. Exercises that burn about 150 calories:  Running 1  miles in 15 minutes.  Playing volleyball for 45 to 60 minutes.  Washing and waxing a car for 45 to 60 minutes.  Playing touch football for 45 minutes.  Walking 1  miles in 35 minutes.  Pushing a stroller 1  miles in 30 minutes.  Playing basketball for 30 minutes.  Raking leaves for 30 minutes.  Bicycling 5 miles in 30 minutes.  Walking 2 miles in 30 minutes.  Dancing for 30 minutes.  Shoveling snow for 15 minutes.  Swimming laps for 20 minutes.  Walking up stairs for 15 minutes.  Bicycling 4 miles in 15 minutes.  Gardening for 30 to 45 minutes.  Jumping rope for 15 minutes.  Washing windows or floors for 45 to 60 minutes. Document Released: 09/22/2010 Document Revised: 11/12/2011 Document Reviewed: 09/22/2010 [facility] Patient Information 2015 [place], [place]. This information is not intended to replace advice given to you by your health care provider. Make sure you discuss any questions you have with your health care provider.

## 2014-08-31 ENCOUNTER — Telehealth: Payer: Self-pay | Admitting: Internal Medicine

## 2014-09-06 NOTE — Telephone Encounter (Signed)
Spoke to patient and informed her that I will call in her prescription for xanax.

## 2014-09-06 NOTE — Telephone Encounter (Signed)
Pt called stated that she lost her Rx for Alprazolam, she can not find it anywhere. Please check,pt request this to be fax to [facility].

## 2014-09-06 NOTE — Telephone Encounter (Signed)
Pt called in, she said that her hands are shaking because she dont have this med.  She said that she needs it, can find script   [phone]

## 2014-09-16 ENCOUNTER — Encounter (HOSPITAL_COMMUNITY): Payer: Self-pay | Admitting: Orthopedic Surgery

## 2014-09-20 DIAGNOSIS — H3531 Nonexudative age-related macular degeneration: Secondary | ICD-10-CM | POA: Diagnosis not present

## 2014-09-20 DIAGNOSIS — Z961 Presence of intraocular lens: Secondary | ICD-10-CM | POA: Diagnosis not present

## 2014-09-22 ENCOUNTER — Other Ambulatory Visit: Payer: Self-pay | Admitting: Internal Medicine

## 2014-09-27 ENCOUNTER — Other Ambulatory Visit: Payer: Self-pay | Admitting: Geriatric Medicine

## 2014-09-27 MED ORDER — VERAPAMIL HCL ER 240 MG PO TBCR: 240.0000 mg | EXTENDED_RELEASE_TABLET | Freq: Two times a day (BID) | ORAL | Status: DC

## 2014-10-03 ENCOUNTER — Other Ambulatory Visit: Payer: Self-pay | Admitting: Internal Medicine

## 2014-10-08 ENCOUNTER — Telehealth: Payer: Self-pay | Admitting: Internal Medicine

## 2014-10-11 ENCOUNTER — Other Ambulatory Visit: Payer: Self-pay | Admitting: Geriatric Medicine

## 2014-10-11 ENCOUNTER — Telehealth: Payer: Self-pay | Admitting: Internal Medicine

## 2014-10-11 MED ORDER — LOSARTAN POTASSIUM-HCTZ 100-12.5 MG PO TABS: 1.0000 | ORAL_TABLET | Freq: Every day | ORAL | Status: DC

## 2014-10-11 MED ORDER — ALPRAZOLAM 0.25 MG PO TABS: 0.2500 mg | ORAL_TABLET | Freq: Two times a day (BID) | ORAL | Status: DC | PRN

## 2014-10-11 NOTE — Telephone Encounter (Signed)
Losartan sent to [facility] and Alprozolam will be signed and faxed.

## 2014-10-11 NOTE — Telephone Encounter (Signed)
No. Unfortunately, I do not have availability currently due to several providers leaving and several more leaving over the next few months. Thank you.

## 2014-10-11 NOTE — Telephone Encounter (Signed)
Patient needs losartan and alprazolam sent to [facility].  Patient is upset b/c there is not a response to [employer] request.  States pharmacy has tried to get losartan refilled since the 28th.  Patients states this has happened before.  Please call patient as soon as this is sent over.

## 2014-10-11 NOTE — Telephone Encounter (Signed)
Same as for husband and per Dr. Julianne Rice message

## 2014-10-11 NOTE — Telephone Encounter (Signed)
Pt would like to switch to dr kim from dr Doug Sou. Pt states she is not getting the service she should be getting.

## 2014-10-11 NOTE — Telephone Encounter (Signed)
Dr. Yong Channel will you take pt?

## 2014-10-11 NOTE — Telephone Encounter (Signed)
Patient informed of the message below and asked that the message be forwarded to Dr Yong Channel to see if he will see her?

## 2014-10-11 NOTE — Telephone Encounter (Signed)
Left msg on triage stating been trying to get pt BP med refilled. Pharmacy has sent request x's 2. Pt is out of needs needing refills today. Per chart request came through 10/03/14 not sure why refill was not sent back sent refills to [facility]....[name]

## 2014-10-12 NOTE — Telephone Encounter (Signed)
Mrs. [name] see below

## 2014-10-12 NOTE — Telephone Encounter (Signed)
Pt is aware.  

## 2014-10-12 NOTE — Telephone Encounter (Signed)
lmom for pt to cb

## 2014-12-09 ENCOUNTER — Other Ambulatory Visit: Payer: Self-pay | Admitting: Internal Medicine

## 2014-12-23 ENCOUNTER — Other Ambulatory Visit (INDEPENDENT_AMBULATORY_CARE_PROVIDER_SITE_OTHER): Payer: Medicare Other

## 2014-12-23 ENCOUNTER — Encounter: Payer: Self-pay | Admitting: Internal Medicine

## 2014-12-23 ENCOUNTER — Ambulatory Visit (INDEPENDENT_AMBULATORY_CARE_PROVIDER_SITE_OTHER): Payer: Medicare Other | Admitting: Internal Medicine

## 2014-12-23 VITALS — BP 138/82 | HR 94 | Temp 98.8°F | Wt 201.5 lb

## 2014-12-23 DIAGNOSIS — Z0000 Encounter for general adult medical examination without abnormal findings: Secondary | ICD-10-CM | POA: Diagnosis not present

## 2014-12-23 DIAGNOSIS — E669 Obesity, unspecified: Secondary | ICD-10-CM

## 2014-12-23 DIAGNOSIS — I10 Essential (primary) hypertension: Secondary | ICD-10-CM

## 2014-12-23 DIAGNOSIS — Z79899 Other long term (current) drug therapy: Secondary | ICD-10-CM | POA: Diagnosis not present

## 2014-12-23 LAB — HEMOGLOBIN A1C: Hgb A1c MFr Bld: 6.2 % (ref 4.6–6.5)

## 2014-12-23 MED ORDER — MIRABEGRON ER 25 MG PO TB24: 25.0000 mg | ORAL_TABLET | Freq: Every day | ORAL | Status: DC

## 2014-12-23 NOTE — Assessment & Plan Note (Signed)
BP controlled on diltiazem, losartan hctz and check labs today.

## 2014-12-23 NOTE — Assessment & Plan Note (Signed)
Checking labs, she thinks she has had bone density so will check records. Up to date on immunizations including tetanus, pneumonia, shingles, flu. Colonoscopy and mammogram up to date. Given list of 10 year screening she needs to have done. Talked with her about her diet and regular exercise to keep her healthy and she will work on making some changes. Talked with her about advanced directives and she does not have those in place and will think about what she wants.

## 2014-12-23 NOTE — Assessment & Plan Note (Signed)
Checking HgA1c and lipids. Talked to her about the need for regular exercise.

## 2014-12-23 NOTE — Patient Instructions (Addendum)
We will check on the blood work today and call you back with the results.   We will try a medicine called myrbetriq which can help to slow down the bladder and help you to not wake up multiple times to urinate. It may take 1-2 months to help out. You can try it.   We have given you some exercises for your husband to help his dizziness.   Health Maintenance Adopting a healthy lifestyle and getting preventive care can go a long way to promote health and wellness. Talk with your health care provider about what schedule of regular examinations is right for you. This is a good chance for you to check in with your provider about disease prevention and staying healthy. In between checkups, there are plenty of things you can do on your own. Experts have done a lot of research about which lifestyle changes and preventive measures are most likely to keep you healthy. Ask your health care provider for more information. WEIGHT AND DIET  Eat a healthy diet  Be sure to include plenty of vegetables, fruits, low-fat dairy products, and lean protein.  Do not eat a lot of foods high in solid fats, added sugars, or salt.  Get regular exercise. This is one of the most important things you can do for your health.  Most adults should exercise for at least 150 minutes each week. The exercise should increase your heart rate and make you sweat (moderate-intensity exercise).  Most adults should also do strengthening exercises at least twice a week. This is in addition to the moderate-intensity exercise.  Maintain a healthy weight  Body mass index (BMI) is a measurement that can be used to identify possible weight problems. It estimates body fat based on height and weight. Your health care provider can help determine your BMI and help you achieve or maintain a healthy weight.  For females 68 years of age and older:   A BMI below 18.5 is considered underweight.  A BMI of 18.5 to 24.9 is normal.  A BMI of 25 to  29.9 is considered overweight.  A BMI of 30 and above is considered obese.  Watch levels of cholesterol and blood lipids  You should start having your blood tested for lipids and cholesterol at 75 years of age, then have this test every 5 years.  You may need to have your cholesterol levels checked more often if:  Your lipid or cholesterol levels are high.  You are older than 75 years of age.  You are at high risk for heart disease.  CANCER SCREENING   Lung Cancer  Lung cancer screening is recommended for adults 56-67 years old who are at high risk for lung cancer because of a history of smoking.  A yearly low-dose CT scan of the lungs is recommended for people who:  Currently smoke.  Have quit within the past 15 years.  Have at least a 30-pack-year history of smoking. A pack year is smoking an average of one pack of cigarettes a day for 1 year.  Yearly screening should continue until it has been 15 years since you quit.  Yearly screening should stop if you develop a health problem that would prevent you from having lung cancer treatment.  Breast Cancer  Practice breast self-awareness. This means understanding how your breasts normally appear and feel.  It also means doing regular breast self-exams. Let your health care provider know about any changes, no matter how small.  If you are  in your 50s or 30s, you should have a clinical breast exam (CBE) by a health care provider every 1-3 years as part of a regular health exam.  If you are 20 or older, have a CBE every year. Also consider having a breast X-ray (mammogram) every year.  If you have a family history of breast cancer, talk to your health care provider about genetic screening.  If you are at high risk for breast cancer, talk to your health care provider about having an MRI and a mammogram every year.  Breast cancer gene (BRCA) assessment is recommended for women who have family members with BRCA-related  cancers. BRCA-related cancers include:  Breast.  Ovarian.  Tubal.  Peritoneal cancers.  Results of the assessment will determine the need for genetic counseling and BRCA1 and BRCA2 testing. Cervical Cancer Routine pelvic examinations to screen for cervical cancer are no longer recommended for nonpregnant women who are considered low risk for cancer of the pelvic organs (ovaries, uterus, and vagina) and who do not have symptoms. A pelvic examination may be necessary if you have symptoms including those associated with pelvic infections. Ask your health care provider if a screening pelvic exam is right for you.   The Pap test is the screening test for cervical cancer for women who are considered at risk.  If you had a hysterectomy for a problem that was not cancer or a condition that could lead to cancer, then you no longer need Pap tests.  If you are older than 65 years, and you have had normal Pap tests for the past 10 years, you no longer need to have Pap tests.  If you have had past treatment for cervical cancer or a condition that could lead to cancer, you need Pap tests and screening for cancer for at least 20 years after your treatment.  If you no longer get a Pap test, assess your risk factors if they change (such as having a new sexual partner). This can affect whether you should start being screened again.  Some women have medical problems that increase their chance of getting cervical cancer. If this is the case for you, your health care provider may recommend more frequent screening and Pap tests.  The human papillomavirus (HPV) test is another test that may be used for cervical cancer screening. The HPV test looks for the virus that can cause cell changes in the cervix. The cells collected during the Pap test can be tested for HPV.  The HPV test can be used to screen women 28 years of age and older. Getting tested for HPV can extend the interval between normal Pap tests from  three to five years.  An HPV test also should be used to screen women of any age who have unclear Pap test results.  After 75 years of age, women should have HPV testing as often as Pap tests.  Colorectal Cancer  This type of cancer can be detected and often prevented.  Routine colorectal cancer screening usually begins at 75 years of age and continues through 75 years of age.  Your health care provider may recommend screening at an earlier age if you have risk factors for colon cancer.  Your health care provider may also recommend using home test kits to check for hidden blood in the stool.  A small camera at the end of a tube can be used to examine your colon directly (sigmoidoscopy or colonoscopy). This is done to check for the earliest forms  of colorectal cancer.  Routine screening usually begins at age 73.  Direct examination of the colon should be repeated every 5-10 years through 75 years of age. However, you may need to be screened more often if early forms of precancerous polyps or small growths are found. Skin Cancer  Check your skin from head to toe regularly.  Tell your health care provider about any new moles or changes in moles, especially if there is a change in a mole's shape or color.  Also tell your health care provider if you have a mole that is larger than the size of a pencil eraser.  Always use sunscreen. Apply sunscreen liberally and repeatedly throughout the day.  Protect yourself by wearing long sleeves, pants, a wide-brimmed hat, and sunglasses whenever you are outside. HEART DISEASE, DIABETES, AND HIGH BLOOD PRESSURE   Have your blood pressure checked at least every 1-2 years. High blood pressure causes heart disease and increases the risk of stroke.  If you are between 45 years and 46 years old, ask your health care provider if you should take aspirin to prevent strokes.  Have regular diabetes screenings. This involves taking a blood sample to check  your fasting blood sugar level.  If you are at a normal weight and have a low risk for diabetes, have this test once every three years after 74 years of age.  If you are overweight and have a high risk for diabetes, consider being tested at a younger age or more often. PREVENTING INFECTION  Hepatitis B  If you have a higher risk for hepatitis B, you should be screened for this virus. You are considered at high risk for hepatitis B if:  You were born in a country where hepatitis B is common. Ask your health care provider which countries are considered high risk.  Your parents were born in a high-risk country, and you have not been immunized against hepatitis B (hepatitis B vaccine).  You have HIV or AIDS.  You use needles to inject street drugs.  You live with someone who has hepatitis B.  You have had sex with someone who has hepatitis B.  You get hemodialysis treatment.  You take certain medicines for conditions, including cancer, organ transplantation, and autoimmune conditions. Hepatitis C  Blood testing is recommended for:  Everyone born from [birth year] through 1966.  Anyone with known risk factors for hepatitis C. Sexually transmitted infections (STIs)  You should be screened for sexually transmitted infections (STIs) including gonorrhea and chlamydia if:  You are sexually active and are younger than 75 years of age.  You are older than 75 years of age and your health care provider tells you that you are at risk for this type of infection.  Your sexual activity has changed since you were last screened and you are at an increased risk for chlamydia or gonorrhea. Ask your health care provider if you are at risk.  If you do not have HIV, but are at risk, it may be recommended that you take a prescription medicine daily to prevent HIV infection. This is called pre-exposure prophylaxis (PrEP). You are considered at risk if:  You are sexually active and do not regularly use  condoms or know the HIV status of your partner(s).  You take drugs by injection.  You are sexually active with a partner who has HIV. Talk with your health care provider about whether you are at high risk of being infected with HIV. If you choose to begin  PrEP, you should first be tested for HIV. You should then be tested every 3 months for as long as you are taking PrEP.  PREGNANCY   If you are premenopausal and you may become pregnant, ask your health care provider about preconception counseling.  If you may become pregnant, take 400 to 800 micrograms (mcg) of folic acid every day.  If you want to prevent pregnancy, talk to your health care provider about birth control (contraception). OSTEOPOROSIS AND MENOPAUSE   Osteoporosis is a disease in which the bones lose minerals and strength with aging. This can result in serious bone fractures. Your risk for osteoporosis can be identified using a bone density scan.  If you are 56 years of age or older, or if you are at risk for osteoporosis and fractures, ask your health care provider if you should be screened.  Ask your health care provider whether you should take a calcium or vitamin D supplement to lower your risk for osteoporosis.  Menopause may have certain physical symptoms and risks.  Hormone replacement therapy may reduce some of these symptoms and risks. Talk to your health care provider about whether hormone replacement therapy is right for you.  HOME CARE INSTRUCTIONS   Schedule regular health, dental, and eye exams.  Stay current with your immunizations.   Do not use any tobacco products including cigarettes, chewing tobacco, or electronic cigarettes.  If you are pregnant, do not drink alcohol.  If you are breastfeeding, limit how much and how often you drink alcohol.  Limit alcohol intake to no more than 1 drink per day for nonpregnant women. One drink equals 12 ounces of beer, 5 ounces of wine, or 1 ounces of hard  liquor.  Do not use street drugs.  Do not share needles.  Ask your health care provider for help if you need support or information about quitting drugs.  Tell your health care provider if you often feel depressed.  Tell your health care provider if you have ever been abused or do not feel safe at home. Document Released: 03/05/2011 Document Revised: 01/04/2014 Document Reviewed: 07/22/2013 [facility] Patient Information 2015 [place], [place]. This information is not intended to replace advice given to you by your health care provider. Make sure you discuss any questions you have with your health care provider.

## 2014-12-23 NOTE — Progress Notes (Signed)
Patient received education resource, including the self-management goal and tool. Patient verbalized understanding. 

## 2014-12-23 NOTE — Progress Notes (Signed)
   Subjective:    Patient ID: [name], female    DOB: [birth year], 74 y.o.   MRN: [id number]  HPI Here for medicare wellness, no new complaints. Please see A/P for status and treatment of chronic medical problems. Has been doing better with her anxiety and only using xanax once a day most days.   Diet: poor Physical activity: sedentary Depression/mood screen: negative Hearing: intact to whispered voice Visual acuity: grossly normal, performs annual eye exam  ADLs: capable Fall risk: none Home safety: good Cognitive evaluation: intact to orientation, naming, recall and repetition EOL planning: adv directives discussed  I have personally reviewed and have noted 1. The patient's medical and social history - reviewed today no changes 2. Their use of alcohol, tobacco or illicit drugs 3. Their current medications and supplements 4. The patient's functional ability including ADL's, fall risks, home safety risks and hearing or visual impairment. 5. Diet and physical activities 6. Evidence for depression or mood disorders 7. Care team reviewed and updated (available in snapshot)  Review of Systems  Constitutional: Negative for fever, activity change, appetite change, fatigue and unexpected weight change.  HENT: Negative.   Respiratory: Negative for cough, chest tightness, shortness of breath and wheezing.   Cardiovascular: Negative for chest pain, palpitations and leg swelling.  Gastrointestinal: Negative for abdominal pain, diarrhea, constipation and abdominal distention.  Musculoskeletal: Positive for arthralgias. Negative for back pain and gait problem.  Skin: Negative.   Neurological: Negative.   Psychiatric/Behavioral: Negative for dysphoric mood.      Objective:   Physical Exam  Constitutional: She is oriented to person, place, and time. She appears well-developed and well-nourished.  Overweight  HENT:  Head: Normocephalic and atraumatic.  Eyes: EOM are normal.    Neck: Normal range of motion.  Cardiovascular: Normal rate and regular rhythm.   Pulmonary/Chest: Effort normal and breath sounds normal. No respiratory distress. She has no wheezes. She has no rales.  Abdominal: Soft. Bowel sounds are normal. She exhibits no distension. There is no tenderness. There is no rebound.  Neurological: She is alert and oriented to person, place, and time. Coordination normal.  Skin: Skin is warm and dry.   Filed Vitals:   12/23/14 1614  BP: 138/82  Pulse: 94  Temp: 98.8 F (37.1 C)  Weight: 201 lb 8 oz (91.4 kg)  SpO2: 98%      Assessment & Plan:

## 2014-12-24 LAB — LIPID PANEL
CHOL/HDL RATIO: 4
Cholesterol: 183 mg/dL (ref 0–200)
HDL: 48.1 mg/dL (ref >39.00)
LDL Cholesterol: 114 mg/dL — ABNORMAL HIGH (ref 0–99)
NONHDL: 134.9
TRIGLYCERIDES: 103 mg/dL (ref 0.0–149.0)
VLDL: 20.6 mg/dL (ref 0.0–40.0)

## 2014-12-27 ENCOUNTER — Encounter: Payer: Self-pay | Admitting: Internal Medicine

## 2014-12-28 ENCOUNTER — Other Ambulatory Visit: Payer: Self-pay | Admitting: Internal Medicine

## 2014-12-28 MED ORDER — OXYBUTYNIN CHLORIDE ER 10 MG PO TB24: 10.0000 mg | ORAL_TABLET | Freq: Every day | ORAL | Status: DC

## 2015-01-04 ENCOUNTER — Other Ambulatory Visit: Payer: Self-pay | Admitting: Internal Medicine

## 2015-01-26 ENCOUNTER — Other Ambulatory Visit: Payer: Self-pay | Admitting: Internal Medicine

## 2015-03-22 ENCOUNTER — Encounter: Payer: Self-pay | Admitting: Internal Medicine

## 2015-03-22 ENCOUNTER — Ambulatory Visit (INDEPENDENT_AMBULATORY_CARE_PROVIDER_SITE_OTHER): Payer: Medicare Other | Admitting: Internal Medicine

## 2015-03-22 VITALS — BP 140/84 | HR 75 | Temp 98.0°F | Resp 14 | Wt 209.0 lb

## 2015-03-22 DIAGNOSIS — L821 Other seborrheic keratosis: Secondary | ICD-10-CM | POA: Diagnosis not present

## 2015-03-22 NOTE — Progress Notes (Signed)
   Subjective:    Patient ID: [name], female    DOB: 1941/04/05, 74 y.o.   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 She questions whether there's been some change in the last 1. 5-2 weeks in the lesions.  She has irritable bowel syndrome with alternating constipation and stool frequency. With the stool frequency she's had some hemorrhoidal bleeding.  Her colonoscopy in October 2015 revealed "pockets" which "empty causing the frequency".  She's had some urinary incontinence due to incomplete voiding. She is on Ditropan & Myrbetriq.  She has no other genitourinary, GI, or constitutional symptoms.  Review of Systems  No vesicles, pustules or urticaria noted.  Fever ,chills , or sweats denied.   No dysuria, pyuria or hematuria.  Unexplained weight loss, abdominal pain, significant dyspepsia, dysphagia, melena, rectal bleeding, or persistently small caliber stools are denied.    Objective:   Physical Exam  Pertinent or positive findings include: She has at least 10 keratoses which are flat and fleshy in the suprapubic area. None are inflamed at this time. They range in size from 7 mm to 31 mm in length. Her gait is somewhat knock kneed and deliberate. General appearance :adequately nourished; in no distress.  Eyes: No conjunctival inflammation or scleral icterus is present.  Oral exam:  Lips and gums are healthy appearing.There is no oropharyngeal erythema or exudate noted. Dental hygiene is good.  Heart:  Normal rate and regular rhythm. S1 and S2 normal without gallop, murmur, click, rub or other extra sounds    Lungs:Chest clear to auscultation; no wheezes, rhonchi,rales ,or rubs present.No increased work of breathing.   Abdomen: bowel sounds normal, soft and non-tender without masses, organomegaly or hernias noted.  No guarding or rebound. No  flank tenderness to percussion.  Vascular : all pulses equal ; no bruits present.  Skin:Warm & dry.  Intact without suspicious lesions or rashes ; no tenting or jaundice   Lymphatic: No lymphadenopathy is noted about the head, neck, axilla.   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 Clinically there is no evidence of MRSA.  Plan: See after visit summary.

## 2015-03-22 NOTE — Progress Notes (Signed)
Pre visit review using our clinic review tool, if applicable. No additional management support is needed unless otherwise documented below in the visit note. 

## 2015-03-22 NOTE — Patient Instructions (Signed)
Use  Aveeno Daily  Moisturizing Lotion  twice a day  for the dry skin. Bathe with moisturizing liquid soap , not bar soap. Use your cell phone camera to monitor  the skin lesions . Take a photo of the skin lesions every 1.5 months with a small ruler immediately below the lesion to define any change in size, shape or color.

## 2015-04-06 ENCOUNTER — Other Ambulatory Visit: Payer: Self-pay | Admitting: Internal Medicine

## 2015-04-07 ENCOUNTER — Other Ambulatory Visit: Payer: Self-pay

## 2015-04-07 MED ORDER — LOSARTAN POTASSIUM-HCTZ 100-12.5 MG PO TABS: 1.0000 | ORAL_TABLET | Freq: Every day | ORAL | Status: DC

## 2015-05-13 ENCOUNTER — Encounter: Payer: Self-pay | Admitting: Internal Medicine

## 2015-05-13 ENCOUNTER — Ambulatory Visit (INDEPENDENT_AMBULATORY_CARE_PROVIDER_SITE_OTHER): Payer: Medicare Other | Admitting: Internal Medicine

## 2015-05-13 VITALS — BP 132/78 | HR 78 | Temp 98.2°F | Resp 14 | Ht 63.0 in | Wt 202.0 lb

## 2015-05-13 DIAGNOSIS — L989 Disorder of the skin and subcutaneous tissue, unspecified: Secondary | ICD-10-CM | POA: Diagnosis not present

## 2015-05-13 DIAGNOSIS — Z23 Encounter for immunization: Secondary | ICD-10-CM | POA: Diagnosis not present

## 2015-05-13 DIAGNOSIS — Z1239 Encounter for other screening for malignant neoplasm of breast: Secondary | ICD-10-CM | POA: Diagnosis not present

## 2015-05-13 DIAGNOSIS — K219 Gastro-esophageal reflux disease without esophagitis: Secondary | ICD-10-CM | POA: Diagnosis not present

## 2015-05-13 MED ORDER — RANITIDINE HCL 150 MG PO TABS: 150.0000 mg | ORAL_TABLET | Freq: Two times a day (BID) | ORAL | Status: DC

## 2015-05-13 NOTE — Addendum Note (Signed)
Addended by: Vertell Novak A on: 05/13/2015 02:41 PM   Modules accepted: Orders

## 2015-05-13 NOTE — Assessment & Plan Note (Addendum)
New problem with prior hiatal hernia repair. She will get back in with her surgeon, advised her to consider seeing GI. Will start ranitidine in the evening (or BID) for symptoms. Talked to her about GERD diet and elevating bed. Weight is up about 5-10 pounds since last year and talked to her about how this increased pressure can worsen GERD in general and she should work on decreasing her weight.

## 2015-05-13 NOTE — Patient Instructions (Signed)
We have sent in the medicine for the stomach called ranitidine (zantac). I would recommend to take it after dinner each evening to help with your symptoms. If you are still having symptoms you can take it twice a day.   Work on losing some of the weight you have gained over the last year as this helps to take some of the pressure off the stomach and can improve acid reflux symptoms. Call and get in with the surgeon although I am not sure at this time there is anything that they would do surgically.   Food Choices for Gastroesophageal Reflux Disease When you have gastroesophageal reflux disease (GERD), the foods you eat and your eating habits are very important. Choosing the right foods can help ease the discomfort of GERD. WHAT GENERAL GUIDELINES DO I NEED TO FOLLOW?  Choose fruits, vegetables, whole grains, low-fat dairy products, and low-fat meat, fish, and poultry.  Limit fats such as oils, salad dressings, butter, nuts, and avocado.  Keep a food diary to identify foods that cause symptoms.  Avoid foods that cause reflux. These may be different for different people.  Eat frequent small meals instead of three large meals each day.  Eat your meals slowly, in a relaxed setting.  Limit fried foods.  Cook foods using methods other than frying.  Avoid drinking alcohol.  Avoid drinking large amounts of liquids with your meals.  Avoid bending over or lying down until 2-3 hours after eating. WHAT FOODS ARE NOT RECOMMENDED? The following are some foods and drinks that may worsen your symptoms: Vegetables Tomatoes. Tomato juice. Tomato and spaghetti sauce. Chili peppers. Onion and garlic. Horseradish. Fruits Oranges, grapefruit, and lemon (fruit and juice). Meats High-fat meats, fish, and poultry. This includes hot dogs, ribs, ham, sausage, salami, and bacon. Dairy Whole milk and chocolate milk. Sour cream. Cream. Butter. Ice cream. Cream cheese.  Beverages Coffee and tea, with or  without caffeine. Carbonated beverages or energy drinks. Condiments Hot sauce. Barbecue sauce.  Sweets/Desserts Chocolate and cocoa. Donuts. Peppermint and spearmint. Fats and Oils High-fat foods, including [place] fries and potato chips. Other Vinegar. Strong spices, such as black pepper, white pepper, red pepper, cayenne, curry powder, cloves, ginger, and chili powder. The items listed above may not be a complete list of foods and beverages to avoid. Contact your dietitian for more information. Document Released: 08/20/2005 Document Revised: 08/25/2013 Document Reviewed: 06/24/2013 [facility] Patient Information 2015 [place], [place]. This information is not intended to replace advice given to you by your health care provider. Make sure you discuss any questions you have with your health care provider.

## 2015-05-13 NOTE — Progress Notes (Signed)
Pre visit review using our clinic review tool, if applicable. No additional management support is needed unless otherwise documented below in the visit note. 

## 2015-05-13 NOTE — Progress Notes (Signed)
   Subjective:    Patient ID: [name], female    DOB: 17-Feb-1941, 74 y.o.   MRN: [id number]  HPI The patient is a 74 YO female coming in today for stomach pain and new acid reflux. She had severe acid reflux in the past with large hiatal hernia which was surgically repaired. She thinks that several months ago she had episode of vomiting and thinks that caused the hernia to tear. Sees Johnathan Hausen for her local surgeon. She now has acid reflux symptoms at night time all nights. Eats dinner around 4 PM and goes to bed around 10 PM. No snacking before bedtime. Wakes up with sour taste and burning in her esophagus. Also some bloating and belching with meals. Taking maalox which helps within 2 minutes of taking. Has not tried anything else yet.   Review of Systems  Constitutional: Negative for fever, activity change, appetite change, fatigue and unexpected weight change.  Respiratory: Negative.   Cardiovascular: Negative.   Gastrointestinal: Positive for abdominal pain and constipation. Negative for nausea, diarrhea, blood in stool and abdominal distention.  Skin: Negative.   Neurological: Negative.       Objective:   Physical Exam  Constitutional: She is oriented to person, place, and time. She appears well-developed and well-nourished.  Overweight  HENT:  Head: Normocephalic and atraumatic.  Eyes: EOM are normal.  Neck: Normal range of motion.  Cardiovascular: Normal rate and regular rhythm.   Pulmonary/Chest: Effort normal and breath sounds normal. No respiratory distress. She has no wheezes. She has no rales.  Abdominal: Soft. Bowel sounds are normal. She exhibits no distension. There is no tenderness. There is no rebound.  Neurological: She is alert and oriented to person, place, and time. Coordination normal.  Skin: Skin is warm and dry.   Filed Vitals:   05/13/15 0804  BP: 132/78  Pulse: 78  Temp: 98.2 F (36.8 C)  TempSrc: Oral  Resp: 14  Height: 5\' 3"  (1.6 m)    Weight: 202 lb (91.627 kg)  SpO2: 97%      Assessment & Plan:  Flu shot given at visit.

## 2015-05-29 ENCOUNTER — Other Ambulatory Visit: Payer: Self-pay | Admitting: Internal Medicine

## 2015-06-01 DIAGNOSIS — R208 Other disturbances of skin sensation: Secondary | ICD-10-CM | POA: Diagnosis not present

## 2015-06-01 DIAGNOSIS — L820 Inflamed seborrheic keratosis: Secondary | ICD-10-CM | POA: Diagnosis not present

## 2015-06-09 ENCOUNTER — Other Ambulatory Visit: Payer: Self-pay | Admitting: Internal Medicine

## 2015-06-20 ENCOUNTER — Other Ambulatory Visit: Payer: Self-pay | Admitting: Internal Medicine

## 2015-06-30 ENCOUNTER — Other Ambulatory Visit: Payer: Self-pay | Admitting: Internal Medicine

## 2015-07-06 ENCOUNTER — Other Ambulatory Visit: Payer: Self-pay | Admitting: Internal Medicine

## 2015-07-12 ENCOUNTER — Other Ambulatory Visit: Payer: Self-pay | Admitting: Internal Medicine

## 2015-08-04 ENCOUNTER — Other Ambulatory Visit: Payer: Self-pay | Admitting: Internal Medicine

## 2015-08-10 ENCOUNTER — Other Ambulatory Visit: Payer: Self-pay | Admitting: Internal Medicine

## 2015-08-15 ENCOUNTER — Other Ambulatory Visit: Payer: Self-pay | Admitting: Internal Medicine

## 2015-09-02 ENCOUNTER — Ambulatory Visit (INDEPENDENT_AMBULATORY_CARE_PROVIDER_SITE_OTHER): Payer: Medicare Other | Admitting: Internal Medicine

## 2015-09-02 ENCOUNTER — Encounter: Payer: Self-pay | Admitting: Internal Medicine

## 2015-09-02 VITALS — BP 122/88 | HR 75 | Temp 98.1°F | Resp 20 | Ht 63.0 in | Wt 205.2 lb

## 2015-09-02 DIAGNOSIS — J3089 Other allergic rhinitis: Secondary | ICD-10-CM

## 2015-09-02 DIAGNOSIS — M150 Primary generalized (osteo)arthritis: Secondary | ICD-10-CM | POA: Diagnosis not present

## 2015-09-02 DIAGNOSIS — M159 Polyosteoarthritis, unspecified: Secondary | ICD-10-CM

## 2015-09-02 MED ORDER — TIZANIDINE HCL 2 MG PO TABS: 2.0000 mg | ORAL_TABLET | Freq: Four times a day (QID) | ORAL | Status: DC | PRN

## 2015-09-02 MED ORDER — MOMETASONE FUROATE 50 MCG/ACT NA SUSP: 2.0000 | Freq: Every day | NASAL | Status: DC

## 2015-09-02 NOTE — Assessment & Plan Note (Signed)
Rx for nasonex for her symptoms. Reassured about typical course and that she does not need antibiotics at this time.

## 2015-09-02 NOTE — Patient Instructions (Signed)
We have sent in tizanidine to the pharmacy to have for the back pain if needed.   We have sent in the refill of the nasonex to take for the cold symptoms.   It is okay to take mucinex without the DM or delsym for the cold symptoms.

## 2015-09-02 NOTE — Progress Notes (Signed)
Pre visit review using our clinic review tool, if applicable. No additional management support is needed unless otherwise documented below in the visit note. 

## 2015-09-02 NOTE — Progress Notes (Signed)
   Subjective:    Patient ID: [name], female    DOB: 1940-12-07, 74 y.o.   MRN: [id number]  HPI The patient is a 74 YO female coming in for cold symptoms. She started with nasal congestion and sore throat. The sore throat is improving. Started 4-5 days ago. Ran out of her nasonex yesterday and felt like it was helping. No fevers or chills. Some cough with mild production. No SOB or chest pains. Sick contacts over [place]. No muscle aches or fatigue.   Review of Systems  Constitutional: Negative for fever, activity change, appetite change, fatigue and unexpected weight change.  HENT: Positive for congestion, postnasal drip and rhinorrhea. Negative for ear discharge, ear pain, sinus pressure and sore throat.   Eyes: Negative.   Respiratory: Positive for cough. Negative for chest tightness, shortness of breath and wheezing.   Cardiovascular: Negative for chest pain, palpitations and leg swelling.  Gastrointestinal: Positive for constipation. Negative for nausea, abdominal pain, diarrhea, blood in stool and abdominal distention.  Skin: Negative.   Neurological: Negative.       Objective:   Physical Exam  Constitutional: She is oriented to person, place, and time. She appears well-developed and well-nourished.  Overweight  HENT:  Head: Normocephalic and atraumatic.  Right Ear: External ear normal.  Left Ear: External ear normal.  Oropharynx with redness and clear drainage  Eyes: EOM are normal.  Neck: Normal range of motion.  Cardiovascular: Normal rate and regular rhythm.   Pulmonary/Chest: Effort normal and breath sounds normal. No respiratory distress. She has no wheezes. She has no rales.  Abdominal: Soft. Bowel sounds are normal. She exhibits no distension. There is no tenderness. There is no rebound.  Lymphadenopathy:    She has no cervical adenopathy.  Neurological: She is alert and oriented to person, place, and time. Coordination normal.  Skin: Skin is warm and dry.    Filed Vitals:   09/02/15 1348  BP: 122/88  Pulse: 75  Temp: 98.1 F (36.7 C)  TempSrc: Oral  Resp: 20  Height: 5\' 3"  (1.6 m)  Weight: 205 lb 4 oz (93.101 kg)  SpO2: 98%      Assessment & Plan:

## 2015-09-02 NOTE — Assessment & Plan Note (Signed)
She does not take NSAIDs at this time, rx for tizanidine for low back pain.

## 2015-09-05 ENCOUNTER — Other Ambulatory Visit: Payer: Self-pay | Admitting: Internal Medicine

## 2015-09-13 ENCOUNTER — Other Ambulatory Visit: Payer: Self-pay | Admitting: Internal Medicine

## 2015-09-27 ENCOUNTER — Other Ambulatory Visit: Payer: Self-pay | Admitting: Internal Medicine

## 2015-09-30 ENCOUNTER — Ambulatory Visit
Admission: RE | Admit: 2015-09-30 | Discharge: 2015-09-30 | Disposition: A | Discharge status: Home / Self Care | Payer: Medicare Other | Source: Ambulatory Visit | Attending: Internal Medicine | Admitting: Internal Medicine

## 2015-09-30 ENCOUNTER — Other Ambulatory Visit: Payer: Self-pay | Admitting: Internal Medicine

## 2015-09-30 DIAGNOSIS — Z1231 Encounter for screening mammogram for malignant neoplasm of breast: Secondary | ICD-10-CM | POA: Diagnosis not present

## 2015-09-30 DIAGNOSIS — Z1239 Encounter for other screening for malignant neoplasm of breast: Secondary | ICD-10-CM

## 2015-10-04 ENCOUNTER — Other Ambulatory Visit: Payer: Self-pay | Admitting: Internal Medicine

## 2015-10-11 ENCOUNTER — Other Ambulatory Visit: Payer: Self-pay | Admitting: Internal Medicine

## 2015-10-17 ENCOUNTER — Other Ambulatory Visit: Payer: Self-pay | Admitting: Internal Medicine

## 2015-10-21 ENCOUNTER — Ambulatory Visit (INDEPENDENT_AMBULATORY_CARE_PROVIDER_SITE_OTHER): Payer: Medicare Other

## 2015-10-21 VITALS — BP 144/80 | Ht 64.0 in | Wt 207.2 lb

## 2015-10-21 DIAGNOSIS — Z0000 Encounter for general adult medical examination without abnormal findings: Secondary | ICD-10-CM

## 2015-10-21 NOTE — Patient Instructions (Addendum)
[name] , Thank you for taking time to come for your Medicare Wellness Visit. I appreciate your ongoing commitment to your health goals. Please review the following plan we discussed and let me know if I can assist you in the future.   Will have dexa scan sometimes this year to recheck bone density  Will see Dr. Sharlet Salina in 30 days if you do not have more energy  Below are resources; Going to the pool twice a week; will help you feel better overall  Finally going to church; Going to visit the shut in;  [place] singing in the choir; Thursday Friday; afternoon visitation;   [employer]; [phone] Sr. [place]; [phone] Call to discuss volunteer or paid (would love to have a job)  ? Sr. Work Tourist information centre manager; Worked at [employer]; [employer] program   Check out the [employer]; has friends out there that swim;  [employer] resource for food pantries available to people   These are the goals we discussed: Goals    . patient     Organize the house; the living room;  Organize small areas in home every day;   Goal: Set a goal        This is a list of the screening recommended for you and due dates:  Health Maintenance  Topic Date Due  . DEXA scan (bone density measurement)  03/31/2005  . Flu Shot  04/03/2016  . Tetanus Vaccine  08/13/2023  . Colon Cancer Screening  06/21/2024  . Shingles Vaccine  Completed  . Pneumonia vaccines  Completed     Bone Densitometry Bone densitometry is an imaging test that uses a special X-ray to measure the amount of calcium and other minerals in your bones (bone density). This test is also known as a bone mineral density test or dual-energy X-ray absorptiometry (DXA). The test can measure bone density at your hip and your spine. It is similar to having a regular X-ray. You may have this test to:  Diagnose a condition that causes weak or thin bones (osteoporosis).  Predict your risk of a broken bone  (fracture).  Determine how well osteoporosis treatment is working. LET [facility] CARE PROVIDER KNOW ABOUT:  Any allergies you have.  All medicines you are taking, including vitamins, herbs, eye drops, creams, and over-the-counter medicines.  Previous problems you or members of your family have had with the use of anesthetics.  Any blood disorders you have.  Previous surgeries you have had.  Medical conditions you have.  Possibility of pregnancy.  Any other medical test you had within the previous 14 days that used contrast material. RISKS AND COMPLICATIONS Generally, this is a safe procedure. However, problems can occur and may include the following:  This test exposes you to a very small amount of radiation.  The risks of radiation exposure may be greater to unborn children. BEFORE THE PROCEDURE  Do not take any calcium supplements for 24 hours before having the test. You can otherwise eat and drink what you usually do.  Take off all metal jewelry, eyeglasses, dental appliances, and any other metal objects. PROCEDURE  You may lie on an exam table. There will be an X-ray generator below you and an imaging device above you.  Other devices, such as boxes or braces, may be used to position your body properly for the scan.  You will need to lie still while the machine slowly scans your body.  The images will show up on a computer  monitor. AFTER THE PROCEDURE You may need more testing at a later time.   This information is not intended to replace advice given to you by your health care provider. Make sure you discuss any questions you have with your health care provider.   Document Released: 09/11/2004 Document Revised: 09/10/2014 Document Reviewed: 01/28/2014 Elsevier Interactive Patient Education 2016 [place] in the Home  Falls can cause injuries. They can happen to people of all ages. There are many things you can do to make your home safe and  to help prevent falls.  WHAT CAN I DO ON THE OUTSIDE OF MY HOME?  Regularly fix the edges of walkways and driveways and fix any cracks.  Remove anything that might make you trip as you walk through a door, such as a raised step or threshold.  Trim any bushes or trees on the path to your home.  Use bright outdoor lighting.  Clear any walking paths of anything that might make someone trip, such as rocks or tools.  Regularly check to see if handrails are loose or broken. Make sure that both sides of any steps have handrails.  Any raised decks and porches should have guardrails on the edges.  Have any leaves, snow, or ice cleared regularly.  Use sand or salt on walking paths during winter.  Clean up any spills in your garage right away. This includes oil or grease spills. WHAT CAN I DO IN THE BATHROOM?   Use night lights.  Install grab bars by the toilet and in the tub and shower. Do not use towel bars as grab bars.  Use non-skid mats or decals in the tub or shower.  If you need to sit down in the shower, use a plastic, non-slip stool.  Keep the floor dry. Clean up any water that spills on the floor as soon as it happens.  Remove soap buildup in the tub or shower regularly.  Attach bath mats securely with double-sided non-slip rug tape.  Do not have throw rugs and other things on the floor that can make you trip. WHAT CAN I DO IN THE BEDROOM?  Use night lights.  Make sure that you have a light by your bed that is easy to reach.  Do not use any sheets or blankets that are too big for your bed. They should not hang down onto the floor.  Have a firm chair that has side arms. You can use this for support while you get dressed.  Do not have throw rugs and other things on the floor that can make you trip. WHAT CAN I DO IN THE KITCHEN?  Clean up any spills right away.  Avoid walking on wet floors.  Keep items that you use a lot in easy-to-reach places.  If you need to  reach something above you, use a strong step stool that has a grab bar.  Keep electrical cords out of the way.  Do not use floor polish or wax that makes floors slippery. If you must use wax, use non-skid floor wax.  Do not have throw rugs and other things on the floor that can make you trip. WHAT CAN I DO WITH MY STAIRS?  Do not leave any items on the stairs.  Make sure that there are handrails on both sides of the stairs and use them. Fix handrails that are broken or loose. Make sure that handrails are as long as the stairways.  Check any carpeting to make sure  that it is firmly attached to the stairs. Fix any carpet that is loose or worn.  Avoid having throw rugs at the top or bottom of the stairs. If you do have throw rugs, attach them to the floor with carpet tape.  Make sure that you have a light switch at the top of the stairs and the bottom of the stairs. If you do not have them, ask someone to add them for you. WHAT ELSE CAN I DO TO HELP PREVENT FALLS?  Wear shoes that:  Do not have high heels.  Have rubber bottoms.  Are comfortable and fit you well.  Are closed at the toe. Do not wear sandals.  If you use a stepladder:  Make sure that it is fully opened. Do not climb a closed stepladder.  Make sure that both sides of the stepladder are locked into place.  Ask someone to hold it for you, if possible.  Clearly mark and make sure that you can see:  Any grab bars or handrails.  First and last steps.  Where the edge of each step is.  Use tools that help you move around (mobility aids) if they are needed. These include:  Canes.  Walkers.  Scooters.  Crutches.  Turn on the lights when you go into a dark area. Replace any light bulbs as soon as they burn out.  Set up your furniture so you have a clear path. Avoid moving your furniture around.  If any of your floors are uneven, fix them.  If there are any pets around you, be aware of where they  are.  Review your medicines with your doctor. Some medicines can make you feel dizzy. This can increase your chance of falling. Ask your doctor what other things that you can do to help prevent falls.   This information is not intended to replace advice given to you by your health care provider. Make sure you discuss any questions you have with your health care provider.   Document Released: 06/16/2009 Document Revised: 01/04/2015 Document Reviewed: 09/24/2014 Elsevier Interactive Patient Education 2016 [place] Maintenance, Female Adopting a healthy lifestyle and getting preventive care can go a long way to promote health and wellness. Talk with your health care provider about what schedule of regular examinations is right for you. This is a good chance for you to check in with your provider about disease prevention and staying healthy. In between checkups, there are plenty of things you can do on your own. Experts have done a lot of research about which lifestyle changes and preventive measures are most likely to keep you healthy. Ask your health care provider for more information. WEIGHT AND DIET  Eat a healthy diet  Be sure to include plenty of vegetables, fruits, low-fat dairy products, and lean protein.  Do not eat a lot of foods high in solid fats, added sugars, or salt.  Get regular exercise. This is one of the most important things you can do for your health.  Most adults should exercise for at least 150 minutes each week. The exercise should increase your heart rate and make you sweat (moderate-intensity exercise).  Most adults should also do strengthening exercises at least twice a week. This is in addition to the moderate-intensity exercise.  Maintain a healthy weight  Body mass index (BMI) is a measurement that can be used to identify possible weight problems. It estimates body fat based on height and weight. Your health care provider can help  determine your BMI  and help you achieve or maintain a healthy weight.  For females 29 years of age and older:   A BMI below 18.5 is considered underweight.  A BMI of 18.5 to 24.9 is normal.  A BMI of 25 to 29.9 is considered overweight.  A BMI of 30 and above is considered obese.  Watch levels of cholesterol and blood lipids  You should start having your blood tested for lipids and cholesterol at 76 years of age, then have this test every 5 years.  You may need to have your cholesterol levels checked more often if:  Your lipid or cholesterol levels are high.  You are older than 76 years of age.  You are at high risk for heart disease.  CANCER SCREENING   Lung Cancer  Lung cancer screening is recommended for adults 30-80 years old who are at high risk for lung cancer because of a history of smoking.  A yearly low-dose CT scan of the lungs is recommended for people who:  Currently smoke.  Have quit within the past 15 years.  Have at least a 30-pack-year history of smoking. A pack year is smoking an average of one pack of cigarettes a day for 1 year.  Yearly screening should continue until it has been 15 years since you quit.  Yearly screening should stop if you develop a health problem that would prevent you from having lung cancer treatment.  Breast Cancer  Practice breast self-awareness. This means understanding how your breasts normally appear and feel.  It also means doing regular breast self-exams. Let your health care provider know about any changes, no matter how small.  If you are in your 20s or 30s, you should have a clinical breast exam (CBE) by a health care provider every 1-3 years as part of a regular health exam.  If you are 3 or older, have a CBE every year. Also consider having a breast X-ray (mammogram) every year.  If you have a family history of breast cancer, talk to your health care provider about genetic screening.  If you are at high risk for breast cancer,  talk to your health care provider about having an MRI and a mammogram every year.  Breast cancer gene (BRCA) assessment is recommended for women who have family members with BRCA-related cancers. BRCA-related cancers include:  Breast.  Ovarian.  Tubal.  Peritoneal cancers.  Results of the assessment will determine the need for genetic counseling and BRCA1 and BRCA2 testing. Cervical Cancer Your health care provider may recommend that you be screened regularly for cancer of the pelvic organs (ovaries, uterus, and vagina). This screening involves a pelvic examination, including checking for microscopic changes to the surface of your cervix (Pap test). You may be encouraged to have this screening done every 3 years, beginning at age 32.  For women ages 78-65, health care providers may recommend pelvic exams and Pap testing every 3 years, or they may recommend the Pap and pelvic exam, combined with testing for human papilloma virus (HPV), every 5 years. Some types of HPV increase your risk of cervical cancer. Testing for HPV may also be done on women of any age with unclear Pap test results.  Other health care providers may not recommend any screening for nonpregnant women who are considered low risk for pelvic cancer and who do not have symptoms. Ask your health care provider if a screening pelvic exam is right for you.  If you have had past  treatment for cervical cancer or a condition that could lead to cancer, you need Pap tests and screening for cancer for at least 20 years after your treatment. If Pap tests have been discontinued, your risk factors (such as having a new sexual partner) need to be reassessed to determine if screening should resume. Some women have medical problems that increase the chance of getting cervical cancer. In these cases, your health care provider may recommend more frequent screening and Pap tests. Colorectal Cancer  This type of cancer can be detected and often  prevented.  Routine colorectal cancer screening usually begins at 76 years of age and continues through 76 years of age.  Your health care provider may recommend screening at an earlier age if you have risk factors for colon cancer.  Your health care provider may also recommend using home test kits to check for hidden blood in the stool.  A small camera at the end of a tube can be used to examine your colon directly (sigmoidoscopy or colonoscopy). This is done to check for the earliest forms of colorectal cancer.  Routine screening usually begins at age 9.  Direct examination of the colon should be repeated every 5-10 years through 76 years of age. However, you may need to be screened more often if early forms of precancerous polyps or small growths are found. Skin Cancer  Check your skin from head to toe regularly.  Tell your health care provider about any new moles or changes in moles, especially if there is a change in a mole's shape or color.  Also tell your health care provider if you have a mole that is larger than the size of a pencil eraser.  Always use sunscreen. Apply sunscreen liberally and repeatedly throughout the day.  Protect yourself by wearing long sleeves, pants, a wide-brimmed hat, and sunglasses whenever you are outside. HEART DISEASE, DIABETES, AND HIGH BLOOD PRESSURE   High blood pressure causes heart disease and increases the risk of stroke. High blood pressure is more likely to develop in:  People who have blood pressure in the high end of the normal range (130-139/85-89 mm Hg).  People who are overweight or obese.  People who are African American.  If you are 1-58 years of age, have your blood pressure checked every 3-5 years. If you are 68 years of age or older, have your blood pressure checked every year. You should have your blood pressure measured twice--once when you are at a hospital or clinic, and once when you are not at a hospital or clinic.  Record the average of the two measurements. To check your blood pressure when you are not at a hospital or clinic, you can use:  An automated blood pressure machine at a pharmacy.  A home blood pressure monitor.  If you are between 63 years and 10 years old, ask your health care provider if you should take aspirin to prevent strokes.  Have regular diabetes screenings. This involves taking a blood sample to check your fasting blood sugar level.  If you are at a normal weight and have a low risk for diabetes, have this test once every three years after 76 years of age.  If you are overweight and have a high risk for diabetes, consider being tested at a younger age or more often. PREVENTING INFECTION  Hepatitis B  If you have a higher risk for hepatitis B, you should be screened for this virus. You are considered at high risk for  hepatitis B if:  You were born in a country where hepatitis B is common. Ask your health care provider which countries are considered high risk.  Your parents were born in a high-risk country, and you have not been immunized against hepatitis B (hepatitis B vaccine).  You have HIV or AIDS.  You use needles to inject street drugs.  You live with someone who has hepatitis B.  You have had sex with someone who has hepatitis B.  You get hemodialysis treatment.  You take certain medicines for conditions, including cancer, organ transplantation, and autoimmune conditions. Hepatitis C  Blood testing is recommended for:  Everyone born from [birth year] through 1966.  Anyone with known risk factors for hepatitis C. Sexually transmitted infections (STIs)  You should be screened for sexually transmitted infections (STIs) including gonorrhea and chlamydia if:  You are sexually active and are younger than 76 years of age.  You are older than 76 years of age and your health care provider tells you that you are at risk for this type of infection.  Your sexual activity  has changed since you were last screened and you are at an increased risk for chlamydia or gonorrhea. Ask your health care provider if you are at risk.  If you do not have HIV, but are at risk, it may be recommended that you take a prescription medicine daily to prevent HIV infection. This is called pre-exposure prophylaxis (PrEP). You are considered at risk if:  You are sexually active and do not regularly use condoms or know the HIV status of your partner(s).  You take drugs by injection.  You are sexually active with a partner who has HIV. Talk with your health care provider about whether you are at high risk of being infected with HIV. If you choose to begin PrEP, you should first be tested for HIV. You should then be tested every 3 months for as long as you are taking PrEP.  PREGNANCY   If you are premenopausal and you may become pregnant, ask your health care provider about preconception counseling.  If you may become pregnant, take 400 to 800 micrograms (mcg) of folic acid every day.  If you want to prevent pregnancy, talk to your health care provider about birth control (contraception). OSTEOPOROSIS AND MENOPAUSE   Osteoporosis is a disease in which the bones lose minerals and strength with aging. This can result in serious bone fractures. Your risk for osteoporosis can be identified using a bone density scan.  If you are 63 years of age or older, or if you are at risk for osteoporosis and fractures, ask your health care provider if you should be screened.  Ask your health care provider whether you should take a calcium or vitamin D supplement to lower your risk for osteoporosis.  Menopause may have certain physical symptoms and risks.  Hormone replacement therapy may reduce some of these symptoms and risks. Talk to your health care provider about whether hormone replacement therapy is right for you.  HOME CARE INSTRUCTIONS   Schedule regular health, dental, and eye  exams.  Stay current with your immunizations.   Do not use any tobacco products including cigarettes, chewing tobacco, or electronic cigarettes.  If you are pregnant, do not drink alcohol.  If you are breastfeeding, limit how much and how often you drink alcohol.  Limit alcohol intake to no more than 1 drink per day for nonpregnant women. One drink equals 12 ounces of beer, 5 ounces of  wine, or 1 ounces of hard liquor.  Do not use street drugs.  Do not share needles.  Ask your health care provider for help if you need support or information about quitting drugs.  Tell your health care provider if you often feel depressed.  Tell your health care provider if you have ever been abused or do not feel safe at home.   This information is not intended to replace advice given to you by your health care provider. Make sure you discuss any questions you have with your health care provider.   Document Released: 03/05/2011 Document Revised: 09/10/2014 Document Reviewed: 07/22/2013 Elsevier Interactive Patient Education [employer].

## 2015-10-21 NOTE — Progress Notes (Signed)
Subjective:   [name] is a 75 y.o. female who presents for Medicare Annual (Subsequent) preventive examination.  Review of Systems:     HRA assessment completed during visit; Rzasa  The Patient was informed that this wellness visit is to identify risk and educate on how to reduce risk for increase disease through lifestyle changes.   ROS deferred to CPE exam with physician Here with spouse and  opted to have AWV this visit  Medical issues  HTN; fairly well controlled considering stress Went to [place] for hiatal hernia;  Reacted to medication; patient feels it the anesthetic; bad deams; pulled iv's out   Can't tolerate ambien either  Psychosocial; had 3 years of happy marriage prior to his illness Feels overwhelmed with caring for spouse and meeting her own needs;   BMI: 35  Diet; states she feels alone; spouse is difficult secondary to illness;  Dtr helps her; she is to tired to cook;  [employer] for breakfast and cooks for supper unless one of them is out.  Exercise limited due to caregiving; Typical day;  sleeps until she wakes up.  Fixes breakfast / Then we sit on the coach and watch TV Cooks supper unless someone goes out somewhere  Goes to bed;  Limited coping skills; goes to the movie; or to dinner with friend  Discussed going to the senior center for lunch;   Socialization limited  Finally going to church; states she would like a Psychologist, occupational job  Loves singing in the choir; Thursday Friday may volunteer to do afternoon visitation  Resources given which may be helpful [employer]; [phone] [employer] [place]; [phone] Call to discuss volunteer or paid (would love to have a job)  ? Sr. Work program; Worked at [employer] in the past [employer]; [employer] program   Check out the [employer]; has friends out there that swim; Loves to swim  [email or web address] food resources if needed    SAFETY; one level;  Safety reviewed for  the home;  Removal of clutter clearing paths through the home, discussed  Bathroom safety; is safe for both [employer] safety; Yes/ has an alarm system Smoke detectors yes Firearms safety / yes Driving accidents and seatbelt/ no Sun protection; yes Stressors' full time caregiver;  Working as caregiver; stopped x 2 months ago    Fall assessment had knee surgery x 75 yo/ doing well   Meds is taking both meds for Bladder issues   Mobilization and Functional losses in the last year./ about the same Sleep patterns/ takes alprazolam   Counseling: Colonoscopy; 06/2014 EKG: 06/2014 Mammogram 09/2015 Dexa; Needs dexa scan for baseline /requested she consider this year and given information   Hearing: 4000hz  both ears Ophthalmology exam; next week; no glau Immunizations / up to date   Current Care Team reviewed and updated     Objective:     Vitals: BP 144/80 mmHg  Ht 5\' 4"  (1.626 m)  Wt 207 lb 4 oz (94.008 kg)  BMI 35.56 kg/m2  Tobacco History  Smoking status  . Never Smoker   Smokeless tobacco  . Never Used     Counseling given: Not Answered   Past Medical History  Diagnosis Date  . Allergy   . Depression   . Hyperlipidemia   . Hypertension   . Anxiety   . Diverticulosis     PER COLONOSCOPY  . Heart murmur     "VERY SLIGHT ALL MY LIFE"  . Sleep apnea  does not tolerate CPAP - HAS NOT USED IN OVER 2 YRS  . GERD (gastroesophageal reflux disease)     OVER THE COUNTER MEDS IF NEEDED  . Arthritis     PAIN AND OA RIGHT KNEE; PAIN AND DDD AND SPINAL STENOSIS  . Complication of anesthesia     PULLED OUT MY IV'S AND FOLEY - HALLUCINATIONS WITH HIATAL HERNIA SURGERY   Past Surgical History  Procedure Laterality Date  . Lumbar laminectomy    . Lumbar fusion    . Hiatal hernia repair  2008  . Upper gastrointestinal endoscopy    . Colonoscopy    . Partial knee arthroplasty Right 07/02/2014    Procedure: RIGHT MEDIAL UNICOMPARTMENTAL KNEE;  Surgeon: Mauri Pole, MD;  Location: [facility];  Service: Orthopedics;  Laterality: Right;   Family History  Problem Relation Age of Onset  . Heart disease Mother   . Heart disease Father   . Diabetes Paternal Grandfather   . Heart disease Sister   . Pulmonary embolism Sister    History  Sexual Activity  . Sexual Activity: Yes    Outpatient Encounter Prescriptions as of 10/21/2015  Medication Sig  . ALPRAZolam (XANAX) 0.25 MG tablet TAKE 1 TABLET TWICE DAILY AS NEEDED FOR ANXIETY.  Marland Kitchen aspirin EC 325 MG EC tablet Take 1 tablet (325 mg total) by mouth 2 (two) times daily.  . calcium & magnesium carbonates (MYLANTA) [id number] MG tablet Take 1 tablet by mouth daily.  . Choline Fenofibrate (FENOFIBRIC ACID) 135 MG CPDR TAKE 1 CAPSULE AT BEDTIME.  Marland Kitchen losartan-hydrochlorothiazide (HYZAAR) 100-12.5 MG tablet TAKE 1 TABLET DAILY.  . mirabegron ER (MYRBETRIQ) 25 MG TB24 tablet Take 1 tablet (25 mg total) by mouth daily.  . mometasone (NASONEX) 50 MCG/ACT nasal spray Place 2 sprays into the nose at bedtime.  Marland Kitchen oxybutynin (DITROPAN-XL) 10 MG 24 hr tablet TAKE (1) TABLET DAILY AT BEDTIME.  . polyvinyl alcohol (LIQUIFILM TEARS) 1.4 % ophthalmic solution Place 1-2 drops into both eyes daily as needed for dry eyes.  . ranitidine (ZANTAC) 150 MG tablet Take 1 tablet (150 mg total) by mouth 2 (two) times daily.  Marland Kitchen tiZANidine (ZANAFLEX) 2 MG tablet TAKE 1 TABLET EVERY 6 HOURS AS NEEDED FOR MUSCLE SPASM.  Marland Kitchen verapamil (CALAN-SR) 240 MG CR tablet TAKE 1 TABLET TWICE DAILY.   No facility-administered encounter medications on file as of 10/21/2015.    Activities of Daily Living In your present state of health, do you have any difficulty performing the following activities: 10/21/2015  Hearing? N  Vision? N  Difficulty concentrating or making decisions? N  Walking or climbing stairs? N  Dressing or bathing? N  Doing errands, shopping? N  Preparing Food and eating ? N  Using the Toilet? N  In the past six months, have you  accidently leaked urine? Y  Do you have problems with loss of bowel control? N  Managing your Medications? N  Managing your Finances? N  Housekeeping or managing your Housekeeping? N    Patient Care Team: Hoyt Koch, MD as PCP - General (Internal Medicine)    Assessment:    Assessment   Patient presents for yearly preventative medicine examination. Medicare questionnaire screening were completed, i.e. Functional; fall risk; memory loss and hearing all unremarkable.  Depression; declined to complete the depression screen;  Brainstormed on what she can do to help herself; set boundaries in care giving role and feel better; energy low; states she lost her sister this past  year and is grieving over this; Has no one to talk too.  Agrees to make fup apt with Dr. Sharlet Salina if energy does not increase in the next 30 days; will pick a couple of things to do to get away and relax. Discussed counseling and agreed to consider; Discussed having someone assist her with change may be therapeutic and she will consider.   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 Will bring copy for chart  Exercise Activities and Dietary recommendations    Goals    . patient     Organize the house; the living room;  Organize small areas in home every day;   Goal: Set a goal       Fall Risk Fall Risk  10/21/2015  02/12/2014 04/03/2013  Falls in the past year? No No No  Risk for fall due to : - Impaired balance/gait;Impaired mobility;Medication side effect -   Depression Screen PHQ 2/9 Scores 10/21/2015 02/12/2014 04/03/2013  PHQ - 2 Score 1 1 1     See note above;   Cognitive Testing No flowsheet data found.  Recall may be limited but otherwise does not feel she had issues at this time.   Immunization History  Administered Date(s) Administered  . DT 08/12/2013  . Influenza Split 05/26/2012  . Influenza Whole 06/12/2007, 06/04/2008, 06/01/2009, 05/15/2011  . Influenza,inj,Quad PF,36+ Mos 06/05/2013, 06/10/2014, 05/13/2015  . Pneumococcal Conjugate-13 06/10/2014  . Pneumococcal Polysaccharide-23 06/12/2013  . Zoster 07/14/2007   Screening Tests Health Maintenance  Topic Date Due  . DEXA SCAN  03/31/2005  . INFLUENZA VACCINE  04/03/2016  . TETANUS/TDAP  08/13/2023  . COLONOSCOPY  06/21/2024  . ZOSTAVAX  Completed  . PNA vac Low Risk Adult  Completed      Plan:    During the course of the visit the patient was educated and counseled about the following appropriate screening and preventive services:   Vaccines to include Pneumoccal, Influenza, Hepatitis B, Td, Zostavax, HCV  Electrocardiogram 06/2014  Cardiovascular Disease/discussed HTN; diet is poor; weight is up and has low energy; educated on counseling and medication if needed;   Colorectal cancer screening/ 06/2014  Bone density screening/ due; request she consider this year  Diabetes screening/ pre-diabetes; did not have an opportunity to address at this assessment- will defer to Dr. Sharlet Salina as appropriate  Glaucoma screening/ eye exam scheduled for next week   Mammography; completed 09/2015  Nutrition counseling / hopefully will improve   Will call Dr. Sharlet Salina for apt in 30 days if energy is not improved  Patient Instructions (the written plan) was given to the patient.   Wynetta Fines, RN  10/21/2015

## 2015-10-23 NOTE — Progress Notes (Signed)
Medical screening examination/treatment/procedure(s) were performed by non-physician practitioner and as supervising physician I was immediately available for consultation/collaboration. I agree with above. Nora Sabey A Ramonte Mena, MD 

## 2015-10-31 ENCOUNTER — Other Ambulatory Visit: Payer: Self-pay | Admitting: Internal Medicine

## 2015-10-31 DIAGNOSIS — Z961 Presence of intraocular lens: Secondary | ICD-10-CM | POA: Diagnosis not present

## 2015-10-31 DIAGNOSIS — H353111 Nonexudative age-related macular degeneration, right eye, early dry stage: Secondary | ICD-10-CM | POA: Diagnosis not present

## 2015-11-16 ENCOUNTER — Other Ambulatory Visit: Payer: Self-pay

## 2015-11-16 ENCOUNTER — Telehealth: Payer: Self-pay

## 2015-11-16 MED ORDER — VERAPAMIL HCL ER 240 MG PO TBCR: 240.0000 mg | EXTENDED_RELEASE_TABLET | Freq: Two times a day (BID) | ORAL | Status: DC

## 2015-11-16 MED ORDER — TIZANIDINE HCL 2 MG PO TABS: ORAL_TABLET | ORAL | Status: DC

## 2015-11-16 MED ORDER — RANITIDINE HCL 150 MG PO TABS: 150.0000 mg | ORAL_TABLET | Freq: Two times a day (BID) | ORAL | Status: DC

## 2015-11-16 MED ORDER — LOSARTAN POTASSIUM-HCTZ 100-12.5 MG PO TABS: 1.0000 | ORAL_TABLET | Freq: Every day | ORAL | Status: DC

## 2015-11-16 NOTE — Telephone Encounter (Signed)
Recd faxed rx refill request for xanax from [facility]----please advise, thanks

## 2015-11-17 MED ORDER — ALPRAZOLAM 0.25 MG PO TABS
0.2500 mg | ORAL_TABLET | Freq: Two times a day (BID) | ORAL | Status: DC | PRN
Start: 2015-11-17 — End: 2015-11-24

## 2015-11-17 NOTE — Telephone Encounter (Signed)
Printed and signed.  

## 2015-11-17 NOTE — Addendum Note (Signed)
Addended by: Pricilla Holm A on: 11/17/2015 08:02 AM   Modules accepted: Orders

## 2015-11-17 NOTE — Telephone Encounter (Signed)
Faxed to [facility]

## 2015-11-23 ENCOUNTER — Other Ambulatory Visit: Payer: Self-pay | Admitting: Internal Medicine

## 2015-11-24 ENCOUNTER — Other Ambulatory Visit: Payer: Self-pay | Admitting: Internal Medicine

## 2015-11-24 NOTE — Telephone Encounter (Signed)
Sent to pharmacy 

## 2015-11-28 ENCOUNTER — Other Ambulatory Visit: Payer: Self-pay | Admitting: Internal Medicine

## 2015-12-23 ENCOUNTER — Other Ambulatory Visit: Payer: Self-pay | Admitting: Internal Medicine

## 2015-12-28 ENCOUNTER — Other Ambulatory Visit: Payer: Self-pay | Admitting: Internal Medicine

## 2016-01-04 ENCOUNTER — Other Ambulatory Visit: Payer: Self-pay | Admitting: Internal Medicine

## 2016-01-24 ENCOUNTER — Other Ambulatory Visit: Payer: Self-pay | Admitting: Internal Medicine

## 2016-02-10 ENCOUNTER — Other Ambulatory Visit: Payer: Self-pay | Admitting: Internal Medicine

## 2016-02-29 ENCOUNTER — Encounter: Payer: Self-pay | Admitting: Internal Medicine

## 2016-02-29 ENCOUNTER — Ambulatory Visit (INDEPENDENT_AMBULATORY_CARE_PROVIDER_SITE_OTHER): Payer: Medicare Other | Admitting: Internal Medicine

## 2016-02-29 ENCOUNTER — Other Ambulatory Visit (INDEPENDENT_AMBULATORY_CARE_PROVIDER_SITE_OTHER): Payer: Medicare Other

## 2016-02-29 VITALS — BP 116/70 | HR 75 | Temp 98.3°F | Resp 14 | Ht 63.0 in | Wt 206.0 lb

## 2016-02-29 DIAGNOSIS — E669 Obesity, unspecified: Secondary | ICD-10-CM

## 2016-02-29 DIAGNOSIS — I10 Essential (primary) hypertension: Secondary | ICD-10-CM

## 2016-02-29 DIAGNOSIS — E785 Hyperlipidemia, unspecified: Secondary | ICD-10-CM | POA: Diagnosis not present

## 2016-02-29 LAB — COMPREHENSIVE METABOLIC PANEL
ALT: 12 U/L (ref 0–35)
AST: 14 U/L (ref 0–37)
Albumin: 4.3 g/dL (ref 3.5–5.2)
Alkaline Phosphatase: 40 U/L (ref 39–117)
BUN: 18 mg/dL (ref 6–23)
CO2: 32 mEq/L (ref 19–32)
Calcium: 10 mg/dL (ref 8.4–10.5)
Chloride: 101 mEq/L (ref 96–112)
Creatinine, Ser: 0.7 mg/dL (ref 0.40–1.20)
GFR: 86.49 mL/min (ref >60.00)
Glucose, Bld: 149 mg/dL — ABNORMAL HIGH (ref 70–99)
Potassium: 3.9 mEq/L (ref 3.5–5.1)
SODIUM: 137 meq/L (ref 135–145)
Total Bilirubin: 0.5 mg/dL (ref 0.2–1.2)
Total Protein: 7.5 g/dL (ref 6.0–8.3)

## 2016-02-29 LAB — CBC
HCT: 42 % (ref 36.0–46.0)
HEMOGLOBIN: 14 g/dL (ref 12.0–15.0)
MCHC: 33.3 g/dL (ref 30.0–36.0)
MCV: 93.6 fl (ref 78.0–100.0)
Platelets: 314 10*3/uL (ref 150.0–400.0)
RBC: 4.49 Mil/uL (ref 3.87–5.11)
RDW: 13.3 % (ref 11.5–15.5)
WBC: 6.9 10*3/uL (ref 4.0–10.5)

## 2016-02-29 LAB — LIPID PANEL
Cholesterol: 164 mg/dL (ref 0–200)
HDL: 45.1 mg/dL (ref >39.00)
LDL Cholesterol: 95 mg/dL (ref 0–99)
NonHDL: 118.85
Total CHOL/HDL Ratio: 4
Triglycerides: 117 mg/dL (ref 0.0–149.0)
VLDL: 23.4 mg/dL (ref 0.0–40.0)

## 2016-02-29 LAB — HEMOGLOBIN A1C: Hgb A1c MFr Bld: 6.3 % (ref 4.6–6.5)

## 2016-02-29 MED ORDER — FENOFIBRIC ACID 135 MG PO CPDR: DELAYED_RELEASE_CAPSULE | ORAL | Status: DC

## 2016-02-29 NOTE — Progress Notes (Signed)
Pre visit review using our clinic review tool, if applicable. No additional management support is needed unless otherwise documented below in the visit note. 

## 2016-02-29 NOTE — Patient Instructions (Signed)
We will check the labs today and send the results on mychart.   We have sent in buspar for your husband that he can use for anxiety up to 3 times per day.

## 2016-03-02 NOTE — Progress Notes (Signed)
   Subjective:    Patient ID: [name], female    DOB: 11/04/1940, 75 y.o.   MRN: [id number]  HPI The patient is a 75 YO female coming in for follow up of her blood pressure (well controlled on her losartan/hctz and verapamil, not complicated) and her sugars (previously high and borderline HgA1c, weight the same, not exercising and diet mediocre). No new concerns or complaints but she thinks that her husband needs some anxiety medication and is asking about that multiple times during our visit.   Review of Systems  Constitutional: Negative for fever, activity change, appetite change, fatigue and unexpected weight change.  HENT: Negative for ear discharge, ear pain, sinus pressure and sore throat.   Respiratory: Negative for cough, chest tightness, shortness of breath and wheezing.   Cardiovascular: Negative for chest pain, palpitations and leg swelling.  Gastrointestinal: Positive for constipation. Negative for nausea, abdominal pain, diarrhea, blood in stool and abdominal distention.  Skin: Negative.   Neurological: Negative.       Objective:   Physical Exam  Constitutional: She is oriented to person, place, and time. She appears well-developed and well-nourished.  Overweight  HENT:  Head: Normocephalic and atraumatic.  Eyes: EOM are normal.  Neck: Normal range of motion.  Cardiovascular: Normal rate and regular rhythm.   Pulmonary/Chest: Effort normal and breath sounds normal. No respiratory distress. She has no wheezes. She has no rales.  Abdominal: Soft. Bowel sounds are normal. She exhibits no distension. There is no tenderness. There is no rebound.  Neurological: She is alert and oriented to person, place, and time. Coordination normal.  Skin: Skin is warm and dry.   Filed Vitals:   02/29/16 1123  BP: 116/70  Pulse: 75  Temp: 98.3 F (36.8 C)  TempSrc: Oral  Resp: 14  Height: 5\' 3"  (1.6 m)  Weight: 206 lb (93.441 kg)  SpO2: 97%      Assessment & Plan:

## 2016-03-02 NOTE — Assessment & Plan Note (Signed)
Checking CMP today and adjust as needed. BP at goal on verapamil and losartan/hctz.

## 2016-03-02 NOTE — Assessment & Plan Note (Signed)
Sugars have been elevated in the past and checking HgA1c today. Adjust as needed.

## 2016-03-02 NOTE — Assessment & Plan Note (Signed)
Checking lipid panel and adjust as needed. Taking no medicine right now.

## 2016-03-26 ENCOUNTER — Telehealth: Payer: Self-pay | Admitting: *Deleted

## 2016-03-26 MED ORDER — ALPRAZOLAM 0.25 MG PO TABS: 0.2500 mg | ORAL_TABLET | Freq: Two times a day (BID) | ORAL | 3 refills | Status: DC | PRN

## 2016-03-26 NOTE — Telephone Encounter (Signed)
Rx printed and signed.  

## 2016-03-26 NOTE — Telephone Encounter (Signed)
Faxed script back to [facility].../lmb 

## 2016-03-26 NOTE — Telephone Encounter (Signed)
Rec'd fax pt requesting refill 90 day supply on her alprazolam.../lmb

## 2016-04-10 ENCOUNTER — Other Ambulatory Visit: Payer: Self-pay | Admitting: *Deleted

## 2016-04-10 MED ORDER — TIZANIDINE HCL 2 MG PO TABS: ORAL_TABLET | ORAL | 0 refills | Status: DC

## 2016-04-20 ENCOUNTER — Other Ambulatory Visit: Payer: Self-pay | Admitting: Internal Medicine

## 2016-08-30 ENCOUNTER — Ambulatory Visit: Payer: Medicare Other | Admitting: Internal Medicine

## 2017-01-16 ENCOUNTER — Other Ambulatory Visit: Payer: Self-pay | Admitting: Internal Medicine

## 2018-03-18 ENCOUNTER — Telehealth: Payer: Self-pay

## 2018-03-18 ENCOUNTER — Ambulatory Visit: Payer: Medicare Other | Admitting: Internal Medicine

## 2018-03-18 DIAGNOSIS — Z0289 Encounter for other administrative examinations: Secondary | ICD-10-CM

## 2018-03-18 NOTE — Telephone Encounter (Signed)
FYI

## 2018-03-18 NOTE — Telephone Encounter (Signed)
Pt transferred care from Dr. Sharlet Salina at [facility] in 2017. After two No Show's, this patient should not be scheduled to re-establish with Dr. Sharlet Salina for any future visits. Scheduling blocked in Center.

## 2018-06-09 ENCOUNTER — Encounter: Payer: Self-pay | Admitting: Nurse Practitioner

## 2018-09-10 ENCOUNTER — Ambulatory Visit (INDEPENDENT_AMBULATORY_CARE_PROVIDER_SITE_OTHER): Payer: Medicare HMO | Admitting: Nurse Practitioner

## 2018-09-10 ENCOUNTER — Other Ambulatory Visit (INDEPENDENT_AMBULATORY_CARE_PROVIDER_SITE_OTHER): Payer: Medicare HMO

## 2018-09-10 ENCOUNTER — Ambulatory Visit (INDEPENDENT_AMBULATORY_CARE_PROVIDER_SITE_OTHER)
Admission: RE | Admit: 2018-09-10 | Discharge: 2018-09-10 | Disposition: A | Discharge status: Home / Self Care | Payer: Medicare HMO | Source: Ambulatory Visit | Attending: Nurse Practitioner | Admitting: Nurse Practitioner

## 2018-09-10 ENCOUNTER — Encounter (INDEPENDENT_AMBULATORY_CARE_PROVIDER_SITE_OTHER): Payer: Self-pay

## 2018-09-10 ENCOUNTER — Encounter: Payer: Self-pay | Admitting: Nurse Practitioner

## 2018-09-10 VITALS — BP 120/80 | HR 76 | Ht 63.0 in | Wt 183.0 lb

## 2018-09-10 DIAGNOSIS — I10 Essential (primary) hypertension: Secondary | ICD-10-CM | POA: Diagnosis not present

## 2018-09-10 DIAGNOSIS — J309 Allergic rhinitis, unspecified: Secondary | ICD-10-CM | POA: Diagnosis not present

## 2018-09-10 DIAGNOSIS — E785 Hyperlipidemia, unspecified: Secondary | ICD-10-CM

## 2018-09-10 DIAGNOSIS — Z13820 Encounter for screening for osteoporosis: Secondary | ICD-10-CM

## 2018-09-10 DIAGNOSIS — E669 Obesity, unspecified: Secondary | ICD-10-CM

## 2018-09-10 DIAGNOSIS — K219 Gastro-esophageal reflux disease without esophagitis: Secondary | ICD-10-CM

## 2018-09-10 DIAGNOSIS — N3281 Overactive bladder: Secondary | ICD-10-CM | POA: Diagnosis not present

## 2018-09-10 DIAGNOSIS — R7303 Prediabetes: Secondary | ICD-10-CM

## 2018-09-10 DIAGNOSIS — F411 Generalized anxiety disorder: Secondary | ICD-10-CM | POA: Diagnosis not present

## 2018-09-10 LAB — BASIC METABOLIC PANEL
BUN: 18 mg/dL (ref 6–23)
CHLORIDE: 103 meq/L (ref 96–112)
CO2: 26 mEq/L (ref 19–32)
Calcium: 9.5 mg/dL (ref 8.4–10.5)
Creatinine, Ser: 0.67 mg/dL (ref 0.40–1.20)
GFR: 90.37 mL/min (ref >60.00)
Glucose, Bld: 100 mg/dL — ABNORMAL HIGH (ref 70–99)
Potassium: 3.8 mEq/L (ref 3.5–5.1)
Sodium: 138 mEq/L (ref 135–145)

## 2018-09-10 NOTE — Patient Instructions (Addendum)
Head downstairs for labs  Schedule dexa scan on the way out today  I will plan to see you back in about 4-5 months to recheck your A1c.    Mediterranean Diet A Mediterranean diet refers to food and lifestyle choices that are based on the traditions of countries located on the [employer]. This way of eating has been shown to help prevent certain conditions and improve outcomes for people who have chronic diseases, like kidney disease and heart disease. What are tips for following this plan? Lifestyle  Cook and eat meals together with your family, when possible.  Drink enough fluid to keep your urine clear or pale yellow.  Be physically active every day. This includes: ? Aerobic exercise like running or swimming. ? Leisure activities like gardening, walking, or housework.  Get 7-8 hours of sleep each night.  If recommended by your health care provider, drink red wine in moderation. This means 1 glass a day for nonpregnant women and 2 glasses a day for men. A glass of wine equals 5 oz (150 mL). Reading food labels   Check the serving size of packaged foods. For foods such as rice and pasta, the serving size refers to the amount of cooked product, not dry.  Check the total fat in packaged foods. Avoid foods that have saturated fat or trans fats.  Check the ingredients list for added sugars, such as corn syrup. Shopping  At the grocery store, buy most of your food from the areas near the walls of the store. This includes: ? Fresh fruits and vegetables (produce). ? Grains, beans, nuts, and seeds. Some of these may be available in unpackaged forms or large amounts (in bulk). ? Fresh seafood. ? Poultry and eggs. ? Low-fat dairy products.  Buy whole ingredients instead of prepackaged foods.  Buy fresh fruits and vegetables in-season from local farmers markets.  Buy frozen fruits and vegetables in resealable bags.  If you do not have access to quality fresh seafood, buy  precooked frozen shrimp or canned fish, such as tuna, salmon, or sardines.  Buy small amounts of raw or cooked vegetables, salads, or olives from the deli or salad bar at your store.  Stock your pantry so you always have certain foods on hand, such as olive oil, canned tuna, canned tomatoes, rice, pasta, and beans. Cooking  Cook foods with extra-virgin olive oil instead of using butter or other vegetable oils.  Have meat as a side dish, and have vegetables or grains as your main dish. This means having meat in small portions or adding small amounts of meat to foods like pasta or stew.  Use beans or vegetables instead of meat in common dishes like chili or lasagna.  Experiment with different cooking methods. Try roasting or broiling vegetables instead of steaming or sauteing them.  Add frozen vegetables to soups, stews, pasta, or rice.  Add nuts or seeds for added healthy fat at each meal. You can add these to yogurt, salads, or vegetable dishes.  Marinate fish or vegetables using olive oil, lemon juice, garlic, and fresh herbs. Meal planning   Plan to eat 1 vegetarian meal one day each week. Try to work up to 2 vegetarian meals, if possible.  Eat seafood 2 or more times a week.  Have healthy snacks readily available, such as: ? Vegetable sticks with hummus. ? [place] yogurt. ? Fruit and nut trail mix.  Eat balanced meals throughout the week. This includes: ? Fruit: 2-3 servings a day ?  Vegetables: 4-5 servings a day ? Low-fat dairy: 2 servings a day ? Fish, poultry, or lean meat: 1 serving a day ? Beans and legumes: 2 or more servings a week ? Nuts and seeds: 1-2 servings a day ? Whole grains: 6-8 servings a day ? Extra-virgin olive oil: 3-4 servings a day  Limit red meat and sweets to only a few servings a month What are my food choices?  Mediterranean diet ? Recommended ? Grains: Whole-grain pasta. Brown rice. Bulgar wheat. Polenta. Couscous. Whole-wheat bread.  [place]. ? Vegetables: Artichokes. Beets. Broccoli. Cabbage. Carrots. Eggplant. Green beans. Chard. Kale. Spinach. Onions. Leeks. Peas. Squash. Tomatoes. Peppers. Radishes. ? Fruits: Apples. Apricots. Avocado. Berries. Bananas. Cherries. Dates. Figs. Grapes. Lemons. Melon. Oranges. Peaches. Plums. Pomegranate. ? Meats and other protein foods: Beans. Almonds. Sunflower seeds. Pine nuts. Peanuts. [place]. Salmon. Scallops. Shrimp. [place]. Tilapia. Clams. Oysters. Eggs. ? Dairy: Low-fat milk. Cheese. Greek yogurt. ? Beverages: Water. Red wine. Herbal tea. ? Fats and oils: Extra virgin olive oil. Avocado oil. Grape seed oil. ? Sweets and desserts: [place] yogurt with honey. Baked apples. Poached pears. Trail mix. ? Seasoning and other foods: Basil. Cilantro. Coriander. Cumin. Mint. Parsley. Sage. Rosemary. Tarragon. Garlic. Oregano. Thyme. Pepper. Balsalmic vinegar. Tahini. Hummus. Tomato sauce. Olives. Mushrooms. ? Limit these ? Grains: Prepackaged pasta or rice dishes. Prepackaged cereal with added sugar. ? Vegetables: Deep fried potatoes (french fries). ? Fruits: Fruit canned in syrup. ? Meats and other protein foods: Beef. Pork. Lamb. Poultry with skin. Hot dogs. Berniece Salines. ? Dairy: Ice cream. Sour cream. Whole milk. ? Beverages: Juice. Sugar-sweetened soft drinks. Beer. Liquor and spirits. ? Fats and oils: Butter. Canola oil. Vegetable oil. Beef fat (tallow). Lard. ? Sweets and desserts: Cookies. Cakes. Pies. Candy. ? Seasoning and other foods: Mayonnaise. Premade sauces and marinades. ? The items listed may not be a complete list. Talk with your dietitian about what dietary choices are right for you. Summary  The Mediterranean diet includes both food and lifestyle choices.  Eat a variety of fresh fruits and vegetables, beans, nuts, seeds, and whole grains.  Limit the amount of red meat and sweets that you eat.  Talk with your health care provider about whether it is safe for you to drink  red wine in moderation. This means 1 glass a day for nonpregnant women and 2 glasses a day for men. A glass of wine equals 5 oz (150 mL). This information is not intended to replace advice given to you by your health care provider. Make sure you discuss any questions you have with your health care provider. Document Released: 04/12/2016 Document Revised: 05/15/2016 Document Reviewed: 04/12/2016 Elsevier Interactive Patient Education  2019 [employer].

## 2018-09-10 NOTE — Progress Notes (Signed)
[name] is a 78 y.o. female with the following history as recorded in EpicCare:  Patient Active Problem List   Diagnosis Date Noted  . GERD (gastroesophageal reflux disease) 05/13/2015  . Routine general medical examination at a health care facility 12/23/2014  . Generalized anxiety disorder 08/20/2014  . Obesity (BMI 30-39.9) 02/12/2014  . Hyperlipidemia 04/17/2007  . Essential hypertension 04/17/2007  . Allergic rhinitis 04/17/2007  . CYST/PSEUDOCYST, PANCREAS 04/17/2007  . Osteoarthritis 04/17/2007    Current Outpatient Medications  Medication Sig Dispense Refill  . acetaminophen (TYLENOL 8 HOUR) 650 MG CR tablet Take 650 mg by mouth every 8 (eight) hours as needed for pain.    Marland Kitchen ALPRAZolam (XANAX) 0.25 MG tablet Take 1 tablet (0.25 mg total) by mouth 2 (two) times daily as needed for anxiety. 60 tablet 3  . amLODipine (NORVASC) 5 MG tablet Take 5 mg by mouth daily.    . calcium & magnesium carbonates (MYLANTA) 295-188 MG tablet Take 1 tablet by mouth daily.    . famotidine (PEPCID) 20 MG tablet Take 20 mg by mouth daily.    Marland Kitchen lisinopril (PRINIVIL,ZESTRIL) 5 MG tablet Take 5 mg by mouth 2 (two) times daily.    . mometasone (NASONEX) 50 MCG/ACT nasal spray Place 2 sprays into the nose at bedtime. 17 g 6  . oxybutynin (DITROPAN-XL) 10 MG 24 hr tablet TAKE (1) TABLET DAILY AT BEDTIME. 30 tablet 0  . polyvinyl alcohol (LIQUIFILM TEARS) 1.4 % ophthalmic solution Place 1-2 drops into both eyes daily as needed for dry eyes.    . simvastatin (ZOCOR) 20 MG tablet Take 20 mg by mouth daily.     No current facility-administered medications for this visit.     Allergies: Ambien [zolpidem tartrate]; Hydrochlorothiazide; Prednisone; Thiazide-type diuretics; and Sulfamethoxazole  Past Medical History:  Diagnosis Date  . Allergy   . Anxiety   . Arthritis    PAIN AND OA RIGHT KNEE; PAIN AND DDD AND SPINAL STENOSIS  . Complication of anesthesia    PULLED OUT MY IV'S AND FOLEY -  HALLUCINATIONS WITH HIATAL HERNIA SURGERY  . Depression   . Diverticulosis    PER COLONOSCOPY  . GERD (gastroesophageal reflux disease)    OVER THE COUNTER MEDS IF NEEDED  . Heart murmur    "VERY SLIGHT ALL MY LIFE"  . Hyperlipidemia   . Hypertension   . Sleep apnea    does not tolerate CPAP - HAS NOT USED IN OVER 2 YRS    Past Surgical History:  Procedure Laterality Date  . COLONOSCOPY    . HIATAL HERNIA REPAIR  2008  . LUMBAR FUSION    . LUMBAR LAMINECTOMY    . PARTIAL KNEE ARTHROPLASTY Right 07/02/2014   Procedure: RIGHT MEDIAL UNICOMPARTMENTAL KNEE;  Surgeon: Mauri Pole, MD;  Location: [facility];  Service: Orthopedics;  Laterality: Right;  . UPPER GASTROINTESTINAL ENDOSCOPY      Family History  Problem Relation Age of Onset  . Heart disease Mother   . Heart disease Father   . Heart disease Sister   . Pulmonary embolism Sister   . Diabetes Paternal Grandfather     Social History   Tobacco Use  . Smoking status: Never Smoker  . Smokeless tobacco: Never Used  Substance Use Topics  . Alcohol use: No     Subjective:  [name] is here today to establish care as a transfer, was seen in our practice until 2017, then saw another primary provider in another practice  until now, she says she "just wanted a change" so decided to come back to [facility]. We will review her current medication list and chronic conditions today. We will also update DEXA testing, overdue  Anxiety-maintained on xanax 0.25- 2 tabs nightly for sleep, which she has been on for sometime, feels okay during the day but worries at night and xanax helps her feel calmer and fall asleep. 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 She has not noted any adverse effects to the xanax- falls, memory loss She was on ambien in the past which did not help her sleep and she had adverse effects so she was switched to  xanax  Hypertension -maintained on amlodipine 5, lisinopril 5 bid Reports daily medication compliance without noted adverse medication effects. Reports she does not check her bp at home routinely.  Denies headaches, vision changes, chest pain, shortness of breath, edema.  BP Readings from Last 3 Encounters:  09/10/18 120/80  02/29/16 116/70  10/21/15 (!) 144/80   Acid reflux-maintained on pepcid daily with good control of acid reflux, does notice symptoms intermittently if she does not take pepcid but no noted GERD while taking daily. No dysphagia, hoarseness, nausea, vomiting.  Allergies- maintained on nasonex as needed With good control of allergy symptoms   Overactive bladder- maintained on oxybutynin daily for OAB with good control of her OAB symptoms- no fevers, chills, abdominal pain, dysuria, hematuria  HLD- maintained on simvastatin 20 daily Reports daily medication compliance without noted adverse medication effects including nausea, myalgias.  Lab Results  Component Value Date   CHOL 164 02/29/2016   HDL 45.10 02/29/2016   LDLCALC 95 02/29/2016   LDLDIRECT 125.1 12/13/2009   TRIG 117.0 02/29/2016   CHOLHDL 4 02/29/2016    ROS - See HPI  Objective:  Vitals:   09/10/18 1337  BP: 120/80  Pulse: 76  SpO2: 97%  Weight: 183 lb (83 kg)  Height: 5\' 3"  (1.6 m)  Body mass index is 32.42 kg/m.    General: Well developed, well nourished, in no acute distress  Skin : Warm and dry.  Head: Normocephalic and atraumatic  Eyes: Sclera and conjunctiva clear; pupils round and reactive to light; extraocular movements intact  Oropharynx: Pink, supple. No suspicious lesions  Neck: Supple without thyromegaly Lungs: Respirations unlabored; clear to auscultation bilaterally without wheeze, rales, rhonchi  CVS exam: normal rate and regular rhythm, S1 and S2 normal.  Musculoskeletal: No deformities; no active joint inflammation  Extremities: No edema, cyanosis, clubbing  Vessels:  Symmetric bilaterally  Neurologic: Alert and oriented; speech intact; face symmetrical; moves all extremities well; CNII-XII intact without focal deficit  Psychiatric: Normal mood and affect.  Assessment:  1. Essential hypertension   2. Allergic rhinitis, unspecified seasonality, unspecified trigger   3. Gastroesophageal reflux disease, esophagitis presence not specified   4. Hyperlipidemia, unspecified hyperlipidemia type   5. Obesity (BMI 30-39.9)   6. Generalized anxiety disorder   7. Pre-diabetes   8. Overactive bladder   9. Screening for osteoporosis     Plan:  Pt provided labs ordered by prior provider on 06/09/18, which I reviewed today, will scan into media. 06/09/18 labs showed pre-diabetes, stable cholesterol, elevated glucose, elevated BUN/creatinine ratio noted, otherwise labs were WNL  Screening for osteoporosis Dexa overdue, recommended, she is agreeable - DG Bone Density; Future  Return in about 5 months (around 02/09/2019) for F/U- Prediabetes- reck a1C;  HLD- recheck lipid panel for LDL.  Orders Placed This Encounter  Procedures  . DG Bone Density    Standing Status:   Future    Number of Occurrences:   1    Standing Expiration Date:   11/09/2019    Order Specific Question:   Reason for Exam (SYMPTOM  OR DIAGNOSIS REQUIRED)    Answer:   screening    Order Specific Question:   Preferred imaging location?    Answer:   [facility]  . Basic metabolic panel    Standing Status:   Future    Number of Occurrences:   1    Standing Expiration Date:   09/11/2019    Requested Prescriptions    No prescriptions requested or ordered in this encounter

## 2018-09-11 DIAGNOSIS — R7303 Prediabetes: Secondary | ICD-10-CM | POA: Insufficient documentation

## 2018-09-11 DIAGNOSIS — N3281 Overactive bladder: Secondary | ICD-10-CM | POA: Insufficient documentation

## 2018-09-11 NOTE — Assessment & Plan Note (Signed)
Discussed the role of healthy diet and exercise in the management of obesity, chronic medical conditions, additional education provided on AVS

## 2018-09-11 NOTE — Assessment & Plan Note (Signed)
Stable Continue current medications Continue to monitor 

## 2018-09-11 NOTE — Assessment & Plan Note (Deleted)
Recent lipid panel stable Discussed the role of healthy diet and exercise in the management of HLD, additional education provided on AVS Continue statin at current dosage

## 2018-09-11 NOTE — Assessment & Plan Note (Addendum)
Recent lipid panel stable Discussed the role of healthy diet and exercise in the management of HLD, additional education provided on AVS Continue statin at current dosage

## 2018-09-11 NOTE — Assessment & Plan Note (Signed)
06/09/18 A1c 5.9 Discussed the role of healthy diet and exercise in the management of pre-diabetes and additional education provided on AVS Declines nutrition referral  RTC in 4-5 months for F/U- recheck X8V - Basic metabolic panel; Future

## 2018-09-11 NOTE — Assessment & Plan Note (Signed)
Stable Continue current medications - Basic metabolic panel; Future

## 2018-09-11 NOTE — Assessment & Plan Note (Signed)
Stable Continue current medications F/u for new, worsening symptoms

## 2018-09-11 NOTE — Assessment & Plan Note (Addendum)
Discussed long term risks of benzodiazepines including memory loss, falls, she would like to continue xanax at current dosage, will not increase dosage but continue at bedtime only She does not need refill right now, will notify me when refill is requested F/u for new, worsening symptoms

## 2018-09-11 NOTE — Assessment & Plan Note (Signed)
Stable Continue pepcid F/U for new, worsening symptoms

## 2018-09-15 ENCOUNTER — Other Ambulatory Visit: Payer: Self-pay | Admitting: Nurse Practitioner

## 2018-09-15 DIAGNOSIS — M810 Age-related osteoporosis without current pathological fracture: Secondary | ICD-10-CM

## 2018-11-18 ENCOUNTER — Other Ambulatory Visit: Payer: Self-pay | Admitting: Nurse Practitioner

## 2018-11-19 NOTE — Telephone Encounter (Signed)
[place] Controlled Database Checked Last filled: 10/27/18 # 20 Rx'd by Lorene Dy on 06/02/18 LOV w/you: 09/10/18 Next appt w/you: 03/11/19

## 2018-12-05 ENCOUNTER — Encounter: Payer: Self-pay | Admitting: Family Medicine

## 2018-12-10 ENCOUNTER — Telehealth: Payer: Self-pay | Admitting: *Deleted

## 2018-12-10 ENCOUNTER — Ambulatory Visit: Payer: Medicare HMO | Admitting: Family Medicine

## 2018-12-10 ENCOUNTER — Other Ambulatory Visit: Payer: Self-pay

## 2018-12-10 NOTE — Progress Notes (Unsigned)
Pt contacted via phone as webex was not working.  Pt attempting to transfer care, however was unaware this provider does not prescribe certain medications.  Pt requesting refills on xanax and hydrocodone.

## 2018-12-10 NOTE — Telephone Encounter (Signed)
Patient is requesting a TOC/New patient appointment with Dr Jerilee Hoh.  Patient is aware that all of her medications may not be filled. Okay to schedule?

## 2018-12-10 NOTE — Telephone Encounter (Signed)
Yes, but no med refills until I have seen her

## 2018-12-11 ENCOUNTER — Encounter: Payer: Self-pay | Admitting: Internal Medicine

## 2018-12-11 NOTE — Telephone Encounter (Signed)
Appointment scheduled.

## 2018-12-16 ENCOUNTER — Encounter: Payer: Self-pay | Admitting: Internal Medicine

## 2018-12-16 ENCOUNTER — Other Ambulatory Visit: Payer: Self-pay

## 2018-12-16 ENCOUNTER — Ambulatory Visit (INDEPENDENT_AMBULATORY_CARE_PROVIDER_SITE_OTHER): Payer: Medicare HMO | Admitting: Internal Medicine

## 2018-12-16 DIAGNOSIS — E785 Hyperlipidemia, unspecified: Secondary | ICD-10-CM | POA: Diagnosis not present

## 2018-12-16 DIAGNOSIS — I10 Essential (primary) hypertension: Secondary | ICD-10-CM

## 2018-12-16 DIAGNOSIS — Z79899 Other long term (current) drug therapy: Secondary | ICD-10-CM

## 2018-12-16 DIAGNOSIS — G894 Chronic pain syndrome: Secondary | ICD-10-CM

## 2018-12-16 DIAGNOSIS — F411 Generalized anxiety disorder: Secondary | ICD-10-CM | POA: Diagnosis not present

## 2018-12-16 DIAGNOSIS — K219 Gastro-esophageal reflux disease without esophagitis: Secondary | ICD-10-CM

## 2018-12-16 DIAGNOSIS — R7303 Prediabetes: Secondary | ICD-10-CM | POA: Diagnosis not present

## 2018-12-16 DIAGNOSIS — M150 Primary generalized (osteo)arthritis: Secondary | ICD-10-CM | POA: Diagnosis not present

## 2018-12-16 DIAGNOSIS — N3281 Overactive bladder: Secondary | ICD-10-CM

## 2018-12-16 DIAGNOSIS — M159 Polyosteoarthritis, unspecified: Secondary | ICD-10-CM

## 2018-12-16 MED ORDER — HYDROCODONE-ACETAMINOPHEN 5-325 MG PO TABS: 1.0000 | ORAL_TABLET | Freq: Three times a day (TID) | ORAL | 0 refills | Status: DC | PRN

## 2018-12-16 MED ORDER — ALPRAZOLAM 0.25 MG PO TABS: 0.2500 mg | ORAL_TABLET | Freq: Two times a day (BID) | ORAL | 2 refills | Status: DC | PRN

## 2018-12-16 NOTE — Progress Notes (Signed)
Virtual Visit via Video Note  I connected with [name] on 12/16/18 at 10:00 AM EDT by a video enabled telemedicine application and verified that I am speaking with the correct person using two identifiers.  Location patient: home Location provider: work office Persons participating in the virtual visit: patient, provider  I discussed the limitations of evaluation and management by telemedicine and the availability of in person appointments. The patient expressed understanding and agreed to proceed.   HPI: This visit is to establish care, follow-up on chronic medical conditions and for medication refills.  Past medical history significant for:  1.  Hypertension for which she takes amlodipine and lisinopril, she does not have a blood pressure cuff and has not checked blood pressure recently.  2.  Obstructive sleep apnea but she is intolerant of CPAP.  3.  GERD for which she takes over-the-counter PPI daily.  4.  Overactive bladder for which she takes daily oxybutynin  5.  Hyperlipidemia for which she no longer takes medications.  6.  Depression/anxiety.  She has been on alprazolam 0.25 mg twice daily for at least 8 to 10 years.  7.  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 Alvan Dame 3 years ago.  She has been maintained on hydrocodone 5/325 mg 1 tablet 3 times a day by her prior PCP.  8.  Impaired glucose tolerance which is currently diet-controlled.  She states she had a physical with her prior PCP in September with blood work, none that I can see in the system.  She is in need of her Xanax and hydrocodone refills today, no other complaints.   ROS: Constitutional: Denies fever, chills, diaphoresis, appetite change and fatigue.  HEENT: Denies photophobia, eye pain, redness, hearing loss, ear pain, congestion, sore throat, rhinorrhea, sneezing, mouth sores, trouble swallowing, neck pain, neck stiffness and tinnitus.    Respiratory: Denies SOB, DOE, cough, chest tightness,  and wheezing.   Cardiovascular: Denies chest pain, palpitations and leg swelling.  Gastrointestinal: Denies nausea, vomiting, abdominal pain, diarrhea, constipation, blood in stool and abdominal distention.  Genitourinary: Denies dysuria, urgency, frequency, hematuria, flank pain and difficulty urinating.  Endocrine: Denies: hot or cold intolerance, sweats, changes in hair or nails, polyuria, polydipsia. Musculoskeletal: Denies myalgias, back pain, joint swelling, arthralgias and gait problem.  Skin: Denies pallor, rash and wound.  Neurological: Denies dizziness, seizures, syncope, weakness, light-headedness, numbness and headaches.  Hematological: Denies adenopathy. Easy bruising, personal or family bleeding history  Psychiatric/Behavioral: Denies suicidal ideation, mood changes, confusion, nervousness, sleep disturbance and agitation   Past Medical History:  Diagnosis Date  . Allergy   . Anxiety   . Arthritis    PAIN AND OA RIGHT KNEE; PAIN AND DDD AND SPINAL STENOSIS  . Complication of anesthesia    PULLED OUT MY IV'S AND FOLEY - HALLUCINATIONS WITH HIATAL HERNIA SURGERY  . Depression   . Diverticulosis    PER COLONOSCOPY  . GERD (gastroesophageal reflux disease)    OVER THE COUNTER MEDS IF NEEDED  . Heart murmur    "VERY SLIGHT ALL MY LIFE"  . Hyperlipidemia   . Hypertension   . Sleep apnea    does not tolerate CPAP - HAS NOT USED IN OVER 2 YRS    Past Surgical History:  Procedure Laterality Date  . COLONOSCOPY    . HIATAL HERNIA REPAIR  2008  . LUMBAR FUSION    . LUMBAR LAMINECTOMY    . PARTIAL  KNEE ARTHROPLASTY Right 07/02/2014   Procedure: RIGHT MEDIAL UNICOMPARTMENTAL KNEE;  Surgeon: Mauri Pole, MD;  Location: [facility];  Service: Orthopedics;  Laterality: Right;  . UPPER GASTROINTESTINAL ENDOSCOPY      Family History  Problem Relation Age of Onset  . Heart disease Mother   . Heart disease Father   .  Heart disease Sister   . Pulmonary embolism Sister   . Diabetes Paternal Grandfather     SOCIAL HX:   reports that she has never smoked. She has never used smokeless tobacco. She reports that she does not drink alcohol or use drugs.   Current Outpatient Medications:  .  acetaminophen (TYLENOL 8 HOUR) 650 MG CR tablet, Take 650 mg by mouth every 8 (eight) hours as needed for pain., Disp: , Rfl:  .  ALPRAZolam (XANAX) 0.25 MG tablet, Take 1 tablet (0.25 mg total) by mouth 2 (two) times daily as needed for anxiety., Disp: 60 tablet, Rfl: 2 .  amLODipine (NORVASC) 5 MG tablet, Take 5 mg by mouth daily., Disp: , Rfl:  .  calcium & magnesium carbonates (MYLANTA) 311-232 MG tablet, Take 1 tablet by mouth daily., Disp: , Rfl:  .  famotidine (PEPCID) 20 MG tablet, Take 20 mg by mouth daily., Disp: , Rfl:  .  HYDROcodone-acetaminophen (NORCO/VICODIN) 5-325 MG tablet, Take 1 tablet by mouth 3 (three) times daily as needed for moderate pain., Disp: 90 tablet, Rfl: 0 .  HYDROcodone-acetaminophen (NORCO/VICODIN) 5-325 MG tablet, Take 1 tablet by mouth 3 (three) times daily as needed for moderate pain., Disp: 90 tablet, Rfl: 0 .  HYDROcodone-acetaminophen (NORCO/VICODIN) 5-325 MG tablet, Take 1 tablet by mouth 3 (three) times daily as needed for moderate pain., Disp: 90 tablet, Rfl: 0 .  lisinopril (PRINIVIL,ZESTRIL) 5 MG tablet, Take 5 mg by mouth 2 (two) times daily., Disp: , Rfl:  .  mometasone (NASONEX) 50 MCG/ACT nasal spray, Place 2 sprays into the nose at bedtime., Disp: 17 g, Rfl: 6 .  oxybutynin (DITROPAN-XL) 10 MG 24 hr tablet, TAKE (1) TABLET DAILY AT BEDTIME., Disp: 30 tablet, Rfl: 0 .  polyvinyl alcohol (LIQUIFILM TEARS) 1.4 % ophthalmic solution, Place 1-2 drops into both eyes daily as needed for dry eyes., Disp: , Rfl:  .  simvastatin (ZOCOR) 20 MG tablet, Take 20 mg by mouth daily., Disp: , Rfl:   EXAM:   VITALS per patient if applicable: none reported  GENERAL: alert, oriented, appears  well and in no acute distress  HEENT: atraumatic, conjunttiva clear, no obvious abnormalities on inspection of external nose and ears  NECK: normal movements of the head and neck  LUNGS: on inspection no signs of respiratory distress, breathing rate appears normal, no obvious gross increased work of breathing, gasping or wheezing  CV: no obvious cyanosis  MS: moves all visible extremities without noticeable abnormality  PSYCH/NEURO: pleasant and cooperative, no obvious depression or anxiety, speech and thought processing grossly intact  ASSESSMENT AND PLAN:   Hyperlipidemia, unspecified hyperlipidemia type -Currently not on medications, will need to check lipids at next physical.  Essential hypertension -On Norvasc and lisinopril, advised ambulatory blood pressure monitoring.  Gastroesophageal reflux disease, esophagitis presence not specified -Symptoms well controlled on daily over-the-counter PPI.  Pre-diabetes -Currently she is diet controlled, check A1c next visit.  Overactive bladder -Takes daily oxybutynin  Primary osteoarthritis involving multiple joints Chronic pain syndrome  High risk medication use -I have agreed to become the primary prescriber for her hydrocodone. -PDMP has been reviewed today, no  red flags overdose risk score is 5 0. -She will need to have a pain management contract signed at next in person office visit. -Will refill 90 tablets of hydrocodone 5/325 mg for 3 months.  Generalized anxiety disorder -Plan alprazolam 0.25 mg twice daily which she has taken for over 10 years. -I have agreed to assume care of this prescription.  PDMP has been reviewed as above.    I discussed the assessment and treatment plan with the patient. The patient was provided an opportunity to ask questions and all were answered. The patient agreed with the plan and demonstrated an understanding of the instructions.   The patient was advised to call back or seek an  in-person evaluation if the symptoms worsen or if the condition fails to improve as anticipated.    Lelon Frohlich, MD  [facility]

## 2018-12-16 NOTE — Telephone Encounter (Signed)
Spoke with pharmacist and Hydrocodone prescription will need a prior authorization.

## 2018-12-17 NOTE — Telephone Encounter (Signed)
Prior auth for Hydrocodone 5/325mg  #90 sent to [email or web address] [id number].  Note received states the medication is available without authorization.  I called [employer] and informed Nira Conn of this and she stated she will try to run the Rx through again and call back if needed.

## 2018-12-18 ENCOUNTER — Ambulatory Visit: Payer: Medicare HMO | Admitting: Family Medicine

## 2019-01-09 ENCOUNTER — Encounter: Payer: Self-pay | Admitting: Internal Medicine

## 2019-01-25 ENCOUNTER — Other Ambulatory Visit: Payer: Self-pay | Admitting: Internal Medicine

## 2019-01-25 DIAGNOSIS — G894 Chronic pain syndrome: Secondary | ICD-10-CM

## 2019-01-25 DIAGNOSIS — Z79899 Other long term (current) drug therapy: Secondary | ICD-10-CM

## 2019-01-27 NOTE — Telephone Encounter (Signed)
Spoke with patient and asked her to call the pharmacy to see if refills are available.

## 2019-01-30 DIAGNOSIS — Z96651 Presence of right artificial knee joint: Secondary | ICD-10-CM | POA: Diagnosis not present

## 2019-01-30 DIAGNOSIS — M25562 Pain in left knee: Secondary | ICD-10-CM | POA: Diagnosis not present

## 2019-01-30 DIAGNOSIS — Z471 Aftercare following joint replacement surgery: Secondary | ICD-10-CM | POA: Diagnosis not present

## 2019-02-05 ENCOUNTER — Other Ambulatory Visit: Payer: Self-pay | Admitting: Internal Medicine

## 2019-03-01 ENCOUNTER — Encounter: Payer: Self-pay | Admitting: Internal Medicine

## 2019-03-02 ENCOUNTER — Telehealth: Payer: Self-pay | Admitting: Internal Medicine

## 2019-03-02 NOTE — Telephone Encounter (Signed)
Medication: HYDROcodone-acetaminophen (NORCO/VICODIN) 5-325 MG tablet and simvastatin (ZOCOR) 20 MG tablet  Has the patient contacted their pharmacy? yes  Preferred Pharmacy (with phone number or street name):  [facility], [place]. [phone] (Phone) [phone] (Fax)   Agent: Please be advised that RX refills may take up to 3 business days. We ask that you follow-up with your pharmacy.

## 2019-03-03 NOTE — Telephone Encounter (Signed)
Spoke with patient and prescription will be filled today.

## 2019-03-05 NOTE — Telephone Encounter (Signed)
Rx was picked up 03/03/2019

## 2019-03-11 ENCOUNTER — Ambulatory Visit: Payer: Medicare HMO | Admitting: Nurse Practitioner

## 2019-03-13 DIAGNOSIS — D1801 Hemangioma of skin and subcutaneous tissue: Secondary | ICD-10-CM | POA: Diagnosis not present

## 2019-03-13 DIAGNOSIS — L821 Other seborrheic keratosis: Secondary | ICD-10-CM | POA: Diagnosis not present

## 2019-03-13 DIAGNOSIS — L304 Erythema intertrigo: Secondary | ICD-10-CM | POA: Diagnosis not present

## 2019-03-13 DIAGNOSIS — D229 Melanocytic nevi, unspecified: Secondary | ICD-10-CM | POA: Diagnosis not present

## 2019-03-13 DIAGNOSIS — L739 Follicular disorder, unspecified: Secondary | ICD-10-CM | POA: Diagnosis not present

## 2019-03-13 DIAGNOSIS — L218 Other seborrheic dermatitis: Secondary | ICD-10-CM | POA: Diagnosis not present

## 2019-03-29 ENCOUNTER — Encounter: Payer: Self-pay | Admitting: Internal Medicine

## 2019-03-30 ENCOUNTER — Other Ambulatory Visit: Payer: Self-pay | Admitting: Internal Medicine

## 2019-03-30 DIAGNOSIS — F411 Generalized anxiety disorder: Secondary | ICD-10-CM

## 2019-04-16 ENCOUNTER — Encounter: Payer: Self-pay | Admitting: Internal Medicine

## 2019-04-17 ENCOUNTER — Other Ambulatory Visit: Payer: Self-pay | Admitting: Internal Medicine

## 2019-04-17 DIAGNOSIS — Z79899 Other long term (current) drug therapy: Secondary | ICD-10-CM

## 2019-04-17 DIAGNOSIS — G894 Chronic pain syndrome: Secondary | ICD-10-CM

## 2019-04-20 ENCOUNTER — Telehealth: Payer: Self-pay | Admitting: Internal Medicine

## 2019-04-20 NOTE — Telephone Encounter (Signed)
Returned pt call- lvm for pt to call and make appt per pt request//tes

## 2019-04-21 ENCOUNTER — Other Ambulatory Visit: Payer: Self-pay

## 2019-04-21 ENCOUNTER — Other Ambulatory Visit: Payer: Self-pay | Admitting: *Deleted

## 2019-04-21 ENCOUNTER — Telehealth: Payer: Medicare HMO | Admitting: Internal Medicine

## 2019-04-21 DIAGNOSIS — Z79899 Other long term (current) drug therapy: Secondary | ICD-10-CM

## 2019-04-21 DIAGNOSIS — G894 Chronic pain syndrome: Secondary | ICD-10-CM

## 2019-04-21 MED ORDER — HYDROCODONE-ACETAMINOPHEN 5-325 MG PO TABS: 1.0000 | ORAL_TABLET | Freq: Three times a day (TID) | ORAL | 0 refills | Status: DC | PRN

## 2019-04-21 NOTE — Telephone Encounter (Signed)
Pt called to check status, pt states that she will not be able to sleep tonight without at least 3 pills. Please advise [facility] [address] [place] [address]  Phone: [phone] Fax: [phone]

## 2019-04-21 NOTE — Telephone Encounter (Signed)
Copied from [place] [id number]. Topic: Appointment Scheduling - Scheduling Inquiry for Clinic >> Apr 17, 2019  3:32 PM Sheran Luz wrote: Patient calling to schedule medication refill appointment.

## 2019-04-21 NOTE — Telephone Encounter (Signed)
Ok to give 5 pills. For future reference, she should not wait until Rx runs out to make appointment.

## 2019-04-21 NOTE — Telephone Encounter (Signed)
Pt state that she would like to have 4-5 pills called in until her appointment.  Pt would like to have a call to let her know when they are called in to the pharmacy.

## 2019-04-22 ENCOUNTER — Other Ambulatory Visit: Payer: Self-pay

## 2019-04-22 ENCOUNTER — Telehealth (INDEPENDENT_AMBULATORY_CARE_PROVIDER_SITE_OTHER): Payer: Medicare HMO | Admitting: Internal Medicine

## 2019-04-22 DIAGNOSIS — F411 Generalized anxiety disorder: Secondary | ICD-10-CM | POA: Diagnosis not present

## 2019-04-22 DIAGNOSIS — G894 Chronic pain syndrome: Secondary | ICD-10-CM

## 2019-04-22 DIAGNOSIS — Z79899 Other long term (current) drug therapy: Secondary | ICD-10-CM | POA: Diagnosis not present

## 2019-04-22 MED ORDER — ALPRAZOLAM 0.25 MG PO TABS: ORAL_TABLET | ORAL | 2 refills | Status: DC

## 2019-04-22 MED ORDER — HYDROCODONE-ACETAMINOPHEN 5-325 MG PO TABS: 1.0000 | ORAL_TABLET | Freq: Three times a day (TID) | ORAL | 0 refills | Status: DC | PRN

## 2019-04-22 NOTE — Progress Notes (Signed)
Virtual Visit via Telephone Note  I connected with [name] on 04/22/19 at  3:30 PM EDT by telephone and verified that I am speaking with the correct person using two identifiers.   I discussed the limitations, risks, security and privacy concerns of performing an evaluation and management service by telephone and the availability of in person appointments. I also discussed with the patient that there may be a patient responsible charge related to this service. The patient expressed understanding and agreed to proceed.  Location patient: home Location provider: work office Participants present for the call: patient, provider Patient did not have a visit in the prior 7 days to address this/these issue(s).   History of Present Illness:  Scheduled visit for medication refills. She has a h/o Anxiety and chronic pain syndrome and has been on xanax and hydrocodone for years. She is due for her 3 month refills. No acute complaints or side effects to disclose.   Observations/Objective: Patient sounds cheerful and well on the phone. I do not appreciate any increased work of breathing. Speech and thought processing are grossly intact. Patient reported vitals: none reported   Current Outpatient Medications:  .  acetaminophen (TYLENOL 8 HOUR) 650 MG CR tablet, Take 650 mg by mouth every 8 (eight) hours as needed for pain., Disp: , Rfl:  .  ALPRAZolam (XANAX) 0.25 MG tablet, TAKE (1) TABLET TWICE DAILY AS NEEDED FOR ANXIETY., Disp: 60 tablet, Rfl: 2 .  amLODipine (NORVASC) 5 MG tablet, Take 5 mg by mouth daily., Disp: , Rfl:  .  calcium & magnesium carbonates (MYLANTA) 311-232 MG tablet, Take 1 tablet by mouth daily., Disp: , Rfl:  .  famotidine (PEPCID) 20 MG tablet, Take 20 mg by mouth daily., Disp: , Rfl:  .  HYDROcodone-acetaminophen (NORCO/VICODIN) 5-325 MG tablet, Take 1 tablet by mouth 3 (three) times daily as needed for moderate pain., Disp: 90 tablet, Rfl: 0 .   HYDROcodone-acetaminophen (NORCO/VICODIN) 5-325 MG tablet, Take 1 tablet by mouth 3 (three) times daily as needed for moderate pain., Disp: 90 tablet, Rfl: 0 .  HYDROcodone-acetaminophen (NORCO/VICODIN) 5-325 MG tablet, Take 1 tablet by mouth 3 (three) times daily as needed for moderate pain., Disp: 90 tablet, Rfl: 0 .  lisinopril (ZESTRIL) 5 MG tablet, TAKE  (1)  TABLET TWICE A DAY FOR HIGH BLOOD PRESSURE., Disp: 180 tablet, Rfl: 0 .  mometasone (NASONEX) 50 MCG/ACT nasal spray, Place 2 sprays into the nose at bedtime., Disp: 17 g, Rfl: 6 .  oxybutynin (DITROPAN-XL) 10 MG 24 hr tablet, TAKE (1) TABLET DAILY AT BEDTIME., Disp: 30 tablet, Rfl: 0 .  polyvinyl alcohol (LIQUIFILM TEARS) 1.4 % ophthalmic solution, Place 1-2 drops into both eyes daily as needed for dry eyes., Disp: , Rfl:  .  simvastatin (ZOCOR) 20 MG tablet, Take 20 mg by mouth daily., Disp: , Rfl:   Review of Systems:  Constitutional: Denies fever, chills, diaphoresis, appetite change and fatigue.  HEENT: Denies photophobia, eye pain, redness, hearing loss, ear pain, congestion, sore throat, rhinorrhea, sneezing, mouth sores, trouble swallowing, neck pain, neck stiffness and tinnitus.   Respiratory: Denies SOB, DOE, cough, chest tightness,  and wheezing.   Cardiovascular: Denies chest pain, palpitations and leg swelling.  Gastrointestinal: Denies nausea, vomiting, abdominal pain, diarrhea, constipation, blood in stool and abdominal distention.  Genitourinary: Denies dysuria, urgency, frequency, hematuria, flank pain and difficulty urinating.  Endocrine: Denies: hot or cold intolerance, sweats, changes in hair or nails, polyuria, polydipsia. Musculoskeletal: Denies myalgias, back  pain, joint swelling, arthralgias and gait problem.  Skin: Denies pallor, rash and wound.  Neurological: Denies dizziness, seizures, syncope, weakness, light-headedness, numbness and headaches.  Hematological: Denies adenopathy. Easy bruising, personal or  family bleeding history  Psychiatric/Behavioral: Denies suicidal ideation, mood changes, confusion, nervousness, sleep disturbance and agitation   Assessment and Plan:  Generalized anxiety disorder -Refill Xanax x 3 months today.  Chronic pain syndrome High risk medication use -Due for 3 month refills per verbal opioid contract. -Advised next appointment has to be in person for her to sign a pain management contract with me. -PDMP has been reviewed, no red flags, overdose risk score is 170. -#90 tabs a month x 3 scripts given today.     I discussed the assessment and treatment plan with the patient. The patient was provided an opportunity to ask questions and all were answered. The patient agreed with the plan and demonstrated an understanding of the instructions.   The patient was advised to call back or seek an in-person evaluation if the symptoms worsen or if the condition fails to improve as anticipated.  I provided 13 minutes of non-face-to-face time during this encounter.   Lelon Frohlich, MD [facility]

## 2019-05-04 ENCOUNTER — Other Ambulatory Visit: Payer: Self-pay | Admitting: Internal Medicine

## 2019-05-26 ENCOUNTER — Ambulatory Visit (INDEPENDENT_AMBULATORY_CARE_PROVIDER_SITE_OTHER): Payer: Medicare HMO

## 2019-05-26 ENCOUNTER — Other Ambulatory Visit: Payer: Self-pay

## 2019-05-26 DIAGNOSIS — Z23 Encounter for immunization: Secondary | ICD-10-CM | POA: Diagnosis not present

## 2019-06-02 ENCOUNTER — Other Ambulatory Visit: Payer: Self-pay

## 2019-06-02 ENCOUNTER — Ambulatory Visit (INDEPENDENT_AMBULATORY_CARE_PROVIDER_SITE_OTHER): Payer: Medicare HMO | Admitting: Internal Medicine

## 2019-06-02 ENCOUNTER — Telehealth: Payer: Self-pay | Admitting: *Deleted

## 2019-06-02 ENCOUNTER — Encounter: Payer: Self-pay | Admitting: Internal Medicine

## 2019-06-02 VITALS — BP 130/90 | HR 100 | Temp 97.3°F | Wt 186.1 lb

## 2019-06-02 DIAGNOSIS — R300 Dysuria: Secondary | ICD-10-CM | POA: Diagnosis not present

## 2019-06-02 DIAGNOSIS — H9202 Otalgia, left ear: Secondary | ICD-10-CM

## 2019-06-02 DIAGNOSIS — H6121 Impacted cerumen, right ear: Secondary | ICD-10-CM | POA: Diagnosis not present

## 2019-06-02 LAB — POCT URINALYSIS DIPSTICK
Bilirubin, UA: NEGATIVE
Glucose, UA: NEGATIVE
Ketones, UA: NEGATIVE
Leukocytes, UA: NEGATIVE
Nitrite, UA: POSITIVE
Protein, UA: NEGATIVE
Spec Grav, UA: 1.015 (ref 1.010–1.025)
Urobilinogen, UA: 0.2 E.U./dL
pH, UA: 5 (ref 5.0–8.0)

## 2019-06-02 MED ORDER — CIPROFLOXACIN HCL 500 MG PO TABS: 500.0000 mg | ORAL_TABLET | Freq: Two times a day (BID) | ORAL | 0 refills | Status: AC

## 2019-06-02 NOTE — Telephone Encounter (Signed)
Note ready.  Waiting on fax number.

## 2019-06-02 NOTE — Telephone Encounter (Signed)
Patient returning call with fax number [phone] attn [facility]

## 2019-06-02 NOTE — Addendum Note (Signed)
Addended by: Elmer Picker on: 06/02/2019 08:16 AM   Modules accepted: Orders

## 2019-06-02 NOTE — Telephone Encounter (Signed)
What does she want her work note to say?

## 2019-06-02 NOTE — Addendum Note (Signed)
Addended by: Elmer Picker on: 06/02/2019 08:08 AM   Modules accepted: Orders

## 2019-06-02 NOTE — Telephone Encounter (Signed)
Copied from [place] [phone]. Topic: General - Other >> Jun 02, 2019 11:13 AM Carolyn Stare wrote: Pt said she need a return to work note since she left work sick ,

## 2019-06-02 NOTE — Progress Notes (Signed)
Acute Office Visit     CC/Reason for Visit: dysuria, left ear pain  HPI: [name] is a 78 y.o. female who is coming in today for the above mentioned reasons. Dysuria, frequency and unable to empty bladder fully for 2 days. No fever, N/V. Also has a left ear ache for 3 days. Used Debrox yesterday thinking it was earwax.  Past Medical/Surgical History: Past Medical History:  Diagnosis Date  . Allergy   . Anxiety   . Arthritis    PAIN AND OA RIGHT KNEE; PAIN AND DDD AND SPINAL STENOSIS  . Complication of anesthesia    PULLED OUT MY IV'S AND FOLEY - HALLUCINATIONS WITH HIATAL HERNIA SURGERY  . Depression   . Diverticulosis    PER COLONOSCOPY  . GERD (gastroesophageal reflux disease)    OVER THE COUNTER MEDS IF NEEDED  . Heart murmur    "VERY SLIGHT ALL MY LIFE"  . Hyperlipidemia   . Hypertension   . Sleep apnea    does not tolerate CPAP - HAS NOT USED IN OVER 2 YRS    Past Surgical History:  Procedure Laterality Date  . COLONOSCOPY    . HIATAL HERNIA REPAIR  2008  . LUMBAR FUSION    . LUMBAR LAMINECTOMY    . PARTIAL KNEE ARTHROPLASTY Right 07/02/2014   Procedure: RIGHT MEDIAL UNICOMPARTMENTAL KNEE;  Surgeon: Mauri Pole, MD;  Location: [facility];  Service: Orthopedics;  Laterality: Right;  . UPPER GASTROINTESTINAL ENDOSCOPY      Social History:  reports that she has never smoked. She has never used smokeless tobacco. She reports that she does not drink alcohol or use drugs.  Allergies: Allergies  Allergen Reactions  . Ambien [Zolpidem Tartrate]     amnesia  . Hydrochlorothiazide     Other reaction(s): Lethargy (intolerance)  . Prednisone     Rapid heart rate  . Thiazide-Type Diuretics Other (See Comments)    "could not physically move"  . Sulfamethoxazole Rash    Family History:  Family History  Problem Relation Age of Onset  . Heart disease Mother   . Heart disease Father   . Heart disease Sister   . Pulmonary embolism Sister   .  Diabetes Paternal Grandfather      Current Outpatient Medications:  .  acetaminophen (TYLENOL 8 HOUR) 650 MG CR tablet, Take 650 mg by mouth every 8 (eight) hours as needed for pain., Disp: , Rfl:  .  ALPRAZolam (XANAX) 0.25 MG tablet, TAKE (1) TABLET TWICE DAILY AS NEEDED FOR ANXIETY., Disp: 60 tablet, Rfl: 2 .  amLODipine (NORVASC) 5 MG tablet, Take 5 mg by mouth daily., Disp: , Rfl:  .  calcium & magnesium carbonates (MYLANTA) 311-232 MG tablet, Take 1 tablet by mouth daily., Disp: , Rfl:  .  famotidine (PEPCID) 20 MG tablet, Take 20 mg by mouth daily., Disp: , Rfl:  .  HYDROcodone-acetaminophen (NORCO/VICODIN) 5-325 MG tablet, Take 1 tablet by mouth 3 (three) times daily as needed for moderate pain., Disp: 90 tablet, Rfl: 0 .  HYDROcodone-acetaminophen (NORCO/VICODIN) 5-325 MG tablet, Take 1 tablet by mouth 3 (three) times daily as needed for moderate pain., Disp: 90 tablet, Rfl: 0 .  HYDROcodone-acetaminophen (NORCO/VICODIN) 5-325 MG tablet, Take 1 tablet by mouth 3 (three) times daily as needed for moderate pain., Disp: 90 tablet, Rfl: 0 .  lisinopril (ZESTRIL) 5 MG tablet, TAKE  (1)  TABLET TWICE A DAY FOR HIGH BLOOD PRESSURE., Disp: 180 tablet, Rfl: 1 .  mometasone (NASONEX) 50 MCG/ACT nasal spray, Place 2 sprays into the nose at bedtime., Disp: 17 g, Rfl: 6 .  oxybutynin (DITROPAN-XL) 10 MG 24 hr tablet, TAKE (1) TABLET DAILY AT BEDTIME., Disp: 30 tablet, Rfl: 0 .  polyvinyl alcohol (LIQUIFILM TEARS) 1.4 % ophthalmic solution, Place 1-2 drops into both eyes daily as needed for dry eyes., Disp: , Rfl:  .  simvastatin (ZOCOR) 20 MG tablet, Take 20 mg by mouth daily., Disp: , Rfl:  .  triamcinolone cream (KENALOG) 0.1 %, Apply 1 application topically 2 (two) times daily., Disp: , Rfl:  .  ciprofloxacin (CIPRO) 500 MG tablet, Take 1 tablet (500 mg total) by mouth 2 (two) times daily for 5 days., Disp: 10 tablet, Rfl: 0  Review of Systems:  Constitutional: Denies fever, chills, diaphoresis,  appetite change and fatigue.  HEENT: Denies photophobia, eye pain, redness, hearing loss, congestion, sore throat, rhinorrhea, sneezing, mouth sores, trouble swallowing, neck pain, neck stiffness and tinnitus.   Respiratory: Denies SOB, DOE, cough, chest tightness,  and wheezing.   Cardiovascular: Denies chest pain, palpitations and leg swelling.  Gastrointestinal: Denies nausea, vomiting, abdominal pain, diarrhea, constipation, blood in stool and abdominal distention.  Genitourinary: Positive for dysuria, urgency, frequency, hematuria, flank pain and difficulty urinating.  Endocrine: Denies: hot or cold intolerance, sweats, changes in hair or nails, polyuria, polydipsia. Musculoskeletal: Denies myalgias, back pain, joint swelling, arthralgias and gait problem.  Skin: Denies pallor, rash and wound.  Neurological: Denies dizziness, seizures, syncope, weakness, light-headedness, numbness and headaches.  Hematological: Denies adenopathy. Easy bruising, personal or family bleeding history  Psychiatric/Behavioral: Denies suicidal ideation, mood changes, confusion, nervousness, sleep disturbance and agitation    Physical Exam: Vitals:   06/02/19 0704  BP: 130/90  Pulse: 100  Temp: (!) 97.3 F (36.3 C)  TempSrc: Temporal  SpO2: 95%  Weight: 186 lb 1.6 oz (84.4 kg)    Body mass index is 32.97 kg/m.   Constitutional: NAD, calm, comfortable Eyes: PERRL, lids and conjunctivae normal, wears corrective lenses. ENMT: Mucous membranes are moist. Tympanic membrane is pearly white, no erythema or bulging on the left, right is obstructed by cerumen. Neck: normal, supple, no masses, no thyromegaly Abdomen: no tenderness, no masses palpated. No hepatosplenomegaly. Bowel sounds positive.  Musculoskeletal: no clubbing / cyanosis. No joint deformity upper and lower extremities. Good ROM, no contractures. Normal muscle tone.  Neurologic:grossly intact and non-focal. Psychiatric: Normal judgment and  insight. Alert and oriented x 3. Normal mood.    Impression and Plan:  Dysuria -dipstick seems c/w a UTI with blood and nitrates. -Send for UA and cx. -Cipro 500 mg BID for 5 days.  Earache on left -No signs of infection, TM looks clear.  Right ear impacted cerumen -Cerumen Desimpaction  Warm water was applied and gentle ear lavage performed on right ear. There were no complications and following the desimpaction the tympanic membranes were visible. Tympanic membranes are intact following the procedure. Auditory canals are normal. The patient reported relief of symptoms after removal of cerumen.     Patient Instructions  -Nice seeing you today!!  -Will let you know when I have results from your urine tests.  -START cipro 1 tablet twice daily for 5 days.   Urinary Tract Infection, Adult A urinary tract infection (UTI) is an infection of any part of the urinary tract. The urinary tract includes:  The kidneys.  The ureters.  The bladder.  The urethra. These organs make, store, and get rid of pee (urine) in  the body. What are the causes? This is caused by germs (bacteria) in your genital area. These germs grow and cause swelling (inflammation) of your urinary tract. What increases the risk? You are more likely to develop this condition if:  You have a small, thin tube (catheter) to drain pee.  You cannot control when you pee or poop (incontinence).  You are female, and: ? You use these methods to prevent pregnancy: ? A medicine that kills sperm (spermicide). ? A device that blocks sperm (diaphragm). ? You have low levels of a female hormone (estrogen). ? You are pregnant.  You have genes that add to your risk.  You are sexually active.  You take antibiotic medicines.  You have trouble peeing because of: ? A prostate that is bigger than normal, if you are female. ? A blockage in the part of your body that drains pee from the bladder (urethra). ? A kidney  stone. ? A nerve condition that affects your bladder (neurogenic bladder). ? Not getting enough to drink. ? Not peeing often enough.  You have other conditions, such as: ? Diabetes. ? A weak disease-fighting system (immune system). ? Sickle cell disease. ? Gout. ? Injury of the spine. What are the signs or symptoms? Symptoms of this condition include:  Needing to pee right away (urgently).  Peeing often.  Peeing small amounts often.  Pain or burning when peeing.  Blood in the pee.  Pee that smells bad or not like normal.  Trouble peeing.  Pee that is cloudy.  Fluid coming from the vagina, if you are female.  Pain in the belly or lower back. Other symptoms include:  Throwing up (vomiting).  No urge to eat.  Feeling mixed up (confused).  Being tired and grouchy (irritable).  A fever.  Watery poop (diarrhea). How is this treated? This condition may be treated with:  Antibiotic medicine.  Other medicines.  Drinking enough water. Follow these instructions at home:  Medicines  Take over-the-counter and prescription medicines only as told by your doctor.  If you were prescribed an antibiotic medicine, take it as told by your doctor. Do not stop taking it even if you start to feel better. General instructions  Make sure you: ? Pee until your bladder is empty. ? Do not hold pee for a long time. ? Empty your bladder after sex. ? Wipe from front to back after pooping if you are a female. Use each tissue one time when you wipe.  Drink enough fluid to keep your pee pale yellow.  Keep all follow-up visits as told by your doctor. This is important. Contact a doctor if:  You do not get better after 1-2 days.  Your symptoms go away and then come back. Get help right away if:  You have very bad back pain.  You have very bad pain in your lower belly.  You have a fever.  You are sick to your stomach (nauseous).  You are throwing up. Summary  A  urinary tract infection (UTI) is an infection of any part of the urinary tract.  This condition is caused by germs in your genital area.  There are many risk factors for a UTI. These include having a small, thin tube to drain pee and not being able to control when you pee or poop.  Treatment includes antibiotic medicines for germs.  Drink enough fluid to keep your pee pale yellow. This information is not intended to replace advice given to you by your  health care provider. Make sure you discuss any questions you have with your health care provider. Document Released: 02/06/2008 Document Revised: 08/07/2018 Document Reviewed: 02/27/2018 Elsevier Patient Education  2020 [place], MD [facility]

## 2019-06-02 NOTE — Patient Instructions (Addendum)
-Nice seeing you today!!  -Will let you know when I have results from your urine tests.  -START cipro 1 tablet twice daily for 5 days.   Urinary Tract Infection, Adult A urinary tract infection (UTI) is an infection of any part of the urinary tract. The urinary tract includes:  The kidneys.  The ureters.  The bladder.  The urethra. These organs make, store, and get rid of pee (urine) in the body. What are the causes? This is caused by germs (bacteria) in your genital area. These germs grow and cause swelling (inflammation) of your urinary tract. What increases the risk? You are more likely to develop this condition if:  You have a small, thin tube (catheter) to drain pee.  You cannot control when you pee or poop (incontinence).  You are female, and: ? You use these methods to prevent pregnancy: ? A medicine that kills sperm (spermicide). ? A device that blocks sperm (diaphragm). ? You have low levels of a female hormone (estrogen). ? You are pregnant.  You have genes that add to your risk.  You are sexually active.  You take antibiotic medicines.  You have trouble peeing because of: ? A prostate that is bigger than normal, if you are female. ? A blockage in the part of your body that drains pee from the bladder (urethra). ? A kidney stone. ? A nerve condition that affects your bladder (neurogenic bladder). ? Not getting enough to drink. ? Not peeing often enough.  You have other conditions, such as: ? Diabetes. ? A weak disease-fighting system (immune system). ? Sickle cell disease. ? Gout. ? Injury of the spine. What are the signs or symptoms? Symptoms of this condition include:  Needing to pee right away (urgently).  Peeing often.  Peeing small amounts often.  Pain or burning when peeing.  Blood in the pee.  Pee that smells bad or not like normal.  Trouble peeing.  Pee that is cloudy.  Fluid coming from the vagina, if you are female.  Pain in  the belly or lower back. Other symptoms include:  Throwing up (vomiting).  No urge to eat.  Feeling mixed up (confused).  Being tired and grouchy (irritable).  A fever.  Watery poop (diarrhea). How is this treated? This condition may be treated with:  Antibiotic medicine.  Other medicines.  Drinking enough water. Follow these instructions at home:  Medicines  Take over-the-counter and prescription medicines only as told by your doctor.  If you were prescribed an antibiotic medicine, take it as told by your doctor. Do not stop taking it even if you start to feel better. General instructions  Make sure you: ? Pee until your bladder is empty. ? Do not hold pee for a long time. ? Empty your bladder after sex. ? Wipe from front to back after pooping if you are a female. Use each tissue one time when you wipe.  Drink enough fluid to keep your pee pale yellow.  Keep all follow-up visits as told by your doctor. This is important. Contact a doctor if:  You do not get better after 1-2 days.  Your symptoms go away and then come back. Get help right away if:  You have very bad back pain.  You have very bad pain in your lower belly.  You have a fever.  You are sick to your stomach (nauseous).  You are throwing up. Summary  A urinary tract infection (UTI) is an infection of any part of  the urinary tract.  This condition is caused by germs in your genital area.  There are many risk factors for a UTI. These include having a small, thin tube to drain pee and not being able to control when you pee or poop.  Treatment includes antibiotic medicines for germs.  Drink enough fluid to keep your pee pale yellow. This information is not intended to replace advice given to you by your health care provider. Make sure you discuss any questions you have with your health care provider. Document Released: 02/06/2008 Document Revised: 08/07/2018 Document Reviewed: 02/27/2018  Elsevier Patient Education  2020 [employer].

## 2019-06-03 NOTE — Telephone Encounter (Signed)
Letter faxed and confirmed

## 2019-06-04 LAB — URINE CULTURE
MICRO NUMBER:: 934302
SPECIMEN QUALITY:: ADEQUATE

## 2019-06-18 ENCOUNTER — Other Ambulatory Visit: Payer: Self-pay | Admitting: Internal Medicine

## 2019-07-06 DIAGNOSIS — H52203 Unspecified astigmatism, bilateral: Secondary | ICD-10-CM | POA: Diagnosis not present

## 2019-07-06 DIAGNOSIS — H5203 Hypermetropia, bilateral: Secondary | ICD-10-CM | POA: Diagnosis not present

## 2019-07-06 DIAGNOSIS — H524 Presbyopia: Secondary | ICD-10-CM | POA: Diagnosis not present

## 2019-07-13 ENCOUNTER — Other Ambulatory Visit: Payer: Self-pay | Admitting: Internal Medicine

## 2019-07-13 DIAGNOSIS — Z79899 Other long term (current) drug therapy: Secondary | ICD-10-CM

## 2019-07-13 DIAGNOSIS — G894 Chronic pain syndrome: Secondary | ICD-10-CM

## 2019-07-13 NOTE — Telephone Encounter (Signed)
Please see patient rx refill request

## 2019-07-22 ENCOUNTER — Other Ambulatory Visit: Payer: Self-pay

## 2019-07-23 ENCOUNTER — Ambulatory Visit (INDEPENDENT_AMBULATORY_CARE_PROVIDER_SITE_OTHER): Payer: Medicare HMO | Admitting: Internal Medicine

## 2019-07-23 ENCOUNTER — Encounter: Payer: Self-pay | Admitting: Internal Medicine

## 2019-07-23 VITALS — BP 114/70 | HR 65 | Temp 97.8°F | Wt 187.4 lb

## 2019-07-23 DIAGNOSIS — F411 Generalized anxiety disorder: Secondary | ICD-10-CM

## 2019-07-23 DIAGNOSIS — Z79899 Other long term (current) drug therapy: Secondary | ICD-10-CM

## 2019-07-23 DIAGNOSIS — G894 Chronic pain syndrome: Secondary | ICD-10-CM

## 2019-07-23 MED ORDER — HYDROCODONE-ACETAMINOPHEN 5-325 MG PO TABS: 1.0000 | ORAL_TABLET | Freq: Three times a day (TID) | ORAL | 0 refills | Status: DC | PRN

## 2019-07-23 MED ORDER — ALPRAZOLAM 0.25 MG PO TABS: ORAL_TABLET | ORAL | 2 refills | Status: DC

## 2019-07-23 NOTE — Patient Instructions (Signed)
-  Nice seeing you today!!  -Medications have been refilled today.  -Schedule your physical in 3 months. Please come in fasting that day.

## 2019-07-23 NOTE — Progress Notes (Signed)
Established Patient Office Visit     CC/Reason for Visit: Medication refills  HPI: [name] is a 78 y.o. female who is coming in today for the above mentioned reasons. Past Medical History is significant for: She has a history of generalized anxiety disorder and chronic pain syndrome and has been maintained on Xanax and hydrocodone for years.  She takes the Xanax twice daily as noted on the hydrocodone 3 times daily as needed.  She has had no issues.  She is going through an increase of anxiety due to her husband being very sick with chronic kidney disease congestive heart failure.  She has no side effects to disclose.   Past Medical/Surgical History: Past Medical History:  Diagnosis Date  . Allergy   . Anxiety   . Arthritis    PAIN AND OA RIGHT KNEE; PAIN AND DDD AND SPINAL STENOSIS  . Complication of anesthesia    PULLED OUT MY IV'S AND FOLEY - HALLUCINATIONS WITH HIATAL HERNIA SURGERY  . Depression   . Diverticulosis    PER COLONOSCOPY  . GERD (gastroesophageal reflux disease)    OVER THE COUNTER MEDS IF NEEDED  . Heart murmur    "VERY SLIGHT ALL MY LIFE"  . Hyperlipidemia   . Hypertension   . Sleep apnea    does not tolerate CPAP - HAS NOT USED IN OVER 2 YRS    Past Surgical History:  Procedure Laterality Date  . COLONOSCOPY    . HIATAL HERNIA REPAIR  2008  . LUMBAR FUSION    . LUMBAR LAMINECTOMY    . PARTIAL KNEE ARTHROPLASTY Right 07/02/2014   Procedure: RIGHT MEDIAL UNICOMPARTMENTAL KNEE;  Surgeon: Mauri Pole, MD;  Location: [facility];  Service: Orthopedics;  Laterality: Right;  . UPPER GASTROINTESTINAL ENDOSCOPY      Social History:  reports that she has never smoked. She has never used smokeless tobacco. She reports that she does not drink alcohol or use drugs.  Allergies: Allergies  Allergen Reactions  . Ambien [Zolpidem Tartrate]     amnesia  . Hydrochlorothiazide     Other reaction(s): Lethargy (intolerance)  . Prednisone     Rapid  heart rate  . Thiazide-Type Diuretics Other (See Comments)    "could not physically move"  . Sulfamethoxazole Rash    Family History:  Family History  Problem Relation Age of Onset  . Heart disease Mother   . Heart disease Father   . Heart disease Sister   . Pulmonary embolism Sister   . Diabetes Paternal Grandfather      Current Outpatient Medications:  .  acetaminophen (TYLENOL 8 HOUR) 650 MG CR tablet, Take 650 mg by mouth every 8 (eight) hours as needed for pain., Disp: , Rfl:  .  ALPRAZolam (XANAX) 0.25 MG tablet, TAKE (1) TABLET TWICE DAILY AS NEEDED FOR ANXIETY., Disp: 60 tablet, Rfl: 2 .  amLODipine (NORVASC) 5 MG tablet, TAKE (1) TABLET DAILY FOR HIGH BLOOD PRESSURE., Disp: 90 tablet, Rfl: 1 .  calcium & magnesium carbonates (MYLANTA) [id number] MG tablet, Take 1 tablet by mouth daily., Disp: , Rfl:  .  famotidine (PEPCID) 20 MG tablet, Take 20 mg by mouth daily., Disp: , Rfl:  .  HYDROcodone-acetaminophen (NORCO/VICODIN) 5-325 MG tablet, Take 1 tablet by mouth 3 (three) times daily as needed for moderate pain., Disp: 90 tablet, Rfl: 0 .  HYDROcodone-acetaminophen (NORCO/VICODIN) 5-325 MG tablet, Take 1 tablet by mouth 3 (three) times daily as needed for moderate pain.,  Disp: 90 tablet, Rfl: 0 .  HYDROcodone-acetaminophen (NORCO/VICODIN) 5-325 MG tablet, Take 1 tablet by mouth 3 (three) times daily as needed for moderate pain., Disp: 90 tablet, Rfl: 0 .  lisinopril (ZESTRIL) 5 MG tablet, TAKE  (1)  TABLET TWICE A DAY FOR HIGH BLOOD PRESSURE., Disp: 180 tablet, Rfl: 1 .  mometasone (NASONEX) 50 MCG/ACT nasal spray, Place 2 sprays into the nose at bedtime., Disp: 17 g, Rfl: 6 .  oxybutynin (DITROPAN-XL) 10 MG 24 hr tablet, TAKE (1) TABLET DAILY AT BEDTIME., Disp: 30 tablet, Rfl: 0 .  polyvinyl alcohol (LIQUIFILM TEARS) 1.4 % ophthalmic solution, Place 1-2 drops into both eyes daily as needed for dry eyes., Disp: , Rfl:  .  simvastatin (ZOCOR) 20 MG tablet, Take 20 mg by mouth  daily., Disp: , Rfl:  .  triamcinolone cream (KENALOG) 0.1 %, APPLY TO RASH SPARINGLY ONCE A DAY., Disp: 30 g, Rfl: 0  Review of Systems:  Constitutional: Denies fever, chills, diaphoresis, appetite change and fatigue.  HEENT: Denies photophobia, eye pain, redness, hearing loss, ear pain, congestion, sore throat, rhinorrhea, sneezing, mouth sores, trouble swallowing, neck pain, neck stiffness and tinnitus.   Respiratory: Denies SOB, DOE, cough, chest tightness,  and wheezing.   Cardiovascular: Denies chest pain, palpitations and leg swelling.  Gastrointestinal: Denies nausea, vomiting, abdominal pain, diarrhea, constipation, blood in stool and abdominal distention.  Genitourinary: Denies dysuria, urgency, frequency, hematuria, flank pain and difficulty urinating.  Endocrine: Denies: hot or cold intolerance, sweats, changes in hair or nails, polyuria, polydipsia. Musculoskeletal: Denies myalgias, back pain, joint swelling, arthralgias and gait problem.  Skin: Denies pallor, rash and wound.  Neurological: Denies dizziness, seizures, syncope, weakness, light-headedness, numbness and headaches.  Hematological: Denies adenopathy. Easy bruising, personal or family bleeding history  Psychiatric/Behavioral: Denies suicidal ideation, mood changes, confusion, nervousness, sleep disturbance and agitation    Physical Exam: Vitals:   07/23/19 0959  BP: 114/70  Pulse: 65  Temp: 97.8 F (36.6 C)  TempSrc: Temporal  SpO2: 97%  Weight: 187 lb 6.4 oz (85 kg)    Body mass index is 33.2 kg/m.   Constitutional: NAD, calm, comfortable Eyes: PERRL, lids and conjunctivae normal, wears corrective lenses ENMT: Mucous membranes are moist. Respiratory: clear to auscultation bilaterally, no wheezing, no crackles. Normal respiratory effort. No accessory muscle use.  Cardiovascular: Regular rate and rhythm, no murmurs / rubs / gallops. No extremity edema. 2+ pedal pulses.  Psychiatric: Normal judgment and  insight. Alert and oriented x 3. Normal mood.    Impression and Plan:  High risk medication use Generalized anxiety disorder Chronic pain syndrome   -Refilled Xanax x3 months.  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  -Medications have been refilled today.  -Schedule your physical in 3 months. Please come in fasting that day.     Lelon Frohlich, MD [facility]

## 2019-08-03 ENCOUNTER — Encounter: Payer: Self-pay | Admitting: Internal Medicine

## 2019-08-31 ENCOUNTER — Other Ambulatory Visit: Payer: Self-pay | Admitting: Internal Medicine

## 2019-09-02 ENCOUNTER — Other Ambulatory Visit: Payer: Self-pay | Admitting: Internal Medicine

## 2019-09-22 ENCOUNTER — Telehealth: Payer: Self-pay | Admitting: Internal Medicine

## 2019-09-22 NOTE — Telephone Encounter (Signed)
Pt is concerned that she will not get her medication refills due to having to r/s her cpe to April since the provider will be out of office (working in hospital). Pt says her medication is refilled on different dates but wanted [place] to notate that she will need refils before her cpe date.

## 2019-09-24 NOTE — Telephone Encounter (Signed)
Spoke with patient and she will have to reschedule her appointment close to 10/23/2019. Please call patient when schedule is open.

## 2019-09-29 ENCOUNTER — Other Ambulatory Visit: Payer: Self-pay | Admitting: Internal Medicine

## 2019-10-13 ENCOUNTER — Other Ambulatory Visit: Payer: Self-pay | Admitting: Internal Medicine

## 2019-10-13 DIAGNOSIS — F411 Generalized anxiety disorder: Secondary | ICD-10-CM

## 2019-10-13 NOTE — Telephone Encounter (Signed)
Patient needs a refill on Alprazolam called into [facility] [place].   [phone]

## 2019-10-13 NOTE — Telephone Encounter (Signed)
Last refill given at office visit 07/23/2019.

## 2019-10-14 MED ORDER — ALPRAZOLAM 0.25 MG PO TABS: ORAL_TABLET | ORAL | 2 refills | Status: DC

## 2019-10-23 ENCOUNTER — Encounter: Payer: Medicare HMO | Admitting: Internal Medicine

## 2019-10-24 ENCOUNTER — Other Ambulatory Visit: Payer: Self-pay | Admitting: Internal Medicine

## 2019-10-27 MED ORDER — SIMVASTATIN 20 MG PO TABS: 20.0000 mg | ORAL_TABLET | Freq: Every day | ORAL | 0 refills | Status: DC

## 2019-10-27 MED ORDER — OXYBUTYNIN CHLORIDE ER 10 MG PO TB24: 10.0000 mg | ORAL_TABLET | Freq: Every day | ORAL | 0 refills | Status: DC

## 2019-11-02 ENCOUNTER — Other Ambulatory Visit: Payer: Self-pay | Admitting: Internal Medicine

## 2019-11-03 ENCOUNTER — Other Ambulatory Visit: Payer: Self-pay | Admitting: Internal Medicine

## 2019-11-03 MED ORDER — OXYBUTYNIN CHLORIDE ER 10 MG PO TB24: 10.0000 mg | ORAL_TABLET | Freq: Every day | ORAL | 0 refills | Status: DC

## 2019-11-03 MED ORDER — SIMVASTATIN 20 MG PO TABS: 20.0000 mg | ORAL_TABLET | Freq: Every day | ORAL | 0 refills | Status: DC

## 2019-12-10 ENCOUNTER — Other Ambulatory Visit: Payer: Self-pay

## 2019-12-11 ENCOUNTER — Encounter: Payer: Self-pay | Admitting: Internal Medicine

## 2019-12-11 ENCOUNTER — Ambulatory Visit (INDEPENDENT_AMBULATORY_CARE_PROVIDER_SITE_OTHER): Payer: Medicare PPO | Admitting: Internal Medicine

## 2019-12-11 VITALS — BP 124/80 | HR 87 | Temp 97.0°F | Ht 63.0 in | Wt 189.2 lb

## 2019-12-11 DIAGNOSIS — E669 Obesity, unspecified: Secondary | ICD-10-CM | POA: Diagnosis not present

## 2019-12-11 DIAGNOSIS — Z79899 Other long term (current) drug therapy: Secondary | ICD-10-CM

## 2019-12-11 DIAGNOSIS — G894 Chronic pain syndrome: Secondary | ICD-10-CM | POA: Diagnosis not present

## 2019-12-11 DIAGNOSIS — I10 Essential (primary) hypertension: Secondary | ICD-10-CM

## 2019-12-11 DIAGNOSIS — N3281 Overactive bladder: Secondary | ICD-10-CM | POA: Diagnosis not present

## 2019-12-11 DIAGNOSIS — E785 Hyperlipidemia, unspecified: Secondary | ICD-10-CM

## 2019-12-11 DIAGNOSIS — Z0000 Encounter for general adult medical examination without abnormal findings: Secondary | ICD-10-CM

## 2019-12-11 DIAGNOSIS — F411 Generalized anxiety disorder: Secondary | ICD-10-CM

## 2019-12-11 LAB — LIPID PANEL
Cholesterol: 148 mg/dL (ref 0–200)
HDL: 47 mg/dL (ref >39.00)
LDL Cholesterol: 75 mg/dL (ref 0–99)
NonHDL: 100.94
Total CHOL/HDL Ratio: 3
Triglycerides: 130 mg/dL (ref 0.0–149.0)
VLDL: 26 mg/dL (ref 0.0–40.0)

## 2019-12-11 LAB — VITAMIN D 25 HYDROXY (VIT D DEFICIENCY, FRACTURES): VITD: 60.02 ng/mL (ref 30.00–100.00)

## 2019-12-11 LAB — CBC WITH DIFFERENTIAL/PLATELET
Basophils Absolute: 0.1 10*3/uL (ref 0.0–0.1)
Basophils Relative: 1.3 % (ref 0.0–3.0)
Eosinophils Absolute: 0.1 10*3/uL (ref 0.0–0.7)
Eosinophils Relative: 1.7 % (ref 0.0–5.0)
HCT: 40.4 % (ref 36.0–46.0)
Hemoglobin: 13.7 g/dL (ref 12.0–15.0)
Lymphocytes Relative: 34.4 % (ref 12.0–46.0)
Lymphs Abs: 2.2 10*3/uL (ref 0.7–4.0)
MCHC: 34 g/dL (ref 30.0–36.0)
MCV: 97.1 fl (ref 78.0–100.0)
Monocytes Absolute: 0.5 10*3/uL (ref 0.1–1.0)
Monocytes Relative: 7.5 % (ref 3.0–12.0)
Neutro Abs: 3.5 10*3/uL (ref 1.4–7.7)
Neutrophils Relative %: 55.1 % (ref 43.0–77.0)
Platelets: 272 10*3/uL (ref 150.0–400.0)
RBC: 4.16 Mil/uL (ref 3.87–5.11)
RDW: 12.5 % (ref 11.5–15.5)
WBC: 6.3 10*3/uL (ref 4.0–10.5)

## 2019-12-11 LAB — COMPREHENSIVE METABOLIC PANEL
ALT: 13 U/L (ref 0–35)
AST: 16 U/L (ref 0–37)
Albumin: 4.2 g/dL (ref 3.5–5.2)
Alkaline Phosphatase: 43 U/L (ref 39–117)
BUN: 19 mg/dL (ref 6–23)
CO2: 27 mEq/L (ref 19–32)
Calcium: 9.5 mg/dL (ref 8.4–10.5)
Chloride: 102 mEq/L (ref 96–112)
Creatinine, Ser: 0.61 mg/dL (ref 0.40–1.20)
GFR: 94.45 mL/min (ref >60.00)
Glucose, Bld: 125 mg/dL — ABNORMAL HIGH (ref 70–99)
Potassium: 4.3 mEq/L (ref 3.5–5.1)
Sodium: 140 mEq/L (ref 135–145)
Total Bilirubin: 0.6 mg/dL (ref 0.2–1.2)
Total Protein: 6.6 g/dL (ref 6.0–8.3)

## 2019-12-11 LAB — TSH: TSH: 1.31 u[IU]/mL (ref 0.35–4.50)

## 2019-12-11 LAB — VITAMIN B12: Vitamin B-12: 265 pg/mL (ref 211–911)

## 2019-12-11 LAB — HEMOGLOBIN A1C: Hgb A1c MFr Bld: 6.4 % (ref 4.6–6.5)

## 2019-12-11 MED ORDER — SIMVASTATIN 20 MG PO TABS: 20.0000 mg | ORAL_TABLET | Freq: Every day | ORAL | 0 refills | Status: DC

## 2019-12-11 MED ORDER — LISINOPRIL 5 MG PO TABS: 5.0000 mg | ORAL_TABLET | Freq: Every day | ORAL | 1 refills | Status: DC

## 2019-12-11 MED ORDER — HYDROCODONE-ACETAMINOPHEN 5-325 MG PO TABS: 1.0000 | ORAL_TABLET | Freq: Three times a day (TID) | ORAL | 0 refills | Status: DC | PRN

## 2019-12-11 MED ORDER — OXYBUTYNIN CHLORIDE ER 10 MG PO TB24: 10.0000 mg | ORAL_TABLET | Freq: Every day | ORAL | 0 refills | Status: DC

## 2019-12-11 MED ORDER — MOMETASONE FUROATE 50 MCG/ACT NA SUSP: 2.0000 | Freq: Every day | NASAL | 6 refills | Status: DC

## 2019-12-11 MED ORDER — AMLODIPINE BESYLATE 5 MG PO TABS: ORAL_TABLET | ORAL | 1 refills | Status: DC

## 2019-12-11 MED ORDER — ALPRAZOLAM 0.25 MG PO TABS: ORAL_TABLET | ORAL | 2 refills | Status: DC

## 2019-12-11 NOTE — Patient Instructions (Signed)
-Nice seeing you today!!  -Lab work today; will notify you once results are available.  -See you back in 3 months!   Preventive Care 80 Years and Older, Female Preventive care refers to lifestyle choices and visits with your health care provider that can promote health and wellness. This includes:  A yearly physical exam. This is also called an annual well check.  Regular dental and eye exams.  Immunizations.  Screening for certain conditions.  Healthy lifestyle choices, such as diet and exercise. What can I expect for my preventive care visit? Physical exam Your health care provider will check:  Height and weight. These may be used to calculate body mass index (BMI), which is a measurement that tells if you are at a healthy weight.  Heart rate and blood pressure.  Your skin for abnormal spots. Counseling Your health care provider may ask you questions about:  Alcohol, tobacco, and drug use.  Emotional well-being.  Home and relationship well-being.  Sexual activity.  Eating habits.  History of falls.  Memory and ability to understand (cognition).  Work and work Statistician.  Pregnancy and menstrual history. What immunizations do I need?  Influenza (flu) vaccine  This is recommended every year. Tetanus, diphtheria, and pertussis (Tdap) vaccine  You may need a Td booster every 10 years. Varicella (chickenpox) vaccine  You may need this vaccine if you have not already been vaccinated. Zoster (shingles) vaccine  You may need this after age 80. Pneumococcal conjugate (PCV13) vaccine  One dose is recommended after age 80. Pneumococcal polysaccharide (PPSV23) vaccine  One dose is recommended after age 80. Measles, mumps, and rubella (MMR) vaccine  You may need at least one dose of MMR if you were born in 1957 or later. You may also need a second dose. Meningococcal conjugate (MenACWY) vaccine  You may need this if you have certain  conditions. Hepatitis A vaccine  You may need this if you have certain conditions or if you travel or work in places where you may be exposed to hepatitis A. Hepatitis B vaccine  You may need this if you have certain conditions or if you travel or work in places where you may be exposed to hepatitis B. Haemophilus influenzae type b (Hib) vaccine  You may need this if you have certain conditions. You may receive vaccines as individual doses or as more than one vaccine together in one shot (combination vaccines). Talk with your health care provider about the risks and benefits of combination vaccines. What tests do I need? Blood tests  Lipid and cholesterol levels. These may be checked every 5 years, or more frequently depending on your overall health.  Hepatitis C test.  Hepatitis B test. Screening  Lung cancer screening. You may have this screening every year starting at age 80 if you have a 30-pack-year history of smoking and currently smoke or have quit within the past 15 years.  Colorectal cancer screening. All adults should have this screening starting at age 20 and continuing until age 25. Your health care provider may recommend screening at age 24 if you are at increased risk. You will have tests every 1-10 years, depending on your results and the type of screening test.  Diabetes screening. This is done by checking your blood sugar (glucose) after you have not eaten for a while (fasting). You may have this done every 1-3 years.  Mammogram. This may be done every 1-2 years. Talk with your health care provider about how often you should  have regular mammograms.  BRCA-related cancer screening. This may be done if you have a family history of breast, ovarian, tubal, or peritoneal cancers. Other tests  Sexually transmitted disease (STD) testing.  Bone density scan. This is done to screen for osteoporosis. You may have this done starting at age 65. Follow these instructions at  home: Eating and drinking  Eat a diet that includes fresh fruits and vegetables, whole grains, lean protein, and low-fat dairy products. Limit your intake of foods with high amounts of sugar, saturated fats, and salt.  Take vitamin and mineral supplements as recommended by your health care provider.  Do not drink alcohol if your health care provider tells you not to drink.  If you drink alcohol: ? Limit how much you have to 0-1 drink a day. ? Be aware of how much alcohol is in your drink. In the [place] one drink equals one 12 oz bottle of beer (355 mL), one 5 oz glass of wine (148 mL), or one 1 oz glass of hard liquor (44 mL). Lifestyle  Take daily care of your teeth and gums.  Stay active. Exercise for at least 30 minutes on 5 or more days each week.  Do not use any products that contain nicotine or tobacco, such as cigarettes, e-cigarettes, and chewing tobacco. If you need help quitting, ask your health care provider.  If you are sexually active, practice safe sex. Use a condom or other form of protection in order to prevent STIs (sexually transmitted infections).  Talk with your health care provider about taking a low-dose aspirin or statin. What's next?  Go to your health care provider once a year for a well check visit.  Ask your health care provider how often you should have your eyes and teeth checked.  Stay up to date on all vaccines. This information is not intended to replace advice given to you by your health care provider. Make sure you discuss any questions you have with your health care provider. Document Revised: 08/14/2018 Document Reviewed: 08/14/2018 Elsevier Patient Education  2020 [employer]  

## 2019-12-11 NOTE — Progress Notes (Signed)
Established Patient Office Visit     This visit occurred during the SARS-CoV-2 public health emergency.  Safety protocols were in place, including screening questions prior to the visit, additional usage of staff PPE, and extensive cleaning of exam room while observing appropriate contact time as indicated for disinfecting solutions.    CC/Reason for Visit: Annual preventive exam and subsequent Medicare wellness visit  HPI: [name] is a 79 y.o. female who is coming in today for the above mentioned reasons. Past Medical History is significant for:   1.  Hypertension for which she takes amlodipine and lisinopril, she does not have a blood pressure cuff and has not checked blood pressure recently.  2.  Obstructive sleep apnea but she is intolerant of CPAP.  3.  GERD for which she takes over-the-counter PPI daily.  4.  Overactive bladder for which she takes daily oxybutynin  5.  Hyperlipidemia for which she no longer takes medications.  6.  Depression/anxiety.  She has been on alprazolam 0.25 mg twice daily for at least 8 to 10 years.  7.  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 Alvan Dame 3 years ago.  She has been maintained on hydrocodone 5/325 mg 1 tablet 3 times a day.  8.  Impaired glucose tolerance which is currently diet-controlled.  Since I last saw her she has been doing well, she has received both of her Covid vaccines.  She has routine eye and dental care.  She has elected to not pursue further cancer screening.   Past Medical/Surgical History: Past Medical History:  Diagnosis Date  . Allergy   . Anxiety   . Arthritis    PAIN AND OA RIGHT KNEE; PAIN AND DDD AND SPINAL STENOSIS  . Complication of anesthesia    PULLED OUT MY IV'S AND FOLEY - HALLUCINATIONS WITH HIATAL HERNIA SURGERY  . Depression   . Diverticulosis    PER COLONOSCOPY  . GERD (gastroesophageal reflux disease)    OVER THE COUNTER MEDS IF  NEEDED  . Heart murmur    "VERY SLIGHT ALL MY LIFE"  . Hyperlipidemia   . Hypertension   . Sleep apnea    does not tolerate CPAP - HAS NOT USED IN OVER 2 YRS    Past Surgical History:  Procedure Laterality Date  . COLONOSCOPY    . HIATAL HERNIA REPAIR  2008  . LUMBAR FUSION    . LUMBAR LAMINECTOMY    . PARTIAL KNEE ARTHROPLASTY Right 07/02/2014   Procedure: RIGHT MEDIAL UNICOMPARTMENTAL KNEE;  Surgeon: Mauri Pole, MD;  Location: [facility];  Service: Orthopedics;  Laterality: Right;  . UPPER GASTROINTESTINAL ENDOSCOPY      Social History:  reports that she has never smoked. She has never used smokeless tobacco. She reports that she does not drink alcohol or use drugs.  Allergies: Allergies  Allergen Reactions  . Ambien [Zolpidem Tartrate]     amnesia  . Hydrochlorothiazide     Other reaction(s): Lethargy (intolerance)  . Prednisone     Rapid heart rate  . Thiazide-Type Diuretics Other (See Comments)    "could not physically move"  . Sulfamethoxazole Rash    Family History:  Family History  Problem Relation Age of Onset  . Heart disease Mother   . Heart disease Father   . Heart disease Sister   . Pulmonary embolism Sister   . Diabetes Paternal Grandfather      Current Outpatient  Medications:  .  acetaminophen (TYLENOL 8 HOUR) 650 MG CR tablet, Take 650 mg by mouth every 8 (eight) hours as needed for pain., Disp: , Rfl:  .  ALPRAZolam (XANAX) 0.25 MG tablet, TAKE (1) TABLET TWICE DAILY AS NEEDED FOR ANXIETY., Disp: 60 tablet, Rfl: 2 .  amLODipine (NORVASC) 5 MG tablet, TAKE (1) TABLET DAILY FOR HIGH BLOOD PRESSURE., Disp: 90 tablet, Rfl: 1 .  calcium & magnesium carbonates (MYLANTA) 892-119 MG tablet, Take 1 tablet by mouth daily., Disp: , Rfl:  .  famotidine (PEPCID) 20 MG tablet, Take 20 mg by mouth daily., Disp: , Rfl:  .  HYDROcodone-acetaminophen (NORCO/VICODIN) 5-325 MG tablet, Take 1 tablet by mouth 3 (three) times daily as needed for moderate pain., Disp:  90 tablet, Rfl: 0 .  HYDROcodone-acetaminophen (NORCO/VICODIN) 5-325 MG tablet, Take 1 tablet by mouth 3 (three) times daily as needed for moderate pain., Disp: 90 tablet, Rfl: 0 .  HYDROcodone-acetaminophen (NORCO/VICODIN) 5-325 MG tablet, Take 1 tablet by mouth 3 (three) times daily as needed for moderate pain., Disp: 90 tablet, Rfl: 0 .  lisinopril (ZESTRIL) 5 MG tablet, Take 1 tablet (5 mg total) by mouth daily., Disp: 90 tablet, Rfl: 1 .  mometasone (NASONEX) 50 MCG/ACT nasal spray, Place 2 sprays into the nose at bedtime., Disp: 17 g, Rfl: 6 .  oxybutynin (DITROPAN-XL) 10 MG 24 hr tablet, Take 1 tablet (10 mg total) by mouth daily., Disp: 90 tablet, Rfl: 0 .  polyvinyl alcohol (LIQUIFILM TEARS) 1.4 % ophthalmic solution, Place 1-2 drops into both eyes daily as needed for dry eyes., Disp: , Rfl:  .  simvastatin (ZOCOR) 20 MG tablet, Take 1 tablet (20 mg total) by mouth at bedtime., Disp: 90 tablet, Rfl: 0 .  triamcinolone cream (KENALOG) 0.1 %, APPLY TO RASH SPARINGLY ONCE A DAY., Disp: 30 g, Rfl: 0  Review of Systems:  Constitutional: Denies fever, chills, diaphoresis, appetite change and fatigue.  HEENT: Denies photophobia, eye pain, redness, hearing loss, ear pain, congestion, sore throat, rhinorrhea, sneezing, mouth sores, trouble swallowing, neck pain, neck stiffness and tinnitus.   Respiratory: Denies SOB, DOE, cough, chest tightness,  and wheezing.   Cardiovascular: Denies chest pain, palpitations and leg swelling.  Gastrointestinal: Denies nausea, vomiting, abdominal pain, diarrhea, constipation, blood in stool and abdominal distention.  Genitourinary: Denies dysuria, urgency, frequency, hematuria, flank pain and difficulty urinating.  Endocrine: Denies: hot or cold intolerance, sweats, changes in hair or nails, polyuria, polydipsia. Musculoskeletal: Denies myalgias, back pain, joint swelling, arthralgias and gait problem.  Skin: Denies pallor, rash and wound.  Neurological: Denies  dizziness, seizures, syncope, weakness, light-headedness, numbness and headaches.  Hematological: Denies adenopathy. Easy bruising, personal or family bleeding history  Psychiatric/Behavioral: Denies suicidal ideation, mood changes, confusion, nervousness, sleep disturbance and agitation    Physical Exam: Vitals:   12/11/19 1111  BP: 124/80  Pulse: 87  Temp: (!) 97 F (36.1 C)  TempSrc: Temporal  SpO2: 98%  Weight: 189 lb 3.2 oz (85.8 kg)  Height: '5\' 3"'$  (1.6 m)    Body mass index is 33.52 kg/m.   Constitutional: NAD, calm, comfortable Eyes: PERRL, lids and conjunctivae normal, wears corrective lenses ENMT: Mucous membranes are moist.  Tympanic membrane is pearly white, no erythema or bulging. Neck: normal, supple, no masses, no thyromegaly Respiratory: clear to auscultation bilaterally, no wheezing, no crackles. Normal respiratory effort. No accessory muscle use.  Cardiovascular: Regular rate and rhythm, no murmurs / rubs / gallops. No extremity edema. 2+ pedal pulses. No  carotid bruits.  Abdomen: no tenderness, no masses palpated. No hepatosplenomegaly. Bowel sounds positive.  Musculoskeletal: no clubbing / cyanosis. No joint deformity upper and lower extremities. Good ROM, no contractures. Normal muscle tone.  Skin: no rashes, lesions, ulcers. No induration Neurologic: CN 2-12 grossly intact. Sensation intact, DTR normal. Strength 5/5 in all 4.  Psychiatric: Normal judgment and insight. Alert and oriented x 3. Normal mood.    Subsequent Medicare wellness visit   1. Risk factors, based on past  M,S,F -cardiovascular disease risk factors include age, history of hypertension, history of hyperlipidemia   2.  Physical activities: She is very sedentary   3.  Depression/mood:  Stable not depressed   4.  Hearing:  No perceived issues   5.  ADL's: Independent in all ADLs   6.  Fall risk:  Low fall risk   7.  Home safety: No problems identified   8.  Height weight, and  visual acuity: Height and weight as above, visual acuity is 20/20 with each eye independently and together   9.  Counseling:  Advised to incorporate 30 minutes of moderate intensity aerobic activity at least 3 times a week   10. Lab orders based on risk factors: Laboratory update will be reviewed   11. Referral :  None today   12. Care plan:  Follow-up with me in 3 months   13. Cognitive assessment:  No cognitive impairment   14. Screening: Patient provided with a written and personalized 5-10 year screening schedule in the AVS.   yes   15. Provider List Update:   PCP only  16. Advance Directives: Full code     Office Visit from 12/11/2019 in [facility] [place]  PHQ-9 Total Score  2      Fall Risk  12/11/2019 07/23/2019 10/21/2015 02/12/2014 04/03/2013  Falls in the past year? 1 0 No No No  Number falls in past yr: 1 0 - - -  Injury with Fall? 0 0 - - -  Risk for fall due to : - - - Impaired balance/gait;Impaired mobility;Medication side effect -     Impression and Plan:  Encounter for preventive health examination -She has routine eye and dental care. -She has received both of her Covid vaccines, she is due for tetanus booster but will wait 6 weeks after her last Covid to obtain. -Screening labs today. -Healthy lifestyle has been discussed in detail. -She has elected to defer all cancer screening due to her age, I agree.  Generalized anxiety disorder  - Plan: ALPRAZolam (XANAX) 0.25 MG tablet  Chronic pain syndrome  High risk medication use  -PDMP has been reviewed, no red flags, overdose risk score is 120. -Refill hydrocodone x3 months.  Essential hypertension  -Well-controlled.  Overactive bladder  -Refill oxybutynin.  Hyperlipidemia, unspecified hyperlipidemia type  - Plan: simvastatin (ZOCOR) 20 MG tablet, Lipid panel  Obesity (BMI 30.0-34.9) -Discussed healthy lifestyle, including increased physical activity and better food choices to promote  weight loss.    Patient Instructions  -Nice seeing you today!!  -Lab work today; will notify you once results are available.  -See you back in 3 months!   Preventive Care 4 Years and Older, Female Preventive care refers to lifestyle choices and visits with your health care provider that can promote health and wellness. This includes:  A yearly physical exam. This is also called an annual well check.  Regular dental and eye exams.  Immunizations.  Screening for certain conditions.  Healthy lifestyle  choices, such as diet and exercise. What can I expect for my preventive care visit? Physical exam Your health care provider will check:  Height and weight. These may be used to calculate body mass index (BMI), which is a measurement that tells if you are at a healthy weight.  Heart rate and blood pressure.  Your skin for abnormal spots. Counseling Your health care provider may ask you questions about:  Alcohol, tobacco, and drug use.  Emotional well-being.  Home and relationship well-being.  Sexual activity.  Eating habits.  History of falls.  Memory and ability to understand (cognition).  Work and work Statistician.  Pregnancy and menstrual history. What immunizations do I need?  Influenza (flu) vaccine  This is recommended every year. Tetanus, diphtheria, and pertussis (Tdap) vaccine  You may need a Td booster every 10 years. Varicella (chickenpox) vaccine  You may need this vaccine if you have not already been vaccinated. Zoster (shingles) vaccine  You may need this after age 70. Pneumococcal conjugate (PCV13) vaccine  One dose is recommended after age 70. Pneumococcal polysaccharide (PPSV23) vaccine  One dose is recommended after age 91. Measles, mumps, and rubella (MMR) vaccine  You may need at least one dose of MMR if you were born in 1957 or later. You may also need a second dose. Meningococcal conjugate (MenACWY) vaccine  You may need  this if you have certain conditions. Hepatitis A vaccine  You may need this if you have certain conditions or if you travel or work in places where you may be exposed to hepatitis A. Hepatitis B vaccine  You may need this if you have certain conditions or if you travel or work in places where you may be exposed to hepatitis B. Haemophilus influenzae type b (Hib) vaccine  You may need this if you have certain conditions. You may receive vaccines as individual doses or as more than one vaccine together in one shot (combination vaccines). Talk with your health care provider about the risks and benefits of combination vaccines. What tests do I need? Blood tests  Lipid and cholesterol levels. These may be checked every 5 years, or more frequently depending on your overall health.  Hepatitis C test.  Hepatitis B test. Screening  Lung cancer screening. You may have this screening every year starting at age 67 if you have a 30-pack-year history of smoking and currently smoke or have quit within the past 15 years.  Colorectal cancer screening. All adults should have this screening starting at age 87 and continuing until age 80. Your health care provider may recommend screening at age 72 if you are at increased risk. You will have tests every 1-10 years, depending on your results and the type of screening test.  Diabetes screening. This is done by checking your blood sugar (glucose) after you have not eaten for a while (fasting). You may have this done every 1-3 years.  Mammogram. This may be done every 1-2 years. Talk with your health care provider about how often you should have regular mammograms.  BRCA-related cancer screening. This may be done if you have a family history of breast, ovarian, tubal, or peritoneal cancers. Other tests  Sexually transmitted disease (STD) testing.  Bone density scan. This is done to screen for osteoporosis. You may have this done starting at age 76. Follow  these instructions at home: Eating and drinking  Eat a diet that includes fresh fruits and vegetables, whole grains, lean protein, and low-fat dairy products. Limit your  intake of foods with high amounts of sugar, saturated fats, and salt.  Take vitamin and mineral supplements as recommended by your health care provider.  Do not drink alcohol if your health care provider tells you not to drink.  If you drink alcohol: ? Limit how much you have to 0-1 drink a day. ? Be aware of how much alcohol is in your drink. In the [place] one drink equals one 12 oz bottle of beer (355 mL), one 5 oz glass of wine (148 mL), or one 1 oz glass of hard liquor (44 mL). Lifestyle  Take daily care of your teeth and gums.  Stay active. Exercise for at least 30 minutes on 5 or more days each week.  Do not use any products that contain nicotine or tobacco, such as cigarettes, e-cigarettes, and chewing tobacco. If you need help quitting, ask your health care provider.  If you are sexually active, practice safe sex. Use a condom or other form of protection in order to prevent STIs (sexually transmitted infections).  Talk with your health care provider about taking a low-dose aspirin or statin. What's next?  Go to your health care provider once a year for a well check visit.  Ask your health care provider how often you should have your eyes and teeth checked.  Stay up to date on all vaccines. This information is not intended to replace advice given to you by your health care provider. Make sure you discuss any questions you have with your health care provider. Document Revised: 08/14/2018 Document Reviewed: 08/14/2018 Elsevier Patient Education  2020 [place], MD [facility]

## 2020-01-18 ENCOUNTER — Other Ambulatory Visit: Payer: Self-pay | Admitting: Internal Medicine

## 2020-01-18 DIAGNOSIS — F411 Generalized anxiety disorder: Secondary | ICD-10-CM

## 2020-02-12 ENCOUNTER — Encounter: Payer: Self-pay | Admitting: Internal Medicine

## 2020-02-21 ENCOUNTER — Other Ambulatory Visit: Payer: Self-pay | Admitting: Internal Medicine

## 2020-02-21 DIAGNOSIS — G894 Chronic pain syndrome: Secondary | ICD-10-CM

## 2020-02-21 DIAGNOSIS — Z79899 Other long term (current) drug therapy: Secondary | ICD-10-CM

## 2020-02-21 DIAGNOSIS — N3281 Overactive bladder: Secondary | ICD-10-CM

## 2020-02-23 NOTE — Telephone Encounter (Signed)
Pleased deny.  Refill should be at the pharmacy.

## 2020-02-24 NOTE — Telephone Encounter (Signed)
Spoke with pharmacist at [facility] and a prescription will be filled today. Left message on machine for patient to return our call.

## 2020-02-24 NOTE — Telephone Encounter (Signed)
Please see patient question. 

## 2020-02-25 NOTE — Telephone Encounter (Signed)
Left detailed message on machine for patient that refill is ready to pick up.

## 2020-02-26 DIAGNOSIS — Z96651 Presence of right artificial knee joint: Secondary | ICD-10-CM | POA: Diagnosis not present

## 2020-02-26 DIAGNOSIS — M170 Bilateral primary osteoarthritis of knee: Secondary | ICD-10-CM | POA: Diagnosis not present

## 2020-02-26 DIAGNOSIS — M25561 Pain in right knee: Secondary | ICD-10-CM | POA: Diagnosis not present

## 2020-02-26 DIAGNOSIS — M25562 Pain in left knee: Secondary | ICD-10-CM | POA: Diagnosis not present

## 2020-02-26 DIAGNOSIS — M1712 Unilateral primary osteoarthritis, left knee: Secondary | ICD-10-CM | POA: Diagnosis not present

## 2020-02-26 DIAGNOSIS — M1711 Unilateral primary osteoarthritis, right knee: Secondary | ICD-10-CM | POA: Diagnosis not present

## 2020-03-11 ENCOUNTER — Ambulatory Visit (INDEPENDENT_AMBULATORY_CARE_PROVIDER_SITE_OTHER): Payer: Medicare Other | Admitting: Internal Medicine

## 2020-03-11 ENCOUNTER — Other Ambulatory Visit: Payer: Self-pay

## 2020-03-11 ENCOUNTER — Encounter: Payer: Self-pay | Admitting: Internal Medicine

## 2020-03-11 VITALS — BP 124/80 | HR 75 | Temp 98.5°F | Wt 185.3 lb

## 2020-03-11 DIAGNOSIS — M159 Polyosteoarthritis, unspecified: Secondary | ICD-10-CM

## 2020-03-11 DIAGNOSIS — E785 Hyperlipidemia, unspecified: Secondary | ICD-10-CM

## 2020-03-11 DIAGNOSIS — E119 Type 2 diabetes mellitus without complications: Secondary | ICD-10-CM | POA: Diagnosis not present

## 2020-03-11 DIAGNOSIS — Z79899 Other long term (current) drug therapy: Secondary | ICD-10-CM

## 2020-03-11 DIAGNOSIS — M8949 Other hypertrophic osteoarthropathy, multiple sites: Secondary | ICD-10-CM

## 2020-03-11 DIAGNOSIS — N3281 Overactive bladder: Secondary | ICD-10-CM

## 2020-03-11 DIAGNOSIS — F411 Generalized anxiety disorder: Secondary | ICD-10-CM

## 2020-03-11 DIAGNOSIS — I10 Essential (primary) hypertension: Secondary | ICD-10-CM

## 2020-03-11 DIAGNOSIS — E669 Obesity, unspecified: Secondary | ICD-10-CM

## 2020-03-11 DIAGNOSIS — K219 Gastro-esophageal reflux disease without esophagitis: Secondary | ICD-10-CM

## 2020-03-11 DIAGNOSIS — G894 Chronic pain syndrome: Secondary | ICD-10-CM

## 2020-03-11 LAB — POCT GLYCOSYLATED HEMOGLOBIN (HGB A1C): Hemoglobin A1C: 6.5 % — AB (ref 4.0–5.6)

## 2020-03-11 MED ORDER — ALPRAZOLAM 0.25 MG PO TABS: 0.2500 mg | ORAL_TABLET | Freq: Two times a day (BID) | ORAL | 0 refills | Status: DC | PRN

## 2020-03-11 MED ORDER — HYDROCODONE-ACETAMINOPHEN 5-325 MG PO TABS: 1.0000 | ORAL_TABLET | Freq: Three times a day (TID) | ORAL | 0 refills | Status: DC | PRN

## 2020-03-11 MED ORDER — FAMOTIDINE 20 MG PO TABS: 20.0000 mg | ORAL_TABLET | Freq: Every day | ORAL | 1 refills | Status: DC

## 2020-03-11 MED ORDER — MOMETASONE FUROATE 50 MCG/ACT NA SUSP: 2.0000 | Freq: Every day | NASAL | 6 refills | Status: DC

## 2020-03-11 MED ORDER — SIMVASTATIN 20 MG PO TABS: 20.0000 mg | ORAL_TABLET | Freq: Every day | ORAL | 1 refills | Status: DC

## 2020-03-11 MED ORDER — LISINOPRIL 5 MG PO TABS: 5.0000 mg | ORAL_TABLET | Freq: Every day | ORAL | 1 refills | Status: DC

## 2020-03-11 MED ORDER — AMLODIPINE BESYLATE 5 MG PO TABS: ORAL_TABLET | ORAL | 1 refills | Status: DC

## 2020-03-11 NOTE — Progress Notes (Signed)
Established Patient Office Visit     This visit occurred during the SARS-CoV-2 public health emergency.  Safety protocols were in place, including screening questions prior to the visit, additional usage of staff PPE, and extensive cleaning of exam room while observing appropriate contact time as indicated for disinfecting solutions.    CC/Reason for Visit: 58-month follow-up chronic conditions, medication refills  HPI: [name] is a 79 y.o. female who is coming in today for the above mentioned reasons. Past Medical History is significant for:   1. Hypertension for which she takes amlodipine and lisinopril, she does not have a blood pressure cuff and has not checked blood pressure recently.  2. Obstructive sleep apnea but she is intolerant of CPAP.  3. GERD for which she takes over-the-counter PPI daily.  4. Overactive bladder for which she takes daily oxybutynin  5.Hyperlipidemia for which she no longer takes medications.  6. Depression/anxiety. She has been on alprazolam 0.25 mg twice daily for at least 8 to 10 years.  7. 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 Alvan Dame 3 years ago. She has been maintained on hydrocodone 5/325 mg 1 tablet 3 times a day.  8. Impaired glucose tolerance which is currently diet-controlled.  She had an A1c of 6.4 in April.  She has been doing well since we last spoke, her husband's health continues to decline.   Past Medical/Surgical History: Past Medical History:  Diagnosis Date  . Allergy   . Anxiety   . Arthritis    PAIN AND OA RIGHT KNEE; PAIN AND DDD AND SPINAL STENOSIS  . Complication of anesthesia    PULLED OUT MY IV'S AND FOLEY - HALLUCINATIONS WITH HIATAL HERNIA SURGERY  . Depression   . Diverticulosis    PER COLONOSCOPY  . GERD (gastroesophageal reflux disease)    OVER THE COUNTER MEDS IF NEEDED  . Heart murmur    "VERY SLIGHT ALL MY LIFE"  .  Hyperlipidemia   . Hypertension   . Sleep apnea    does not tolerate CPAP - HAS NOT USED IN OVER 2 YRS    Past Surgical History:  Procedure Laterality Date  . COLONOSCOPY    . HIATAL HERNIA REPAIR  2008  . LUMBAR FUSION    . LUMBAR LAMINECTOMY    . PARTIAL KNEE ARTHROPLASTY Right 07/02/2014   Procedure: RIGHT MEDIAL UNICOMPARTMENTAL KNEE;  Surgeon: Mauri Pole, MD;  Location: [facility];  Service: Orthopedics;  Laterality: Right;  . UPPER GASTROINTESTINAL ENDOSCOPY      Social History:  reports that she has never smoked. She has never used smokeless tobacco. She reports that she does not drink alcohol and does not use drugs.  Allergies: Allergies  Allergen Reactions  . Ambien [Zolpidem Tartrate]     amnesia  . Hydrochlorothiazide     Other reaction(s): Lethargy (intolerance)  . Prednisone     Rapid heart rate  . Thiazide-Type Diuretics Other (See Comments)    "could not physically move"  . Sulfamethoxazole Rash    Family History:  Family History  Problem Relation Age of Onset  . Heart disease Mother   . Heart disease Father   . Heart disease Sister   . Pulmonary embolism Sister   . Diabetes Paternal Grandfather      Current Outpatient Medications:  .  acetaminophen (TYLENOL 8 HOUR) 650 MG CR tablet, Take 650 mg by mouth every 8 (eight) hours as needed  for pain., Disp: , Rfl:  .  ALPRAZolam (XANAX) 0.25 MG tablet, Take 1 tablet (0.25 mg total) by mouth 2 (two) times daily as needed for anxiety., Disp: 60 tablet, Rfl: 0 .  amLODipine (NORVASC) 5 MG tablet, TAKE (1) TABLET DAILY FOR HIGH BLOOD PRESSURE., Disp: 90 tablet, Rfl: 1 .  calcium & magnesium carbonates (MYLANTA) 607-371 MG tablet, Take 1 tablet by mouth daily., Disp: , Rfl:  .  famotidine (PEPCID) 20 MG tablet, Take 1 tablet (20 mg total) by mouth daily., Disp: 90 tablet, Rfl: 1 .  HYDROcodone-acetaminophen (NORCO/VICODIN) 5-325 MG tablet, Take 1 tablet by mouth 3 (three) times daily as needed for moderate  pain., Disp: 90 tablet, Rfl: 0 .  HYDROcodone-acetaminophen (NORCO/VICODIN) 5-325 MG tablet, Take 1 tablet by mouth 3 (three) times daily as needed for moderate pain., Disp: 90 tablet, Rfl: 0 .  lisinopril (ZESTRIL) 5 MG tablet, Take 1 tablet (5 mg total) by mouth daily., Disp: 90 tablet, Rfl: 1 .  mometasone (NASONEX) 50 MCG/ACT nasal spray, Place 2 sprays into the nose at bedtime., Disp: 17 g, Rfl: 6 .  oxybutynin (DITROPAN-XL) 10 MG 24 hr tablet, Take 1 tablet (10 mg total) by mouth daily., Disp: 90 tablet, Rfl: 0 .  polyvinyl alcohol (LIQUIFILM TEARS) 1.4 % ophthalmic solution, Place 1-2 drops into both eyes daily as needed for dry eyes., Disp: , Rfl:  .  simvastatin (ZOCOR) 20 MG tablet, Take 1 tablet (20 mg total) by mouth at bedtime., Disp: 90 tablet, Rfl: 1 .  triamcinolone cream (KENALOG) 0.1 %, APPLY TO RASH SPARINGLY ONCE A DAY., Disp: 30 g, Rfl: 0 .  HYDROcodone-acetaminophen (NORCO/VICODIN) 5-325 MG tablet, Take 1 tablet by mouth 3 (three) times daily as needed for moderate pain., Disp: 90 tablet, Rfl: 0  Review of Systems:  Constitutional: Denies fever, chills, diaphoresis, appetite change and fatigue.  HEENT: Denies photophobia, eye pain, redness, hearing loss, ear pain, congestion, sore throat, rhinorrhea, sneezing, mouth sores, trouble swallowing, neck pain, neck stiffness and tinnitus.   Respiratory: Denies SOB, DOE, cough, chest tightness,  and wheezing.   Cardiovascular: Denies chest pain, palpitations and leg swelling.  Gastrointestinal: Denies nausea, vomiting, abdominal pain, diarrhea, constipation, blood in stool and abdominal distention.  Genitourinary: Denies dysuria, urgency, frequency, hematuria, flank pain and difficulty urinating.  Endocrine: Denies: hot or cold intolerance, sweats, changes in hair or nails, polyuria, polydipsia. Musculoskeletal: Denies myalgias, back pain, joint swelling, arthralgias and gait problem.  Skin: Denies pallor, rash and wound.    Neurological: Denies dizziness, seizures, syncope, weakness, light-headedness, numbness and headaches.  Hematological: Denies adenopathy. Easy bruising, personal or family bleeding history  Psychiatric/Behavioral: Denies suicidal ideation, mood changes, confusion, nervousness, sleep disturbance and agitation    Physical Exam: Vitals:   03/11/20 1048  BP: 124/80  Pulse: 75  Temp: 98.5 F (36.9 C)  TempSrc: Temporal  SpO2: 97%  Weight: 185 lb 4.8 oz (84.1 kg)    Body mass index is 32.82 kg/m.   Constitutional: NAD, calm, comfortable Eyes: PERRL, lids and conjunctivae normal, wears corrective lenses ENMT: Mucous membranes are moist.  Respiratory: clear to auscultation bilaterally, no wheezing, no crackles. Normal respiratory effort. No accessory muscle use.  Cardiovascular: Regular rate and rhythm, no murmurs / rubs / gallops. No extremity edema.  Neurologic: Grossly intact and nonfocal. Psychiatric: Normal judgment and insight. Alert and oriented x 3. Normal mood.    Impression and Plan:  Type 2 diabetes mellitus without complication, without long-term current use of insulin ([place])  -  This is a new diagnosis that we are making today due to an A1c of 6.5. -I will refer her for new diabetes education class, we have discussed lifestyle modifications, need for weight loss. -I will observe her off of medications for now.  Chronic pain syndrome High risk medication use  Primary osteoarthritis involving multiple joints -PDMP reviewed, no red flags, overdose risk score 100. -Refill hydrocodone 5/325 mg 90 tablets a month for 3 months.   Essential hypertension  - Plan: amLODipine (NORVASC) 5 MG tablet, lisinopril (ZESTRIL) 5 MG tablet -Well-controlled.  Generalized anxiety disorder  - Plan: ALPRAZolam (XANAX) 0.25 MG tablet   Hyperlipidemia, unspecified hyperlipidemia type  - Plan: simvastatin (ZOCOR) 20 MG tablet -Last LDL was 75 in April 21.  Gastroesophageal reflux  disease, unspecified whether esophagitis present  - Plan: famotidine (PEPCID) 20 MG tablet, well-controlled  Obesity (BMI 30-39.9) -Discussed healthy lifestyle, including increased physical activity and better food choices to promote weight loss.     Patient Instructions  -Nice seeing you today!!  -You have been diagnosed with Type 2 diabetes. Will send you to a new diabetes education class.  -Schedule follow up in 3 months.     Lelon Frohlich, MD [facility]

## 2020-03-11 NOTE — Patient Instructions (Signed)
-  Nice seeing you today!!  -You have been diagnosed with Type 2 diabetes. Will send you to a new diabetes education class.  -Schedule follow up in 3 months.

## 2020-03-17 DIAGNOSIS — L814 Other melanin hyperpigmentation: Secondary | ICD-10-CM | POA: Diagnosis not present

## 2020-03-17 DIAGNOSIS — L821 Other seborrheic keratosis: Secondary | ICD-10-CM | POA: Diagnosis not present

## 2020-03-17 DIAGNOSIS — D229 Melanocytic nevi, unspecified: Secondary | ICD-10-CM | POA: Diagnosis not present

## 2020-03-17 DIAGNOSIS — D1801 Hemangioma of skin and subcutaneous tissue: Secondary | ICD-10-CM | POA: Diagnosis not present

## 2020-03-25 ENCOUNTER — Telehealth (INDEPENDENT_AMBULATORY_CARE_PROVIDER_SITE_OTHER): Payer: Medicare Other | Admitting: Internal Medicine

## 2020-03-25 ENCOUNTER — Other Ambulatory Visit: Payer: Self-pay

## 2020-03-25 DIAGNOSIS — R197 Diarrhea, unspecified: Secondary | ICD-10-CM

## 2020-03-25 NOTE — Progress Notes (Signed)
Virtual Visit via Video Note  I connected with [name] on 03/25/20 at  9:00 AM EDT by a video enabled telemedicine application and verified that I am speaking with the correct person using two identifiers.  Location patient: home Location provider: work office Persons participating in the virtual visit: patient, provider  I discussed the limitations of evaluation and management by telemedicine and the availability of in person appointments. The patient expressed understanding and agreed to proceed.   HPI: [name] has scheduled this visit to discuss diarrhea.  On Saturday she went to a funeral out of town.  People were not wearing masks.  They did have food at the funeral.  On Monday, 2 days later, she started having watery, green diarrhea every 2 hours and this has now persisted for 5 days.  Today the consistency is changing to more of a jelly type.  She has not had fever, nausea or vomiting, no abdominal pain, no cramping.  She has increased her fluid intake.  She has not had any recent antibiotics.  She did complete her Covid vaccination series in February.  No URI symptoms.   ROS: Constitutional: Denies fever, chills, diaphoresis, appetite change and fatigue.  HEENT: Denies photophobia, eye pain, redness, hearing loss, ear pain, congestion, sore throat, rhinorrhea, sneezing, mouth sores, trouble swallowing, neck pain, neck stiffness and tinnitus.   Respiratory: Denies SOB, DOE, cough, chest tightness,  and wheezing.   Cardiovascular: Denies chest pain, palpitations and leg swelling.  Gastrointestinal: Denies nausea, vomiting, abdominal pain, diarrhea, constipation, blood in stool and abdominal distention.  Genitourinary: Denies dysuria, urgency, frequency, hematuria, flank pain and difficulty urinating.  Endocrine: Denies: hot or cold intolerance, sweats, changes in hair or nails, polyuria, polydipsia. Musculoskeletal: Denies myalgias, back pain, joint swelling, arthralgias  and gait problem.  Skin: Denies pallor, rash and wound.  Neurological: Denies dizziness, seizures, syncope, weakness, light-headedness, numbness and headaches.  Hematological: Denies adenopathy. Easy bruising, personal or family bleeding history  Psychiatric/Behavioral: Denies suicidal ideation, mood changes, confusion, nervousness, sleep disturbance and agitation   Past Medical History:  Diagnosis Date  . Allergy   . Anxiety   . Arthritis    PAIN AND OA RIGHT KNEE; PAIN AND DDD AND SPINAL STENOSIS  . Complication of anesthesia    PULLED OUT MY IV'S AND FOLEY - HALLUCINATIONS WITH HIATAL HERNIA SURGERY  . Depression   . Diverticulosis    PER COLONOSCOPY  . GERD (gastroesophageal reflux disease)    OVER THE COUNTER MEDS IF NEEDED  . Heart murmur    "VERY SLIGHT ALL MY LIFE"  . Hyperlipidemia   . Hypertension   . Sleep apnea    does not tolerate CPAP - HAS NOT USED IN OVER 2 YRS    Past Surgical History:  Procedure Laterality Date  . COLONOSCOPY    . HIATAL HERNIA REPAIR  2008  . LUMBAR FUSION    . LUMBAR LAMINECTOMY    . PARTIAL KNEE ARTHROPLASTY Right 07/02/2014   Procedure: RIGHT MEDIAL UNICOMPARTMENTAL KNEE;  Surgeon: Mauri Pole, MD;  Location: [facility];  Service: Orthopedics;  Laterality: Right;  . UPPER GASTROINTESTINAL ENDOSCOPY      Family History  Problem Relation Age of Onset  . Heart disease Mother   . Heart disease Father   . Heart disease Sister   . Pulmonary embolism Sister   . Diabetes Paternal Grandfather     SOCIAL HX:   reports that she has never smoked. She has never used  smokeless tobacco. She reports that she does not drink alcohol and does not use drugs.   Current Outpatient Medications:  .  acetaminophen (TYLENOL 8 HOUR) 650 MG CR tablet, Take 650 mg by mouth every 8 (eight) hours as needed for pain., Disp: , Rfl:  .  ALPRAZolam (XANAX) 0.25 MG tablet, Take 1 tablet (0.25 mg total) by mouth 2 (two) times daily as needed for anxiety., Disp:  60 tablet, Rfl: 0 .  amLODipine (NORVASC) 5 MG tablet, TAKE (1) TABLET DAILY FOR HIGH BLOOD PRESSURE., Disp: 90 tablet, Rfl: 1 .  calcium & magnesium carbonates (MYLANTA) 789-381 MG tablet, Take 1 tablet by mouth daily., Disp: , Rfl:  .  famotidine (PEPCID) 20 MG tablet, Take 1 tablet (20 mg total) by mouth daily., Disp: 90 tablet, Rfl: 1 .  HYDROcodone-acetaminophen (NORCO/VICODIN) 5-325 MG tablet, Take 1 tablet by mouth 3 (three) times daily as needed for moderate pain., Disp: 90 tablet, Rfl: 0 .  HYDROcodone-acetaminophen (NORCO/VICODIN) 5-325 MG tablet, Take 1 tablet by mouth 3 (three) times daily as needed for moderate pain., Disp: 90 tablet, Rfl: 0 .  HYDROcodone-acetaminophen (NORCO/VICODIN) 5-325 MG tablet, Take 1 tablet by mouth 3 (three) times daily as needed for moderate pain., Disp: 90 tablet, Rfl: 0 .  lisinopril (ZESTRIL) 5 MG tablet, Take 1 tablet (5 mg total) by mouth daily., Disp: 90 tablet, Rfl: 1 .  mometasone (NASONEX) 50 MCG/ACT nasal spray, Place 2 sprays into the nose at bedtime., Disp: 17 g, Rfl: 6 .  oxybutynin (DITROPAN-XL) 10 MG 24 hr tablet, Take 1 tablet (10 mg total) by mouth daily., Disp: 90 tablet, Rfl: 0 .  polyvinyl alcohol (LIQUIFILM TEARS) 1.4 % ophthalmic solution, Place 1-2 drops into both eyes daily as needed for dry eyes., Disp: , Rfl:  .  simvastatin (ZOCOR) 20 MG tablet, Take 1 tablet (20 mg total) by mouth at bedtime., Disp: 90 tablet, Rfl: 1 .  triamcinolone cream (KENALOG) 0.1 %, APPLY TO RASH SPARINGLY ONCE A DAY., Disp: 30 g, Rfl: 0  EXAM:   VITALS per patient if applicable: None reported  GENERAL: alert, oriented, appears well and in no acute distress  HEENT: atraumatic, conjunttiva clear, no obvious abnormalities on inspection of external nose and ears, wears corrective lenses  NECK: normal movements of the head and neck  LUNGS: on inspection no signs of respiratory distress, breathing rate appears normal, no obvious gross increased work of  breathing, gasping or wheezing  CV: no obvious cyanosis  MS: moves all visible extremities without noticeable abnormality  PSYCH/NEURO: pleasant and cooperative, no obvious depression or anxiety, speech and thought processing grossly intact  ASSESSMENT AND PLAN:   Diarrhea of presumed infectious origin -Presumed viral etiology, cannot rule out Covid. -Food poisoning also possibility. -Since she is not vomiting and is able to tolerate liquids, have recommended bland diet, stay at home. -She will go to [facility] to get a Covid test and call us with her results for further recommendations.    I discussed the assessment and treatment plan with the patient. The patient was provided an opportunity to ask questions and all were answered. The patient agreed with the plan and demonstrated an understanding of the instructions.   The patient was advised to call back or seek an in-person evaluation if the symptoms worsen or if the condition fails to improve as anticipated.    Lelon Frohlich, MD  [facility]

## 2020-03-26 DIAGNOSIS — Z20822 Contact with and (suspected) exposure to covid-19: Secondary | ICD-10-CM | POA: Diagnosis not present

## 2020-03-28 ENCOUNTER — Encounter: Payer: Self-pay | Admitting: Internal Medicine

## 2020-03-30 ENCOUNTER — Other Ambulatory Visit: Payer: Self-pay | Admitting: Internal Medicine

## 2020-03-30 DIAGNOSIS — N3281 Overactive bladder: Secondary | ICD-10-CM

## 2020-04-07 ENCOUNTER — Encounter: Payer: Self-pay | Admitting: Internal Medicine

## 2020-04-07 ENCOUNTER — Other Ambulatory Visit: Payer: Self-pay

## 2020-04-07 ENCOUNTER — Ambulatory Visit (INDEPENDENT_AMBULATORY_CARE_PROVIDER_SITE_OTHER): Payer: Medicare Other | Admitting: Internal Medicine

## 2020-04-07 VITALS — BP 130/80 | HR 74 | Temp 98.6°F | Wt 185.6 lb

## 2020-04-07 DIAGNOSIS — N3000 Acute cystitis without hematuria: Secondary | ICD-10-CM | POA: Diagnosis not present

## 2020-04-07 DIAGNOSIS — N390 Urinary tract infection, site not specified: Secondary | ICD-10-CM | POA: Diagnosis not present

## 2020-04-07 LAB — POCT URINALYSIS DIPSTICK
Bilirubin, UA: NEGATIVE
Blood, UA: NEGATIVE
Glucose, UA: NEGATIVE
Ketones, UA: NEGATIVE
Nitrite, UA: NEGATIVE
Protein, UA: NEGATIVE
Spec Grav, UA: 1.02 (ref 1.010–1.025)
Urobilinogen, UA: 0.2 E.U./dL
pH, UA: 7 (ref 5.0–8.0)

## 2020-04-07 MED ORDER — CIPROFLOXACIN HCL 250 MG PO TABS: 250.0000 mg | ORAL_TABLET | Freq: Two times a day (BID) | ORAL | 0 refills | Status: AC

## 2020-04-07 NOTE — Progress Notes (Signed)
Acute office Visit     This visit occurred during the SARS-CoV-2 public health emergency.  Safety protocols were in place, including screening questions prior to the visit, additional usage of staff PPE, and extensive cleaning of exam room while observing appropriate contact time as indicated for disinfecting solutions.    CC/Reason for Visit: dysuria  HPI: [name] is a 79 y.o. female who is coming in today for the above mentioned reasons. As she started recovering from a bout a diarrhea, presumably due to an acute viral gastroenteritis, she started having suprapubic pain and dysuria. No fever, no blood.    Past Medical/Surgical History: Past Medical History:  Diagnosis Date  . Allergy   . Anxiety   . Arthritis    PAIN AND OA RIGHT KNEE; PAIN AND DDD AND SPINAL STENOSIS  . Complication of anesthesia    PULLED OUT MY IV'S AND FOLEY - HALLUCINATIONS WITH HIATAL HERNIA SURGERY  . Depression   . Diverticulosis    PER COLONOSCOPY  . GERD (gastroesophageal reflux disease)    OVER THE COUNTER MEDS IF NEEDED  . Heart murmur    "VERY SLIGHT ALL MY LIFE"  . Hyperlipidemia   . Hypertension   . Sleep apnea    does not tolerate CPAP - HAS NOT USED IN OVER 2 YRS    Past Surgical History:  Procedure Laterality Date  . COLONOSCOPY    . HIATAL HERNIA REPAIR  2008  . LUMBAR FUSION    . LUMBAR LAMINECTOMY    . PARTIAL KNEE ARTHROPLASTY Right 07/02/2014   Procedure: RIGHT MEDIAL UNICOMPARTMENTAL KNEE;  Surgeon: Mauri Pole, MD;  Location: [facility];  Service: Orthopedics;  Laterality: Right;  . UPPER GASTROINTESTINAL ENDOSCOPY      Social History:  reports that she has never smoked. She has never used smokeless tobacco. She reports that she does not drink alcohol and does not use drugs.  Allergies: Allergies  Allergen Reactions  . Ambien [Zolpidem Tartrate]     amnesia  . Hydrochlorothiazide     Other reaction(s): Lethargy (intolerance)  . Prednisone     Rapid  heart rate  . Thiazide-Type Diuretics Other (See Comments)    "could not physically move"  . Sulfamethoxazole Rash    Family History:  Family History  Problem Relation Age of Onset  . Heart disease Mother   . Heart disease Father   . Heart disease Sister   . Pulmonary embolism Sister   . Diabetes Paternal Grandfather      Current Outpatient Medications:  .  acetaminophen (TYLENOL 8 HOUR) 650 MG CR tablet, Take 650 mg by mouth every 8 (eight) hours as needed for pain., Disp: , Rfl:  .  ALPRAZolam (XANAX) 0.25 MG tablet, Take 1 tablet (0.25 mg total) by mouth 2 (two) times daily as needed for anxiety., Disp: 60 tablet, Rfl: 0 .  amLODipine (NORVASC) 5 MG tablet, TAKE (1) TABLET DAILY FOR HIGH BLOOD PRESSURE., Disp: 90 tablet, Rfl: 1 .  calcium & magnesium carbonates (MYLANTA) 505-397 MG tablet, Take 1 tablet by mouth daily., Disp: , Rfl:  .  famotidine (PEPCID) 20 MG tablet, Take 1 tablet (20 mg total) by mouth daily., Disp: 90 tablet, Rfl: 1 .  HYDROcodone-acetaminophen (NORCO/VICODIN) 5-325 MG tablet, Take 1 tablet by mouth 3 (three) times daily as needed for moderate pain., Disp: 90 tablet, Rfl: 0 .  HYDROcodone-acetaminophen (NORCO/VICODIN) 5-325 MG tablet, Take 1 tablet by mouth 3 (three) times daily as needed  for moderate pain., Disp: 90 tablet, Rfl: 0 .  HYDROcodone-acetaminophen (NORCO/VICODIN) 5-325 MG tablet, Take 1 tablet by mouth 3 (three) times daily as needed for moderate pain., Disp: 90 tablet, Rfl: 0 .  lisinopril (ZESTRIL) 5 MG tablet, Take 1 tablet (5 mg total) by mouth daily., Disp: 90 tablet, Rfl: 1 .  mometasone (NASONEX) 50 MCG/ACT nasal spray, Place 2 sprays into the nose at bedtime., Disp: 17 g, Rfl: 6 .  oxybutynin (DITROPAN-XL) 10 MG 24 hr tablet, TAKE 1 TABLET BY MOUTH DAILY., Disp: 30 tablet, Rfl: 3 .  polyvinyl alcohol (LIQUIFILM TEARS) 1.4 % ophthalmic solution, Place 1-2 drops into both eyes daily as needed for dry eyes., Disp: , Rfl:  .  simvastatin (ZOCOR)  20 MG tablet, Take 1 tablet (20 mg total) by mouth at bedtime., Disp: 90 tablet, Rfl: 1 .  triamcinolone cream (KENALOG) 0.1 %, APPLY TO RASH SPARINGLY ONCE A DAY., Disp: 30 g, Rfl: 0 .  ciprofloxacin (CIPRO) 250 MG tablet, Take 1 tablet (250 mg total) by mouth 2 (two) times daily for 7 days., Disp: 14 tablet, Rfl: 0  Review of Systems:  Constitutional: Denies fever, chills, diaphoresis, appetite change and fatigue.  HEENT: Denies photophobia, eye pain, redness, hearing loss, ear pain, congestion, sore throat, rhinorrhea, sneezing, mouth sores, trouble swallowing, neck pain, neck stiffness and tinnitus.   Respiratory: Denies SOB, DOE, cough, chest tightness,  and wheezing.   Cardiovascular: Denies chest pain, palpitations and leg swelling.  Gastrointestinal: Denies nausea, vomiting, abdominal pain, diarrhea, constipation, blood in stool and abdominal distention.  Genitourinary: Denies hematuria, flank pain and difficulty urinating.  Endocrine: Denies: hot or cold intolerance, sweats, changes in hair or nails, polyuria, polydipsia. Musculoskeletal: Denies myalgias, back pain, joint swelling, arthralgias and gait problem.  Skin: Denies pallor, rash and wound.  Neurological: Denies dizziness, seizures, syncope, weakness, light-headedness, numbness and headaches.  Hematological: Denies adenopathy. Easy bruising, personal or family bleeding history  Psychiatric/Behavioral: Denies suicidal ideation, mood changes, confusion, nervousness, sleep disturbance and agitation    Physical Exam: Vitals:   04/07/20 1311  BP: 130/80  Pulse: 74  Temp: 98.6 F (37 C)  TempSrc: Oral  SpO2: 96%  Weight: 185 lb 9.6 oz (84.2 kg)    Body mass index is 32.88 kg/m.   Constitutional: NAD, calm, comfortable Eyes: PERRL, lids and conjunctivae normal ENMT: Mucous membranes are moist.  Abdomen: no tenderness, no masses palpated. No hepatosplenomegaly. Bowel sounds positive.  Psychiatric: Normal judgment and  insight. Alert and oriented x 3. Normal mood.    Impression and Plan:  Acute cystitis without hematuria -Urine dipstick in office with leukocytes. -Send for UA and urine cx. -Will Rx cipro 250 mg BID for 7 days given her sulfa allergy.    Patient Instructions  -Nice seeing you today!!  -Start cipro 250 mg twice daily for 7 days.  -come back to see me if not better after 10-14 days.   Urinary Tract Infection, Adult A urinary tract infection (UTI) is an infection of any part of the urinary tract. The urinary tract includes:  The kidneys.  The ureters.  The bladder.  The urethra. These organs make, store, and get rid of pee (urine) in the body. What are the causes? This is caused by germs (bacteria) in your genital area. These germs grow and cause swelling (inflammation) of your urinary tract. What increases the risk? You are more likely to develop this condition if:  You have a small, thin tube (catheter) to drain pee.  You cannot control when you pee or poop (incontinence).  You are female, and: ? You use these methods to prevent pregnancy:  A medicine that kills sperm (spermicide).  A device that blocks sperm (diaphragm). ? You have low levels of a female hormone (estrogen). ? You are pregnant.  You have genes that add to your risk.  You are sexually active.  You take antibiotic medicines.  You have trouble peeing because of: ? A prostate that is bigger than normal, if you are female. ? A blockage in the part of your body that drains pee from the bladder (urethra). ? A kidney stone. ? A nerve condition that affects your bladder (neurogenic bladder). ? Not getting enough to drink. ? Not peeing often enough.  You have other conditions, such as: ? Diabetes. ? A weak disease-fighting system (immune system). ? Sickle cell disease. ? Gout. ? Injury of the spine. What are the signs or symptoms? Symptoms of this condition include:  Needing to pee right  away (urgently).  Peeing often.  Peeing small amounts often.  Pain or burning when peeing.  Blood in the pee.  Pee that smells bad or not like normal.  Trouble peeing.  Pee that is cloudy.  Fluid coming from the vagina, if you are female.  Pain in the belly or lower back. Other symptoms include:  Throwing up (vomiting).  No urge to eat.  Feeling mixed up (confused).  Being tired and grouchy (irritable).  A fever.  Watery poop (diarrhea). How is this treated? This condition may be treated with:  Antibiotic medicine.  Other medicines.  Drinking enough water. Follow these instructions at home:  Medicines  Take over-the-counter and prescription medicines only as told by your doctor.  If you were prescribed an antibiotic medicine, take it as told by your doctor. Do not stop taking it even if you start to feel better. General instructions  Make sure you: ? Pee until your bladder is empty. ? Do not hold pee for a long time. ? Empty your bladder after sex. ? Wipe from front to back after pooping if you are a female. Use each tissue one time when you wipe.  Drink enough fluid to keep your pee pale yellow.  Keep all follow-up visits as told by your doctor. This is important. Contact a doctor if:  You do not get better after 1-2 days.  Your symptoms go away and then come back. Get help right away if:  You have very bad back pain.  You have very bad pain in your lower belly.  You have a fever.  You are sick to your stomach (nauseous).  You are throwing up. Summary  A urinary tract infection (UTI) is an infection of any part of the urinary tract.  This condition is caused by germs in your genital area.  There are many risk factors for a UTI. These include having a small, thin tube to drain pee and not being able to control when you pee or poop.  Treatment includes antibiotic medicines for germs.  Drink enough fluid to keep your pee pale  yellow. This information is not intended to replace advice given to you by your health care provider. Make sure you discuss any questions you have with your health care provider. Document Revised: 08/07/2018 Document Reviewed: 02/27/2018 Elsevier Patient Education  2020 [place], [place] [facility]

## 2020-04-07 NOTE — Patient Instructions (Signed)
-Nice seeing you today!!  -Start cipro 250 mg twice daily for 7 days.  -come back to see me if not better after 10-14 days.   Urinary Tract Infection, Adult A urinary tract infection (UTI) is an infection of any part of the urinary tract. The urinary tract includes:  The kidneys.  The ureters.  The bladder.  The urethra. These organs make, store, and get rid of pee (urine) in the body. What are the causes? This is caused by germs (bacteria) in your genital area. These germs grow and cause swelling (inflammation) of your urinary tract. What increases the risk? You are more likely to develop this condition if:  You have a small, thin tube (catheter) to drain pee.  You cannot control when you pee or poop (incontinence).  You are female, and: ? You use these methods to prevent pregnancy:  A medicine that kills sperm (spermicide).  A device that blocks sperm (diaphragm). ? You have low levels of a female hormone (estrogen). ? You are pregnant.  You have genes that add to your risk.  You are sexually active.  You take antibiotic medicines.  You have trouble peeing because of: ? A prostate that is bigger than normal, if you are female. ? A blockage in the part of your body that drains pee from the bladder (urethra). ? A kidney stone. ? A nerve condition that affects your bladder (neurogenic bladder). ? Not getting enough to drink. ? Not peeing often enough.  You have other conditions, such as: ? Diabetes. ? A weak disease-fighting system (immune system). ? Sickle cell disease. ? Gout. ? Injury of the spine. What are the signs or symptoms? Symptoms of this condition include:  Needing to pee right away (urgently).  Peeing often.  Peeing small amounts often.  Pain or burning when peeing.  Blood in the pee.  Pee that smells bad or not like normal.  Trouble peeing.  Pee that is cloudy.  Fluid coming from the vagina, if you are female.  Pain in the belly  or lower back. Other symptoms include:  Throwing up (vomiting).  No urge to eat.  Feeling mixed up (confused).  Being tired and grouchy (irritable).  A fever.  Watery poop (diarrhea). How is this treated? This condition may be treated with:  Antibiotic medicine.  Other medicines.  Drinking enough water. Follow these instructions at home:  Medicines  Take over-the-counter and prescription medicines only as told by your doctor.  If you were prescribed an antibiotic medicine, take it as told by your doctor. Do not stop taking it even if you start to feel better. General instructions  Make sure you: ? Pee until your bladder is empty. ? Do not hold pee for a long time. ? Empty your bladder after sex. ? Wipe from front to back after pooping if you are a female. Use each tissue one time when you wipe.  Drink enough fluid to keep your pee pale yellow.  Keep all follow-up visits as told by your doctor. This is important. Contact a doctor if:  You do not get better after 1-2 days.  Your symptoms go away and then come back. Get help right away if:  You have very bad back pain.  You have very bad pain in your lower belly.  You have a fever.  You are sick to your stomach (nauseous).  You are throwing up. Summary  A urinary tract infection (UTI) is an infection of any part of the  urinary tract.  This condition is caused by germs in your genital area.  There are many risk factors for a UTI. These include having a small, thin tube to drain pee and not being able to control when you pee or poop.  Treatment includes antibiotic medicines for germs.  Drink enough fluid to keep your pee pale yellow. This information is not intended to replace advice given to you by your health care provider. Make sure you discuss any questions you have with your health care provider. Document Revised: 08/07/2018 Document Reviewed: 02/27/2018 Elsevier Patient Education  2020 [employer].

## 2020-04-09 LAB — URINE CULTURE
MICRO NUMBER:: 10792099
SPECIMEN QUALITY:: ADEQUATE

## 2020-04-09 LAB — URINALYSIS
Bilirubin Urine: NEGATIVE
Glucose, UA: NEGATIVE
Hgb urine dipstick: NEGATIVE
Ketones, ur: NEGATIVE
Nitrite: NEGATIVE
Protein, ur: NEGATIVE
Specific Gravity, Urine: 1.02 (ref 1.001–1.03)
pH: 7 (ref 5.0–8.0)

## 2020-04-11 ENCOUNTER — Other Ambulatory Visit: Payer: Self-pay | Admitting: Internal Medicine

## 2020-04-15 ENCOUNTER — Other Ambulatory Visit: Payer: Self-pay | Admitting: Internal Medicine

## 2020-04-15 DIAGNOSIS — F411 Generalized anxiety disorder: Secondary | ICD-10-CM

## 2020-05-02 ENCOUNTER — Other Ambulatory Visit: Payer: Self-pay | Admitting: Internal Medicine

## 2020-05-02 ENCOUNTER — Other Ambulatory Visit: Payer: Self-pay

## 2020-05-02 ENCOUNTER — Encounter: Payer: Medicare Other | Attending: Internal Medicine | Admitting: Registered"

## 2020-05-02 ENCOUNTER — Encounter: Payer: Self-pay | Admitting: Registered"

## 2020-05-02 DIAGNOSIS — E119 Type 2 diabetes mellitus without complications: Secondary | ICD-10-CM | POA: Diagnosis not present

## 2020-05-02 DIAGNOSIS — I10 Essential (primary) hypertension: Secondary | ICD-10-CM

## 2020-05-02 NOTE — Patient Instructions (Addendum)
Increasing fruit is a good change you have made, consider including protein such as nuts with it. Continue drinking plenty of water Aim to eat balanced meals and snacks Around 30 grams of carbs in meals is a good place to start, you do not need to go back to a keto diet. Consider having few carb choices with your [employer] breakfast Having occasional serving of ice cream, about 1/2 cup, is fine. When you do have it, consider having it close to a meal time and having fewer cabs in that meal.  Physical Activity: Consider working with your physical therapist for exercise ideas that can safely raise your heart rate as well as some resistance exercise if it is appropriate for you.  Consider asking your doctor about new alternatives to treating sleep apnea. Getting more oxygen while sleeping may greatly improve your energy level.  Contact your insurance company to see if they cover the supplies for the meter I gave you today. If not get the name of the meter and contact your doctor's office for a prescription for the meter and supplies.

## 2020-05-02 NOTE — Progress Notes (Signed)
Diabetes Self-Management Education  Visit Type: First/Initial  Appt. Start Time: 1100 Appt. End Time: 1215  05/02/2020  [name], identified by name and date of birth, is a 79 y.o. female with a diagnosis of Diabetes: Type 2.   This patient is accompanied in the office by her spouse.  ASSESSMENT  There were no vitals taken for this visit. There is no height or weight on file to calculate BMI.  Patient states her doctor has told her that she has had prediabetes for awhile. Patient's husband also has diabetes and states he doesn't think she has diabetes because he checks her blood sugar and it never goes over 140.   Pt states last year she followed the keto diet and lost a lot of weight but hit a plateau.  Patient only drinks water and has cut back sweet foods. Pt states she would like to have ice cream sometimes. Pt states she was not eating a lot of candy before.  Patient states she does not get much exercise because she is too tired and has a lot of pain in knees and back which she has had multiple surgeries on.  Patient states she is interested in having her own meter to check her blood sugar. Provided Contour Next meter Lot#: [id number] Exp: 05/03/21 CBG: 138 mg/dL (~2 hrs after [place] egg sandwich)  Pt states she will contact office if interested in follow-up visit.   Diabetes Self-Management Education - 05/02/20 1116      Visit Information   Visit Type First/Initial      Initial Visit   Diabetes Type Type 2    Are you currently following a meal plan? Yes    What type of meal plan do you follow? lower carb, not many sweets    Are you taking your medications as prescribed? Not on Medications    Date Diagnosed July 2021      Health Coping   How would you rate your overall health? Fair      Psychosocial Assessment   Patient Belief/Attitude about Diabetes --   confused   How often do you need to have someone help you when you read instructions,  pamphlets, or other written materials from your doctor or pharmacy? 3 - Sometimes    What is the last grade level you completed in school? some college      Complications   Last HgB A1C per patient/outside source 6.5 %    How often do you check your blood sugar? 0 times/day (not testing)    Have you had a dilated eye exam in the past 12 months? Yes    Have you had a dental exam in the past 12 months? Yes    Are you checking your feet? Yes    How many days per week are you checking your feet? 7      Dietary Intake   Breakfast 2 pancakes, 2 eggs, hashbrown casserole, bacon, coffee    Snack (morning) sometimes cheese crackers OR fruit    Lunch chick file-a chicken tenders, salad    Dinner if smaller lunch, lean cuisine    Snack (evening) yogurt & two cheese crackers with PB    Beverage(s) water, 1 c black coffee      Exercise   Exercise Type ADL's    How many days per week to you exercise? 0    How many minutes per day do you exercise? 0    Total minutes per week of exercise 0  Patient Education   Previous Diabetes Education No    Nutrition management  Role of diet in the treatment of diabetes and the relationship between the three main macronutrients and blood glucose level    Physical activity and exercise  Role of exercise on diabetes management, blood pressure control and cardiac health.    Monitoring Taught/evaluated SMBG meter.;Identified appropriate SMBG and/or A1C goals.    Acute complications Taught treatment of hypoglycemia - the 15 rule.    Psychosocial adjustment Role of stress on diabetes      Individualized Goals (developed by patient)   Nutrition General guidelines for healthy choices and portions discussed    Monitoring  test my blood glucose as discussed      Outcomes   Expected Outcomes Demonstrated interest in learning. Expect positive outcomes    Future DMSE PRN    Program Status Completed           Individualized Plan for Diabetes Self-Management  Training:   Learning Objective:  Patient will have a greater understanding of diabetes self-management. Patient education plan is to attend individual and/or group sessions per assessed needs and concerns.   Patient Instructions  Increasing fruit is a good change you have made, consider including protein such as nuts with it. Continue drinking plenty of water Aim to eat balanced meals and snacks Around 30 grams of carbs in meals is a good place to start, you do not need to go back to a keto diet. Consider having few carb choices with your [employer] breakfast Having occasional serving of ice cream, about 1/2 cup, is fine. When you do have it, consider having it close to a meal time and having fewer cabs in that meal.  Physical Activity: Consider working with your physical therapist for exercise ideas that can safely raise your heart rate as well as some resistance exercise if it is appropriate for you.  Consider asking your doctor about new alternatives to treating sleep apnea. Getting more oxygen while sleeping may greatly improve your energy level.  Contact your insurance company to see if they cover the supplies for the meter I gave you today. If not get the name of the meter and contact your doctor's office for a prescription for the meter and supplies.    Expected Outcomes:  Demonstrated interest in learning. Expect positive outcomes  Education material provided: ADA - How to Thrive: A Guide for Your Journey with Diabetes and A1C conversion sheet  If problems or questions, patient to contact team via:  Phone and MyChart  Future DSME appointment: PRN

## 2020-05-03 ENCOUNTER — Telehealth: Payer: Self-pay | Admitting: Internal Medicine

## 2020-05-03 DIAGNOSIS — I10 Essential (primary) hypertension: Secondary | ICD-10-CM

## 2020-05-03 NOTE — Telephone Encounter (Signed)
Left message on machine for patient to return our call. Is she taking 1 or 2 tabs of lisinopril?

## 2020-05-03 NOTE — Telephone Encounter (Signed)
Please verify that the pt is to take 2 a day of  lisinopril (ZESTRIL) 5 MG tablet    If so, pharmacy needs a new prescription sent.   Please advise

## 2020-05-04 ENCOUNTER — Encounter: Payer: Self-pay | Admitting: Internal Medicine

## 2020-05-05 NOTE — Telephone Encounter (Signed)
[facility] stated the pt informed them that she has been taking 2 pills of Lisinopril a day but the last px they got it says one a day. They are needing clarity.  [facility] [facility] can be reached [phone]

## 2020-05-06 ENCOUNTER — Ambulatory Visit: Payer: Medicare Other | Admitting: Internal Medicine

## 2020-05-10 NOTE — Telephone Encounter (Signed)
Patient stated that she has always been taking 2 pills daily and somewhere it was changed to 1 daily.  She needs this sent in today because she has been out for a while.  Please advise

## 2020-05-11 MED ORDER — ACCU-CHEK GUIDE ME W/DEVICE KIT: 1.0000 | PACK | Freq: Every day | 2 refills | Status: DC

## 2020-05-11 MED ORDER — LISINOPRIL 5 MG PO TABS: 5.0000 mg | ORAL_TABLET | Freq: Two times a day (BID) | ORAL | 1 refills | Status: DC

## 2020-05-11 MED ORDER — ACCU-CHEK GUIDE VI STRP: 1.0000 | ORAL_STRIP | Freq: Every day | 12 refills | Status: DC

## 2020-05-11 MED ORDER — ACCU-CHEK FASTCLIX LANCETS MISC: 1.0000 | Freq: Every day | 12 refills | Status: DC

## 2020-05-11 NOTE — Addendum Note (Signed)
Addended by: Westley Hummer B on: 05/11/2020 08:06 AM   Modules accepted: Orders

## 2020-05-11 NOTE — Telephone Encounter (Signed)
Refill sent.

## 2020-05-16 ENCOUNTER — Other Ambulatory Visit: Payer: Self-pay

## 2020-05-16 ENCOUNTER — Ambulatory Visit (INDEPENDENT_AMBULATORY_CARE_PROVIDER_SITE_OTHER): Payer: Medicare Other

## 2020-05-16 DIAGNOSIS — Z23 Encounter for immunization: Secondary | ICD-10-CM | POA: Diagnosis not present

## 2020-06-08 ENCOUNTER — Ambulatory Visit (INDEPENDENT_AMBULATORY_CARE_PROVIDER_SITE_OTHER): Payer: Medicare Other | Admitting: Internal Medicine

## 2020-06-08 ENCOUNTER — Encounter: Payer: Self-pay | Admitting: Internal Medicine

## 2020-06-08 ENCOUNTER — Other Ambulatory Visit: Payer: Self-pay

## 2020-06-08 VITALS — BP 130/86 | HR 70 | Temp 98.0°F | Wt 180.0 lb

## 2020-06-08 DIAGNOSIS — I10 Essential (primary) hypertension: Secondary | ICD-10-CM

## 2020-06-08 DIAGNOSIS — E119 Type 2 diabetes mellitus without complications: Secondary | ICD-10-CM | POA: Diagnosis not present

## 2020-06-08 DIAGNOSIS — F411 Generalized anxiety disorder: Secondary | ICD-10-CM | POA: Diagnosis not present

## 2020-06-08 DIAGNOSIS — R7303 Prediabetes: Secondary | ICD-10-CM | POA: Diagnosis not present

## 2020-06-08 DIAGNOSIS — N3281 Overactive bladder: Secondary | ICD-10-CM

## 2020-06-08 DIAGNOSIS — K219 Gastro-esophageal reflux disease without esophagitis: Secondary | ICD-10-CM

## 2020-06-08 DIAGNOSIS — E785 Hyperlipidemia, unspecified: Secondary | ICD-10-CM | POA: Diagnosis not present

## 2020-06-08 DIAGNOSIS — E669 Obesity, unspecified: Secondary | ICD-10-CM

## 2020-06-08 LAB — POCT GLYCOSYLATED HEMOGLOBIN (HGB A1C): Hemoglobin A1C: 6.1 % — AB (ref 4.0–5.6)

## 2020-06-08 MED ORDER — ALPRAZOLAM 0.25 MG PO TABS: ORAL_TABLET | ORAL | 0 refills | Status: DC

## 2020-06-08 NOTE — Progress Notes (Signed)
Established Patient Office Visit     This visit occurred during the SARS-CoV-2 public health emergency.  Safety protocols were in place, including screening questions prior to the visit, additional usage of staff PPE, and extensive cleaning of exam room while observing appropriate contact time as indicated for disinfecting solutions.    CC/Reason for Visit: Follow-up chronic medical conditions  HPI: [name] is a 79 y.o. female who is coming in today for the above mentioned reasons. Past Medical History is significant for:   1. Hypertension for which she takes amlodipine and lisinopril, she does not have a blood pressure cuff and has not checked blood pressure recently.  2. Obstructive sleep apnea but she is intolerant of CPAP.  3. GERD for which she takes over-the-counter PPI daily.  4. Overactive bladder for which she takes daily oxybutynin  5.Hyperlipidemia for which she no longer takes medications.  6. Depression/anxiety. She has been on alprazolam 0.25 mg twice daily for at least 8 to 10 years.  7. 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 Alvan Dame 3 years ago. She has been maintained on hydrocodone 5/325 mg 1 tablet 3 times a day.  8.  She was newly diagnosed with diabetes with an A1c of 6.5 in July.  She has elected to try lifestyle modifications before medication.  She has been doing well.  She got her Covid booster as well as her flu vaccine earlier this month.  Through healthy eating and increased physical activity she has lost close to 6 pounds since her last visit.  Past Medical/Surgical History: Past Medical History:  Diagnosis Date  . Allergy   . Anxiety   . Arthritis    PAIN AND OA RIGHT KNEE; PAIN AND DDD AND SPINAL STENOSIS  . Complication of anesthesia    PULLED OUT MY IV'S AND FOLEY - HALLUCINATIONS WITH HIATAL HERNIA SURGERY  . Depression   . Diverticulosis    PER COLONOSCOPY  . GERD  (gastroesophageal reflux disease)    OVER THE COUNTER MEDS IF NEEDED  . Heart murmur    "VERY SLIGHT ALL MY LIFE"  . Hyperlipidemia   . Hypertension   . Sleep apnea    does not tolerate CPAP - HAS NOT USED IN OVER 2 YRS    Past Surgical History:  Procedure Laterality Date  . COLONOSCOPY    . HIATAL HERNIA REPAIR  2008  . LUMBAR FUSION    . LUMBAR LAMINECTOMY    . PARTIAL KNEE ARTHROPLASTY Right 07/02/2014   Procedure: RIGHT MEDIAL UNICOMPARTMENTAL KNEE;  Surgeon: Mauri Pole, MD;  Location: [facility];  Service: Orthopedics;  Laterality: Right;  . UPPER GASTROINTESTINAL ENDOSCOPY      Social History:  reports that she has never smoked. She has never used smokeless tobacco. She reports that she does not drink alcohol and does not use drugs.  Allergies: Allergies  Allergen Reactions  . Ambien [Zolpidem Tartrate]     amnesia  . Hydrochlorothiazide     Other reaction(s): Lethargy (intolerance)  . Prednisone     Rapid heart rate  . Thiazide-Type Diuretics Other (See Comments)    "could not physically move"  . Sulfamethoxazole Rash    Family History:  Family History  Problem Relation Age of Onset  . Heart disease Mother   . Heart disease Father   . Heart disease Sister   . Pulmonary embolism Sister   . Diabetes Paternal Grandfather  Current Outpatient Medications:  .  Accu-Chek FastClix Lancets MISC, 1 each by Does not apply route daily., Disp: 100 each, Rfl: 12 .  acetaminophen (TYLENOL 8 HOUR) 650 MG CR tablet, Take 650 mg by mouth every 8 (eight) hours as needed for pain., Disp: , Rfl:  .  ALPRAZolam (XANAX) 0.25 MG tablet, TAKE (1) TABLET TWICE DAILY AS NEEDED FOR ANXIETY., Disp: 60 tablet, Rfl: 0 .  amLODipine (NORVASC) 5 MG tablet, TAKE (1) TABLET DAILY FOR HIGH BLOOD PRESSURE., Disp: 90 tablet, Rfl: 1 .  Blood Glucose Monitoring Suppl (ACCU-CHEK GUIDE ME) w/Device KIT, 1 each by Does not apply route daily., Disp: 1 kit, Rfl: 2 .  calcium & magnesium  carbonates (MYLANTA) 311-232 MG tablet, Take 1 tablet by mouth daily., Disp: , Rfl:  .  famotidine (PEPCID) 20 MG tablet, Take 1 tablet (20 mg total) by mouth daily., Disp: 90 tablet, Rfl: 1 .  glucose blood (ACCU-CHEK GUIDE) test strip, 1 each by Other route daily. Use as instructed, Disp: 100 each, Rfl: 12 .  HYDROcodone-acetaminophen (NORCO/VICODIN) 5-325 MG tablet, Take 1 tablet by mouth 3 (three) times daily as needed for moderate pain., Disp: 90 tablet, Rfl: 0 .  HYDROcodone-acetaminophen (NORCO/VICODIN) 5-325 MG tablet, Take 1 tablet by mouth 3 (three) times daily as needed for moderate pain., Disp: 90 tablet, Rfl: 0 .  HYDROcodone-acetaminophen (NORCO/VICODIN) 5-325 MG tablet, Take 1 tablet by mouth 3 (three) times daily as needed for moderate pain., Disp: 90 tablet, Rfl: 0 .  lisinopril (ZESTRIL) 5 MG tablet, Take 1 tablet (5 mg total) by mouth in the morning and at bedtime., Disp: 180 tablet, Rfl: 1 .  mometasone (NASONEX) 50 MCG/ACT nasal spray, Place 2 sprays into the nose at bedtime., Disp: 17 g, Rfl: 6 .  oxybutynin (DITROPAN-XL) 10 MG 24 hr tablet, TAKE 1 TABLET BY MOUTH DAILY., Disp: 30 tablet, Rfl: 3 .  polyvinyl alcohol (LIQUIFILM TEARS) 1.4 % ophthalmic solution, Place 1-2 drops into both eyes daily as needed for dry eyes., Disp: , Rfl:  .  simvastatin (ZOCOR) 20 MG tablet, Take 1 tablet (20 mg total) by mouth at bedtime., Disp: 90 tablet, Rfl: 1 .  triamcinolone cream (KENALOG) 0.1 %, APPLY TO RASH SPARINGLY ONCE A DAY., Disp: 30 g, Rfl: 0  Review of Systems:  Constitutional: Denies fever, chills, diaphoresis, appetite change and fatigue.  HEENT: Denies photophobia, eye pain, redness, hearing loss, ear pain, congestion, sore throat, rhinorrhea, sneezing, mouth sores, trouble swallowing, neck pain, neck stiffness and tinnitus.   Respiratory: Denies SOB, DOE, cough, chest tightness,  and wheezing.   Cardiovascular: Denies chest pain, palpitations and leg swelling.    Gastrointestinal: Denies nausea, vomiting, abdominal pain, diarrhea, constipation, blood in stool and abdominal distention.  Genitourinary: Denies dysuria, urgency, frequency, hematuria, flank pain and difficulty urinating.  Endocrine: Denies: hot or cold intolerance, sweats, changes in hair or nails, polyuria, polydipsia. Musculoskeletal: Denies myalgias, back pain, joint swelling, arthralgias and gait problem.  Skin: Denies pallor, rash and wound.  Neurological: Denies dizziness, seizures, syncope, weakness, light-headedness, numbness and headaches.  Hematological: Denies adenopathy. Easy bruising, personal or family bleeding history  Psychiatric/Behavioral: Denies suicidal ideation, mood changes, confusion, nervousness, sleep disturbance and agitation    Physical Exam: Vitals:   06/08/20 1056  BP: 130/86  Pulse: 70  Temp: 98 F (36.7 C)  TempSrc: Oral  SpO2: 95%  Weight: 180 lb (81.6 kg)    Body mass index is 31.89 kg/m.   Constitutional: NAD, calm, comfortable Eyes:  PERRL, lids and conjunctivae normal, wears corrective lenses ENMT: Mucous membranes are moist. Respiratory: clear to auscultation bilaterally, no wheezing, no crackles. Normal respiratory effort. No accessory muscle use.  Cardiovascular: Regular rate and rhythm, no murmurs / rubs / gallops. No extremity edema.  Neurologic: Grossly intact and nonfocal Psychiatric: Normal judgment and insight. Alert and oriented x 3. Normal mood.    Impression and Plan:  Generalized anxiety disorder  - Plan: ALPRAZolam (XANAX) 0.25 MG tablet -That she uses twice daily for years.  Type 2 diabetes mellitus without complication, without long-term current use of insulin ([place]) -This is a new diagnosis as of July with an A1c of 6.5. -She elected to try lifestyle changes prior to medications.  To that effect she has lost, 6 pounds. -Her A1c is down to 6.1 today.  Hyperlipidemia, unspecified hyperlipidemia type -At goal with an  LDL of 75 and equal.  Essential hypertension -Currently well controlled on lisinopril 5 mg daily.  Obesity (BMI 30-39.9) -Discussed healthy lifestyle, including increased physical activity and better food choices to promote weight loss. -She has been congratulated on her weight loss thus far.  Gastroesophageal reflux disease, unspecified whether esophagitis present -Well-controlled on Pepcid 20 mg daily.  Overactive bladder -Controlled on oxybutynin.      Lelon Frohlich, MD [facility]

## 2020-07-05 DIAGNOSIS — H5203 Hypermetropia, bilateral: Secondary | ICD-10-CM | POA: Diagnosis not present

## 2020-07-05 DIAGNOSIS — H524 Presbyopia: Secondary | ICD-10-CM | POA: Diagnosis not present

## 2020-07-05 DIAGNOSIS — H52203 Unspecified astigmatism, bilateral: Secondary | ICD-10-CM | POA: Diagnosis not present

## 2020-07-05 DIAGNOSIS — Z961 Presence of intraocular lens: Secondary | ICD-10-CM | POA: Diagnosis not present

## 2020-07-10 ENCOUNTER — Other Ambulatory Visit: Payer: Self-pay | Admitting: Internal Medicine

## 2020-07-17 ENCOUNTER — Other Ambulatory Visit: Payer: Self-pay | Admitting: Internal Medicine

## 2020-07-17 DIAGNOSIS — F411 Generalized anxiety disorder: Secondary | ICD-10-CM

## 2020-07-29 ENCOUNTER — Other Ambulatory Visit: Payer: Self-pay | Admitting: Internal Medicine

## 2020-07-29 DIAGNOSIS — N3281 Overactive bladder: Secondary | ICD-10-CM

## 2020-08-16 ENCOUNTER — Other Ambulatory Visit: Payer: Self-pay | Admitting: Internal Medicine

## 2020-08-16 DIAGNOSIS — F411 Generalized anxiety disorder: Secondary | ICD-10-CM

## 2020-08-23 ENCOUNTER — Other Ambulatory Visit: Payer: Self-pay | Admitting: Internal Medicine

## 2020-08-24 ENCOUNTER — Other Ambulatory Visit: Payer: Self-pay | Admitting: Internal Medicine

## 2020-08-24 DIAGNOSIS — Z79899 Other long term (current) drug therapy: Secondary | ICD-10-CM

## 2020-08-24 DIAGNOSIS — G894 Chronic pain syndrome: Secondary | ICD-10-CM

## 2020-08-24 NOTE — Telephone Encounter (Signed)
Pt is calling in stating that she would like to see if someone would call in her Hydrocodone-acetaminophen (NORCO/VICODIN) 5-325 MG since she is out and in the absence of her provider.  She is aware that they are seeing their pts and in between their pts they are helping with other providers msgs that are not in the office this week.

## 2020-08-25 ENCOUNTER — Other Ambulatory Visit: Payer: Self-pay | Admitting: Internal Medicine

## 2020-08-25 DIAGNOSIS — Z79899 Other long term (current) drug therapy: Secondary | ICD-10-CM

## 2020-08-25 DIAGNOSIS — G894 Chronic pain syndrome: Secondary | ICD-10-CM

## 2020-08-30 ENCOUNTER — Other Ambulatory Visit: Payer: Self-pay | Admitting: Internal Medicine

## 2020-08-30 DIAGNOSIS — G894 Chronic pain syndrome: Secondary | ICD-10-CM

## 2020-08-30 DIAGNOSIS — Z79899 Other long term (current) drug therapy: Secondary | ICD-10-CM

## 2020-08-30 NOTE — Telephone Encounter (Signed)
office visit 06/08/20

## 2020-08-30 NOTE — Telephone Encounter (Signed)
Please deny.  Duplicate.

## 2020-08-31 ENCOUNTER — Other Ambulatory Visit: Payer: Self-pay | Admitting: Internal Medicine

## 2020-08-31 DIAGNOSIS — G894 Chronic pain syndrome: Secondary | ICD-10-CM

## 2020-08-31 DIAGNOSIS — Z79899 Other long term (current) drug therapy: Secondary | ICD-10-CM

## 2020-08-31 NOTE — Telephone Encounter (Signed)
office visit 06/08/20 °

## 2020-09-01 MED ORDER — HYDROCODONE-ACETAMINOPHEN 5-325 MG PO TABS: 1.0000 | ORAL_TABLET | Freq: Three times a day (TID) | ORAL | 0 refills | Status: DC | PRN

## 2020-09-26 ENCOUNTER — Other Ambulatory Visit: Payer: Self-pay | Admitting: Internal Medicine

## 2020-09-26 DIAGNOSIS — I10 Essential (primary) hypertension: Secondary | ICD-10-CM

## 2020-09-26 DIAGNOSIS — F411 Generalized anxiety disorder: Secondary | ICD-10-CM

## 2020-10-12 ENCOUNTER — Encounter: Payer: Self-pay | Admitting: Internal Medicine

## 2020-10-12 ENCOUNTER — Other Ambulatory Visit: Payer: Self-pay

## 2020-10-12 ENCOUNTER — Ambulatory Visit (INDEPENDENT_AMBULATORY_CARE_PROVIDER_SITE_OTHER): Payer: Medicare Other | Admitting: Internal Medicine

## 2020-10-12 VITALS — BP 130/84 | Temp 98.4°F | Wt 178.4 lb

## 2020-10-12 DIAGNOSIS — F411 Generalized anxiety disorder: Secondary | ICD-10-CM

## 2020-10-12 DIAGNOSIS — G894 Chronic pain syndrome: Secondary | ICD-10-CM

## 2020-10-12 DIAGNOSIS — Z23 Encounter for immunization: Secondary | ICD-10-CM | POA: Diagnosis not present

## 2020-10-12 DIAGNOSIS — I10 Essential (primary) hypertension: Secondary | ICD-10-CM | POA: Diagnosis not present

## 2020-10-12 DIAGNOSIS — Z79899 Other long term (current) drug therapy: Secondary | ICD-10-CM | POA: Diagnosis not present

## 2020-10-12 DIAGNOSIS — E785 Hyperlipidemia, unspecified: Secondary | ICD-10-CM | POA: Diagnosis not present

## 2020-10-12 DIAGNOSIS — E119 Type 2 diabetes mellitus without complications: Secondary | ICD-10-CM | POA: Diagnosis not present

## 2020-10-12 DIAGNOSIS — N3000 Acute cystitis without hematuria: Secondary | ICD-10-CM

## 2020-10-12 DIAGNOSIS — E669 Obesity, unspecified: Secondary | ICD-10-CM

## 2020-10-12 LAB — POCT URINALYSIS DIPSTICK
Bilirubin, UA: NEGATIVE
Blood, UA: NEGATIVE
Glucose, UA: NEGATIVE
Ketones, UA: POSITIVE
Nitrite, UA: NEGATIVE
Protein, UA: POSITIVE — AB
Spec Grav, UA: 1.015 (ref 1.010–1.025)
Urobilinogen, UA: 0.2 E.U./dL
pH, UA: 6.5 (ref 5.0–8.0)

## 2020-10-12 LAB — URINALYSIS, ROUTINE W REFLEX MICROSCOPIC
Bilirubin Urine: NEGATIVE
Hgb urine dipstick: NEGATIVE
Nitrite: NEGATIVE
Specific Gravity, Urine: 1.02 (ref 1.000–1.030)
Total Protein, Urine: NEGATIVE
Urine Glucose: NEGATIVE
Urobilinogen, UA: 0.2 (ref 0.0–1.0)
pH: 7 (ref 5.0–8.0)

## 2020-10-12 LAB — POCT GLYCOSYLATED HEMOGLOBIN (HGB A1C): Hemoglobin A1C: 6 % — AB (ref 4.0–5.6)

## 2020-10-12 MED ORDER — CIPROFLOXACIN HCL 500 MG PO TABS: 500.0000 mg | ORAL_TABLET | Freq: Two times a day (BID) | ORAL | 0 refills | Status: AC

## 2020-10-12 MED ORDER — HYDROCODONE-ACETAMINOPHEN 5-325 MG PO TABS: 1.0000 | ORAL_TABLET | Freq: Three times a day (TID) | ORAL | 0 refills | Status: DC | PRN

## 2020-10-12 MED ORDER — ALPRAZOLAM 0.25 MG PO TABS: 0.2500 mg | ORAL_TABLET | Freq: Two times a day (BID) | ORAL | 0 refills | Status: DC | PRN

## 2020-10-12 MED ORDER — HYDROCODONE-ACETAMINOPHEN 5-325 MG PO TABS: ORAL_TABLET | ORAL | 0 refills | Status: DC

## 2020-10-12 NOTE — Addendum Note (Signed)
Addended by: Marrion Coy on: 10/12/2020 01:41 PM   Modules accepted: Orders

## 2020-10-12 NOTE — Patient Instructions (Signed)
-Nice seeing you today!!  -Start cipro 500 mg twice daily for 5 days for your urine infection.  -Schedule follow up in 3 months.   Urinary Tract Infection, Adult A urinary tract infection (UTI) is an infection of any part of the urinary tract. The urinary tract includes:  The kidneys.  The ureters.  The bladder.  The urethra. These organs make, store, and get rid of pee (urine) in the body. What are the causes? This infection is caused by germs (bacteria) in your genital area. These germs grow and cause swelling (inflammation) of your urinary tract. What increases the risk? The following factors may make you more likely to develop this condition:  Using a small, thin tube (catheter) to drain pee.  Not being able to control when you pee or poop (incontinence).  Being female. If you are female, these things can increase the risk: ? Using these methods to prevent pregnancy:  A medicine that kills sperm (spermicide).  A device that blocks sperm (diaphragm). ? Having low levels of a female hormone (estrogen). ? Being pregnant. You are more likely to develop this condition if:  You have genes that add to your risk.  You are sexually active.  You take antibiotic medicines.  You have trouble peeing because of: ? A prostate that is bigger than normal, if you are female. ? A blockage in the part of your body that drains pee from the bladder. ? A kidney stone. ? A nerve condition that affects your bladder. ? Not getting enough to drink. ? Not peeing often enough.  You have other conditions, such as: ? Diabetes. ? A weak disease-fighting system (immune system). ? Sickle cell disease. ? Gout. ? Injury of the spine. What are the signs or symptoms? Symptoms of this condition include:  Needing to pee right away.  Peeing small amounts often.  Pain or burning when peeing.  Blood in the pee.  Pee that smells bad or not like normal.  Trouble peeing.  Pee that is  cloudy.  Fluid coming from the vagina, if you are female.  Pain in the belly or lower back. Other symptoms include:  Vomiting.  Not feeling hungry.  Feeling mixed up (confused). This may be the first symptom in older adults.  Being tired and grouchy (irritable).  A fever.  Watery poop (diarrhea). How is this treated?  Taking antibiotic medicine.  Taking other medicines.  Drinking enough water. In some cases, you may need to see a specialist. Follow these instructions at home: Medicines  Take over-the-counter and prescription medicines only as told by your doctor.  If you were prescribed an antibiotic medicine, take it as told by your doctor. Do not stop taking it even if you start to feel better. General instructions  Make sure you: ? Pee until your bladder is empty. ? Do not hold pee for a long time. ? Empty your bladder after sex. ? Wipe from front to back after peeing or pooping if you are a female. Use each tissue one time when you wipe.  Drink enough fluid to keep your pee pale yellow.  Keep all follow-up visits.   Contact a doctor if:  You do not get better after 1-2 days.  Your symptoms go away and then come back. Get help right away if:  You have very bad back pain.  You have very bad pain in your lower belly.  You have a fever.  You have chills.  You feeling like you will  vomit or you vomit. Summary  A urinary tract infection (UTI) is an infection of any part of the urinary tract.  This condition is caused by germs in your genital area.  There are many risk factors for a UTI.  Treatment includes antibiotic medicines.  Drink enough fluid to keep your pee pale yellow. This information is not intended to replace advice given to you by your health care provider. Make sure you discuss any questions you have with your health care provider. Document Revised: 04/01/2020 Document Reviewed: 04/01/2020 Elsevier Patient Education  [place].

## 2020-10-12 NOTE — Progress Notes (Signed)
Established Patient Office Visit     This visit occurred during the SARS-CoV-2 public health emergency.  Safety protocols were in place, including screening questions prior to the visit, additional usage of staff PPE, and extensive cleaning of exam room while observing appropriate contact time as indicated for disinfecting solutions.    CC/Reason for Visit: 68-monthfollow-up chronic conditions, discuss some acute concerns  HPI: Felicia TISONis a 80y.o. female who is coming in today for the above mentioned reasons. Past Medical History is significant for:   1. Hypertension for which she takes amlodipine and lisinopril, she does not have a blood pressure cuff and has not checked blood pressure recently.  2. Obstructive sleep apnea but she is intolerant of CPAP.  3. GERD for which she takes over-the-counter PPI daily.  4. Overactive bladder for which she takes daily oxybutynin  5.Hyperlipidemia for which she no longer takes medications.  6. Depression/anxiety. She has been on alprazolam 0.25 mg twice daily for at least 8 to 10 years.  7. 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 OAlvan Dame3 years ago. She has been maintained on hydrocodone 5/325 mg 1 tablet 3 times a day.  8.  She was newly diagnosed with diabetes with an A1c of 6.5 in July 2021.  She has elected to try lifestyle modifications before medication.  She has lost around 10 pounds since her diagnosis.  She would like to discuss 3 acute concerns:  1.  For the past 2 weeks she has been having burning with urination and increased frequency. 2.  She believes she has earwax. 3.  She is requesting pneumonia vaccination.   Past Medical/Surgical History: Past Medical History:  Diagnosis Date  . Allergy   . Anxiety   . Arthritis    PAIN AND OA RIGHT KNEE; PAIN AND DDD AND SPINAL STENOSIS  . Complication of anesthesia    PULLED OUT MY IV'S AND FOLEY -  HALLUCINATIONS WITH HIATAL HERNIA SURGERY  . Depression   . Diverticulosis    PER COLONOSCOPY  . GERD (gastroesophageal reflux disease)    OVER THE COUNTER MEDS IF NEEDED  . Heart murmur    "VERY SLIGHT ALL MY LIFE"  . Hyperlipidemia   . Hypertension   . Sleep apnea    does not tolerate CPAP - HAS NOT USED IN OVER 2 YRS    Past Surgical History:  Procedure Laterality Date  . COLONOSCOPY    . HIATAL HERNIA REPAIR  2008  . LUMBAR FUSION    . LUMBAR LAMINECTOMY    . PARTIAL KNEE ARTHROPLASTY Right 07/02/2014   Procedure: RIGHT MEDIAL UNICOMPARTMENTAL KNEE;  Surgeon: MMauri Pole MD;  Location: [facility];  Service: Orthopedics;  Laterality: Right;  . UPPER GASTROINTESTINAL ENDOSCOPY      Social History:  reports that she has never smoked. She has never used smokeless tobacco. She reports that she does not drink alcohol and does not use drugs.  Allergies: Allergies  Allergen Reactions  . Ambien [Zolpidem Tartrate]     amnesia  . Hydrochlorothiazide     Other reaction(s): Lethargy (intolerance)  . Prednisone     Rapid heart rate  . Thiazide-Type Diuretics Other (See Comments)    "could not physically move"  . Sulfamethoxazole Rash    Family History:  Family History  Problem Relation Age of Onset  . Heart disease Mother   . Heart disease Father   .  Heart disease Sister   . Pulmonary embolism Sister   . Diabetes Paternal Grandfather      Current Outpatient Medications:  .  Accu-Chek FastClix Lancets MISC, 1 each by Does not apply route daily., Disp: 100 each, Rfl: 12 .  acetaminophen (TYLENOL) 650 MG CR tablet, Take 650 mg by mouth every 8 (eight) hours as needed for pain., Disp: , Rfl:  .  amLODipine (NORVASC) 5 MG tablet, TAKE (1) TABLET DAILY FOR HIGH BLOOD PRESSURE., Disp: 90 tablet, Rfl: 1 .  Blood Glucose Monitoring Suppl (ACCU-CHEK GUIDE ME) w/Device KIT, 1 each by Does not apply route daily., Disp: 1 kit, Rfl: 2 .  calcium & magnesium carbonates (MYLANTA)  311-232 MG tablet, Take 1 tablet by mouth daily., Disp: , Rfl:  .  ciprofloxacin (CIPRO) 500 MG tablet, Take 1 tablet (500 mg total) by mouth 2 (two) times daily for 5 days., Disp: 10 tablet, Rfl: 0 .  famotidine (PEPCID) 20 MG tablet, Take 1 tablet (20 mg total) by mouth daily., Disp: 90 tablet, Rfl: 1 .  glucose blood (ACCU-CHEK GUIDE) test strip, 1 each by Other route daily. Use as instructed, Disp: 100 each, Rfl: 12 .  HYDROcodone-acetaminophen (NORCO/VICODIN) 5-325 MG tablet, Take 1 tablet by mouth 3 (three) times daily as needed for moderate pain., Disp: 90 tablet, Rfl: 0 .  lisinopril (ZESTRIL) 5 MG tablet, TAKE 1 TABLET IN THE MORNING AND 1 TABLET AT BEDTIME., Disp: 180 tablet, Rfl: 0 .  meloxicam (MOBIC) 15 MG tablet, TAKE 1 TABLET ONCE DAILY., Disp: 90 tablet, Rfl: 0 .  mometasone (NASONEX) 50 MCG/ACT nasal spray, Place 2 sprays into the nose at bedtime., Disp: 17 g, Rfl: 6 .  oxybutynin (DITROPAN-XL) 10 MG 24 hr tablet, TAKE 1 TABLET BY MOUTH DAILY., Disp: 90 tablet, Rfl: 1 .  polyvinyl alcohol (LIQUIFILM TEARS) 1.4 % ophthalmic solution, Place 1-2 drops into both eyes daily as needed for dry eyes., Disp: , Rfl:  .  simvastatin (ZOCOR) 20 MG tablet, Take 1 tablet (20 mg total) by mouth at bedtime., Disp: 90 tablet, Rfl: 1 .  triamcinolone cream (KENALOG) 0.1 %, APPLY TO RASH SPARINGLY ONCE A DAY., Disp: 30 g, Rfl: 0 .  ALPRAZolam (XANAX) 0.25 MG tablet, Take 1 tablet (0.25 mg total) by mouth 2 (two) times daily as needed for anxiety., Disp: 60 tablet, Rfl: 0 .  HYDROcodone-acetaminophen (NORCO/VICODIN) 5-325 MG tablet, TAKE 1 TABLET THREE TIMES DAILY AS NEEDED FOR MODERATE PAIN., Disp: 90 tablet, Rfl: 0 .  HYDROcodone-acetaminophen (NORCO/VICODIN) 5-325 MG tablet, Take 1 tablet by mouth 3 (three) times daily as needed for moderate pain., Disp: 90 tablet, Rfl: 0 .  HYDROcodone-acetaminophen (NORCO/VICODIN) 5-325 MG tablet, Take 1 tablet by mouth 3 (three) times daily as needed for moderate  pain., Disp: 90 tablet, Rfl: 0  Review of Systems:  Constitutional: Denies fever, chills, diaphoresis, appetite change and fatigue.  HEENT: Denies photophobia, eye pain, redness, hearing loss, ear pain, congestion, sore throat, rhinorrhea, sneezing, mouth sores, trouble swallowing, neck pain, neck stiffness and tinnitus.   Respiratory: Denies SOB, DOE, cough, chest tightness,  and wheezing.   Cardiovascular: Denies chest pain, palpitations and leg swelling.  Gastrointestinal: Denies nausea, vomiting, abdominal pain, diarrhea, constipation, blood in stool and abdominal distention.  Genitourinary: Denies , hematuria, flank pain and difficulty urinating.  Endocrine: Denies: hot or cold intolerance, sweats, changes in hair or nails, polyuria, polydipsia. Musculoskeletal: Denies myalgias, back pain, joint swelling, arthralgias and gait problem.  Skin: Denies pallor, rash and  wound.  Neurological: Denies dizziness, seizures, syncope, weakness, light-headedness, numbness and headaches.  Hematological: Denies adenopathy. Easy bruising, personal or family bleeding history  Psychiatric/Behavioral: Denies suicidal ideation, mood changes, confusion, nervousness, sleep disturbance and agitation    Physical Exam: Vitals:   10/12/20 1303  BP: 130/84  Temp: 98.4 F (36.9 C)  TempSrc: Oral  Weight: 178 lb 6.4 oz (80.9 kg)    Body mass index is 31.6 kg/m.   Constitutional: NAD, calm, comfortable Eyes: PERRL, lids and conjunctivae normal ENMT: Mucous membranes are moist.  Tympanic membrane is pearly white, no erythema or bulging. Respiratory: clear to auscultation bilaterally, no wheezing, no crackles. Normal respiratory effort. No accessory muscle use.  Cardiovascular: Regular rate and rhythm, no murmurs / rubs / gallops. No extremity edema.  Neurologic: Grossly intact and nonfocal. Psychiatric: Normal judgment and insight. Alert and oriented x 3. Normal mood.    Impression and Plan:  Type 2  diabetes mellitus without complication, without long-term current use of insulin (Jet)  -Remains well controlled with diet only with an A1c of 6.0 today.  Generalized anxiety disorder  - Plan: ALPRAZolam (XANAX) 0.25 MG tablet to take twice daily as needed.  Chronic pain syndrome High risk medication use  -PDMP reviewed, no red flags, overdose risk score is 90. -Refill hydrocodone 5/325 mg to take 1 tablet 3 times a day as needed for pain for 90 tablets a month x3 months.  Acute cystitis without hematuria  -Dipstick in office today is positive for leukocytes, ketones, protein. -Since she has a sulfa allergy, Cipro 500 mg twice daily for 5 days, urine will be sent for UA and culture.  Essential hypertension -Blood pressures well controlled.  Hyperlipidemia, unspecified hyperlipidemia type -LDL was 75 in April 2021, currently on simvastatin 20 mg daily.  Obesity (BMI 30-39.9) -Discussed healthy lifestyle, including increased physical activity and better food choices to promote weight loss.  Need for vaccination against Streptococcus pneumoniae -PPSV23 administered today.   Patient Instructions   -Nice seeing you today!!  -Start cipro 500 mg twice daily for 5 days for your urine infection.  -Schedule follow up in 3 months.   Urinary Tract Infection, Adult A urinary tract infection (UTI) is an infection of any part of the urinary tract. The urinary tract includes:  The kidneys.  The ureters.  The bladder.  The urethra. These organs make, store, and get rid of pee (urine) in the body. What are the causes? This infection is caused by germs (bacteria) in your genital area. These germs grow and cause swelling (inflammation) of your urinary tract. What increases the risk? The following factors may make you more likely to develop this condition:  Using a small, thin tube (catheter) to drain pee.  Not being able to control when you pee or poop (incontinence).  Being  female. If you are female, these things can increase the risk: ? Using these methods to prevent pregnancy:  A medicine that kills sperm (spermicide).  A device that blocks sperm (diaphragm). ? Having low levels of a female hormone (estrogen). ? Being pregnant. You are more likely to develop this condition if:  You have genes that add to your risk.  You are sexually active.  You take antibiotic medicines.  You have trouble peeing because of: ? A prostate that is bigger than normal, if you are female. ? A blockage in the part of your body that drains pee from the bladder. ? A kidney stone. ? A nerve condition that affects  your bladder. ? Not getting enough to drink. ? Not peeing often enough.  You have other conditions, such as: ? Diabetes. ? A weak disease-fighting system (immune system). ? Sickle cell disease. ? Gout. ? Injury of the spine. What are the signs or symptoms? Symptoms of this condition include:  Needing to pee right away.  Peeing small amounts often.  Pain or burning when peeing.  Blood in the pee.  Pee that smells bad or not like normal.  Trouble peeing.  Pee that is cloudy.  Fluid coming from the vagina, if you are female.  Pain in the belly or lower back. Other symptoms include:  Vomiting.  Not feeling hungry.  Feeling mixed up (confused). This may be the first symptom in older adults.  Being tired and grouchy (irritable).  A fever.  Watery poop (diarrhea). How is this treated?  Taking antibiotic medicine.  Taking other medicines.  Drinking enough water. In some cases, you may need to see a specialist. Follow these instructions at home: Medicines  Take over-the-counter and prescription medicines only as told by your doctor.  If you were prescribed an antibiotic medicine, take it as told by your doctor. Do not stop taking it even if you start to feel better. General instructions  Make sure you: ? Pee until your bladder is  empty. ? Do not hold pee for a long time. ? Empty your bladder after sex. ? Wipe from front to back after peeing or pooping if you are a female. Use each tissue one time when you wipe.  Drink enough fluid to keep your pee pale yellow.  Keep all follow-up visits.   Contact a doctor if:  You do not get better after 1-2 days.  Your symptoms go away and then come back. Get help right away if:  You have very bad back pain.  You have very bad pain in your lower belly.  You have a fever.  You have chills.  You feeling like you will vomit or you vomit. Summary  A urinary tract infection (UTI) is an infection of any part of the urinary tract.  This condition is caused by germs in your genital area.  There are many risk factors for a UTI.  Treatment includes antibiotic medicines.  Drink enough fluid to keep your pee pale yellow. This information is not intended to replace advice given to you by your health care provider. Make sure you discuss any questions you have with your health care provider. Document Revised: 04/01/2020 Document Reviewed: 04/01/2020 Elsevier Patient Education  2021 [place], MD [facility]

## 2020-10-12 NOTE — Addendum Note (Signed)
Addended by: Westley Hummer B on: 10/12/2020 04:26 PM   Modules accepted: Orders

## 2020-10-13 LAB — URINE CULTURE
MICRO NUMBER:: 11514235
SPECIMEN QUALITY:: ADEQUATE

## 2020-11-25 ENCOUNTER — Other Ambulatory Visit: Payer: Self-pay | Admitting: Internal Medicine

## 2020-11-25 DIAGNOSIS — F411 Generalized anxiety disorder: Secondary | ICD-10-CM

## 2020-12-13 ENCOUNTER — Other Ambulatory Visit: Payer: Self-pay

## 2020-12-13 ENCOUNTER — Ambulatory Visit: Payer: Medicare Other | Attending: Internal Medicine

## 2020-12-13 ENCOUNTER — Other Ambulatory Visit (HOSPITAL_BASED_OUTPATIENT_CLINIC_OR_DEPARTMENT_OTHER): Payer: Self-pay

## 2020-12-13 DIAGNOSIS — Z23 Encounter for immunization: Secondary | ICD-10-CM

## 2020-12-13 NOTE — Progress Notes (Signed)
   Covid-19 Vaccination Clinic  Name:  [name]    MRN: [id number] DOB: 10-07-1940  12/13/2020  [name] was observed post Covid-19 immunization for 15 minutes without incident. She was provided with Vaccine Information Sheet and instruction to access the V-Safe system.   [name] was instructed to call 911 with any severe reactions post vaccine: Marland Kitchen Difficulty breathing  . Swelling of face and throat  . A fast heartbeat  . A bad rash all over body  . Dizziness and weakness   Immunizations Administered    Name Date Dose VIS Date Route   PFIZER Comrnaty(Gray TOP) Covid-19 Vaccine 12/13/2020 12:10 PM 0.3 mL 08/11/2020 Intramuscular   Manufacturer: [place]   Lot: [id number]   NDC: [id number]

## 2020-12-16 ENCOUNTER — Other Ambulatory Visit (HOSPITAL_BASED_OUTPATIENT_CLINIC_OR_DEPARTMENT_OTHER): Payer: Self-pay

## 2020-12-16 MED ORDER — COVID-19 MRNA VACCINE (PFIZER) 30 MCG/0.3ML IM SUSP
INTRAMUSCULAR | 0 refills | Status: DC
  Filled 2020-12-16: qty 0.3, 1d supply, fill #0

## 2020-12-20 ENCOUNTER — Other Ambulatory Visit: Payer: Self-pay | Admitting: Internal Medicine

## 2020-12-20 DIAGNOSIS — F411 Generalized anxiety disorder: Secondary | ICD-10-CM

## 2020-12-20 DIAGNOSIS — E785 Hyperlipidemia, unspecified: Secondary | ICD-10-CM

## 2020-12-27 ENCOUNTER — Other Ambulatory Visit: Payer: Self-pay | Admitting: Internal Medicine

## 2020-12-27 DIAGNOSIS — I10 Essential (primary) hypertension: Secondary | ICD-10-CM

## 2021-01-11 ENCOUNTER — Encounter: Payer: Medicare Other | Admitting: Internal Medicine

## 2021-01-11 ENCOUNTER — Ambulatory Visit: Payer: Medicare Other | Admitting: Internal Medicine

## 2021-01-13 ENCOUNTER — Other Ambulatory Visit: Payer: Self-pay | Admitting: Internal Medicine

## 2021-01-13 DIAGNOSIS — Z79899 Other long term (current) drug therapy: Secondary | ICD-10-CM

## 2021-01-13 DIAGNOSIS — G894 Chronic pain syndrome: Secondary | ICD-10-CM

## 2021-01-13 MED ORDER — HYDROCODONE-ACETAMINOPHEN 5-325 MG PO TABS: 1.0000 | ORAL_TABLET | Freq: Three times a day (TID) | ORAL | 0 refills | Status: DC | PRN

## 2021-01-13 NOTE — Telephone Encounter (Signed)
Last office visit and refill 10/12/20. Will need office visit?

## 2021-01-13 NOTE — Telephone Encounter (Signed)
Patient called after hours line to request refill for HYDROcodone-acetaminophen (NORCO/VICODIN) 5-325 MG tablet  Send refill to: [place], [place], [place] [address]  Phone:  [phone] Fax:  [phone]

## 2021-01-13 NOTE — Addendum Note (Signed)
Addended by: Westley Hummer B on: 01/13/2021 02:51 PM   Modules accepted: Orders

## 2021-01-13 NOTE — Telephone Encounter (Signed)
Patient has an appointment 02/16/21

## 2021-01-23 ENCOUNTER — Other Ambulatory Visit: Payer: Self-pay | Admitting: Internal Medicine

## 2021-01-23 DIAGNOSIS — F411 Generalized anxiety disorder: Secondary | ICD-10-CM

## 2021-01-23 DIAGNOSIS — I10 Essential (primary) hypertension: Secondary | ICD-10-CM

## 2021-01-24 ENCOUNTER — Other Ambulatory Visit: Payer: Self-pay | Admitting: Internal Medicine

## 2021-01-24 DIAGNOSIS — Z79899 Other long term (current) drug therapy: Secondary | ICD-10-CM

## 2021-01-24 DIAGNOSIS — F411 Generalized anxiety disorder: Secondary | ICD-10-CM

## 2021-01-24 DIAGNOSIS — I10 Essential (primary) hypertension: Secondary | ICD-10-CM

## 2021-01-24 DIAGNOSIS — G894 Chronic pain syndrome: Secondary | ICD-10-CM

## 2021-01-24 NOTE — Telephone Encounter (Signed)
HYDROcodone-acetaminophen (NORCO/VICODIN) 5-325 MG tablet filled 01/14/23  Xanax filled 01/24/21  Lisinopril filled 01/24/21   Please deny

## 2021-02-16 ENCOUNTER — Encounter: Payer: Self-pay | Admitting: Internal Medicine

## 2021-02-16 ENCOUNTER — Ambulatory Visit (INDEPENDENT_AMBULATORY_CARE_PROVIDER_SITE_OTHER): Payer: Medicare Other | Admitting: Internal Medicine

## 2021-02-16 ENCOUNTER — Other Ambulatory Visit: Payer: Self-pay

## 2021-02-16 VITALS — BP 130/80 | HR 78 | Temp 98.0°F | Ht 63.0 in | Wt 174.9 lb

## 2021-02-16 DIAGNOSIS — E785 Hyperlipidemia, unspecified: Secondary | ICD-10-CM | POA: Diagnosis not present

## 2021-02-16 DIAGNOSIS — G894 Chronic pain syndrome: Secondary | ICD-10-CM | POA: Diagnosis not present

## 2021-02-16 DIAGNOSIS — Z0000 Encounter for general adult medical examination without abnormal findings: Secondary | ICD-10-CM

## 2021-02-16 DIAGNOSIS — Z13820 Encounter for screening for osteoporosis: Secondary | ICD-10-CM | POA: Diagnosis not present

## 2021-02-16 DIAGNOSIS — I10 Essential (primary) hypertension: Secondary | ICD-10-CM | POA: Diagnosis not present

## 2021-02-16 DIAGNOSIS — K219 Gastro-esophageal reflux disease without esophagitis: Secondary | ICD-10-CM | POA: Diagnosis not present

## 2021-02-16 DIAGNOSIS — Z79899 Other long term (current) drug therapy: Secondary | ICD-10-CM

## 2021-02-16 DIAGNOSIS — E1139 Type 2 diabetes mellitus with other diabetic ophthalmic complication: Secondary | ICD-10-CM

## 2021-02-16 DIAGNOSIS — E669 Obesity, unspecified: Secondary | ICD-10-CM

## 2021-02-16 DIAGNOSIS — F411 Generalized anxiety disorder: Secondary | ICD-10-CM

## 2021-02-16 LAB — COMPREHENSIVE METABOLIC PANEL
ALT: 7 U/L (ref 0–35)
AST: 11 U/L (ref 0–37)
Albumin: 4.3 g/dL (ref 3.5–5.2)
Alkaline Phosphatase: 36 U/L — ABNORMAL LOW (ref 39–117)
BUN: 17 mg/dL (ref 6–23)
CO2: 25 mEq/L (ref 19–32)
Calcium: 9.6 mg/dL (ref 8.4–10.5)
Chloride: 102 mEq/L (ref 96–112)
Creatinine, Ser: 0.78 mg/dL (ref 0.40–1.20)
GFR: 71.5 mL/min (ref >60.00)
Glucose, Bld: 110 mg/dL — ABNORMAL HIGH (ref 70–99)
Potassium: 3.8 mEq/L (ref 3.5–5.1)
Sodium: 137 mEq/L (ref 135–145)
Total Bilirubin: 0.7 mg/dL (ref 0.2–1.2)
Total Protein: 7.3 g/dL (ref 6.0–8.3)

## 2021-02-16 LAB — CBC WITH DIFFERENTIAL/PLATELET
Basophils Absolute: 0.1 10*3/uL (ref 0.0–0.1)
Basophils Relative: 1.2 % (ref 0.0–3.0)
Eosinophils Absolute: 0.2 10*3/uL (ref 0.0–0.7)
Eosinophils Relative: 3.1 % (ref 0.0–5.0)
HCT: 40.3 % (ref 36.0–46.0)
Hemoglobin: 13.6 g/dL (ref 12.0–15.0)
Lymphocytes Relative: 41 % (ref 12.0–46.0)
Lymphs Abs: 2.4 10*3/uL (ref 0.7–4.0)
MCHC: 33.7 g/dL (ref 30.0–36.0)
MCV: 97.3 fl (ref 78.0–100.0)
Monocytes Absolute: 0.5 10*3/uL (ref 0.1–1.0)
Monocytes Relative: 8.1 % (ref 3.0–12.0)
Neutro Abs: 2.7 10*3/uL (ref 1.4–7.7)
Neutrophils Relative %: 46.6 % (ref 43.0–77.0)
Platelets: 231 10*3/uL (ref 150.0–400.0)
RBC: 4.14 Mil/uL (ref 3.87–5.11)
RDW: 13.2 % (ref 11.5–15.5)
WBC: 5.8 10*3/uL (ref 4.0–10.5)

## 2021-02-16 LAB — LIPID PANEL
Cholesterol: 137 mg/dL (ref 0–200)
HDL: 49.6 mg/dL (ref >39.00)
LDL Cholesterol: 57 mg/dL (ref 0–99)
NonHDL: 87.63
Total CHOL/HDL Ratio: 3
Triglycerides: 151 mg/dL — ABNORMAL HIGH (ref 0.0–149.0)
VLDL: 30.2 mg/dL (ref 0.0–40.0)

## 2021-02-16 LAB — VITAMIN D 25 HYDROXY (VIT D DEFICIENCY, FRACTURES): VITD: 64.66 ng/mL (ref 30.00–100.00)

## 2021-02-16 LAB — TSH: TSH: 2.48 u[IU]/mL (ref 0.35–4.50)

## 2021-02-16 LAB — VITAMIN B12: Vitamin B-12: 402 pg/mL (ref 211–911)

## 2021-02-16 LAB — HEMOGLOBIN A1C: Hgb A1c MFr Bld: 6.1 % (ref 4.6–6.5)

## 2021-02-16 MED ORDER — AMLODIPINE BESYLATE 5 MG PO TABS: ORAL_TABLET | ORAL | 1 refills | Status: DC

## 2021-02-16 MED ORDER — SIMVASTATIN 20 MG PO TABS: 20.0000 mg | ORAL_TABLET | Freq: Every day | ORAL | 1 refills | Status: DC

## 2021-02-16 MED ORDER — HYDROCODONE-ACETAMINOPHEN 5-325 MG PO TABS: 1.0000 | ORAL_TABLET | Freq: Three times a day (TID) | ORAL | 0 refills | Status: DC | PRN

## 2021-02-16 MED ORDER — LISINOPRIL 5 MG PO TABS: ORAL_TABLET | ORAL | 0 refills | Status: DC

## 2021-02-16 MED ORDER — ALPRAZOLAM 0.25 MG PO TABS: 0.2500 mg | ORAL_TABLET | Freq: Two times a day (BID) | ORAL | 0 refills | Status: DC | PRN

## 2021-02-16 MED ORDER — MELOXICAM 15 MG PO TABS: 1.0000 | ORAL_TABLET | Freq: Every day | ORAL | 0 refills | Status: DC

## 2021-02-16 NOTE — Progress Notes (Signed)
Established Patient Office Visit     This visit occurred during the SARS-CoV-2 public health emergency.  Safety protocols were in place, including screening questions prior to the visit, additional usage of staff PPE, and extensive cleaning of exam room while observing appropriate contact time as indicated for disinfecting solutions.    CC/Reason for Visit: Annual preventive exam and subsequent Medicare wellness visit  HPI: [name] is a 80 y.o. female who is coming in today for the above mentioned reasons. Past Medical History is significant for: Hypertension, obstructive sleep apnea who is intolerant of CPAP, GERD, overactive bladder, hyperlipidemia, depression/anxiety for which she takes alprazolam 0.25 mg twice daily as needed, chronic pain syndrome who has a pain contract with me for hydrocodone 1 tablet 3 times a day, type 2 diabetes.  She has been doing well and has no acute concerns today.  She has routine eye and dental care.  She has no perceived hearing issues, she does not exercise routinely, she has elected to defer further cancer screening due to age, she is due for bone density test, she is due for shingles and Tdap, she has completed her COVID series.   Past Medical/Surgical History: Past Medical History:  Diagnosis Date   Allergy    Anxiety    Arthritis    PAIN AND OA RIGHT KNEE; PAIN AND DDD AND SPINAL STENOSIS   Complication of anesthesia    PULLED OUT MY IV'S AND FOLEY - HALLUCINATIONS WITH HIATAL HERNIA SURGERY   Depression    Diverticulosis    PER COLONOSCOPY   GERD (gastroesophageal reflux disease)    OVER THE COUNTER MEDS IF NEEDED   Heart murmur    "VERY SLIGHT ALL MY LIFE"   Hyperlipidemia    Hypertension    Sleep apnea    does not tolerate CPAP - HAS NOT USED IN OVER 2 YRS    Past Surgical History:  Procedure Laterality Date   COLONOSCOPY     HIATAL HERNIA REPAIR  2008   LUMBAR FUSION     LUMBAR LAMINECTOMY     PARTIAL KNEE  ARTHROPLASTY Right 07/02/2014   Procedure: RIGHT MEDIAL UNICOMPARTMENTAL KNEE;  Surgeon: Mauri Pole, MD;  Location: [facility];  Service: Orthopedics;  Laterality: Right;   UPPER GASTROINTESTINAL ENDOSCOPY      Social History:  reports that she has never smoked. She has never used smokeless tobacco. She reports that she does not drink alcohol and does not use drugs.  Allergies: Allergies  Allergen Reactions   Ambien [Zolpidem Tartrate]     amnesia   Hydrochlorothiazide     Other reaction(s): Lethargy (intolerance)   Prednisone     Rapid heart rate   Thiazide-Type Diuretics Other (See Comments)    "could not physically move"   Sulfamethoxazole Rash    Family History:  Family History  Problem Relation Age of Onset   Heart disease Mother    Heart disease Father    Heart disease Sister    Pulmonary embolism Sister    Diabetes Paternal Grandfather      Current Outpatient Medications:    Accu-Chek FastClix Lancets MISC, 1 each by Does not apply route daily., Disp: 100 each, Rfl: 12   acetaminophen (TYLENOL) 650 MG CR tablet, Take 650 mg by mouth every 8 (eight) hours as needed for pain., Disp: , Rfl:    Blood Glucose Monitoring Suppl (ACCU-CHEK GUIDE ME) w/Device KIT, 1 each by Does not apply route daily., Disp: 1  kit, Rfl: 2   calcium & magnesium carbonates (MYLANTA) 311-232 MG tablet, Take 1 tablet by mouth daily., Disp: , Rfl:    COVID-19 mRNA vaccine, Pfizer, 30 MCG/0.3ML injection, Inject into the muscle., Disp: 0.3 mL, Rfl: 0   famotidine (PEPCID) 20 MG tablet, Take 1 tablet (20 mg total) by mouth daily., Disp: 90 tablet, Rfl: 1   glucose blood (ACCU-CHEK GUIDE) test strip, 1 each by Other route daily. Use as instructed, Disp: 100 each, Rfl: 12   mometasone (NASONEX) 50 MCG/ACT nasal spray, Place 2 sprays into the nose at bedtime., Disp: 17 g, Rfl: 6   oxybutynin (DITROPAN-XL) 10 MG 24 hr tablet, TAKE 1 TABLET BY MOUTH DAILY., Disp: 90 tablet, Rfl: 1   polyvinyl alcohol  (LIQUIFILM TEARS) 1.4 % ophthalmic solution, Place 1-2 drops into both eyes daily as needed for dry eyes., Disp: , Rfl:    triamcinolone cream (KENALOG) 0.1 %, APPLY TO RASH SPARINGLY ONCE A DAY., Disp: 30 g, Rfl: 0   ALPRAZolam (XANAX) 0.25 MG tablet, Take 1 tablet (0.25 mg total) by mouth 2 (two) times daily as needed. for anxiety, Disp: 60 tablet, Rfl: 0   amLODipine (NORVASC) 5 MG tablet, Take 1 tablet by mouth daily., Disp: 90 tablet, Rfl: 1   HYDROcodone-acetaminophen (NORCO/VICODIN) 5-325 MG tablet, Take 1 tablet by mouth 3 (three) times daily as needed for moderate pain., Disp: 90 tablet, Rfl: 0   HYDROcodone-acetaminophen (NORCO/VICODIN) 5-325 MG tablet, Take 1 tablet by mouth 3 (three) times daily as needed for moderate pain., Disp: 90 tablet, Rfl: 0   HYDROcodone-acetaminophen (NORCO/VICODIN) 5-325 MG tablet, Take 1 tablet by mouth 3 (three) times daily as needed for moderate pain., Disp: 90 tablet, Rfl: 0   lisinopril (ZESTRIL) 5 MG tablet, TAKE ONE TABLET IN THE MORNING AND ONE TABLET AT BEDTIME., Disp: 180 tablet, Rfl: 0   meloxicam (MOBIC) 15 MG tablet, Take 1 tablet (15 mg total) by mouth daily., Disp: 90 tablet, Rfl: 0   simvastatin (ZOCOR) 20 MG tablet, Take 1 tablet (20 mg total) by mouth at bedtime., Disp: 90 tablet, Rfl: 1  Review of Systems:  Constitutional: Denies fever, chills, diaphoresis, appetite change and fatigue.  HEENT: Denies photophobia, eye pain, redness, hearing loss, ear pain, congestion, sore throat, rhinorrhea, sneezing, mouth sores, trouble swallowing, neck pain, neck stiffness and tinnitus.   Respiratory: Denies SOB, DOE, cough, chest tightness,  and wheezing.   Cardiovascular: Denies chest pain, palpitations and leg swelling.  Gastrointestinal: Denies nausea, vomiting, abdominal pain, diarrhea, constipation, blood in stool and abdominal distention.  Genitourinary: Denies dysuria, urgency, frequency, hematuria, flank pain and difficulty urinating.  Endocrine:  Denies: hot or cold intolerance, sweats, changes in hair or nails, polyuria, polydipsia. Musculoskeletal: Denies myalgias, back pain, joint swelling, arthralgias and gait problem.  Skin: Denies pallor, rash and wound.  Neurological: Denies dizziness, seizures, syncope, weakness, light-headedness, numbness and headaches.  Hematological: Denies adenopathy. Easy bruising, personal or family bleeding history  Psychiatric/Behavioral: Denies suicidal ideation, mood changes, confusion, nervousness, sleep disturbance and agitation    Physical Exam: Vitals:   02/16/21 0755  BP: 130/80  Pulse: 78  Temp: 98 F (36.7 C)  TempSrc: Oral  SpO2: 98%  Weight: 174 lb 14.4 oz (79.3 kg)  Height: '5\' 3"'  (1.6 m)    Body mass index is 30.98 kg/m.   Constitutional: NAD, calm, comfortable Eyes: PERRL, lids and conjunctivae normal, wears corrective lenses ENMT: Mucous membranes are moist. Posterior pharynx clear of any exudate or lesions. Normal dentition.  Tympanic membrane is pearly white, no erythema or bulging. Neck: normal, supple, no masses, no thyromegaly Respiratory: clear to auscultation bilaterally, no wheezing, no crackles. Normal respiratory effort. No accessory muscle use.  Cardiovascular: Regular rate and rhythm, positive systolic ejection murmur, no rubs / gallops. No extremity edema. 2+ pedal pulses. No carotid bruits.  Abdomen: no tenderness, no masses palpated. No hepatosplenomegaly. Bowel sounds positive.  Musculoskeletal: no clubbing / cyanosis. No joint deformity upper and lower extremities. Good ROM, no contractures. Normal muscle tone.  Skin: no rashes, lesions, ulcers. No induration Neurologic: CN 2-12 grossly intact. Sensation intact, DTR normal. Strength 5/5 in all 4.  Psychiatric: Normal judgment and insight. Alert and oriented x 3. Normal mood.    Subsequent Medicare wellness visit   1. Risk factors, based on past  M,S,F -cardiovascular disease risk factors include age,  gender, history of hyperlipidemia, history of hypertension, history of type 2 diabetes   2.  Physical activities: Sedentary   3.  Depression/mood: History of depression but mood is currently stable   4.  Hearing: No perceived issues   5.  ADL's: Independent in all ADLs   6.  Fall risk: Low fall risk   7.  Home safety: No problems identified   8.  Height weight, and visual acuity: height and weight as above, vision:  Vision Screening   Right eye Left eye Both eyes  Without correction     With correction '20/20 20/20 20/20 '     9.  Counseling: Counseled on lifestyle changes   10. Lab orders based on risk factors: Laboratory update will be reviewed   11. Referral : None today   12. Care plan: Follow-up with me in 3 months   13. Cognitive assessment: No cognitive impairment   14. Screening: Patient provided with a written and personalized 5-10 year screening schedule in the AVS. yes   15. Provider List Update: PCP, ophthalmology  16. Advance Directives: Full code   17. Opioids: Patient is on chronic opioids. She has a signed pain contract with me. Has not displayed any signs of an opioid-use disorder.   [place] Office Visit from 12/11/2019 in [place] at [place]  PHQ-9 Total Score 2       Fall Risk  02/16/2021 05/02/2020 12/11/2019 07/23/2019 10/21/2015  Falls in the past year? 0 0 1 0 No  Number falls in past yr: - - 1 0 -  Injury with Fall? - - 0 0 -  Risk for fall due to : - - - - -     Impression and Plan:  Encounter for preventive health examination  -She has routine eye and dental care. -She is due for shingles and Tdap vaccines which she will get at her pharmacy, otherwise immunizations are up-to-date. -Screening labs today. -Healthy lifestyle discussed in detail. -DEXA scan for osteoporosis screening. -She has elected to defer all cancer screening due to age, I agree with this decision.  Screening for osteoporosis  - Plan: DG Bone  Density  Essential hypertension  -Blood pressure is well controlled on amlodipine and lisinopril.  Hyperlipidemia, unspecified hyperlipidemia type  - Plan: simvastatin (ZOCOR) 20 MG tablet, Lipid panel  Type 2 diabetes mellitus with other ophthalmic complication, without long-term current use of insulin (HCC) -Check A1c today.  Obesity (BMI 30-39.9) -Discussed healthy lifestyle, including increased physical activity and better food choices to promote weight loss.  Gastroesophageal reflux disease without esophagitis -Stable on daily PPI therapy.  Chronic pain syndrome  High risk  medication use  -PDMP reviewed, no red flags, overdose risk score is 60. -Refill hydrocodone 5/325 mg to take 1 tablet every 8 hours as needed for total of 90 tablets a month x3 months.  Generalized anxiety disorder  - Plan: ALPRAZolam Duanne Moron) 0.25 MG tablet    Patient Instructions  -Nice seeing you today!!  -Lab work today; will notify you once results are available.  -Remember your shingles and tetanus vaccines.  -Schedule follow up in 3 months.     Lelon Frohlich, MD [facility] at [facility]

## 2021-02-16 NOTE — Patient Instructions (Signed)
-  Nice seeing you today!!  -Lab work today; will notify you once results are available.  -Remember your shingles and tetanus vaccines.  -Schedule follow up in 3 months.

## 2021-02-21 ENCOUNTER — Telehealth: Payer: Self-pay | Admitting: Internal Medicine

## 2021-02-21 DIAGNOSIS — Z1239 Encounter for other screening for malignant neoplasm of breast: Secondary | ICD-10-CM

## 2021-02-21 NOTE — Telephone Encounter (Signed)
Referral placed.

## 2021-02-21 NOTE — Telephone Encounter (Signed)
Patient needs a referral for her mammogram

## 2021-02-21 NOTE — Addendum Note (Signed)
Addended by: Westley Hummer B on: 02/21/2021 01:11 PM   Modules accepted: Orders

## 2021-02-27 ENCOUNTER — Inpatient Hospital Stay: Admission: RE | Admit: 2021-02-27 | Payer: Medicare Other | Source: Ambulatory Visit

## 2021-03-17 DIAGNOSIS — L820 Inflamed seborrheic keratosis: Secondary | ICD-10-CM | POA: Diagnosis not present

## 2021-03-17 DIAGNOSIS — L304 Erythema intertrigo: Secondary | ICD-10-CM | POA: Diagnosis not present

## 2021-03-17 DIAGNOSIS — L821 Other seborrheic keratosis: Secondary | ICD-10-CM | POA: Diagnosis not present

## 2021-03-17 DIAGNOSIS — D1801 Hemangioma of skin and subcutaneous tissue: Secondary | ICD-10-CM | POA: Diagnosis not present

## 2021-03-17 DIAGNOSIS — L819 Disorder of pigmentation, unspecified: Secondary | ICD-10-CM | POA: Diagnosis not present

## 2021-03-17 DIAGNOSIS — L814 Other melanin hyperpigmentation: Secondary | ICD-10-CM | POA: Diagnosis not present

## 2021-03-17 DIAGNOSIS — L400 Psoriasis vulgaris: Secondary | ICD-10-CM | POA: Diagnosis not present

## 2021-03-29 ENCOUNTER — Other Ambulatory Visit: Payer: Self-pay | Admitting: Internal Medicine

## 2021-03-29 DIAGNOSIS — F411 Generalized anxiety disorder: Secondary | ICD-10-CM

## 2021-04-12 ENCOUNTER — Telehealth: Payer: Self-pay | Admitting: Internal Medicine

## 2021-04-12 NOTE — Telephone Encounter (Signed)
PT needs a refill of their HYDROcodone-acetaminophen (NORCO/VICODIN) 5-325 MG tablet sent into the [facility] Rx on file.

## 2021-04-12 NOTE — Telephone Encounter (Signed)
Spoke pharmacist at [facility] and Rx will be filled today.

## 2021-04-24 ENCOUNTER — Other Ambulatory Visit: Payer: Self-pay | Admitting: Internal Medicine

## 2021-04-24 DIAGNOSIS — N3281 Overactive bladder: Secondary | ICD-10-CM

## 2021-05-01 ENCOUNTER — Telehealth: Payer: Self-pay

## 2021-05-01 ENCOUNTER — Other Ambulatory Visit: Payer: Self-pay | Admitting: Internal Medicine

## 2021-05-01 DIAGNOSIS — F411 Generalized anxiety disorder: Secondary | ICD-10-CM

## 2021-05-01 NOTE — Telephone Encounter (Signed)
Patient called requesting Rx refill  ALPRAZolam (XANAX) 0.25 MG tablet

## 2021-05-02 NOTE — Telephone Encounter (Signed)
Rx sent 

## 2021-05-04 ENCOUNTER — Other Ambulatory Visit: Payer: Self-pay | Admitting: Internal Medicine

## 2021-05-04 DIAGNOSIS — Z1239 Encounter for other screening for malignant neoplasm of breast: Secondary | ICD-10-CM

## 2021-05-19 ENCOUNTER — Encounter (INDEPENDENT_AMBULATORY_CARE_PROVIDER_SITE_OTHER): Payer: Medicare Other | Admitting: Internal Medicine

## 2021-05-19 ENCOUNTER — Emergency Department (HOSPITAL_COMMUNITY)
Admission: EM | Admit: 2021-05-19 | Discharge: 2021-05-20 | Disposition: A | Discharge status: Home / Self Care | Payer: Medicare Other | Attending: Emergency Medicine | Admitting: Emergency Medicine

## 2021-05-19 ENCOUNTER — Encounter (HOSPITAL_COMMUNITY): Payer: Self-pay | Admitting: Emergency Medicine

## 2021-05-19 ENCOUNTER — Encounter: Payer: Self-pay | Admitting: Internal Medicine

## 2021-05-19 ENCOUNTER — Other Ambulatory Visit: Payer: Self-pay

## 2021-05-19 VITALS — BP 120/84 | HR 75 | Temp 98.5°F | Wt 175.5 lb

## 2021-05-19 DIAGNOSIS — M79641 Pain in right hand: Secondary | ICD-10-CM | POA: Insufficient documentation

## 2021-05-19 DIAGNOSIS — Z23 Encounter for immunization: Secondary | ICD-10-CM

## 2021-05-19 DIAGNOSIS — E785 Hyperlipidemia, unspecified: Secondary | ICD-10-CM

## 2021-05-19 DIAGNOSIS — Z5321 Procedure and treatment not carried out due to patient leaving prior to being seen by health care provider: Secondary | ICD-10-CM | POA: Diagnosis not present

## 2021-05-19 DIAGNOSIS — G894 Chronic pain syndrome: Secondary | ICD-10-CM

## 2021-05-19 DIAGNOSIS — N3281 Overactive bladder: Secondary | ICD-10-CM

## 2021-05-19 DIAGNOSIS — Z79899 Other long term (current) drug therapy: Secondary | ICD-10-CM

## 2021-05-19 DIAGNOSIS — I10 Essential (primary) hypertension: Secondary | ICD-10-CM

## 2021-05-19 DIAGNOSIS — K219 Gastro-esophageal reflux disease without esophagitis: Secondary | ICD-10-CM

## 2021-05-19 DIAGNOSIS — M7989 Other specified soft tissue disorders: Secondary | ICD-10-CM | POA: Diagnosis not present

## 2021-05-19 DIAGNOSIS — E1139 Type 2 diabetes mellitus with other diabetic ophthalmic complication: Secondary | ICD-10-CM

## 2021-05-19 LAB — POCT GLYCOSYLATED HEMOGLOBIN (HGB A1C): Hemoglobin A1C: 6.3 % — AB (ref 4.0–5.6)

## 2021-05-19 MED ORDER — ACCU-CHEK GUIDE ME W/DEVICE KIT: 1.0000 | PACK | Freq: Every day | 2 refills | Status: AC

## 2021-05-19 MED ORDER — HYDROCODONE-ACETAMINOPHEN 5-325 MG PO TABS: 1.0000 | ORAL_TABLET | Freq: Three times a day (TID) | ORAL | 0 refills | Status: DC | PRN

## 2021-05-19 MED ORDER — AMLODIPINE BESYLATE 5 MG PO TABS: ORAL_TABLET | ORAL | 1 refills | Status: DC

## 2021-05-19 MED ORDER — LISINOPRIL 5 MG PO TABS: ORAL_TABLET | ORAL | 0 refills | Status: DC

## 2021-05-19 MED ORDER — TRIAMCINOLONE ACETONIDE 0.1 % EX CREA: TOPICAL_CREAM | CUTANEOUS | 0 refills | Status: DC

## 2021-05-19 MED ORDER — ACCU-CHEK GUIDE VI STRP: 1.0000 | ORAL_STRIP | Freq: Every day | 12 refills | Status: AC

## 2021-05-19 MED ORDER — FAMOTIDINE 20 MG PO TABS: 20.0000 mg | ORAL_TABLET | Freq: Every day | ORAL | 1 refills | Status: DC

## 2021-05-19 MED ORDER — MOMETASONE FUROATE 50 MCG/ACT NA SUSP: 2.0000 | Freq: Every day | NASAL | 6 refills | Status: DC

## 2021-05-19 MED ORDER — SIMVASTATIN 20 MG PO TABS: 20.0000 mg | ORAL_TABLET | Freq: Every day | ORAL | 1 refills | Status: DC

## 2021-05-19 MED ORDER — MELOXICAM 15 MG PO TABS
15.0000 mg | ORAL_TABLET | Freq: Every day | ORAL | 0 refills | Status: DC
Start: 2021-05-19 — End: 2021-10-04

## 2021-05-19 MED ORDER — OXYBUTYNIN CHLORIDE ER 10 MG PO TB24: 10.0000 mg | ORAL_TABLET | Freq: Every day | ORAL | 1 refills | Status: DC

## 2021-05-19 MED ORDER — ACCU-CHEK FASTCLIX LANCETS MISC: 1.0000 | Freq: Every day | 12 refills | Status: AC

## 2021-05-19 NOTE — ED Triage Notes (Signed)
Patient arrives after being stung by what she thinks was a carpenter bee while attempting to do laundry. Patient has some swelling to right hand, endorses pain.

## 2021-05-20 NOTE — ED Notes (Signed)
Pt turned in labels and advised screening staff that she was leaving. Triage RN aware.

## 2021-05-20 NOTE — ED Notes (Signed)
Patient not visualized in lobby or bathroom

## 2021-05-29 ENCOUNTER — Other Ambulatory Visit: Payer: Self-pay | Admitting: Internal Medicine

## 2021-05-29 DIAGNOSIS — F411 Generalized anxiety disorder: Secondary | ICD-10-CM

## 2021-06-13 ENCOUNTER — Ambulatory Visit: Payer: Medicare Other

## 2021-06-30 ENCOUNTER — Other Ambulatory Visit: Payer: Self-pay | Admitting: Internal Medicine

## 2021-06-30 DIAGNOSIS — F411 Generalized anxiety disorder: Secondary | ICD-10-CM

## 2021-07-06 DIAGNOSIS — H04123 Dry eye syndrome of bilateral lacrimal glands: Secondary | ICD-10-CM | POA: Diagnosis not present

## 2021-07-06 DIAGNOSIS — H524 Presbyopia: Secondary | ICD-10-CM | POA: Diagnosis not present

## 2021-07-06 DIAGNOSIS — Z961 Presence of intraocular lens: Secondary | ICD-10-CM | POA: Diagnosis not present

## 2021-07-06 DIAGNOSIS — H5203 Hypermetropia, bilateral: Secondary | ICD-10-CM | POA: Diagnosis not present

## 2021-07-06 DIAGNOSIS — H52203 Unspecified astigmatism, bilateral: Secondary | ICD-10-CM | POA: Diagnosis not present

## 2021-07-06 LAB — HM DIABETES EYE EXAM

## 2021-07-09 ENCOUNTER — Other Ambulatory Visit: Payer: Self-pay | Admitting: Internal Medicine

## 2021-07-09 DIAGNOSIS — G894 Chronic pain syndrome: Secondary | ICD-10-CM

## 2021-07-09 DIAGNOSIS — Z79899 Other long term (current) drug therapy: Secondary | ICD-10-CM

## 2021-07-11 ENCOUNTER — Other Ambulatory Visit: Payer: Self-pay

## 2021-07-11 ENCOUNTER — Ambulatory Visit
Admission: RE | Admit: 2021-07-11 | Discharge: 2021-07-11 | Disposition: A | Discharge status: Home / Self Care | Payer: Medicare Other | Source: Ambulatory Visit | Attending: Internal Medicine | Admitting: Internal Medicine

## 2021-07-11 DIAGNOSIS — Z1239 Encounter for other screening for malignant neoplasm of breast: Secondary | ICD-10-CM

## 2021-07-11 DIAGNOSIS — Z1231 Encounter for screening mammogram for malignant neoplasm of breast: Secondary | ICD-10-CM | POA: Diagnosis not present

## 2021-07-12 NOTE — Telephone Encounter (Signed)
Last office visit 05/19/21

## 2021-07-13 ENCOUNTER — Telehealth: Payer: Self-pay

## 2021-07-13 ENCOUNTER — Other Ambulatory Visit: Payer: Self-pay | Admitting: Internal Medicine

## 2021-07-13 DIAGNOSIS — R928 Other abnormal and inconclusive findings on diagnostic imaging of breast: Secondary | ICD-10-CM

## 2021-07-13 NOTE — Telephone Encounter (Signed)
Left detailed message on machine for patient to call her pharmacy for her refill.

## 2021-07-13 NOTE — Telephone Encounter (Signed)
Patient called asking for a call back to discuss what she should do next

## 2021-07-14 ENCOUNTER — Other Ambulatory Visit: Payer: Self-pay | Admitting: Family Medicine

## 2021-07-23 ENCOUNTER — Encounter: Payer: Self-pay | Admitting: Internal Medicine

## 2021-07-31 ENCOUNTER — Other Ambulatory Visit: Payer: Self-pay | Admitting: Internal Medicine

## 2021-07-31 DIAGNOSIS — F411 Generalized anxiety disorder: Secondary | ICD-10-CM

## 2021-08-02 ENCOUNTER — Other Ambulatory Visit: Payer: Self-pay | Admitting: Internal Medicine

## 2021-08-02 DIAGNOSIS — F411 Generalized anxiety disorder: Secondary | ICD-10-CM

## 2021-08-02 NOTE — Telephone Encounter (Signed)
Patient called to follow up on her request for   ALPRAZolam (XANAX) 0.25 MG tablet. Patient is scheduled to come in 12/17   Please send to [place], [address]

## 2021-08-09 ENCOUNTER — Other Ambulatory Visit: Payer: Self-pay | Admitting: Internal Medicine

## 2021-08-09 ENCOUNTER — Ambulatory Visit
Admission: RE | Admit: 2021-08-09 | Discharge: 2021-08-09 | Disposition: A | Discharge status: Home / Self Care | Payer: Medicare Other | Source: Ambulatory Visit | Attending: Internal Medicine | Admitting: Internal Medicine

## 2021-08-09 ENCOUNTER — Other Ambulatory Visit: Payer: Self-pay

## 2021-08-09 DIAGNOSIS — R928 Other abnormal and inconclusive findings on diagnostic imaging of breast: Secondary | ICD-10-CM

## 2021-08-09 DIAGNOSIS — R922 Inconclusive mammogram: Secondary | ICD-10-CM | POA: Diagnosis not present

## 2021-08-10 ENCOUNTER — Ambulatory Visit: Payer: Medicare Other | Attending: Internal Medicine

## 2021-08-10 ENCOUNTER — Other Ambulatory Visit (HOSPITAL_BASED_OUTPATIENT_CLINIC_OR_DEPARTMENT_OTHER): Payer: Self-pay

## 2021-08-10 DIAGNOSIS — Z23 Encounter for immunization: Secondary | ICD-10-CM

## 2021-08-10 MED ORDER — PFIZER COVID-19 VAC BIVALENT 30 MCG/0.3ML IM SUSP
INTRAMUSCULAR | 0 refills | Status: DC
  Filled 2021-08-10: qty 0.3, 1d supply, fill #0

## 2021-08-10 NOTE — Progress Notes (Signed)
   Covid-19 Vaccination Clinic  Name:  [name]    MRN: [id number] DOB: November 06, 1940  08/10/2021  [name] was observed post Covid-19 immunization for 15 minutes without incident. She was provided with Vaccine Information Sheet and instruction to access the V-Safe system.   [name] was instructed to call 911 with any severe reactions post vaccine: Difficulty breathing  Swelling of face and throat  A fast heartbeat  A bad rash all over body  Dizziness and weakness   Immunizations Administered     Name Date Dose VIS Date Route   Pfizer Covid-19 Vaccine Bivalent Booster 08/10/2021  1:28 PM 0.3 mL 05/03/2021 Intramuscular   Manufacturer: [place]   Lot: [id number]   [place]: [phone]

## 2021-08-17 ENCOUNTER — Ambulatory Visit
Admission: RE | Admit: 2021-08-17 | Discharge: 2021-08-17 | Disposition: A | Discharge status: Home / Self Care | Payer: Medicare Other | Source: Ambulatory Visit | Attending: Internal Medicine | Admitting: Internal Medicine

## 2021-08-17 ENCOUNTER — Other Ambulatory Visit: Payer: Self-pay

## 2021-08-17 DIAGNOSIS — N6321 Unspecified lump in the left breast, upper outer quadrant: Secondary | ICD-10-CM | POA: Diagnosis not present

## 2021-08-17 DIAGNOSIS — Z170 Estrogen receptor positive status [ER+]: Secondary | ICD-10-CM | POA: Diagnosis not present

## 2021-08-17 DIAGNOSIS — R928 Other abnormal and inconclusive findings on diagnostic imaging of breast: Secondary | ICD-10-CM

## 2021-08-17 DIAGNOSIS — C50412 Malignant neoplasm of upper-outer quadrant of left female breast: Secondary | ICD-10-CM | POA: Diagnosis not present

## 2021-08-18 ENCOUNTER — Ambulatory Visit: Payer: Medicare Other | Admitting: Internal Medicine

## 2021-08-23 ENCOUNTER — Ambulatory Visit: Payer: Medicare Other | Admitting: Internal Medicine

## 2021-08-24 ENCOUNTER — Encounter: Payer: Self-pay | Admitting: Internal Medicine

## 2021-08-24 ENCOUNTER — Ambulatory Visit (INDEPENDENT_AMBULATORY_CARE_PROVIDER_SITE_OTHER): Payer: Medicare Other | Admitting: Internal Medicine

## 2021-08-24 VITALS — BP 128/88 | HR 82 | Temp 97.6°F | Ht 63.0 in | Wt 173.7 lb

## 2021-08-24 DIAGNOSIS — G894 Chronic pain syndrome: Secondary | ICD-10-CM | POA: Diagnosis not present

## 2021-08-24 DIAGNOSIS — E1139 Type 2 diabetes mellitus with other diabetic ophthalmic complication: Secondary | ICD-10-CM

## 2021-08-24 DIAGNOSIS — R300 Dysuria: Secondary | ICD-10-CM | POA: Diagnosis not present

## 2021-08-24 DIAGNOSIS — I10 Essential (primary) hypertension: Secondary | ICD-10-CM | POA: Diagnosis not present

## 2021-08-24 DIAGNOSIS — Z79899 Other long term (current) drug therapy: Secondary | ICD-10-CM | POA: Diagnosis not present

## 2021-08-24 DIAGNOSIS — N3000 Acute cystitis without hematuria: Secondary | ICD-10-CM | POA: Diagnosis not present

## 2021-08-24 DIAGNOSIS — E785 Hyperlipidemia, unspecified: Secondary | ICD-10-CM | POA: Diagnosis not present

## 2021-08-24 LAB — POCT URINALYSIS DIPSTICK
Blood, UA: NEGATIVE
Glucose, UA: NEGATIVE
Leukocytes, UA: NEGATIVE
Nitrite, UA: POSITIVE
Protein, UA: POSITIVE — AB
Spec Grav, UA: 1.025 (ref 1.010–1.025)
Urobilinogen, UA: 0.2 E.U./dL
pH, UA: 5 (ref 5.0–8.0)

## 2021-08-24 LAB — URINALYSIS, ROUTINE W REFLEX MICROSCOPIC
Bilirubin Urine: NEGATIVE
Hgb urine dipstick: NEGATIVE
Ketones, ur: 15 — AB
Leukocytes,Ua: NEGATIVE
Nitrite: POSITIVE — AB
Specific Gravity, Urine: >=1.03 — AB (ref 1.000–1.030)
Total Protein, Urine: NEGATIVE
Urine Glucose: NEGATIVE
Urobilinogen, UA: 0.2 (ref 0.0–1.0)
pH: 5.5 (ref 5.0–8.0)

## 2021-08-24 LAB — POCT GLYCOSYLATED HEMOGLOBIN (HGB A1C): Hemoglobin A1C: 6.2 % — AB (ref 4.0–5.6)

## 2021-08-24 MED ORDER — HYDROCODONE-ACETAMINOPHEN 5-325 MG PO TABS: 1.0000 | ORAL_TABLET | Freq: Three times a day (TID) | ORAL | 0 refills | Status: DC | PRN

## 2021-08-24 MED ORDER — CIPROFLOXACIN HCL 500 MG PO TABS: 500.0000 mg | ORAL_TABLET | Freq: Two times a day (BID) | ORAL | 0 refills | Status: AC

## 2021-08-24 NOTE — Progress Notes (Signed)
Established Patient Office Visit     This visit occurred during the SARS-CoV-2 public health emergency.  Safety protocols were in place, including screening questions prior to the visit, additional usage of staff PPE, and extensive cleaning of exam room while observing appropriate contact time as indicated for disinfecting solutions.    CC/Reason for Visit: Follow-up chronic condition, possible UTI  HPI: [name] is a 80 y.o. female who is coming in today for the above mentioned reasons. Past Medical History is significant for: Hypertension, obstructive sleep apnea who is intolerant of CPAP, GERD, overactive bladder, hyperlipidemia, depression/anxiety for which she takes alprazolam 0.25 mg twice daily as needed, chronic pain syndrome who has a pain contract with me for hydrocodone 1 tablet 3 times a day, type 2 diabetes.  After screening mammogram she was diagnosed with left breast cancer, she is scheduled to see the surgeon next week for definitive management.  She thinks she might have a UTI.  She has been having dysuria and a bright yellow color of her urine for about 2 months now.  She does not have suprapubic pain or fever, no nausea or vomiting.  She is due for refills of her hydrocodone per contract.  She is also requesting refills of other medications.   Past Medical/Surgical History: Past Medical History:  Diagnosis Date   Allergy    Anxiety    Arthritis    PAIN AND OA RIGHT KNEE; PAIN AND DDD AND SPINAL STENOSIS   Complication of anesthesia    PULLED OUT MY IV'S AND FOLEY - HALLUCINATIONS WITH HIATAL HERNIA SURGERY   Depression    Diverticulosis    PER COLONOSCOPY   GERD (gastroesophageal reflux disease)    OVER THE COUNTER MEDS IF NEEDED   Heart murmur    "VERY SLIGHT ALL MY LIFE"   Hyperlipidemia    Hypertension    Sleep apnea    does not tolerate CPAP - HAS NOT USED IN OVER 2 YRS    Past Surgical History:  Procedure Laterality Date   COLONOSCOPY      HIATAL HERNIA REPAIR  2008   LUMBAR FUSION     LUMBAR LAMINECTOMY     PARTIAL KNEE ARTHROPLASTY Right 07/02/2014   Procedure: RIGHT MEDIAL UNICOMPARTMENTAL KNEE;  Surgeon: Mauri Pole, MD;  Location: [facility];  Service: Orthopedics;  Laterality: Right;   UPPER GASTROINTESTINAL ENDOSCOPY      Social History:  reports that she has never smoked. She has never used smokeless tobacco. She reports that she does not drink alcohol and does not use drugs.  Allergies: Allergies  Allergen Reactions   Ambien [Zolpidem Tartrate]     amnesia   Hydrochlorothiazide     Other reaction(s): Lethargy (intolerance)   Prednisone     Rapid heart rate   Thiazide-Type Diuretics Other (See Comments)    "could not physically move"   Sulfamethoxazole Rash    Family History:  Family History  Problem Relation Age of Onset   Heart disease Mother    Breast cancer Mother    Heart disease Father    Heart disease Sister    Pulmonary embolism Sister    Diabetes Paternal Grandfather      Current Outpatient Medications:    Accu-Chek FastClix Lancets MISC, 1 each by Does not apply route daily., Disp: 100 each, Rfl: 12   acetaminophen (TYLENOL) 650 MG CR tablet, Take 650 mg by mouth every 8 (eight) hours as needed for pain., Disp: ,  Rfl:    ALPRAZolam (XANAX) 0.25 MG tablet, TAKE ONE TABLET BY MOUTH TWICE DAILY AS NEEDED FOR ANXIETY, Disp: 60 tablet, Rfl: 0   amLODipine (NORVASC) 5 MG tablet, Take 1 tablet by mouth daily., Disp: 90 tablet, Rfl: 1   Blood Glucose Monitoring Suppl (ACCU-CHEK GUIDE ME) w/Device KIT, 1 each by Does not apply route daily., Disp: 1 kit, Rfl: 2   calcium & magnesium carbonates (MYLANTA) 311-232 MG tablet, Take 1 tablet by mouth daily., Disp: , Rfl:    ciprofloxacin (CIPRO) 500 MG tablet, Take 1 tablet (500 mg total) by mouth 2 (two) times daily for 5 days., Disp: 10 tablet, Rfl: 0   COVID-19 mRNA bivalent vaccine, Pfizer, (PFIZER COVID-19 VAC BIVALENT) injection, Inject into the  muscle., Disp: 0.3 mL, Rfl: 0   COVID-19 mRNA vaccine, Pfizer, 30 MCG/0.3ML injection, Inject into the muscle., Disp: 0.3 mL, Rfl: 0   famotidine (PEPCID) 20 MG tablet, Take 1 tablet (20 mg total) by mouth daily., Disp: 90 tablet, Rfl: 1   glucose blood (ACCU-CHEK GUIDE) test strip, 1 each by Other route daily. Use as instructed, Disp: 100 each, Rfl: 12   lisinopril (ZESTRIL) 5 MG tablet, TAKE ONE TABLET IN THE MORNING AND ONE TABLET AT BEDTIME., Disp: 180 tablet, Rfl: 0   meloxicam (MOBIC) 15 MG tablet, Take 1 tablet (15 mg total) by mouth daily., Disp: 90 tablet, Rfl: 0   mometasone (NASONEX) 50 MCG/ACT nasal spray, Place 2 sprays into the nose at bedtime., Disp: 17 g, Rfl: 6   oxybutynin (DITROPAN-XL) 10 MG 24 hr tablet, Take 1 tablet (10 mg total) by mouth daily., Disp: 90 tablet, Rfl: 1   polyvinyl alcohol (LIQUIFILM TEARS) 1.4 % ophthalmic solution, Place 1-2 drops into both eyes daily as needed for dry eyes., Disp: , Rfl:    simvastatin (ZOCOR) 20 MG tablet, Take 1 tablet (20 mg total) by mouth at bedtime., Disp: 90 tablet, Rfl: 1   triamcinolone cream (KENALOG) 0.1 %, APPLY TO RASH SPARINGLY ONCE A DAY., Disp: 30 g, Rfl: 0   HYDROcodone-acetaminophen (NORCO/VICODIN) 5-325 MG tablet, Take 1 tablet by mouth 3 (three) times daily as needed for moderate pain., Disp: 90 tablet, Rfl: 0   HYDROcodone-acetaminophen (NORCO/VICODIN) 5-325 MG tablet, Take 1 tablet by mouth 3 (three) times daily as needed for moderate pain., Disp: 90 tablet, Rfl: 0   HYDROcodone-acetaminophen (NORCO/VICODIN) 5-325 MG tablet, Take 1 tablet by mouth 3 (three) times daily as needed for moderate pain., Disp: 90 tablet, Rfl: 0  Review of Systems:  Constitutional: Denies fever, chills, diaphoresis, appetite change and fatigue.  HEENT: Denies photophobia, eye pain, redness, hearing loss, ear pain, congestion, sore throat, rhinorrhea, sneezing, mouth sores, trouble swallowing, neck pain, neck stiffness and tinnitus.    Respiratory: Denies SOB, DOE, cough, chest tightness,  and wheezing.   Cardiovascular: Denies chest pain, palpitations and leg swelling.  Gastrointestinal: Denies nausea, vomiting, abdominal pain, diarrhea, constipation, blood in stool and abdominal distention.  Genitourinary: Denies  urgency, frequency, hematuria, flank pain and difficulty urinating.  Endocrine: Denies: hot or cold intolerance, sweats, changes in hair or nails, polyuria, polydipsia. Musculoskeletal: Denies myalgias, back pain, joint swelling, arthralgias and gait problem.  Skin: Denies pallor, rash and wound.  Neurological: Denies dizziness, seizures, syncope, weakness, light-headedness, numbness and headaches.  Hematological: Denies adenopathy. Easy bruising, personal or family bleeding history  Psychiatric/Behavioral: Denies suicidal ideation, mood changes, confusion, nervousness, sleep disturbance and agitation    Physical Exam: Vitals:   08/24/21 1344  BP:  128/88  Pulse: 82  Temp: 97.6 F (36.4 C)  TempSrc: Oral  SpO2: 98%  Weight: 173 lb 11.2 oz (78.8 kg)  Height: _0  (1.6 m)    Body mass index is 30.77 kg/m.   Constitutional: NAD, calm, comfortable Eyes: PERRL, lids and conjunctivae normal, wears corrective lenses ENMT: Mucous membranes are moist.  Respiratory: clear to auscultation bilaterally, no wheezing, no crackles. Normal respiratory effort. No accessory muscle use.  Cardiovascular: Regular rate and rhythm, no murmurs / rubs / gallops. No extremity edema.  Neurologic: Grossly intact and nonfocal Psychiatric: Normal judgment and insight. Alert and oriented x 3. Normal mood.    Impression and Plan:  Acute cystitis without hematuria  - Plan: POCT urinalysis dipstick, Urine Culture, Urinalysis, Urinalysis, Urine Culture, ciprofloxacin (CIPRO) 500 MG tablet -In office dipstick is positive for nitrates, will send prescription for Cipro to take for 5 days, sent for formal UA and  culture.  Chronic pain syndrome  High risk medication use -PDMP reviewed, no red flags, overdose risk score is 60. -Refill hydrocodone 5/325 mg to take 1 tablet every 8 hours as needed for pain for total of 90 tablets a month x3 months.  Type 2 diabetes mellitus with other ophthalmic complication, without long-term current use of insulin (HCC)  - Plan: POCT glycosylated hemoglobin (Hb A1C) -A1c today demonstrates good control at 6.2.  Essential hypertension -Blood pressures currently well controlled.  Hyperlipidemia, unspecified hyperlipidemia type -Last lipid panel in June 2022 with a total cholesterol of 137, triglycerides 151 and LDL 57. -She is on simvastatin 20 mg daily.  Time spent: 33 minutes reviewing chart, interviewing and examining patient and formulating plan of care.     Lelon Frohlich, MD [facility] at [facility]

## 2021-08-26 LAB — URINE CULTURE
MICRO NUMBER:: 12790930
SPECIMEN QUALITY:: ADEQUATE

## 2021-08-29 ENCOUNTER — Other Ambulatory Visit: Payer: Self-pay | Admitting: Surgery

## 2021-08-29 DIAGNOSIS — Z170 Estrogen receptor positive status [ER+]: Secondary | ICD-10-CM | POA: Diagnosis not present

## 2021-08-29 DIAGNOSIS — C50412 Malignant neoplasm of upper-outer quadrant of left female breast: Secondary | ICD-10-CM | POA: Diagnosis not present

## 2021-08-29 DIAGNOSIS — Z853 Personal history of malignant neoplasm of breast: Secondary | ICD-10-CM

## 2021-08-30 ENCOUNTER — Other Ambulatory Visit: Payer: Self-pay | Admitting: Surgery

## 2021-08-30 ENCOUNTER — Other Ambulatory Visit: Payer: Self-pay | Admitting: *Deleted

## 2021-08-30 DIAGNOSIS — Z853 Personal history of malignant neoplasm of breast: Secondary | ICD-10-CM

## 2021-08-30 DIAGNOSIS — C50919 Malignant neoplasm of unspecified site of unspecified female breast: Secondary | ICD-10-CM

## 2021-08-31 ENCOUNTER — Other Ambulatory Visit: Payer: Self-pay | Admitting: Internal Medicine

## 2021-08-31 DIAGNOSIS — F411 Generalized anxiety disorder: Secondary | ICD-10-CM

## 2021-09-01 NOTE — Telephone Encounter (Signed)
-   last filled 08/02/21 - next office visit scheduled for 11/22/21

## 2021-09-05 ENCOUNTER — Telehealth: Payer: Self-pay | Admitting: Hematology

## 2021-09-05 NOTE — Telephone Encounter (Signed)
Scheduled appt per 12/28 staff msg from nurse navigator. Pt is aware of appt date and time.

## 2021-09-18 ENCOUNTER — Inpatient Hospital Stay: Payer: Medicare Other | Attending: Hematology

## 2021-09-18 ENCOUNTER — Other Ambulatory Visit: Payer: Self-pay

## 2021-09-18 ENCOUNTER — Encounter: Payer: Self-pay | Admitting: Hematology

## 2021-09-18 ENCOUNTER — Inpatient Hospital Stay (HOSPITAL_BASED_OUTPATIENT_CLINIC_OR_DEPARTMENT_OTHER): Payer: Medicare Other | Admitting: Hematology

## 2021-09-18 VITALS — HR 71 | Temp 98.4°F | Resp 19 | Ht 63.0 in | Wt 174.2 lb

## 2021-09-18 DIAGNOSIS — G894 Chronic pain syndrome: Secondary | ICD-10-CM | POA: Diagnosis not present

## 2021-09-18 DIAGNOSIS — Z836 Family history of other diseases of the respiratory system: Secondary | ICD-10-CM | POA: Insufficient documentation

## 2021-09-18 DIAGNOSIS — E785 Hyperlipidemia, unspecified: Secondary | ICD-10-CM | POA: Insufficient documentation

## 2021-09-18 DIAGNOSIS — I10 Essential (primary) hypertension: Secondary | ICD-10-CM | POA: Insufficient documentation

## 2021-09-18 DIAGNOSIS — Z79899 Other long term (current) drug therapy: Secondary | ICD-10-CM | POA: Diagnosis not present

## 2021-09-18 DIAGNOSIS — Z833 Family history of diabetes mellitus: Secondary | ICD-10-CM | POA: Insufficient documentation

## 2021-09-18 DIAGNOSIS — Z803 Family history of malignant neoplasm of breast: Secondary | ICD-10-CM | POA: Insufficient documentation

## 2021-09-18 DIAGNOSIS — Z8249 Family history of ischemic heart disease and other diseases of the circulatory system: Secondary | ICD-10-CM | POA: Diagnosis not present

## 2021-09-18 DIAGNOSIS — Z170 Estrogen receptor positive status [ER+]: Secondary | ICD-10-CM | POA: Insufficient documentation

## 2021-09-18 DIAGNOSIS — F419 Anxiety disorder, unspecified: Secondary | ICD-10-CM

## 2021-09-18 DIAGNOSIS — C50412 Malignant neoplasm of upper-outer quadrant of left female breast: Secondary | ICD-10-CM | POA: Diagnosis not present

## 2021-09-18 DIAGNOSIS — Z882 Allergy status to sulfonamides status: Secondary | ICD-10-CM

## 2021-09-18 DIAGNOSIS — E119 Type 2 diabetes mellitus without complications: Secondary | ICD-10-CM | POA: Insufficient documentation

## 2021-09-18 DIAGNOSIS — Z7182 Exercise counseling: Secondary | ICD-10-CM | POA: Diagnosis not present

## 2021-09-18 DIAGNOSIS — Z888 Allergy status to other drugs, medicaments and biological substances status: Secondary | ICD-10-CM | POA: Insufficient documentation

## 2021-09-18 DIAGNOSIS — K5900 Constipation, unspecified: Secondary | ICD-10-CM

## 2021-09-18 LAB — CBC WITH DIFFERENTIAL/PLATELET
Abs Immature Granulocytes: 0.04 10*3/uL (ref 0.00–0.07)
Basophils Absolute: 0.1 10*3/uL (ref 0.0–0.1)
Basophils Relative: 1 %
Eosinophils Absolute: 0.2 10*3/uL (ref 0.0–0.5)
Eosinophils Relative: 2 %
HCT: 40 % (ref 36.0–46.0)
Hemoglobin: 13.7 g/dL (ref 12.0–15.0)
Immature Granulocytes: 0 %
Lymphocytes Relative: 33 %
Lymphs Abs: 3.1 10*3/uL (ref 0.7–4.0)
MCH: 32.9 pg (ref 26.0–34.0)
MCHC: 34.3 g/dL (ref 30.0–36.0)
MCV: 95.9 fL (ref 80.0–100.0)
Monocytes Absolute: 0.6 10*3/uL (ref 0.1–1.0)
Monocytes Relative: 6 %
Neutro Abs: 5.4 10*3/uL (ref 1.7–7.7)
Neutrophils Relative %: 58 %
Platelets: 288 10*3/uL (ref 150–400)
RBC: 4.17 MIL/uL (ref 3.87–5.11)
RDW: 11.8 % (ref 11.5–15.5)
WBC: 9.3 10*3/uL (ref 4.0–10.5)
nRBC: 0 % (ref 0.0–0.2)

## 2021-09-18 LAB — COMPREHENSIVE METABOLIC PANEL
ALT: 10 U/L (ref 0–44)
AST: 15 U/L (ref 15–41)
Albumin: 4.4 g/dL (ref 3.5–5.0)
Alkaline Phosphatase: 44 U/L (ref 38–126)
Anion gap: 9 (ref 5–15)
BUN: 17 mg/dL (ref 8–23)
CO2: 25 mmol/L (ref 22–32)
Calcium: 9.8 mg/dL (ref 8.9–10.3)
Chloride: 105 mmol/L (ref 98–111)
Creatinine, Ser: 0.7 mg/dL (ref 0.44–1.00)
GFR, Estimated: >60 mL/min (ref >60)
Glucose, Bld: 117 mg/dL — ABNORMAL HIGH (ref 70–99)
Potassium: 3.9 mmol/L (ref 3.5–5.1)
Sodium: 139 mmol/L (ref 135–145)
Total Bilirubin: 0.5 mg/dL (ref 0.3–1.2)
Total Protein: 7.7 g/dL (ref 6.5–8.1)

## 2021-09-18 NOTE — Progress Notes (Signed)
Radiation Oncology         [phone] ________________________________  Initial Outpatient Consultation  Name: [name] MRN: [id number]  Date: 09/19/2021  DOB: 1941-04-30  [id number] Everardo Beals, MD  Coralie Keens, MD   REFERRING PHYSICIAN: Coralie Keens, MD  DIAGNOSIS:    ICD-10-CM   1. Malignant neoplasm of upper-outer quadrant of left breast in female, estrogen receptor positive ([place])  C50.412    Z17.0      Stage IA (cT1c, cN0, cM0) Left Breast UOQ, Invasive Mammary Carcinoma, ER+ / PR+ / Her2-, Grade 1/2  CHIEF COMPLAINT: Here to discuss management of left breast cancer  HISTORY OF PRESENT ILLNESS::[name] is a 81 y.o. female who presented with a left breast abnormality on the following imaging: bilateral screening mammogram on the date of 07/11/21.  No symptoms, if any, were reported at that time.   Diagnostic left breast mammogram and ultrasound on 08/09/21 further revealed a suspicious mass in the left breast, 1 o'clock position. No evidence of left axillary adenopathy was appreciated.     Biopsy of the left breast, 1 o'clock position, on the date of 08/17/21 showed grade 1/2 invasive mammary carcinoma measuring 1.1 cm in the greatest tumor dimension.  ER status: 95% positive; PR status 100% positive (both with strong staining intensity); Her2 status negative; Proliferation marker Ki67 at 5%; Grade 1/2. No lymph nodes were examined.   Subsequently, the patient was referred to Dr. Ninfa Linden to discuss surgical options. Following discussion of the risks and benefits, the patient agreed to proceed with left breast lumpectomy, scheduled for 09/26/21.  Of note: the patient's family history is significant for breast cancer in her mother  Lymphedema issues, if any:  Patient denies    Pain issues, if any:  Patient denies   SAFETY ISSUES: Prior radiation? No Pacemaker/ICD? No Possible current pregnancy? No--postmenopausal  Is the patient on  methotrexate? No  Current Complaints / other details:  Nothing else of note     PREVIOUS RADIATION THERAPY: No  PAST MEDICAL HISTORY:  has a past medical history of Allergy, Anxiety, Arthritis, Complication of anesthesia, Depression, Diverticulosis, GERD (gastroesophageal reflux disease), Heart murmur, Hyperlipidemia, Hypertension, and Sleep apnea.    PAST SURGICAL HISTORY: Past Surgical History:  Procedure Laterality Date   COLONOSCOPY     HIATAL HERNIA REPAIR  2008   LUMBAR FUSION     LUMBAR LAMINECTOMY     PARTIAL KNEE ARTHROPLASTY Right 07/02/2014   Procedure: RIGHT MEDIAL UNICOMPARTMENTAL KNEE;  Surgeon: Mauri Pole, MD;  Location: [facility];  Service: Orthopedics;  Laterality: Right;   UPPER GASTROINTESTINAL ENDOSCOPY      FAMILY HISTORY: family history includes Breast cancer in her mother; Diabetes in her paternal grandfather; Heart disease in her father, mother, and sister; Pulmonary embolism in her sister.  SOCIAL HISTORY:  reports that she has never smoked. She has never used smokeless tobacco. She reports that she does not drink alcohol and does not use drugs.  ALLERGIES: Ambien [zolpidem tartrate], Hydrochlorothiazide, Prednisone, Thiazide-type diuretics, and Sulfamethoxazole  MEDICATIONS:  Current Outpatient Medications  Medication Sig Dispense Refill   Accu-Chek FastClix Lancets MISC 1 each by Does not apply route daily. 100 each 12   ALPRAZolam (XANAX) 0.25 MG tablet TAKE ONE TABLET BY MOUTH TWICE DAILY AS NEEDED FOR ANXIETY (Patient taking differently: Take 0.5 mg by mouth at bedtime.) 60 tablet 0   amLODipine (NORVASC) 5 MG tablet Take 1 tablet by mouth daily. 90 tablet 1   Artificial  Tear Ointment (DRY EYES OP) Place 1 drop into both eyes at bedtime as needed (Dry eye).     Blood Glucose Monitoring Suppl (ACCU-CHEK GUIDE ME) w/Device KIT 1 each by Does not apply route daily. 1 kit 2   COVID-19 mRNA bivalent vaccine, Pfizer, (PFIZER COVID-19 VAC BIVALENT) injection  Inject into the muscle. 0.3 mL 0   COVID-19 mRNA vaccine, Pfizer, 30 MCG/0.3ML injection Inject into the muscle. 0.3 mL 0   diclofenac Sodium (VOLTAREN) 1 % GEL Apply 4 g topically 4 (four) times daily. Apply to bilateral knees.     diphenhydramine-acetaminophen (TYLENOL PM) 25-500 MG TABS tablet Take 2 tablets by mouth at bedtime.     fluocinonide (LIDEX) 0.05 % external solution Apply 1 application topically daily as needed for itching.     fluticasone (CUTIVATE) 0.005 % ointment Apply 1 application topically daily.     glucose blood (ACCU-CHEK GUIDE) test strip 1 each by Other route daily. Use as instructed 100 each 12   HYDROcodone-acetaminophen (NORCO/VICODIN) 5-325 MG tablet Take 1 tablet by mouth 3 (three) times daily as needed for moderate pain. 90 tablet 0   ketoconazole (NIZORAL) 2 % shampoo Apply 1 application topically 3 (three) times a week.     lisinopril (ZESTRIL) 5 MG tablet TAKE ONE TABLET IN THE MORNING AND ONE TABLET AT BEDTIME. 180 tablet 0   meloxicam (MOBIC) 15 MG tablet Take 1 tablet (15 mg total) by mouth daily. 90 tablet 0   mometasone (NASONEX) 50 MCG/ACT nasal spray Place 2 sprays into the nose at bedtime. 17 g 6   oxybutynin (DITROPAN-XL) 10 MG 24 hr tablet Take 1 tablet (10 mg total) by mouth daily. 90 tablet 1   raNITIdine HCl (ZANTAC PO) Take 4 mg by mouth at bedtime.     Sennosides (EX-LAX PO) Take 1 tablet by mouth at bedtime.     simvastatin (ZOCOR) 20 MG tablet Take 1 tablet (20 mg total) by mouth at bedtime. 90 tablet 1   triamcinolone (NASACORT) 55 MCG/ACT AERO nasal inhaler Place 2 sprays into the nose daily.     triamcinolone cream (KENALOG) 0.1 % APPLY TO RASH SPARINGLY ONCE A DAY. 30 g 0   No current facility-administered medications for this encounter.    REVIEW OF SYSTEMS: As above in HPI.   PHYSICAL EXAM:  height is $RemoveB'5\' 3"'PNhhatUb$  (1.6 m) and weight is 173 lb 6 oz (78.6 kg). Her temporal temperature is 96.8 F (36 C) (abnormal). Her blood pressure is 160/88  (abnormal) and her pulse is 80. Her respiration is 18 and oxygen saturation is 99%.   General: Alert and oriented, in no acute distress HEENT: Head is normocephalic.   Neck: Neck is supple, no palpable cervical or supraclavicular lymphadenopathy. Heart: Regular in rate and rhythm with no murmurs, rubs, or gallops. Chest: Clear to auscultation bilaterally, with no rhonchi, wheezes, or rales. Abdomen: Soft, nontender, nondistended, with no rigidity or guarding. Skin: No concerning lesions. Musculoskeletal: symmetric strength and muscle tone throughout. Neurologic: Cranial nerves II through XII are grossly intact. No obvious focalities. Speech is fluent. Coordination is intact. Psychiatric: Judgment and insight are intact. Affect is appropriate. Breasts: post biopsy changes in left breast appreciated with no other palpable masses appreciated in the breasts or axillae bilaterally .   ECOG = 1  0 - Asymptomatic (Fully active, able to carry on all predisease activities without restriction)  1 - Symptomatic but completely ambulatory (Restricted in physically strenuous activity but ambulatory and able to carry  out work of a light or sedentary nature. For example, light housework, office work)  2 - Symptomatic, <50% in bed during the day (Ambulatory and capable of all self care but unable to carry out any work activities. Up and about more than 50% of waking hours)  3 - Symptomatic, >50% in bed, but not bedbound (Capable of only limited self-care, confined to bed or chair 50% or more of waking hours)  4 - Bedbound (Completely disabled. Cannot carry on any self-care. Totally confined to bed or chair)  5 - Death   Eustace Pen MM, Creech RH, Tormey DC, et al. [phone]). "Toxicity and response criteria of the [facility]". [place] Oncol. 5 (6): 649-55   LABORATORY DATA:  Lab Results  Component Value Date   WBC 9.3 09/18/2021   HGB 13.7 09/18/2021   HCT 40.0 09/18/2021   MCV  95.9 09/18/2021   PLT 288 09/18/2021   CMP     Component Value Date/Time   NA 139 09/18/2021 1600   K 3.9 09/18/2021 1600   CL 105 09/18/2021 1600   CO2 25 09/18/2021 1600   GLUCOSE 117 (H) 09/18/2021 1600   BUN 17 09/18/2021 1600   CREATININE 0.70 09/18/2021 1600   CALCIUM 9.8 09/18/2021 1600   PROT 7.7 09/18/2021 1600   ALBUMIN 4.4 09/18/2021 1600   AST 15 09/18/2021 1600   ALT 10 09/18/2021 1600   ALKPHOS 44 09/18/2021 1600   BILITOT 0.5 09/18/2021 1600   GFRNONAA >60 09/18/2021 1600   GFRAA >90 07/03/2014 0400       RADIOGRAPHY:  as above    IMPRESSION/PLAN: Left breast cancer, ER+   I talked to her about the option of a mastectomy and informed her that her expected overall survival would be equivalent between mastectomy and breast conservation, based upon randomized controlled data. She is enthusiastic about breast conservation.  For the patient's early stage favorable risk breast cancer, we had a thorough discussion about her options for adjuvant therapy. One option would be antiestrogen therapy as discussed with medical oncology. She would take a pill for approximately 5 years. The alternative option (but less standard) would be radiotherapy to the breast. The most aggressive option would be to pursue both modalities.  Of note, I discussed the data from the [employer] al trial in the [place] [employer]. She understands that tamoxifen compared to radiation plus tamoxifen demonstrated no survival benefit among the women in this study. The women were 107 years or older with stage I estrogen receptor positive breast cancer. Based on this study, I told the patient that her overall life expectancy should not be affected by adding radiotherapy to antiestrogen medication. She understands that the main benefit of  adding radiotherapy to anti estrogen therapy would be a very small but measurable local control benefit (risk of local recurrence to be lowered from ~9% --> ~2%  over a decade).  We discussed the fact that radiotherapy only provides a local control benefit while anti-estrogen pills provide systemic coverage. That being said, the risk of systemic failure is relatively low with her type of breast cancer.  We discussed the risks benefits and side effects of radiotherapy. She understands that the side effects would likely include some skin irritation and fatigue during the weeks of radiation. There is a risk of late effects which include but are not necessarily limited to cosmetic changes and rare lung toxicity. I would anticipate delivering approximately 1-4 weeks of radiotherapy (she  is interested in the [place] 1 week regimen).  After a thorough discussion, the is leaning towards  1 week of ultra hypofractionated radiation therapy, understanding this is generally appearing to be as safe and effective as 3-4 week regimens based on several years of follow-up in [place] trials. She realizes this approach is a bit less standard than longer regimens but certainly more convenient. We spoke about acute effects including skin irritation and fatigue as well as breast fibrosis long term, and much less common late effects including internal organ injury or irritation. We spoke about the latest technology that is used to minimize the risk of late effects for patients undergoing radiotherapy to the breast or chest wall. No guarantees of treatment were given. The patient is enthusiastic about proceeding with treatment. Consent form signed today. I look forward to participating in the patient's care.  I will await her referral back to me for postoperative follow-up and eventual CT simulation/treatment planning.  On date of service, in total, I spent 35 minutes on this encounter. Patient was seen in person.   __________________________________________   Eppie Gibson, MD  This document serves as a record of services personally performed by Eppie Gibson, MD. It was created on  her behalf by Roney Mans, a trained medical scribe. The creation of this record is based on the scribe's personal observations and the provider's statements to them. This document has been checked and approved by the attending provider.

## 2021-09-18 NOTE — Progress Notes (Addendum)
**Note Felicia via Obfuscation** [place]   [phone] Fax:([phone] [place] Note   Patient Care Team: Isaac Bliss, Rayford Halsted, MD as PCP - General (Internal Medicine) Coralie Keens, MD as Consulting Physician (General Surgery) Truitt Merle, MD as Consulting Physician (Hematology) Eppie Gibson, MD as Attending Physician (Radiation Oncology) Rutherford Guys, MD as Consulting Physician (Ophthalmology)  Date of Service:  09/18/2021   CHIEF COMPLAINTS/PURPOSE OF CONSULTATION:  Left Breast Cancer, ER+  REFERRING PHYSICIAN:  Dr. Ninfa Linden   ASSESSMENT & PLAN:  [name] is a 81 y.o. postmenopausal female with a history of arthritis, HTN  1. Malignant neoplasm of upper-outer quadrant of left breast, invasive lobular carcinoma, stage IA, c(T1c, N0), ER+/PR+/HER2-, Grade 2  -found on screening mammogram. Left diagnostic MM and [place] 08/09/21 showed 8 mm mass at 1 o'clock. Biopsy on 08/17/21 showed invasive lobular carcinoma, 1.1cm, grade 1/2. --We discussed her imaging findings and the biopsy results in great details. -Given the early stage disease, she is scheduled for left lumpectomy on 09/26/21 under Dr. Ninfa Linden.  Due to her advanced age and early stage disease, sentinel lymph node biopsy will likely not performed -Given her advanced age, she is not a candidate for chemotherapy, I do not recommend Oncotype. -Giving the strong ER and PR expression in her postmenopausal status, I recommend adjuvant endocrine therapy with aromatase inhibitor or Tamoxifen for a total of 5-10 years to reduce the risk of cancer recurrence. Potential benefits and side effects were discussed with patient and she is interested. Due to her moderate arthralgia and osteoporosis, tamoxifen is likely a better option for her. -She will also see radiation oncologist Dr. Isidore Moos tomorrow. She will likely benefit from breast radiation if she undergo lumpectomy to decrease the risk of breast cancer.  -we  will obtain baseline labs today. -We also discussed the breast cancer surveillance after her surgery. She will continue annual screening mammogram, self exam, and a routine office visit with lab and exam with [place]. -I encouraged her to have healthy diet and exercise regularly.   2. Bone Health  -Her most recent DEXA was 09/10/18 showing osteoporosis (T-score -4.0) at distal radius -she notes she has not received Prolia due to high cost.  I recommend her to consider Zometa, which also increased risk of bone metastasis from breast cancer.  Potential benefit and side effects discussed with her, she is interested. -I recommend obtaining a repeat DEXA in the near future.  3. Chronic Pain Syndrome -managed by her PCP, Dr. Isaac Bliss -she takes hydrocodone 5/325 mg q8hr   PLAN:  -proceed to lab today -appointment with Dr. Isidore Moos tomorrow, 1/17 -left lumpectomy on 1/24 with Dr. Ninfa Linden -I will see her back after surgery or after radiation to finalize her antiestrogen therapy plan. I would recommend Tamoxifen    Oncology History Overview Note   Cancer Staging  Malignant neoplasm of upper-outer quadrant of left breast in female, estrogen receptor positive ([place]) Staging form: Breast, AJCC 8th Edition - Clinical stage from 08/17/2021: Stage IA (cT1c, cN0, cM0, G2, ER+, PR+, HER2-) - Signed by Truitt Merle, MD on 09/18/2021    Malignant neoplasm of upper-outer quadrant of left breast in female, estrogen receptor positive ([place])  08/09/2021 Mammogram   EXAM: DIGITAL DIAGNOSTIC UNILATERAL LEFT MAMMOGRAM WITH TOMOSYNTHESIS AND CAD; ULTRASOUND LEFT BREAST LIMITED  IMPRESSION: 1.  There is a suspicious mass in the left breast at 1 o'clock.   2.  No evidence of left axillary lymphadenopathy.   08/17/2021 Cancer  Staging   Staging form: Breast, AJCC 8th Edition - Clinical stage from 08/17/2021: Stage IA (cT1c, cN0, cM0, G2, ER+, PR+, HER2-) - Signed by Truitt Merle, MD on 09/18/2021 Stage prefix:  Initial diagnosis Histologic grading system: 3 grade system    08/17/2021 Initial Biopsy   Diagnosis Breast, left, needle core biopsy, left breast 1:00, same - INVASIVE MAMMARY CARCINOMA, GRADE 1/2. - SEE MICROSCOPIC DESCRIPTION. Microscopic Comment The greatest tumor dimension is 1.1 cm .  Addendum: Immunohistochemistry for E-cadherin is negative consistent with lobular carcinoma.  PROGNOSTIC INDICATORS Results: The tumor cells are EQUIVOCAL for Her2 (2+). Her2 by FISH will be performed and results reported separately. Estrogen Receptor: 95%, POSITIVE, STRONG STAINING INTENSITY Progesterone Receptor: 100%, POSITIVE, STRONG STAINING INTENSITY Proliferation Marker Ki67: 5%  FLUORESCENCE IN-SITU HYBRIDIZATION Results: GROUP 5: HER2 **NEGATIVE**   09/18/2021 Initial Diagnosis   Malignant neoplasm of upper-outer quadrant of left breast in female, estrogen receptor positive ([place])      HISTORY OF PRESENTING ILLNESS:  [name] 81 y.o. female is a here because of breast cancer. The patient was referred by Dr. Ninfa Linden. The patient presents to the clinic today accompanied by her husband.   She had routine screening mammography on 07/11/21 showing a possible abnormality in the left breast. She underwent left diagnostic mammography and left breast ultrasonography on 08/09/21 showing: suspicious 8 mm mass at 1 o'clock.  Biopsy on 08/17/21 showed: invasive mammary carcinoma, e-cadherin negative, grade 1/2. Prognostic indicators significant for: estrogen receptor, 95% positive and progesterone receptor, 100% positive. Proliferation marker Ki67 at 5%. HER2 negative by FISH.    Today the patient notes they felt/feeling prior/after... -she reports she did not feel the mass herself -she denies any breast changes   She has a PMHx of.... -incontinence, constipation, difficulty concentrating -arthritis, s/p 2 back surgeries and a partial knee replacement -HTN, DM -chronic  pain  Socially... -she is married with two children, both of which live in [place] -she is retired from working at a bank -she reports breast cancer in her mother in her 20-60's.  GYN HISTORY  Menarchal: 81 years old LMP: age unsure "it's been a long time" Contraceptive: HRT: used for many years, unsure length, maybe 10 years, for hot flashes GP: 2, first at age 65   REVIEW OF SYSTEMS:    Constitutional: Denies fevers, chills or abnormal night sweats Eyes: Denies blurriness of vision, double vision or watery eyes Ears, nose, mouth, throat, and face: Denies mucositis or sore throat Respiratory: Denies cough, dyspnea or wheezes Cardiovascular: Denies palpitation, chest discomfort or lower extremity swelling Gastrointestinal:  Denies nausea, heartburn or change in bowel habits Skin: Denies abnormal skin rashes Lymphatics: Denies new lymphadenopathy or easy bruising Neurological:Denies numbness, tingling or new weaknesses Behavioral/Psych: Mood is stable, no new changes  All other systems were reviewed with the patient and are negative.   MEDICAL HISTORY:  Past Medical History:  Diagnosis Date   Allergy    Anxiety    Arthritis    PAIN AND OA RIGHT KNEE; PAIN AND DDD AND SPINAL STENOSIS   Complication of anesthesia    PULLED OUT MY IV'S AND FOLEY - HALLUCINATIONS WITH HIATAL HERNIA SURGERY   Depression    Diverticulosis    PER COLONOSCOPY   GERD (gastroesophageal reflux disease)    OVER THE COUNTER MEDS IF NEEDED   Heart murmur    "VERY SLIGHT ALL MY LIFE"   Hyperlipidemia    Hypertension    Sleep apnea    does  not tolerate CPAP - HAS NOT USED IN OVER 2 YRS    SURGICAL HISTORY: Past Surgical History:  Procedure Laterality Date   COLONOSCOPY     HIATAL HERNIA REPAIR  2008   LUMBAR FUSION     LUMBAR LAMINECTOMY     PARTIAL KNEE ARTHROPLASTY Right 07/02/2014   Procedure: RIGHT MEDIAL UNICOMPARTMENTAL KNEE;  Surgeon: Mauri Pole, MD;  Location: [facility];   Service: Orthopedics;  Laterality: Right;   UPPER GASTROINTESTINAL ENDOSCOPY      SOCIAL HISTORY: Social History   Socioeconomic History   Marital status: Married    Spouse name: Not on file   Number of children: 2   Years of education: Not on file   Highest education level: Not on file  Occupational History   Not on file  Tobacco Use   Smoking status: Never   Smokeless tobacco: Never  Substance and Sexual Activity   Alcohol use: No   Drug use: No   Sexual activity: Yes  Other Topics Concern   Not on file  Social History Narrative   Not on file   Social Determinants of Health   Financial Resource Strain: Not on file  Food Insecurity: Not on file  Transportation Needs: Not on file  Physical Activity: Not on file  Stress: Not on file  Social Connections: Not on file  Intimate Partner Violence: Not on file    FAMILY HISTORY: Family History  Problem Relation Age of Onset   Heart disease Mother    Breast cancer Mother    Heart disease Father    Heart disease Sister    Pulmonary embolism Sister    Diabetes Paternal Grandfather     ALLERGIES:  is allergic to [employer] tartrate], hydrochlorothiazide, prednisone, thiazide-type diuretics, and sulfamethoxazole.  MEDICATIONS:  Current Outpatient Medications  Medication Sig Dispense Refill   Accu-Chek FastClix Lancets MISC 1 each by Does not apply route daily. 100 each 12   ALPRAZolam (XANAX) 0.25 MG tablet TAKE ONE TABLET BY MOUTH TWICE DAILY AS NEEDED FOR ANXIETY (Patient taking differently: Take 0.5 mg by mouth at bedtime.) 60 tablet 0   amLODipine (NORVASC) 5 MG tablet Take 1 tablet by mouth daily. 90 tablet 1   Artificial Tear Ointment (DRY EYES OP) Place 1 drop into both eyes at bedtime as needed (Dry eye).     Blood Glucose Monitoring Suppl (ACCU-CHEK GUIDE ME) w/Device KIT 1 each by Does not apply route daily. 1 kit 2   COVID-19 mRNA bivalent vaccine, Pfizer, (PFIZER COVID-19 VAC BIVALENT) injection Inject  into the muscle. 0.3 mL 0   COVID-19 mRNA vaccine, Pfizer, 30 MCG/0.3ML injection Inject into the muscle. 0.3 mL 0   diclofenac Sodium (VOLTAREN) 1 % GEL Apply 4 g topically 4 (four) times daily. Apply to bilateral knees.     diphenhydramine-acetaminophen (TYLENOL PM) 25-500 MG TABS tablet Take 2 tablets by mouth at bedtime.     fluocinonide (LIDEX) 0.05 % external solution Apply 1 application topically daily as needed for itching.     fluticasone (CUTIVATE) 0.005 % ointment Apply 1 application topically daily.     glucose blood (ACCU-CHEK GUIDE) test strip 1 each by Other route daily. Use as instructed 100 each 12   HYDROcodone-acetaminophen (NORCO/VICODIN) 5-325 MG tablet Take 1 tablet by mouth 3 (three) times daily as needed for moderate pain. 90 tablet 0   ketoconazole (NIZORAL) 2 % shampoo Apply 1 application topically 3 (three) times a week.     lisinopril (ZESTRIL)  5 MG tablet TAKE ONE TABLET IN THE MORNING AND ONE TABLET AT BEDTIME. 180 tablet 0   meloxicam (MOBIC) 15 MG tablet Take 1 tablet (15 mg total) by mouth daily. 90 tablet 0   mometasone (NASONEX) 50 MCG/ACT nasal spray Place 2 sprays into the nose at bedtime. 17 g 6   oxybutynin (DITROPAN-XL) 10 MG 24 hr tablet Take 1 tablet (10 mg total) by mouth daily. 90 tablet 1   raNITIdine HCl (ZANTAC PO) Take 4 mg by mouth at bedtime.     Sennosides (EX-LAX PO) Take 1 tablet by mouth at bedtime.     simvastatin (ZOCOR) 20 MG tablet Take 1 tablet (20 mg total) by mouth at bedtime. 90 tablet 1   triamcinolone (NASACORT) 55 MCG/ACT AERO nasal inhaler Place 2 sprays into the nose daily.     triamcinolone cream (KENALOG) 0.1 % APPLY TO RASH SPARINGLY ONCE A DAY. 30 g 0   No current facility-administered medications for this visit.    PHYSICAL EXAMINATION: ECOG PERFORMANCE STATUS: 1 - Symptomatic but completely ambulatory  Vitals:   09/18/21 1513  Pulse: 71  Resp: 19  Temp: 98.4 F (36.9 C)  SpO2: 99%   Filed Weights   09/18/21  1513  Weight: 174 lb 3.2 oz (79 kg)    GENERAL:alert, no distress and comfortable SKIN: skin color, texture, turgor are normal, no rashes or significant lesions EYES: normal, Conjunctiva are pink and non-injected, sclera clear  NECK: supple, thyroid normal size, non-tender, without nodularity LYMPH:  no palpable lymphadenopathy in the cervical, axillary  LUNGS: clear to auscultation and percussion with normal breathing effort HEART: regular rate & rhythm and no murmurs and no lower extremity edema ABDOMEN:abdomen soft, non-tender and normal bowel sounds Musculoskeletal:no cyanosis of digits and no clubbing  NEURO: alert & oriented x 3 with fluent speech, no focal motor/sensory deficits BREAST: No palpable mass, nodules or adenopathy bilaterally. Breast exam benign.  LABORATORY DATA:  I have reviewed the data as listed CBC Latest Ref Rng & Units 09/18/2021 02/16/2021 12/11/2019  WBC 4.0 - 10.5 K/uL 9.3 5.8 6.3  Hemoglobin 12.0 - 15.0 g/dL 13.7 13.6 13.7  Hematocrit 36.0 - 46.0 % 40.0 40.3 40.4  Platelets 150 - 400 K/uL 288 231.0 272.0    CMP Latest Ref Rng & Units 09/18/2021 02/16/2021 12/11/2019  Glucose 70 - 99 mg/dL 117(H) 110(H) 125(H)  BUN 8 - 23 mg/dL '17 17 19  ' Creatinine 0.44 - 1.00 mg/dL 0.70 0.78 0.61  Sodium 135 - 145 mmol/L 139 137 140  Potassium 3.5 - 5.1 mmol/L 3.9 3.8 4.3  Chloride 98 - 111 mmol/L 105 102 102  CO2 22 - 32 mmol/L '25 25 27  ' Calcium 8.9 - 10.3 mg/dL 9.8 9.6 9.5  Total Protein 6.5 - 8.1 g/dL 7.7 7.3 6.6  Total Bilirubin 0.3 - 1.2 mg/dL 0.5 0.7 0.6  Alkaline Phos 38 - 126 U/L 44 36(L) 43  AST 15 - 41 U/L '15 11 16  ' ALT 0 - 44 U/L '10 7 13     ' RADIOGRAPHIC STUDIES: I have personally reviewed the radiological images as listed and agreed with the findings in the report. No results found.   Orders Placed This Encounter  Procedures   CBC with Differential/Platelet    Standing Status:   Standing    Number of Occurrences:   5    Standing Expiration Date:    09/18/2022   Comprehensive metabolic panel    Standing Status:   Standing  Number of Occurrences:   5    Standing Expiration Date:   09/18/2022    All questions were answered. The patient knows to call the clinic with any problems, questions or concerns. The total time spent in the appointment was 45 minutes.     Truitt Merle, MD 09/18/2021 11:00 PM  I, Wilburn Mylar, am acting as scribe for Truitt Merle, MD.   I have reviewed the above documentation for accuracy and completeness, and I agree with the above.

## 2021-09-18 NOTE — Progress Notes (Signed)
Location of Breast Cancer:  neoplasm of upper-outer quadrant of left breast in female, estrogen receptor positive   Histology per Pathology Report:  (Definitive pathology pending upcoming lumpectomy) 08/17/2021 Diagnosis Breast, left, needle core biopsy, left breast 1:00, same - INVASIVE MAMMARY CARCINOMA, GRADE 1/2.  Receptor Status: ER(95%), PR (100%), Her2-neu (Negative via FISH), Ki-67(5%)  Did patient present with symptoms (if so, please note symptoms) or was this found on screening mammography?: Patient had a small mass seen in the left breast on screening mammography abdomen composition. By ultrasound it measured 8 mm. The axilla was unremarkable  Past/Anticipated interventions by surgeon, if any:  09/26/2021 --Dr. Casimer Bilis Scheduled for: LEFT BREAST LUMPECTOMY WITH RADIOACTIVE SEED LOCALIZATION  Past/Anticipated interventions by medical oncology, if any:  Under care of Dr. Truitt Merle  09/18/2021 Given the early stage disease, she is scheduled for left lumpectomy on 09/26/21 under Dr. Ninfa Linden.   Due to her advanced age and early stage disease, sentinel lymph node biopsy will likely not performed Given her advanced age, she is not a candidate for chemotherapy, I do not recommend Oncotype. Giving the strong ER and PR expression in her postmenopausal status, I recommend adjuvant endocrine therapy with aromatase inhibitor or Tamoxifen for a total of 5-10 years to reduce the risk of cancer recurrence. Potential benefits and side effects were discussed with patient and she is interested.  Due to her moderate arthralgia and osteoporosis, tamoxifen is likely a better option for her. She will also see radiation oncologist Dr. Isidore Moos tomorrow. She will likely benefit from breast radiation if she undergo lumpectomy to decrease the risk of breast cancer.  we will obtain baseline labs today. We also discussed the breast cancer surveillance after her surgery. She will continue annual  screening mammogram, self exam, and a routine office visit with lab and exam with [place].  Lymphedema issues, if any:  Patient denies    Pain issues, if any:  Patient denies   SAFETY ISSUES: Prior radiation? No Pacemaker/ICD? No Possible current pregnancy? No--postmenopausal  Is the patient on methotrexate? No  Current Complaints / other details:  Nothing else of note

## 2021-09-19 ENCOUNTER — Ambulatory Visit
Admission: RE | Admit: 2021-09-19 | Discharge: 2021-09-19 | Disposition: A | Discharge status: Home / Self Care | Payer: Medicare Other | Source: Ambulatory Visit | Attending: Radiation Oncology | Admitting: Radiation Oncology

## 2021-09-19 ENCOUNTER — Telehealth: Payer: Self-pay | Admitting: *Deleted

## 2021-09-19 ENCOUNTER — Encounter: Payer: Self-pay | Admitting: Radiation Oncology

## 2021-09-19 ENCOUNTER — Encounter: Payer: Self-pay | Admitting: *Deleted

## 2021-09-19 VITALS — BP 160/88 | HR 80 | Temp 96.8°F | Resp 18 | Ht 63.0 in | Wt 173.4 lb

## 2021-09-19 DIAGNOSIS — G4730 Sleep apnea, unspecified: Secondary | ICD-10-CM | POA: Insufficient documentation

## 2021-09-19 DIAGNOSIS — C50412 Malignant neoplasm of upper-outer quadrant of left female breast: Secondary | ICD-10-CM

## 2021-09-19 DIAGNOSIS — I10 Essential (primary) hypertension: Secondary | ICD-10-CM | POA: Diagnosis not present

## 2021-09-19 DIAGNOSIS — Z79899 Other long term (current) drug therapy: Secondary | ICD-10-CM | POA: Insufficient documentation

## 2021-09-19 DIAGNOSIS — Z803 Family history of malignant neoplasm of breast: Secondary | ICD-10-CM | POA: Insufficient documentation

## 2021-09-19 DIAGNOSIS — E785 Hyperlipidemia, unspecified: Secondary | ICD-10-CM | POA: Diagnosis not present

## 2021-09-19 DIAGNOSIS — Z791 Long term (current) use of non-steroidal anti-inflammatories (NSAID): Secondary | ICD-10-CM | POA: Diagnosis not present

## 2021-09-19 DIAGNOSIS — Z170 Estrogen receptor positive status [ER+]: Secondary | ICD-10-CM | POA: Insufficient documentation

## 2021-09-19 DIAGNOSIS — K219 Gastro-esophageal reflux disease without esophagitis: Secondary | ICD-10-CM | POA: Diagnosis not present

## 2021-09-19 NOTE — Telephone Encounter (Signed)
Spoke with patient to follow up from her new patient appt and assess navigation needs. Patient denies any questions or concerns at this time.  Encouraged her to call should anything arise.

## 2021-09-20 ENCOUNTER — Encounter: Payer: Self-pay | Admitting: Radiation Oncology

## 2021-09-20 NOTE — Progress Notes (Signed)
Surgical Instructions    Your procedure is scheduled on Tuesday, January 24th, 2023.   Report to [facility] Main Entrance "A" at 06:00 A.M., then check in with the Admitting office.  Call this number if you have problems the morning of surgery:  [phone]   If you have any questions prior to your surgery date call [phone]: Open Monday-Friday 8am-4pm    Remember:  Do not eat after midnight the night before your surgery  You may drink clear liquids until 05:00 the morning of your surgery.   Clear liquids allowed are: Water, Non-Citrus Juices (without pulp), Carbonated Beverages, Clear Tea, Black Coffee ONLY (NO MILK, CREAM OR POWDERED CREAMER of any kind), and Gatorade  Patient Instructions   The day of surgery (if you have diabetes): Drink ONE (1) 12 oz G2 given to you in your pre admission testing appointment by 05:00 the morning of surgery. Drink in one sitting. Do not sip.  This drink was given to you during your hospital  pre-op appointment visit.  Nothing else to drink after completing the  12 oz bottle of G2.         If you have questions, please contact your surgeons office.     Take these medicines the morning of surgery with A SIP OF WATER:  amLODipine (NORVASC) oxybutynin (DITROPAN-XL) triamcinolone (NASACORT)   If needed:  Artificial Tear Ointment (DRY EYES OP) HYDROcodone-acetaminophen (NORCO/VICODIN)   As of today, STOP taking any Aspirin (unless otherwise instructed by your surgeon) Aleve, Naproxen, Ibuprofen, Motrin, Advil, Goody's, BC's, all herbal medications, fish oil, and all vitamins.   WHAT DO I DO ABOUT MY DIABETES MEDICATION?   HOW TO MANAGE YOUR DIABETES BEFORE AND AFTER SURGERY  Why is it important to control my blood sugar before and after surgery? Improving blood sugar levels before and after surgery helps healing and can limit problems. A way of improving blood sugar control is eating a healthy diet by:  Eating less sugar and  carbohydrates  Increasing activity/exercise  Talking with your doctor about reaching your blood sugar goals High blood sugars (greater than 180 mg/dL) can raise your risk of infections and slow your recovery, so you will need to focus on controlling your diabetes during the weeks before surgery. Make sure that the doctor who takes care of your diabetes knows about your planned surgery including the date and location.  How do I manage my blood sugar before surgery? Check your blood sugar at least 4 times a day, starting 2 days before surgery, to make sure that the level is not too high or low.  Check your blood sugar the morning of your surgery when you wake up and every 2 hours until you get to the Short Stay unit.  If your blood sugar is less than 70 mg/dL, you will need to treat for low blood sugar: Do not take insulin. Treat a low blood sugar (less than 70 mg/dL) with  cup of clear juice (cranberry or apple), 4 glucose tablets, OR glucose gel. Recheck blood sugar in 15 minutes after treatment (to make sure it is greater than 70 mg/dL). If your blood sugar is not greater than 70 mg/dL on recheck, call [phone] for further instructions. Report your blood sugar to the short stay nurse when you get to Short Stay.  If you are admitted to the hospital after surgery: Your blood sugar will be checked by the staff and you will probably be given insulin after surgery (instead of oral diabetes  medicines) to make sure you have good blood sugar levels. The goal for blood sugar control after surgery is 80-180 mg/dL.   After your COVID test   You are not required to quarantine however you are required to wear a well-fitting mask when you are out and around people not in your household.  If your mask becomes wet or soiled, replace with a new one.  Wash your hands often with soap and water for 20 seconds or clean your hands with an alcohol-based hand sanitizer that contains at least 60%  alcohol.  Do not share personal items.  Notify your provider: if you are in close contact with someone who has COVID  or if you develop a fever of 100.4 or greater, sneezing, cough, sore throat, shortness of breath or body aches.           Do not wear jewelry or makeup Do not wear lotions, powders, perfumes/colognes, or deodorant. Do not shave 48 hours prior to surgery.  Men may shave face and neck. Do not bring valuables to the hospital. DO Not wear nail polish, gel polish, artificial nails, or any other type of covering on natural nails (fingers and toes) If you have artificial nails or gel coating that need to be removed by a nail salon, please have this removed prior to surgery. Artificial nails or gel coating may interfere with anesthesia's ability to adequately monitor your vital signs.             [place] is not responsible for any belongings or valuables.  Do NOT Smoke (Tobacco/Vaping)  24 hours prior to your procedure  If you use a CPAP at night, you may bring your mask for your overnight stay.   Contacts, glasses, hearing aids, dentures or partials may not be worn into surgery, please bring cases for these belongings   For patients admitted to the hospital, discharge time will be determined by your treatment team.   Patients discharged the day of surgery will not be allowed to drive home, and someone needs to stay with them for 24 hours.  NO VISITORS WILL BE ALLOWED IN PRE-OP WHERE PATIENTS ARE PREPPED FOR SURGERY.  ONLY 1 SUPPORT PERSON MAY BE PRESENT IN THE WAITING ROOM WHILE YOU ARE IN SURGERY.  IF YOU ARE TO BE ADMITTED, ONCE YOU ARE IN YOUR ROOM YOU WILL BE ALLOWED TWO (2) VISITORS. 1 (ONE) VISITOR MAY STAY OVERNIGHT BUT MUST ARRIVE TO THE ROOM BY 8pm.  Minor children may have two parents present. Special consideration for safety and communication needs will be reviewed on a case by case basis.  Special instructions:    Oral Hygiene is also important to reduce your  risk of infection.  Remember - BRUSH YOUR TEETH THE MORNING OF SURGERY WITH YOUR REGULAR TOOTHPASTE   [place]- Preparing For Surgery  Before surgery, you can play an important role. Because skin is not sterile, your skin needs to be as free of germs as possible. You can reduce the number of germs on your skin by washing with CHG (chlorahexidine gluconate) Soap before surgery.  CHG is an antiseptic cleaner which kills germs and bonds with the skin to continue killing germs even after washing.     Please do not use if you have an allergy to CHG or antibacterial soaps. If your skin becomes reddened/irritated stop using the CHG.  Do not shave (including legs and underarms) for at least 48 hours prior to first CHG shower. It is OK  to shave your face.  Please follow these instructions carefully.     Shower the NIGHT BEFORE SURGERY and the MORNING OF SURGERY with CHG Soap.   If you chose to wash your hair, wash your hair first as usual with your normal shampoo. After you shampoo, rinse your hair and body thoroughly to remove the shampoo.  Then [employer] and genitals (private parts) with your normal soap and rinse thoroughly to remove soap.  After that Use CHG Soap as you would any other liquid soap. You can apply CHG directly to the skin and wash gently with a scrungie or a clean washcloth.   Apply the CHG Soap to your body ONLY FROM THE NECK DOWN.  Do not use on open wounds or open sores. Avoid contact with your eyes, ears, mouth and genitals (private parts). Wash Face and genitals (private parts)  with your normal soap.   Wash thoroughly, paying special attention to the area where your surgery will be performed.  Thoroughly rinse your body with warm water from the neck down.  DO NOT shower/wash with your normal soap after using and rinsing off the CHG Soap.  Pat yourself dry with a CLEAN TOWEL.  Wear CLEAN PAJAMAS to bed the night before surgery  Place CLEAN SHEETS on your bed the night  before your surgery  DO NOT SLEEP WITH PETS.   Day of Surgery:  Take a shower with CHG soap. Wear Clean/Comfortable clothing the morning of surgery Do not apply any deodorants/lotions.   Remember to brush your teeth WITH YOUR REGULAR TOOTHPASTE.   Please read over the following fact sheets that you were given.

## 2021-09-21 ENCOUNTER — Encounter (HOSPITAL_COMMUNITY): Payer: Self-pay

## 2021-09-21 ENCOUNTER — Other Ambulatory Visit: Payer: Self-pay

## 2021-09-21 ENCOUNTER — Encounter (HOSPITAL_COMMUNITY)
Admission: RE | Admit: 2021-09-21 | Discharge: 2021-09-21 | Disposition: A | Discharge status: Home / Self Care | Payer: Medicare Other | Source: Ambulatory Visit | Attending: Surgery | Admitting: Surgery

## 2021-09-21 VITALS — BP 178/88 | HR 89 | Temp 98.9°F | Resp 18 | Ht 63.0 in | Wt 172.7 lb

## 2021-09-21 DIAGNOSIS — G4733 Obstructive sleep apnea (adult) (pediatric): Secondary | ICD-10-CM | POA: Insufficient documentation

## 2021-09-21 DIAGNOSIS — R011 Cardiac murmur, unspecified: Secondary | ICD-10-CM | POA: Insufficient documentation

## 2021-09-21 DIAGNOSIS — E119 Type 2 diabetes mellitus without complications: Secondary | ICD-10-CM | POA: Diagnosis not present

## 2021-09-21 DIAGNOSIS — Z01818 Encounter for other preprocedural examination: Secondary | ICD-10-CM | POA: Diagnosis not present

## 2021-09-21 HISTORY — DX: Type 2 diabetes mellitus without complications: E11.9

## 2021-09-21 LAB — SURGICAL PCR SCREEN
MRSA, PCR: NEGATIVE
Staphylococcus aureus: NEGATIVE

## 2021-09-21 LAB — GLUCOSE, CAPILLARY: Glucose-Capillary: 117 mg/dL — ABNORMAL HIGH (ref 70–99)

## 2021-09-21 NOTE — Progress Notes (Signed)
PCP -  Isaac Bliss, [place], MD Cardiologist - denies Oncologist - Eppie Gibson, MD Hematologist - Truitt Merle, MD  PPM/ICD - denies Device Orders - n/a Rep Notified - n/a  Chest x-ray - n/a EKG - 09/21/2021 Stress Test - denies ECHO - denies Cardiac Cath - denies  Sleep Study - yes, positive for OSA CPAP - patient is not using at this time  Fasting Blood Sugar - patient can't remember Checks Blood Sugar once a day CBG today - 117 A1C - 6.2 on 08/24/2021  Blood Thinner Instructions: n/a  Aspirin Instructions: Patient was instructed: As of today, STOP taking any Aspirin (unless otherwise instructed by your surgeon) Aleve, Naproxen, Ibuprofen, Motrin, Advil, Goody's, BC's, all herbal medications, fish oil, and all vitamins.  ERAS Protcol - yes PRE-SURGERY - G2- until 05:00 AM  COVID TEST- no - ambulatory surgery   Anesthesia review: yes - history of heart murmur - per patient, very slight all her life (no echocardiogram); hallucination with hiatal hernia surgery (2008). BP elevated in PAT - 177/88. Patient verbalized that she had her BP medication today. At the end of PAT appointment, BP was 150/90.   Patient denies shortness of breath, fever, cough and chest pain at PAT appointment   All instructions explained to the patient, with a verbal understanding of the material. Patient agrees to go over the instructions while at home for a better understanding. Patient also instructed to self quarantine after being tested for COVID-19. The opportunity to ask questions was provided.

## 2021-09-21 NOTE — Progress Notes (Signed)
Anesthesia Chart Review:  Evaluated patient at preadmission testing appointment due to reported history of heart murmur.  She states she has been told she has a murmur for as long as she can recall (I find documentation of murmur in epic going back to 2015, she recalls being told of a murmur for 50+ years).  She states she has never had any advanced cardiovascular testing.  She does report she saw a cardiologist many years ago prior to having surgery and says evaluation was benign and no follow-up was recommended.  She does not recall the cardiologist that she saw at the time.  She denies any cardiovascular symptoms, denies any chest pain or shortness of breath.  She reports she can go up 2 flights of stairs without any cardiopulmonary limitations, no chest pain, no DOE.  On exam, she is well-appearing, NAD.  Heart regular rate and rhythm, 2/6 systolic murmur.  Lungs CTAB.  OSA, intolerant to CPAP.  DM2, well controlled, last A1c 6.2 on 08/24/2021.  Patient has strong history of emergence delirium.  States she has previously pulled out IVs and hallucinated postoperatively.  Reviewed history of murmur with anesthesiologist Dr. Gifford Shave.  He advised that given nature of procedure and patient's good functional status she can proceed as planned.  CMP and CBC 09/18/2021 reviewed, unremarkable.  EKG 09/21/2021: Normal sinus rhythm with sinus arrhythmia.  Rate 80. Cannot rule out Anterior infarct , age undetermined.  No significant change from prior.    Wynonia Musty [facility] Short Stay Center/Anesthesiology Phone [phone] 09/22/2021 4:39 PM

## 2021-09-22 NOTE — Anesthesia Preprocedure Evaluation (Addendum)
Anesthesia Evaluation  Patient identified by MRN, date of birth, ID band  Reviewed: Allergy & Precautions, NPO status , Patient's Chart, lab work & pertinent test results  Airway Mallampati: I       Dental no notable dental hx.    Pulmonary sleep apnea ,    Pulmonary exam normal        Cardiovascular hypertension, Pt. on medications Normal cardiovascular exam     Neuro/Psych PSYCHIATRIC DISORDERS Anxiety Depression negative neurological ROS     GI/Hepatic Neg liver ROS,   Endo/Other  diabetes  Renal/GU negative Renal ROS  negative genitourinary   Musculoskeletal  (+) Arthritis , Osteoarthritis,    Abdominal Normal abdominal exam  (+)   Peds  Hematology negative hematology ROS (+)   Anesthesia Other Findings   Reproductive/Obstetrics                            Anesthesia Physical Anesthesia Plan  ASA: 2  Anesthesia Plan: MAC   Post-op Pain Management:    Induction:   PONV Risk Score and Plan: 2 and Ondansetron  Airway Management Planned: Natural Airway and Simple Face Mask  Additional Equipment: None  Intra-op Plan:   Post-operative Plan:   Informed Consent: I have reviewed the patients History and Physical, chart, labs and discussed the procedure including the risks, benefits and alternatives for the proposed anesthesia with the patient or authorized representative who has indicated his/her understanding and acceptance.     Dental advisory given  Plan Discussed with:   Anesthesia Plan Comments: (PAT note by Karoline Caldwell, PA-C:  Evaluated patient at preadmission testing appointment due to reported history of heart murmur.  She states she has been told she has a murmur for as long as she can recall (I find documentation of murmur in epic going back to 2015, she recalls being told of a murmur for 50+ years).  She states she has never had any advanced cardiovascular testing.   She does report she saw a cardiologist many years ago prior to having surgery and says evaluation was benign and no follow-up was recommended.  She does not recall the cardiologist that she saw at the time.  She denies any cardiovascular symptoms, denies any chest pain or shortness of breath.  She reports she can go up 2 flights of stairs without any cardiopulmonary limitations, no chest pain, no DOE.  On exam, she is well-appearing, NAD.  Heart regular rate and rhythm, 2/6 systolic murmur.  Lungs CTAB.  OSA, intolerant to CPAP.  DM2, well controlled, last A1c 6.2 on 08/24/2021.  Patient has strong history of emergence delirium.  States she has previously pulled out IVs and hallucinated postoperatively.  Reviewed history of murmur with anesthesiologist Dr. Gifford Shave.  He advised that given nature of procedure and patient's good functional status she can proceed as planned.  CMP and CBC 09/18/2021 reviewed, unremarkable.  EKG 09/21/2021: Normal sinus rhythm with sinus arrhythmia.  Rate 80. Cannot rule out Anterior infarct , age undetermined.  No significant change from prior. )       Anesthesia Quick Evaluation

## 2021-09-25 ENCOUNTER — Other Ambulatory Visit: Payer: Self-pay | Admitting: Internal Medicine

## 2021-09-25 ENCOUNTER — Ambulatory Visit
Admission: RE | Admit: 2021-09-25 | Discharge: 2021-09-25 | Disposition: A | Discharge status: Home / Self Care | Payer: Medicare Other | Source: Ambulatory Visit | Attending: Surgery | Admitting: Surgery

## 2021-09-25 DIAGNOSIS — R928 Other abnormal and inconclusive findings on diagnostic imaging of breast: Secondary | ICD-10-CM | POA: Diagnosis not present

## 2021-09-25 DIAGNOSIS — F411 Generalized anxiety disorder: Secondary | ICD-10-CM

## 2021-09-25 DIAGNOSIS — Z853 Personal history of malignant neoplasm of breast: Secondary | ICD-10-CM

## 2021-09-25 NOTE — Telephone Encounter (Signed)
Last refill per controlled substance database: 09/01/21 Last OV: 08/24/21 Next OV: 11/22/21

## 2021-09-25 NOTE — H&P (Signed)
REFERRING PHYSICIAN:  Isaac Bliss, [place]*   PROVIDER:  Beverlee Nims, MD   MRN: [id number] DOB: 04-May-1941 DATE OF ENCOUNTER: 08/29/2021 Subjective  Chief Complaint: New Consultation (Left Breast Cancer )     History of Present Illness: [name] is a 81 y.o. female who is seen today as an office consultation at the request of Dr. Isaac Bliss for evaluation of New Consultation (Left Breast Cancer )  .     This patient is referred after recent diagnosis of breast cancer.  She has a small mass seen in the left breast on screening mammography .  By ultrasound it measured 8 mm.  The axilla was unremarkable.  She underwent a biopsy showing invasive mammary carcinoma 95% ER and 100% PR positive with a Ki-67 of 5% percent.  She has had no previous problems regarding her breast.  She denies nipple discharge.  She has a family history of breast cancer in her mother.  She is otherwise without    Review of Systems: A complete review of systems was obtained from the patient.  I have reviewed this information and discussed as appropriate with the patient.  See HPI as well for other ROS.   ROS    Medical History: Past Medical History      Past Medical History:  Diagnosis Date   Anxiety     Arthritis     History of cancer     Hyperlipidemia     Hypertension     Sleep apnea             Patient Active Problem List  Diagnosis   Allergic rhinitis   Bilateral impacted cerumen   Conductive hearing loss   Cyst and pseudocyst of pancreas   DM (diabetes mellitus), type 2 (CMS-HCC)   Essential hypertension   Generalized anxiety disorder   GERD (gastroesophageal reflux disease)   Hyperlipidemia   Obesity (BMI 30-39.9), unspecified   Osteoarthritis   Overactive bladder   Pre-diabetes   Pseudophakia   Routine general medical examination at a health care facility      Past Surgical History       Past Surgical History:  Procedure Laterality Date   BACK SURGERY              Allergies       Allergies  Allergen Reactions   Thiazides Other (See Comments)      Other reaction(s): Lethargy (intolerance) "could not physically move" Other reaction(s): Lethargy (intolerance)     Zolpidem Tartrate Other (See Comments)      amnesia amnesia     Prednisone Rash      Rapid heart rate Rapid heart rate     Sulfamethoxazole Rash        No current outpatient medications on file prior to visit.    No current facility-administered medications on file prior to visit.      Family History  History reviewed. No pertinent family history.      Social History       Tobacco Use  Smoking Status Never  Smokeless Tobacco Never      Social History  Social History        Socioeconomic History   Marital status: Married  Tobacco Use   Smoking status: Never   Smokeless tobacco: Never  Vaping Use   Vaping Use: Unknown  Substance and Sexual Activity   Alcohol use: Not Currently   Drug use: Never  Objective:       Vitals:    08/29/21 1447  BP: (!) 172/80  Pulse: (!) 133  Temp: 36.2 C (97.1 F)  SpO2: 96%  Weight: 78.9 kg (174 lb)  Height: 162.6 cm ('5\' 4"' )    Body mass index is 29.87 kg/m.   Physical Exam    She appears well on exam   There are no palpable breast masses.  There is no axillary adenopathy.   Labs, Imaging and Diagnostic Testing: I reviewed her mammogram, ultrasound, and the results   Assessment and Plan:     Diagnoses and all orders for this visit:   Malignant neoplasm of upper-outer quadrant of left breast in female, estrogen receptor positive (CMS-HCC) -     Ambulatory Referral to Oncology-Medical -     Ambulatory Referral to Radiation Oncology       She has an invasive mammary breast cancer.  I gave her and her husband a copy of the pathology results   We discussed breast cancer in detail.  We discussed surgical options which includes breast conservation versus mastectomy.  She wants to proceed  with breast conservation.  I next discussed proceeding with a radioactive seed guided left breast lumpectomy.  We discussed the procedure in detail.  We discussed the risks which include but is not limited to bleeding, infection, and need for further surgery if margins are positive, injury to surrounding structures, cardiopulmonary issues, etc.  She understands and wishes to proceed.  She will also be referred to medical and radiation oncology.

## 2021-09-26 ENCOUNTER — Other Ambulatory Visit: Payer: Self-pay | Admitting: Internal Medicine

## 2021-09-26 ENCOUNTER — Ambulatory Visit
Admission: RE | Admit: 2021-09-26 | Discharge: 2021-09-26 | Disposition: A | Discharge status: Home / Self Care | Payer: Medicare Other | Source: Ambulatory Visit | Attending: Surgery | Admitting: Surgery

## 2021-09-26 ENCOUNTER — Ambulatory Visit (HOSPITAL_COMMUNITY)
Admission: RE | Admit: 2021-09-26 | Discharge: 2021-09-26 | Disposition: A | Discharge status: Home / Self Care | Payer: Medicare Other | Attending: Surgery | Admitting: Surgery

## 2021-09-26 ENCOUNTER — Other Ambulatory Visit: Payer: Self-pay

## 2021-09-26 ENCOUNTER — Encounter (HOSPITAL_COMMUNITY): Payer: Self-pay | Admitting: Surgery

## 2021-09-26 ENCOUNTER — Ambulatory Visit (HOSPITAL_COMMUNITY): Payer: Medicare Other | Admitting: Certified Registered Nurse Anesthetist

## 2021-09-26 ENCOUNTER — Ambulatory Visit (HOSPITAL_COMMUNITY): Payer: Medicare Other | Admitting: Physician Assistant

## 2021-09-26 ENCOUNTER — Encounter (HOSPITAL_COMMUNITY)
Admission: RE | Disposition: A | Discharge status: Home / Self Care | Payer: Self-pay | Source: Home / Self Care | Attending: Surgery

## 2021-09-26 DIAGNOSIS — F411 Generalized anxiety disorder: Secondary | ICD-10-CM

## 2021-09-26 DIAGNOSIS — I10 Essential (primary) hypertension: Secondary | ICD-10-CM | POA: Insufficient documentation

## 2021-09-26 DIAGNOSIS — Z803 Family history of malignant neoplasm of breast: Secondary | ICD-10-CM | POA: Insufficient documentation

## 2021-09-26 DIAGNOSIS — F32A Depression, unspecified: Secondary | ICD-10-CM | POA: Diagnosis not present

## 2021-09-26 DIAGNOSIS — N6092 Unspecified benign mammary dysplasia of left breast: Secondary | ICD-10-CM | POA: Insufficient documentation

## 2021-09-26 DIAGNOSIS — Z79899 Other long term (current) drug therapy: Secondary | ICD-10-CM

## 2021-09-26 DIAGNOSIS — G894 Chronic pain syndrome: Secondary | ICD-10-CM

## 2021-09-26 DIAGNOSIS — C50912 Malignant neoplasm of unspecified site of left female breast: Secondary | ICD-10-CM | POA: Diagnosis not present

## 2021-09-26 DIAGNOSIS — E119 Type 2 diabetes mellitus without complications: Secondary | ICD-10-CM | POA: Insufficient documentation

## 2021-09-26 DIAGNOSIS — G4730 Sleep apnea, unspecified: Secondary | ICD-10-CM | POA: Insufficient documentation

## 2021-09-26 DIAGNOSIS — Z170 Estrogen receptor positive status [ER+]: Secondary | ICD-10-CM | POA: Diagnosis not present

## 2021-09-26 DIAGNOSIS — F419 Anxiety disorder, unspecified: Secondary | ICD-10-CM | POA: Insufficient documentation

## 2021-09-26 DIAGNOSIS — C50412 Malignant neoplasm of upper-outer quadrant of left female breast: Secondary | ICD-10-CM | POA: Insufficient documentation

## 2021-09-26 DIAGNOSIS — D0512 Intraductal carcinoma in situ of left breast: Secondary | ICD-10-CM | POA: Diagnosis not present

## 2021-09-26 DIAGNOSIS — Z853 Personal history of malignant neoplasm of breast: Secondary | ICD-10-CM

## 2021-09-26 DIAGNOSIS — R928 Other abnormal and inconclusive findings on diagnostic imaging of breast: Secondary | ICD-10-CM | POA: Diagnosis not present

## 2021-09-26 HISTORY — PX: BREAST LUMPECTOMY WITH RADIOACTIVE SEED LOCALIZATION: SHX6424

## 2021-09-26 LAB — GLUCOSE, CAPILLARY
Glucose-Capillary: 134 mg/dL — ABNORMAL HIGH (ref 70–99)
Glucose-Capillary: 108 mg/dL — ABNORMAL HIGH (ref 70–99)

## 2021-09-26 SURGERY — BREAST LUMPECTOMY WITH RADIOACTIVE SEED LOCALIZATION
Anesthesia: Monitor Anesthesia Care | Site: Breast | Laterality: Left

## 2021-09-26 MED ORDER — PROPOFOL 10 MG/ML IV BOLUS
INTRAVENOUS | Status: AC
  Filled 2021-09-26: qty 20

## 2021-09-26 MED ORDER — OXYCODONE HCL 5 MG PO TABS: 5.0000 mg | ORAL_TABLET | Freq: Once | ORAL | Status: DC | PRN

## 2021-09-26 MED ORDER — ORAL CARE MOUTH RINSE: 15.0000 mL | Freq: Once | OROMUCOSAL | Status: AC

## 2021-09-26 MED ORDER — ENSURE PRE-SURGERY PO LIQD: 296.0000 mL | Freq: Once | ORAL | Status: DC

## 2021-09-26 MED ORDER — CHLORHEXIDINE GLUCONATE CLOTH 2 % EX PADS: 6.0000 | MEDICATED_PAD | Freq: Once | CUTANEOUS | Status: DC

## 2021-09-26 MED ORDER — FENTANYL CITRATE (PF) 100 MCG/2ML IJ SOLN
INTRAMUSCULAR | Status: AC
  Filled 2021-09-26: qty 2

## 2021-09-26 MED ORDER — ONDANSETRON HCL 4 MG/2ML IJ SOLN: 4.0000 mg | Freq: Once | INTRAMUSCULAR | Status: DC | PRN

## 2021-09-26 MED ORDER — OXYCODONE HCL 5 MG/5ML PO SOLN: 5.0000 mg | Freq: Once | ORAL | Status: DC | PRN

## 2021-09-26 MED ORDER — FENTANYL CITRATE (PF) 250 MCG/5ML IJ SOLN
INTRAMUSCULAR | Status: AC
  Filled 2021-09-26: qty 5

## 2021-09-26 MED ORDER — LIDOCAINE HCL (PF) 1 % IJ SOLN
INTRAMUSCULAR | Status: AC
  Filled 2021-09-26: qty 30

## 2021-09-26 MED ORDER — FENTANYL CITRATE (PF) 100 MCG/2ML IJ SOLN: 25.0000 ug | INTRAMUSCULAR | Status: DC | PRN

## 2021-09-26 MED ORDER — HYDROCODONE-ACETAMINOPHEN 5-325 MG PO TABS: 1.0000 | ORAL_TABLET | Freq: Four times a day (QID) | ORAL | 0 refills | Status: DC | PRN

## 2021-09-26 MED ORDER — ONDANSETRON HCL 4 MG/2ML IJ SOLN
INTRAMUSCULAR | Status: DC | PRN
  Administered 2021-09-26: 4 mg via INTRAVENOUS

## 2021-09-26 MED ORDER — LIDOCAINE HCL (PF) 1 % IJ SOLN
INTRAMUSCULAR | Status: DC | PRN
  Administered 2021-09-26: 20 mL

## 2021-09-26 MED ORDER — FENTANYL CITRATE (PF) 100 MCG/2ML IJ SOLN
25.0000 ug | INTRAMUSCULAR | Status: DC | PRN
  Administered 2021-09-26: 09:00:00 25 ug via INTRAVENOUS

## 2021-09-26 MED ORDER — CHLORHEXIDINE GLUCONATE 0.12 % MT SOLN
15.0000 mL | Freq: Once | OROMUCOSAL | Status: AC
  Administered 2021-09-26: 07:00:00 15 mL via OROMUCOSAL
  Filled 2021-09-26: qty 15

## 2021-09-26 MED ORDER — OXYCODONE HCL 5 MG/5ML PO SOLN
5.0000 mg | Freq: Once | ORAL | Status: DC | PRN
Start: 2021-09-26 — End: 2021-09-26

## 2021-09-26 MED ORDER — PROPOFOL 500 MG/50ML IV EMUL
INTRAVENOUS | Status: DC | PRN
  Administered 2021-09-26: 50 ug/kg/min via INTRAVENOUS

## 2021-09-26 MED ORDER — MEPERIDINE HCL 25 MG/ML IJ SOLN: 6.2500 mg | INTRAMUSCULAR | Status: DC | PRN

## 2021-09-26 MED ORDER — ACETAMINOPHEN 325 MG PO TABS: 325.0000 mg | ORAL_TABLET | ORAL | Status: DC | PRN

## 2021-09-26 MED ORDER — ROCURONIUM BROMIDE 10 MG/ML (PF) SYRINGE
PREFILLED_SYRINGE | INTRAVENOUS | Status: AC
  Filled 2021-09-26: qty 10

## 2021-09-26 MED ORDER — 0.9 % SODIUM CHLORIDE (POUR BTL) OPTIME
TOPICAL | Status: DC | PRN
  Administered 2021-09-26: 08:00:00 1000 mL

## 2021-09-26 MED ORDER — ACETAMINOPHEN 160 MG/5ML PO SOLN: 325.0000 mg | ORAL | Status: DC | PRN

## 2021-09-26 MED ORDER — LIDOCAINE 2% (20 MG/ML) 5 ML SYRINGE
INTRAMUSCULAR | Status: AC
  Filled 2021-09-26: qty 5

## 2021-09-26 MED ORDER — CEFAZOLIN SODIUM-DEXTROSE 2-4 GM/100ML-% IV SOLN
2.0000 g | INTRAVENOUS | Status: AC
  Administered 2021-09-26: 08:00:00 2 g via INTRAVENOUS
  Filled 2021-09-26: qty 100

## 2021-09-26 MED ORDER — LACTATED RINGERS IV SOLN: INTRAVENOUS | Status: DC | PRN

## 2021-09-26 MED ORDER — ACETAMINOPHEN 500 MG PO TABS
1000.0000 mg | ORAL_TABLET | ORAL | Status: DC
  Filled 2021-09-26: qty 2

## 2021-09-26 MED ORDER — FENTANYL CITRATE (PF) 250 MCG/5ML IJ SOLN
INTRAMUSCULAR | Status: DC | PRN
  Administered 2021-09-26 (×2): 50 ug via INTRAVENOUS

## 2021-09-26 MED ORDER — DEXAMETHASONE SODIUM PHOSPHATE 10 MG/ML IJ SOLN
INTRAMUSCULAR | Status: AC
  Filled 2021-09-26: qty 1

## 2021-09-26 MED ORDER — ONDANSETRON HCL 4 MG/2ML IJ SOLN
INTRAMUSCULAR | Status: AC
  Filled 2021-09-26: qty 2

## 2021-09-26 MED ORDER — PROPOFOL 10 MG/ML IV BOLUS
INTRAVENOUS | Status: DC | PRN
  Administered 2021-09-26: 20 mg via INTRAVENOUS

## 2021-09-26 MED ORDER — LACTATED RINGERS IV SOLN: INTRAVENOUS | Status: DC

## 2021-09-26 MED ORDER — BUPIVACAINE-EPINEPHRINE 0.25% -1:200000 IJ SOLN
INTRAMUSCULAR | Status: DC | PRN
  Administered 2021-09-26: 20 mL

## 2021-09-26 MED ORDER — BUPIVACAINE-EPINEPHRINE (PF) 0.25% -1:200000 IJ SOLN
INTRAMUSCULAR | Status: AC
  Filled 2021-09-26: qty 30

## 2021-09-26 SURGICAL SUPPLY — 33 items
APPLIER CLIP 9.375 MED OPEN (MISCELLANEOUS) ×3
BAG COUNTER SPONGE SURGICOUNT (BAG) ×2 IMPLANT
BAG SURGICOUNT SPONGE COUNTING (BAG) ×1
BINDER BREAST LRG (GAUZE/BANDAGES/DRESSINGS) IMPLANT
BINDER BREAST XLRG (GAUZE/BANDAGES/DRESSINGS) ×2 IMPLANT
CANISTER SUCT 3000ML PPV (MISCELLANEOUS) ×3 IMPLANT
CHLORAPREP W/TINT 26 (MISCELLANEOUS) ×3 IMPLANT
CLIP APPLIE 9.375 MED OPEN (MISCELLANEOUS) ×1 IMPLANT
COVER PROBE W GEL 5X96 (DRAPES) ×3 IMPLANT
COVER SURGICAL LIGHT HANDLE (MISCELLANEOUS) ×3 IMPLANT
DERMABOND ADVANCED (GAUZE/BANDAGES/DRESSINGS) ×2
DERMABOND ADVANCED .7 DNX12 (GAUZE/BANDAGES/DRESSINGS) ×1 IMPLANT
DEVICE DUBIN SPECIMEN MAMMOGRA (MISCELLANEOUS) ×3 IMPLANT
DRAPE CHEST BREAST 15X10 FENES (DRAPES) ×3 IMPLANT
ELECT CAUTERY BLADE 6.4 (BLADE) ×3 IMPLANT
ELECT REM PT RETURN 9FT ADLT (ELECTROSURGICAL) ×3
ELECTRODE REM PT RTRN 9FT ADLT (ELECTROSURGICAL) ×1 IMPLANT
GLOVE SURG SIGNA 7.5 PF LTX (GLOVE) ×3 IMPLANT
GOWN STRL REUS W/ TWL LRG LVL3 (GOWN DISPOSABLE) ×1 IMPLANT
GOWN STRL REUS W/ TWL XL LVL3 (GOWN DISPOSABLE) ×1 IMPLANT
GOWN STRL REUS W/TWL LRG LVL3 (GOWN DISPOSABLE) ×3
GOWN STRL REUS W/TWL XL LVL3 (GOWN DISPOSABLE) ×3
KIT BASIN OR (CUSTOM PROCEDURE TRAY) ×3 IMPLANT
KIT MARKER MARGIN INK (KITS) ×3 IMPLANT
NDL HYPO 25GX1X1/2 BEV (NEEDLE) ×1 IMPLANT
NEEDLE HYPO 25GX1X1/2 BEV (NEEDLE) ×3 IMPLANT
NS IRRIG 1000ML POUR BTL (IV SOLUTION) ×2 IMPLANT
PACK GENERAL/GYN (CUSTOM PROCEDURE TRAY) ×3 IMPLANT
SUT MNCRL AB 4-0 PS2 18 (SUTURE) ×3 IMPLANT
SUT VIC AB 3-0 SH 18 (SUTURE) ×3 IMPLANT
SYR CONTROL 10ML LL (SYRINGE) ×3 IMPLANT
TOWEL GREEN STERILE (TOWEL DISPOSABLE) ×3 IMPLANT
TOWEL GREEN STERILE FF (TOWEL DISPOSABLE) ×3 IMPLANT

## 2021-09-26 NOTE — Op Note (Signed)
LEFT BREAST LUMPECTOMY WITH RADIOACTIVE SEED LOCALIZATION  Procedure Note  ALVINE MOSTAFA 09/26/2021   Pre-op Diagnosis: LEFT BREAST CANCER     Post-op Diagnosis: same  Procedure(s): LEFT BREAST LUMPECTOMY WITH RADIOACTIVE SEED LOCALIZATION  Surgeon(s): Coralie Keens, MD  Anesthesia: General  Staff:  Circulator: Maurene Capes, RN Scrub Person: Martin Majestic  Estimated Blood Loss: Minimal               Specimens: sent to path  Indications: This is an 81 year old female who was found to have a small mass in the left breast on screening mammography.  She underwent a biopsy showing an invasive ductal carcinoma.  The decision was made to proceed with a radioactive seed guided left breast lumpectomy  Procedure: The patient was brought to operating identifies correct patient.  She is placed upon the operating table and anesthesia was induced.  Using the neoprobe I located the seed in the upper outer quadrant of the left breast at least 7 cm from the nipple.  I anesthetized skin overlying the mass with lidocaine.  I then made a transverse incision with a scalpel.  I then dissected down to the breast tissue with electrocautery.  I then dissected bluntly around the incision with electrocautery staying widely around the seed.  The seed was superficial.  I then completed a lumpectomy coming underneath the radioactive seed.  I then marked all margins of the lumpectomy specimen with paint.  An x-ray was performed confirming the radioactive seed and tissue marker were in the specimen.  This was just underneath the skin so I can take no further anterior margin.  The specimen was sent to pathology for evaluation.  I anesthetized the surrounding tissue further with Marcaine.  I achieved hemostasis with the cautery.  I then placed surgical clips around the periphery of the lumpectomy cavity for marker purposes.  I then closed the subcutaneous tissue with interrupted 3-0 Vicryl sutures and  closed the skin with a running 4-0 Monocryl.  Dermabond was then applied.  The patient tolerated the procedure well.  All the counts were correct at the end of the procedure.  She is placed in a breast binder.  She was then extubated in the operating room and taken in stable condition to the recovery room.          Coralie Keens   Date: 09/26/2021  Time: 8:19 AM

## 2021-09-26 NOTE — Transfer of Care (Signed)
Immediate Anesthesia Transfer of Care Note  Patient: [name]  Procedure(s) Performed: LEFT BREAST LUMPECTOMY WITH RADIOACTIVE SEED LOCALIZATION (Left: Breast)  Patient Location: PACU  Anesthesia Type:MAC  Level of Consciousness: awake, alert  and oriented  Airway & Oxygen Therapy: Patient Spontanous Breathing  Post-op Assessment: Report given to RN, Post -op Vital signs reviewed and stable and Patient moving all extremities X 4  Post vital signs: Reviewed and stable  Last Vitals:  Vitals Value Taken Time  BP 133/67 09/26/21 0829  Temp    Pulse 64 09/26/21 0829  Resp 13 09/26/21 0830  SpO2 100 % 09/26/21 0829  Vitals shown include unvalidated device data.  Last Pain:  Vitals:   09/26/21 0704  TempSrc: Oral  PainSc: 0-No pain      Patients Stated Pain Goal: 3 (48/47/20 7218)  Complications: No notable events documented.

## 2021-09-26 NOTE — Anesthesia Procedure Notes (Signed)
Procedure Name: MAC Date/Time: 09/26/2021 7:40 AM Performed by: Harden Mo, CRNA Pre-anesthesia Checklist: Patient identified, Emergency Drugs available, Suction available and Patient being monitored Patient Re-evaluated:Patient Re-evaluated prior to induction Oxygen Delivery Method: Simple face mask Preoxygenation: Pre-oxygenation with 100% oxygen Induction Type: IV induction Placement Confirmation: positive ETCO2 Dental Injury: Teeth and Oropharynx as per pre-operative assessment

## 2021-09-26 NOTE — Discharge Instructions (Signed)
[place] Office Phone Number [phone]  BREAST BIOPSY/ PARTIAL MASTECTOMY: POST OP INSTRUCTIONS  Always review your discharge instruction sheet given to you by the facility where your surgery was performed.  IF YOU HAVE DISABILITY OR FAMILY LEAVE FORMS, YOU MUST BRING THEM TO THE OFFICE FOR PROCESSING.  DO NOT GIVE THEM TO YOUR DOCTOR.  A prescription for pain medication may be given to you upon discharge.  Take your pain medication as prescribed, if needed.  If narcotic pain medicine is not needed, then you may take acetaminophen (Tylenol) or ibuprofen (Advil) as needed. Take your usually prescribed medications unless otherwise directed If you need a refill on your pain medication, please contact your pharmacy.  They will contact our office to request authorization.  Prescriptions will not be filled after 5pm or on week-ends. You should eat very light the first 24 hours after surgery, such as soup, crackers, pudding, etc.  Resume your normal diet the day after surgery. Most patients will experience some swelling and bruising in the breast.  Ice packs and a good support bra will help.  Swelling and bruising can take several days to resolve.  It is common to experience some constipation if taking pain medication after surgery.  Increasing fluid intake and taking a stool softener will usually help or prevent this problem from occurring.  A mild laxative (Milk of Magnesia or Miralax) should be taken according to package directions if there are no bowel movements after 48 hours. Unless discharge instructions indicate otherwise, you may remove your bandages 24-48 hours after surgery, and you may shower at that time.  You may have steri-strips (small skin tapes) in place directly over the incision.  These strips should be left on the skin for 7-10 days.  If your surgeon used skin glue on the incision, you may shower in 24 hours.  The glue will flake off over the next 2-3 weeks.  Any  sutures or staples will be removed at the office during your follow-up visit. ACTIVITIES:  You may resume regular daily activities (gradually increasing) beginning the next day.  Wearing a good support bra or sports bra minimizes pain and swelling.  You may have sexual intercourse when it is comfortable. You may drive when you no longer are taking prescription pain medication, you can comfortably wear a seatbelt, and you can safely maneuver your car and apply brakes. RETURN TO WORK:  ______________________________________________________________________________________ Dennis Bast should see your doctor in the office for a follow-up appointment approximately two weeks after your surgery.  Your doctors nurse will typically make your follow-up appointment when she calls you with your pathology report.  Expect your pathology report 2-3 business days after your surgery.  You may call to check if you do not hear from [place] after three days. OTHER INSTRUCTIONS: OK TO REMOVE THE BINDER AND SHOWER STARTING TOMORROW ICE PACK, TYLENOL, AND IBUPROFEN ALSO FOR PAIN NO VIGOROUS ACTIVITY FOR ONE WEEEK _______________________________________________________________________________________________ _____________________________________________________________________________________________________________________________________ _____________________________________________________________________________________________________________________________________ _____________________________________________________________________________________________________________________________________  WHEN TO CALL YOUR DOCTOR: Fever over 101.0 Nausea and/or vomiting. Extreme swelling or bruising. Continued bleeding from incision. Increased pain, redness, or drainage from the incision.  The clinic staff is available to answer your questions during regular business hours.  Please dont hesitate to call and ask to speak to one of the  nurses for clinical concerns.  If you have a medical emergency, go to the nearest emergency room or call 911.  A surgeon from [facility] is always on call at the hospital.  For  further questions, please visit [email or web address]   °

## 2021-09-26 NOTE — Anesthesia Postprocedure Evaluation (Signed)
Anesthesia Post Note  Patient: [name]  Procedure(s) Performed: LEFT BREAST LUMPECTOMY WITH RADIOACTIVE SEED LOCALIZATION (Left: Breast)     Patient location during evaluation: Phase II Anesthesia Type: MAC Level of consciousness: awake Pain management: pain level controlled Vital Signs Assessment: post-procedure vital signs reviewed and stable Respiratory status: spontaneous breathing Cardiovascular status: stable Postop Assessment: no apparent nausea or vomiting Anesthetic complications: no   No notable events documented.  Last Vitals:  Vitals:   09/26/21 0845 09/26/21 0855  BP: (!) 141/67 (!) 148/73  Pulse: (!) 58 (!) 58  Resp: 10 19  Temp:  36.9 C  SpO2: 100% 100%    Last Pain:  Vitals:   09/26/21 0855  TempSrc:   PainSc: 0-No pain                 Huston Foley

## 2021-09-26 NOTE — Progress Notes (Signed)
Patient states that she finished her G2 as she arrived to the hospital at approximately 0545.  Dr. Tobias Alexander and CRNA, Judson Roch, aware.

## 2021-09-26 NOTE — Interval H&P Note (Signed)
History and Physical Interval Note:no change in H and P  09/26/2021 6:40 AM  [name]  has presented today for surgery, with the diagnosis of LEFT BREAST CANCER.  The various methods of treatment have been discussed with the patient and family. After consideration of risks, benefits and other options for treatment, the patient has consented to  Procedure(s): LEFT BREAST LUMPECTOMY WITH RADIOACTIVE SEED LOCALIZATION (Left) as a surgical intervention.  The patient's history has been reviewed, patient examined, no change in status, stable for surgery.  I have reviewed the patient's chart and labs.  Questions were answered to the patient's satisfaction.     Coralie Keens

## 2021-09-27 ENCOUNTER — Encounter (HOSPITAL_COMMUNITY): Payer: Self-pay | Admitting: Surgery

## 2021-09-28 LAB — SURGICAL PATHOLOGY

## 2021-10-02 ENCOUNTER — Encounter: Payer: Self-pay | Admitting: *Deleted

## 2021-10-02 ENCOUNTER — Other Ambulatory Visit: Payer: Self-pay | Admitting: Internal Medicine

## 2021-10-02 DIAGNOSIS — C50412 Malignant neoplasm of upper-outer quadrant of left female breast: Secondary | ICD-10-CM

## 2021-10-02 DIAGNOSIS — G894 Chronic pain syndrome: Secondary | ICD-10-CM

## 2021-10-03 ENCOUNTER — Telehealth: Payer: Self-pay | Admitting: Internal Medicine

## 2021-10-03 NOTE — Telephone Encounter (Signed)
Patient called in stating that the pharmacy informed her that they have tried several times to get in touch with Dr.Hernandez to refill meloxicam (MOBIC) 15 MG tablet [[id number]]  for patient but no answer. Patient stated that she just wanted to call in her self to request for the medication to be sent to the pharmacy.  Patient could be contacted at [phone].  Please advise.

## 2021-10-04 NOTE — Telephone Encounter (Signed)
Last OV 08/24/21 (acute concern).

## 2021-10-05 NOTE — Telephone Encounter (Signed)
Refill was sent 10/04/21

## 2021-10-09 ENCOUNTER — Encounter: Payer: Self-pay | Admitting: *Deleted

## 2021-10-22 ENCOUNTER — Other Ambulatory Visit: Payer: Self-pay | Admitting: Internal Medicine

## 2021-10-22 DIAGNOSIS — I10 Essential (primary) hypertension: Secondary | ICD-10-CM

## 2021-10-23 NOTE — Progress Notes (Signed)
Location of Breast Cancer:  Malignant neoplasm of upper-outer quadrant of left breast in female, estrogen receptor positive   Histology per Pathology Report:  09/26/2021 FINAL MICROSCOPIC DIAGNOSIS:  A. BREAST, LEFT, LUMPECTOMY:  - Invasive ductal carcinoma, 1 cm, grade 2  - Ductal carcinoma in situ, low-grade  - Resection margins are negative for carcinoma - closest is the superior  margin at 0.3 cm  - Atypical lobular hyperplasia  - Biopsy site changes  Receptor Status: ER(95%), PR (100%), Her2-neu (Negative via FISH), Ki-67(5%)  Did patient present with symptoms (if so, please note symptoms) or was this found on screening mammography?: Patient had a small mass seen in the left breast on screening mammography. By ultrasound it measured 8 mm. The axilla was unremarkable.   Past/Anticipated interventions by surgeon, if any:  09/26/2021 --Dr. Coralie Keens LEFT BREAST LUMPECTOMY WITH RADIOACTIVE SEED LOCALIZATION  Past/Anticipated interventions by medical oncology, if any:  Under care of Dr. Truitt Merle  09/18/2021 Given the early stage disease, she is scheduled for left lumpectomy on 09/26/21 under Dr. Ninfa Linden.   Due to her advanced age and early stage disease, sentinel lymph node biopsy will likely not performed Given her advanced age, she is not a candidate for chemotherapy, I do not recommend Oncotype. Giving the strong ER and PR expression in her postmenopausal status, I recommend adjuvant endocrine therapy with aromatase inhibitor or Tamoxifen for a total of 5-10 years to reduce the risk of cancer recurrence. Potential benefits and side effects were discussed with patient and she is interested.  Due to her moderate arthralgia and osteoporosis, tamoxifen is likely a better option for her. She will also see radiation oncologist Dr. Isidore Moos tomorrow. She will likely benefit from breast radiation if she undergo lumpectomy to decrease the risk of breast cancer.  we will obtain baseline  labs today. We also discussed the breast cancer surveillance after her surgery. She will continue annual screening mammogram, self exam, and a routine office visit with lab and exam with [place].  Lymphedema issues, if any:  Patient denies    Pain issues, if any:  Patient denies   SAFETY ISSUES: Prior radiation? No Pacemaker/ICD? No Possible current pregnancy? No--postmenopausal  Is the patient on methotrexate? No  Current Complaints / other details:  Nothing else of note

## 2021-10-23 NOTE — Progress Notes (Signed)
Radiation Oncology         [phone] ________________________________  Name: [name] MRN: [id number]  Date: 10/24/2021  DOB: Feb 21, 1941  Follow-Up Visit Note  Outpatient  CC: Isaac Bliss, Rayford Halsted, MD  Truitt Merle, MD  Diagnosis:      ICD-10-CM   1. Malignant neoplasm of upper-outer quadrant of left breast in female, estrogen receptor positive ([place])  C50.412    Z17.0         Cancer Staging  Malignant neoplasm of upper-outer quadrant of left breast in female, estrogen receptor positive ([place]) Staging form: Breast, AJCC 8th Edition - Clinical stage from 08/17/2021: Stage IA (cT1c, cN0, cM0, G2, ER+, PR+, HER2-) - Signed by Truitt Merle, MD on 09/18/2021 Stage prefix: Initial diagnosis Histologic grading system: 3 grade system  S/p left lumpectomy: Left Breast UOQ, Invasive ductal carcinoma and low-grade ductal carcinoma in-situ, ER+ / PR+ / Her2-, Grade 2  pT1b pNx  CHIEF COMPLAINT: Here to discuss management of left breast cancer  Narrative:  The patient returns today for follow-up.     Since consultation date of 09/19/21, the patient opted to proceed with left breast lumpectomy on 09/26/21 under the care of Dr. Ninfa Linden. Pathology from the procedure revealed: tumor size of 1 cm; histology of grade 2 invasive ductal carcinoma with low-grade ductal carcinoma in-situ and atypical lobular hyperplasia' all margins negative for both invasive and in-situ carcinoma; margin status to invasive disease of 3 mm from the superior margin; margin status to in situ disease of 5 mm from the posterior margin; no lymph nodes were examined;  ER status: 95% positive; PR status 100% positive (both with strong staining intensity), Her2 status negative; Proliferation marker Ki67 at 5%; Grade 2.   Symptomatically, the patient reports:  Lymphedema issues, if any:  Patient denies    Pain issues, if any:  Patient denies   SAFETY ISSUES: Prior radiation? No Pacemaker/ICD? No Possible  current pregnancy? No--postmenopausal  Is the patient on methotrexate? No  Current Complaints / other details:  Nothing else of note           ALLERGIES:  is allergic to [employer] tartrate], hydrochlorothiazide, prednisone, thiazide-type diuretics, and sulfamethoxazole.  Meds: Current Outpatient Medications  Medication Sig Dispense Refill   Accu-Chek FastClix Lancets MISC 1 each by Does not apply route daily. 100 each 12   ALPRAZolam (XANAX) 0.25 MG tablet TAKE ONE TABLET BY MOUTH TWICE DAILY AS NEEDED FOR ANXIETY 60 tablet 0   amLODipine (NORVASC) 5 MG tablet Take 1 tablet by mouth daily. 90 tablet 1   Artificial Tear Ointment (DRY EYES OP) Place 1 drop into both eyes at bedtime as needed (Dry eye).     Blood Glucose Monitoring Suppl (ACCU-CHEK GUIDE ME) w/Device KIT 1 each by Does not apply route daily. 1 kit 2   COVID-19 mRNA bivalent vaccine, Pfizer, (PFIZER COVID-19 VAC BIVALENT) injection Inject into the muscle. 0.3 mL 0   COVID-19 mRNA vaccine, Pfizer, 30 MCG/0.3ML injection Inject into the muscle. 0.3 mL 0   diclofenac Sodium (VOLTAREN) 1 % GEL Apply 4 g topically 4 (four) times daily. Apply to bilateral knees.     diphenhydramine-acetaminophen (TYLENOL PM) 25-500 MG TABS tablet Take 2 tablets by mouth at bedtime.     fluocinonide (LIDEX) 0.05 % external solution Apply 1 application topically daily as needed for itching.     fluticasone (CUTIVATE) 0.005 % ointment Apply 1 application topically daily.     glucose blood (ACCU-CHEK GUIDE) test  strip 1 each by Other route daily. Use as instructed 100 each 12   HYDROcodone-acetaminophen (NORCO/VICODIN) 5-325 MG tablet Take 1 tablet by mouth every 6 (six) hours as needed for moderate pain or severe pain. 20 tablet 0   ketoconazole (NIZORAL) 2 % shampoo Apply 1 application topically 3 (three) times a week.     lisinopril (ZESTRIL) 5 MG tablet TAKE ONE TABLET BY MOUTH IN THE MORNING AND TAKE ONE TABLET AT BEDTIME 180 tablet 1    meloxicam (MOBIC) 15 MG tablet TAKE ONE TABLET BY MOUTH DAILY 90 tablet 0   mometasone (NASONEX) 50 MCG/ACT nasal spray Place 2 sprays into the nose at bedtime. 17 g 6   oxybutynin (DITROPAN-XL) 10 MG 24 hr tablet Take 1 tablet (10 mg total) by mouth daily. 90 tablet 1   raNITIdine HCl (ZANTAC PO) Take 4 mg by mouth at bedtime.     Sennosides (EX-LAX PO) Take 1 tablet by mouth at bedtime.     simvastatin (ZOCOR) 20 MG tablet Take 1 tablet (20 mg total) by mouth at bedtime. 90 tablet 1   triamcinolone (NASACORT) 55 MCG/ACT AERO nasal inhaler Place 2 sprays into the nose daily.     triamcinolone cream (KENALOG) 0.1 % APPLY TO RASH SPARINGLY ONCE A DAY. 30 g 0   No current facility-administered medications for this encounter.    Physical Findings:  height is _0  (1.6 m) and weight is 172 lb 9.6 oz (78.3 kg). Her temperature is 97.6 F (36.4 C). Her blood pressure is 152/80 (abnormal) and her pulse is 88. Her respiration is 20 and oxygen saturation is 100%. .     General: Alert and oriented, in no acute distress Psychiatric: Judgment and insight are intact. Affect is appropriate. Breast exam reveals excellent healing at left lumpectomy site  Lab Findings: Lab Results  Component Value Date   WBC 9.3 09/18/2021   HGB 13.7 09/18/2021   HCT 40.0 09/18/2021   MCV 95.9 09/18/2021   PLT 288 09/18/2021      Radiographic Findings: MM Breast Surgical Specimen  Result Date: 09/26/2021 CLINICAL DATA:  Post lumpectomy specimen radiograph EXAM: SPECIMEN RADIOGRAPH OF THE LEFT BREAST COMPARISON:  Previous exam(s). FINDINGS: Status post excision of the left breast. The radioactive seed and biopsy marker clip are present, completely intact, and were marked for pathology. IMPRESSION: Specimen radiograph of the left breast. Electronically Signed   By: Audie Pinto M.D.   On: 09/26/2021 12:24  MM LT RADIOACTIVE SEED LOC MAMMO GUIDE  Result Date: 09/25/2021 CLINICAL DATA:  Radioactive seed  localization of the left breast prior to lumpectomy. EXAM: MAMMOGRAPHIC GUIDED RADIOACTIVE SEED LOCALIZATION OF THE LEFT BREAST COMPARISON:  Previous exam(s). FINDINGS: Patient presents for radioactive seed localization prior to left breast lumpectomy. I met with the patient and we discussed the procedure of seed localization including benefits and alternatives. We discussed the high likelihood of a successful procedure. We discussed the risks of the procedure including infection, bleeding, tissue injury and further surgery. We discussed the low dose of radioactivity involved in the procedure. Informed, written consent was given. The usual time-out protocol was performed immediately prior to the procedure. Using mammographic guidance, sterile technique, 1% lidocaine and an I-125 radioactive seed, the mass in the upper-outer quadrant of the left breast was localized using a superior approach. The follow-up mammogram images confirm the seed in the expected location and were marked for Dr. Ninfa Linden. Follow-up survey of the patient confirms presence of the radioactive seed. Order  number of I-125 seed:  [id number]. Total activity:  8.341 millicuries reference Date: 08/02/2021 The patient tolerated the procedure well and was released from the [place]. She was given instructions regarding seed removal. IMPRESSION: Radioactive seed localization left breast. No apparent complications. Electronically Signed   By: Ammie Ferrier M.D.   On: 09/25/2021 14:41   Impression/Plan: We discussed adjuvant radiotherapy today after reviewing her surgical results.  For the patient's early stage favorable risk breast cancer, we had a thorough discussion about her options for adjuvant therapy. One option would be antiestrogen therapy as discussed with medical oncology. She would take a pill for approximately 5 years. The alternative option (but less standard) would be radiotherapy to the breast. The most aggressive option would  be to pursue both modalities.  Of note, I discussed the data from the [employer] al trial in the [place] [facility]. She understands that tamoxifen compared to radiation plus tamoxifen demonstrated no survival benefit among the women in this study. The women were 107 years or older with stage I estrogen receptor positive breast cancer. Based on this study, I told the patient that her overall life expectancy should not be affected by adding radiotherapy to antiestrogen medication. She understands that the main benefit of  adding radiotherapy to anti estrogen therapy would be a very small but measurable local control benefit (risk of local recurrence to be lowered from ~9% --> ~2% over a decade).  We discussed the fact that radiotherapy only provides a local control benefit while anti-estrogen pills provide systemic coverage. That being said, the risk of systemic failure is relatively low with her type of breast cancer.  We discussed the risks benefits and side effects of radiotherapy. She understands that the side effects would likely include some skin irritation and fatigue during the weeks of radiation. There is a risk of late effects which include but are not necessarily limited to cosmetic changes and rare lung toxicity. I would anticipate delivering approximately 1-4 weeks of radiotherapy (she is interested in the [place] 1 week regimen).  After a thorough discussion, she wants to pursue the 1 week of ultra hypofractionated radiation therapy. She realizes this approach is a bit less standard than longer regimens but certainly more convenient. We spoke about acute effects including acute skin irritation and fatigue as well as skin telangiectases and breast fibrosis/permanent cosmetic issues long term, and much less common late effects including internal organ injury or irritation. We spoke about the latest technology that is used to minimize the risk of late effects for patients undergoing  radiotherapy to the breast or chest wall. No guarantees of treatment were given. The patient is enthusiastic about proceeding with treatment. Consent form  has been signed. .   Proceed with treatment planning today, start treatment next week.  On date of service, in total, I spent 30 minutes on this encounter. Patient was seen in person.  _____________________________________   Eppie Gibson, MD  This document serves as a record of services personally performed by Eppie Gibson, MD. It was created on her behalf by Roney Mans, a trained medical scribe. The creation of this record is based on the scribe's personal observations and the provider's statements to them. This document has been checked and approved by the attending provider.

## 2021-10-24 ENCOUNTER — Encounter: Payer: Self-pay | Admitting: Radiation Oncology

## 2021-10-24 ENCOUNTER — Ambulatory Visit
Admission: RE | Admit: 2021-10-24 | Discharge: 2021-10-24 | Disposition: A | Discharge status: Home / Self Care | Payer: Medicare Other | Source: Ambulatory Visit | Attending: Radiation Oncology | Admitting: Radiation Oncology

## 2021-10-24 ENCOUNTER — Other Ambulatory Visit: Payer: Self-pay

## 2021-10-24 VITALS — BP 152/80 | HR 88 | Temp 97.6°F | Resp 20 | Ht 63.0 in | Wt 172.6 lb

## 2021-10-24 DIAGNOSIS — Z510 Encounter for antineoplastic radiation therapy: Secondary | ICD-10-CM | POA: Diagnosis not present

## 2021-10-24 DIAGNOSIS — Z170 Estrogen receptor positive status [ER+]: Secondary | ICD-10-CM

## 2021-10-24 DIAGNOSIS — C50412 Malignant neoplasm of upper-outer quadrant of left female breast: Secondary | ICD-10-CM | POA: Insufficient documentation

## 2021-10-24 DIAGNOSIS — Z79899 Other long term (current) drug therapy: Secondary | ICD-10-CM | POA: Diagnosis not present

## 2021-10-24 DIAGNOSIS — Z791 Long term (current) use of non-steroidal anti-inflammatories (NSAID): Secondary | ICD-10-CM | POA: Diagnosis not present

## 2021-10-24 DIAGNOSIS — Z7951 Long term (current) use of inhaled steroids: Secondary | ICD-10-CM | POA: Insufficient documentation

## 2021-10-27 DIAGNOSIS — Z510 Encounter for antineoplastic radiation therapy: Secondary | ICD-10-CM | POA: Diagnosis not present

## 2021-10-27 DIAGNOSIS — Z170 Estrogen receptor positive status [ER+]: Secondary | ICD-10-CM | POA: Diagnosis not present

## 2021-10-27 DIAGNOSIS — C50412 Malignant neoplasm of upper-outer quadrant of left female breast: Secondary | ICD-10-CM | POA: Diagnosis not present

## 2021-10-30 ENCOUNTER — Other Ambulatory Visit: Payer: Self-pay

## 2021-10-30 ENCOUNTER — Ambulatory Visit
Admission: RE | Admit: 2021-10-30 | Discharge: 2021-10-30 | Disposition: A | Discharge status: Home / Self Care | Payer: Medicare Other | Source: Ambulatory Visit | Attending: Radiation Oncology | Admitting: Radiation Oncology

## 2021-10-30 DIAGNOSIS — C50412 Malignant neoplasm of upper-outer quadrant of left female breast: Secondary | ICD-10-CM

## 2021-10-30 DIAGNOSIS — Z170 Estrogen receptor positive status [ER+]: Secondary | ICD-10-CM | POA: Diagnosis not present

## 2021-10-30 DIAGNOSIS — Z510 Encounter for antineoplastic radiation therapy: Secondary | ICD-10-CM | POA: Diagnosis not present

## 2021-10-30 MED ORDER — RADIAPLEXRX EX GEL: Freq: Once | CUTANEOUS | Status: DC

## 2021-10-31 ENCOUNTER — Encounter: Payer: Self-pay | Admitting: *Deleted

## 2021-10-31 ENCOUNTER — Other Ambulatory Visit: Payer: Self-pay | Admitting: Internal Medicine

## 2021-10-31 ENCOUNTER — Ambulatory Visit
Admission: RE | Admit: 2021-10-31 | Discharge: 2021-10-31 | Disposition: A | Discharge status: Home / Self Care | Payer: Medicare Other | Source: Ambulatory Visit | Attending: Radiation Oncology | Admitting: Radiation Oncology

## 2021-10-31 DIAGNOSIS — C50412 Malignant neoplasm of upper-outer quadrant of left female breast: Secondary | ICD-10-CM | POA: Diagnosis not present

## 2021-10-31 DIAGNOSIS — F411 Generalized anxiety disorder: Secondary | ICD-10-CM

## 2021-10-31 DIAGNOSIS — Z170 Estrogen receptor positive status [ER+]: Secondary | ICD-10-CM | POA: Diagnosis not present

## 2021-10-31 DIAGNOSIS — Z510 Encounter for antineoplastic radiation therapy: Secondary | ICD-10-CM | POA: Diagnosis not present

## 2021-11-01 ENCOUNTER — Ambulatory Visit
Admission: RE | Admit: 2021-11-01 | Discharge: 2021-11-01 | Disposition: A | Discharge status: Home / Self Care | Payer: Medicare Other | Source: Ambulatory Visit | Attending: Radiation Oncology | Admitting: Radiation Oncology

## 2021-11-01 ENCOUNTER — Other Ambulatory Visit: Payer: Self-pay

## 2021-11-01 ENCOUNTER — Telehealth: Payer: Self-pay

## 2021-11-01 DIAGNOSIS — Z510 Encounter for antineoplastic radiation therapy: Secondary | ICD-10-CM | POA: Insufficient documentation

## 2021-11-01 DIAGNOSIS — C50412 Malignant neoplasm of upper-outer quadrant of left female breast: Secondary | ICD-10-CM | POA: Insufficient documentation

## 2021-11-01 DIAGNOSIS — Z170 Estrogen receptor positive status [ER+]: Secondary | ICD-10-CM | POA: Diagnosis not present

## 2021-11-01 NOTE — Telephone Encounter (Signed)
Called to check on patient. She reported her husband was still in the ED waiting to be admitted, so she was going to go home and check on their two dogs. Informed her that Dr. Isidore Moos felt comfortable allowing her to hold her last 2 radiation treatments until next Monday/Tuesday given everything patient is having to manage currently. Patient verbalized appreciation, but stated she would prefer to keep appointments as they are and finish this week. Provided her with my direct call back number should she change her mind, or have any additional questions/concerns. Patient verbalized understanding and denied any other needs at this time ?

## 2021-11-02 ENCOUNTER — Ambulatory Visit
Admission: RE | Admit: 2021-11-02 | Discharge: 2021-11-02 | Disposition: A | Discharge status: Home / Self Care | Payer: Medicare Other | Source: Ambulatory Visit | Attending: Radiation Oncology | Admitting: Radiation Oncology

## 2021-11-02 ENCOUNTER — Encounter: Payer: Self-pay | Admitting: *Deleted

## 2021-11-02 DIAGNOSIS — Z510 Encounter for antineoplastic radiation therapy: Secondary | ICD-10-CM | POA: Diagnosis not present

## 2021-11-02 DIAGNOSIS — C50412 Malignant neoplasm of upper-outer quadrant of left female breast: Secondary | ICD-10-CM

## 2021-11-02 DIAGNOSIS — Z170 Estrogen receptor positive status [ER+]: Secondary | ICD-10-CM | POA: Diagnosis not present

## 2021-11-03 ENCOUNTER — Encounter: Payer: Self-pay | Admitting: Radiation Oncology

## 2021-11-03 ENCOUNTER — Ambulatory Visit
Admission: RE | Admit: 2021-11-03 | Discharge: 2021-11-03 | Disposition: A | Discharge status: Home / Self Care | Payer: Medicare Other | Source: Ambulatory Visit | Attending: Radiation Oncology | Admitting: Radiation Oncology

## 2021-11-03 ENCOUNTER — Other Ambulatory Visit: Payer: Self-pay

## 2021-11-03 DIAGNOSIS — Z510 Encounter for antineoplastic radiation therapy: Secondary | ICD-10-CM | POA: Diagnosis not present

## 2021-11-03 DIAGNOSIS — C50412 Malignant neoplasm of upper-outer quadrant of left female breast: Secondary | ICD-10-CM | POA: Diagnosis not present

## 2021-11-03 DIAGNOSIS — Z170 Estrogen receptor positive status [ER+]: Secondary | ICD-10-CM | POA: Diagnosis not present

## 2021-11-08 ENCOUNTER — Encounter (HOSPITAL_COMMUNITY): Payer: Self-pay

## 2021-11-10 ENCOUNTER — Inpatient Hospital Stay: Payer: Medicare Other | Attending: Hematology | Admitting: Hematology

## 2021-11-10 ENCOUNTER — Encounter: Payer: Self-pay | Admitting: Hematology

## 2021-11-10 ENCOUNTER — Other Ambulatory Visit: Payer: Self-pay

## 2021-11-10 VITALS — BP 154/75 | HR 96 | Temp 98.5°F | Resp 18 | Wt 175.5 lb

## 2021-11-10 DIAGNOSIS — K219 Gastro-esophageal reflux disease without esophagitis: Secondary | ICD-10-CM | POA: Insufficient documentation

## 2021-11-10 DIAGNOSIS — E785 Hyperlipidemia, unspecified: Secondary | ICD-10-CM | POA: Insufficient documentation

## 2021-11-10 DIAGNOSIS — Z79899 Other long term (current) drug therapy: Secondary | ICD-10-CM | POA: Diagnosis not present

## 2021-11-10 DIAGNOSIS — M2550 Pain in unspecified joint: Secondary | ICD-10-CM | POA: Diagnosis not present

## 2021-11-10 DIAGNOSIS — Z888 Allergy status to other drugs, medicaments and biological substances status: Secondary | ICD-10-CM | POA: Insufficient documentation

## 2021-11-10 DIAGNOSIS — E119 Type 2 diabetes mellitus without complications: Secondary | ICD-10-CM | POA: Diagnosis not present

## 2021-11-10 DIAGNOSIS — Z170 Estrogen receptor positive status [ER+]: Secondary | ICD-10-CM | POA: Diagnosis not present

## 2021-11-10 DIAGNOSIS — C50412 Malignant neoplasm of upper-outer quadrant of left female breast: Secondary | ICD-10-CM | POA: Diagnosis not present

## 2021-11-10 DIAGNOSIS — M810 Age-related osteoporosis without current pathological fracture: Secondary | ICD-10-CM | POA: Diagnosis not present

## 2021-11-10 DIAGNOSIS — I10 Essential (primary) hypertension: Secondary | ICD-10-CM | POA: Diagnosis not present

## 2021-11-10 DIAGNOSIS — Z882 Allergy status to sulfonamides status: Secondary | ICD-10-CM | POA: Insufficient documentation

## 2021-11-10 DIAGNOSIS — G894 Chronic pain syndrome: Secondary | ICD-10-CM | POA: Diagnosis not present

## 2021-11-10 MED ORDER — TAMOXIFEN CITRATE 20 MG PO TABS: 20.0000 mg | ORAL_TABLET | Freq: Every day | ORAL | 3 refills | Status: DC

## 2021-11-10 NOTE — Progress Notes (Signed)
[place]   Telephone:([phone] Fax:([phone]) [phone]   Clinic Follow up Note   Patient Care Team: Isaac Bliss, Rayford Halsted, MD as PCP - General (Internal Medicine) Coralie Keens, MD as Consulting Physician (General Surgery) Truitt Merle, MD as Consulting Physician (Hematology) Eppie Gibson, MD as Attending Physician (Radiation Oncology) Rutherford Guys, MD as Consulting Physician (Ophthalmology) Rockwell Germany, RN as Oncology Nurse Navigator Mauro Kaufmann, RN as Oncology Nurse Navigator  Date of Service:  11/10/2021  CHIEF COMPLAINT: f/u of left breast cancer  CURRENT THERAPY:  To start tamoxifen  ASSESSMENT & PLAN:  [name] is a 81 y.o. female with   1. Malignant neoplasm of upper-outer quadrant of left breast, invasive lobular carcinoma, stage IA, pT1b, cN0, ER+/PR+/HER2-, Grade 2  -found on screening mammogram. Biopsy on 08/17/21 showed invasive lobular carcinoma, 1.1cm, grade 1/2. -left lumpectomy on 09/26/21 by Dr. Ninfa Linden showed: 1 cm invasive ductal carcinoma; low grade DCIS; [place]. Margins negative. -she received ultra-hypofractionated radiation therapy under Dr. Isidore Moos 2/27-11/03/21. -Giving the strong ER and PR expression in her postmenopausal status, I recommend adjuvant endocrine therapy with aromatase inhibitor or Tamoxifen for a total of 5 years to reduce the risk of cancer recurrence. Potential benefits and side effects were discussed with patient and she is interested. Due to her moderate arthralgia and osteoporosis, tamoxifen is likely a better option for her. I advised her to begin in 3-4 weeks, as she is dealing with taking care of her husband's recovery. -We also discussed the breast cancer surveillance after her surgery. She will continue annual screening mammogram, self exam, and a routine office visit with lab and exam with [place]. -I encouraged her to have healthy diet and exercise regularly.    2. Bone Health  -Her most recent DEXA  was 09/10/18 showing osteoporosis (T-score -4.0) at distal radius -she notes she has not received Prolia due to high cost.  I recommend her to consider Zometa, which also increased risk of bone metastasis from breast cancer. We previously discussed potential benefit and side effects.  -she is due for repeat, which she has delayed secondary to treatment and her husband's health.   3. Chronic Pain Syndrome -managed by her PCP, Dr. Isaac Bliss -she takes hydrocodone 5/325 mg q8hr     PLAN:  -start tamoxifen in 3-4 weeks -survivorship in 3 months -lab and f/u in 6 months   No problem-specific Assessment & Plan notes found for this encounter.   SUMMARY OF ONCOLOGIC HISTORY: Oncology History Overview Note   Cancer Staging  Malignant neoplasm of upper-outer quadrant of left breast in female, estrogen receptor positive ([place]) Staging form: Breast, AJCC 8th Edition - Clinical stage from 08/17/2021: Stage IA (cT1c, cN0, cM0, G2, ER+, PR+, HER2-) - Signed by Truitt Merle, MD on 09/18/2021    Malignant neoplasm of upper-outer quadrant of left breast in female, estrogen receptor positive ([place])  08/09/2021 Mammogram   EXAM: DIGITAL DIAGNOSTIC UNILATERAL LEFT MAMMOGRAM WITH TOMOSYNTHESIS AND CAD; ULTRASOUND LEFT BREAST LIMITED  IMPRESSION: 1.  There is a suspicious mass in the left breast at 1 o'clock.   2.  No evidence of left axillary lymphadenopathy.   08/17/2021 Cancer Staging   Staging form: Breast, AJCC 8th Edition - Clinical stage from 08/17/2021: Stage IA (cT1c, cN0, cM0, G2, ER+, PR+, HER2-) - Signed by Truitt Merle, MD on 09/18/2021 Stage prefix: Initial diagnosis Histologic grading system: 3 grade system    08/17/2021 Initial Biopsy   Diagnosis Breast, left, needle core  biopsy, left breast 1:00, same - INVASIVE MAMMARY CARCINOMA, GRADE 1/2. - SEE MICROSCOPIC DESCRIPTION. Microscopic Comment The greatest tumor dimension is 1.1 cm .  Addendum: Immunohistochemistry for E-cadherin  is negative consistent with lobular carcinoma.  PROGNOSTIC INDICATORS Results: The tumor cells are EQUIVOCAL for Her2 (2+). Her2 by FISH will be performed and results reported separately. Estrogen Receptor: 95%, POSITIVE, STRONG STAINING INTENSITY Progesterone Receptor: 100%, POSITIVE, STRONG STAINING INTENSITY Proliferation Marker Ki67: 5%  FLUORESCENCE IN-SITU HYBRIDIZATION Results: GROUP 5: HER2 **NEGATIVE**   09/18/2021 Initial Diagnosis   Malignant neoplasm of upper-outer quadrant of left breast in female, estrogen receptor positive ([place])   09/26/2021 Cancer Staging   Staging form: Breast, AJCC 8th Edition - Pathologic stage from 09/26/2021: Stage IA (pT1b, pN0, cM0, G2, ER+, PR+, HER2-) - Signed by Truitt Merle, MD on 11/10/2021 Stage prefix: Initial diagnosis Histologic grading system: 3 grade system Residual tumor (R): R0 - None    09/26/2021 Definitive Surgery   FINAL MICROSCOPIC DIAGNOSIS:   A. BREAST, LEFT, LUMPECTOMY:  - Invasive ductal carcinoma, 1 cm, grade 2  - Ductal carcinoma in situ, low-grade  - Resection margins are negative for carcinoma - closest is the superior margin at 0.3 cm  - Atypical lobular hyperplasia  - Biopsy site changes  - See oncology table    10/30/2021 - 11/03/2021 Radiation Therapy         INTERVAL HISTORY:  [name] is here for a follow up of breast cancer. She was last seen by me on 09/18/21 in consultation. She presents to the clinic alone. She tells me her husband recently had a massive heart attack and is going to hospice.    All other systems were reviewed with the patient and are negative.  MEDICAL HISTORY:  Past Medical History:  Diagnosis Date   Allergy    Anxiety    Arthritis    PAIN AND OA RIGHT KNEE; PAIN AND DDD AND SPINAL STENOSIS   Complication of anesthesia    PULLED OUT MY IV'S AND FOLEY - HALLUCINATIONS WITH HIATAL HERNIA SURGERY   Depression    Diabetes mellitus without complication ([place])     Diverticulosis    PER COLONOSCOPY   GERD (gastroesophageal reflux disease)    OVER THE COUNTER MEDS IF NEEDED   Heart murmur    "VERY SLIGHT ALL MY LIFE"   Hyperlipidemia    Hypertension    Sleep apnea    does not tolerate CPAP - HAS NOT USED IN OVER 2 YRS    SURGICAL HISTORY: Past Surgical History:  Procedure Laterality Date   BACK SURGERY     BREAST LUMPECTOMY WITH RADIOACTIVE SEED LOCALIZATION Left 09/26/2021   Procedure: LEFT BREAST LUMPECTOMY WITH RADIOACTIVE SEED LOCALIZATION;  Surgeon: Coralie Keens, MD;  Location: [facility];  Service: General;  Laterality: Left;   COLONOSCOPY     EYE SURGERY     bilateral cataracts   HIATAL HERNIA REPAIR  09/03/2006   LUMBAR FUSION     LUMBAR LAMINECTOMY     PARTIAL KNEE ARTHROPLASTY Right 07/02/2014   Procedure: RIGHT MEDIAL UNICOMPARTMENTAL KNEE;  Surgeon: Mauri Pole, MD;  Location: [facility];  Service: Orthopedics;  Laterality: Right;   UPPER GASTROINTESTINAL ENDOSCOPY      I have reviewed the social history and family history with the patient and they are unchanged from previous note.  ALLERGIES:  is allergic to [employer] tartrate], hydrochlorothiazide, prednisone, thiazide-type diuretics, and sulfamethoxazole.  MEDICATIONS:  Current Outpatient Medications  Medication Sig  Dispense Refill   tamoxifen (NOLVADEX) 20 MG tablet Take 1 tablet (20 mg total) by mouth daily. 30 tablet 3   Accu-Chek FastClix Lancets MISC 1 each by Does not apply route daily. 100 each 12   ALPRAZolam (XANAX) 0.25 MG tablet TAKE ONE TABLET BY MOUTH TWICE DAILY AS NEEDED FOR ANXIETY 60 tablet 0   amLODipine (NORVASC) 5 MG tablet Take 1 tablet by mouth daily. 90 tablet 1   Artificial Tear Ointment (DRY EYES OP) Place 1 drop into both eyes at bedtime as needed (Dry eye).     Blood Glucose Monitoring Suppl (ACCU-CHEK GUIDE ME) w/Device KIT 1 each by Does not apply route daily. 1 kit 2   COVID-19 mRNA bivalent vaccine, Pfizer, (PFIZER COVID-19 VAC  BIVALENT) injection Inject into the muscle. 0.3 mL 0   COVID-19 mRNA vaccine, Pfizer, 30 MCG/0.3ML injection Inject into the muscle. 0.3 mL 0   diclofenac Sodium (VOLTAREN) 1 % GEL Apply 4 g topically 4 (four) times daily. Apply to bilateral knees.     diphenhydramine-acetaminophen (TYLENOL PM) 25-500 MG TABS tablet Take 2 tablets by mouth at bedtime.     fluocinonide (LIDEX) 0.05 % external solution Apply 1 application topically daily as needed for itching.     fluticasone (CUTIVATE) 0.005 % ointment Apply 1 application topically daily.     glucose blood (ACCU-CHEK GUIDE) test strip 1 each by Other route daily. Use as instructed 100 each 12   HYDROcodone-acetaminophen (NORCO/VICODIN) 5-325 MG tablet Take 1 tablet by mouth every 6 (six) hours as needed for moderate pain or severe pain. 20 tablet 0   ketoconazole (NIZORAL) 2 % shampoo Apply 1 application topically 3 (three) times a week.     lisinopril (ZESTRIL) 5 MG tablet TAKE ONE TABLET BY MOUTH IN THE MORNING AND TAKE ONE TABLET AT BEDTIME 180 tablet 1   meloxicam (MOBIC) 15 MG tablet TAKE ONE TABLET BY MOUTH DAILY 90 tablet 0   mometasone (NASONEX) 50 MCG/ACT nasal spray Place 2 sprays into the nose at bedtime. 17 g 6   oxybutynin (DITROPAN-XL) 10 MG 24 hr tablet Take 1 tablet (10 mg total) by mouth daily. 90 tablet 1   raNITIdine HCl (ZANTAC PO) Take 4 mg by mouth at bedtime.     Sennosides (EX-LAX PO) Take 1 tablet by mouth at bedtime.     simvastatin (ZOCOR) 20 MG tablet Take 1 tablet (20 mg total) by mouth at bedtime. 90 tablet 1   triamcinolone (NASACORT) 55 MCG/ACT AERO nasal inhaler Place 2 sprays into the nose daily.     triamcinolone cream (KENALOG) 0.1 % APPLY TO RASH SPARINGLY ONCE A DAY. 30 g 0   No current facility-administered medications for this visit.    PHYSICAL EXAMINATION: ECOG PERFORMANCE STATUS: 1 - Symptomatic but completely ambulatory  Vitals:   11/10/21 1004  BP: (!) 154/75  Pulse: 96  Resp: 18  Temp: 98.5  F (36.9 C)  SpO2: 98%   Wt Readings from Last 3 Encounters:  11/10/21 175 lb 8 oz (79.6 kg)  10/24/21 172 lb 9.6 oz (78.3 kg)  09/26/21 169 lb (76.7 kg)     GENERAL:alert, no distress and comfortable SKIN: skin color normal, no rashes or significant lesions EYES: normal, Conjunctiva are pink and non-injected, sclera clear  NEURO: alert & oriented x 3 with fluent speech  LABORATORY DATA:  I have reviewed the data as listed CBC Latest Ref Rng & Units 09/18/2021 02/16/2021 12/11/2019  WBC 4.0 - 10.5 K/uL 9.3  5.8 6.3  Hemoglobin 12.0 - 15.0 g/dL 13.7 13.6 13.7  Hematocrit 36.0 - 46.0 % 40.0 40.3 40.4  Platelets 150 - 400 K/uL 288 231.0 272.0     CMP Latest Ref Rng & Units 09/18/2021 02/16/2021 12/11/2019  Glucose 70 - 99 mg/dL 117(H) 110(H) 125(H)  BUN 8 - 23 mg/dL $Remove'17 17 19  'JgGQeoZ$ Creatinine 0.44 - 1.00 mg/dL 0.70 0.78 0.61  Sodium 135 - 145 mmol/L 139 137 140  Potassium 3.5 - 5.1 mmol/L 3.9 3.8 4.3  Chloride 98 - 111 mmol/L 105 102 102  CO2 22 - 32 mmol/L $RemoveB'25 25 27  'gbSVVzbW$ Calcium 8.9 - 10.3 mg/dL 9.8 9.6 9.5  Total Protein 6.5 - 8.1 g/dL 7.7 7.3 6.6  Total Bilirubin 0.3 - 1.2 mg/dL 0.5 0.7 0.6  Alkaline Phos 38 - 126 U/L 44 36(L) 43  AST 15 - 41 U/L $Remo'15 11 16  'rlree$ ALT 0 - 44 U/L $Remo'10 7 13      'GdiAH$ RADIOGRAPHIC STUDIES: I have personally reviewed the radiological images as listed and agreed with the findings in the report. No results found.    No orders of the defined types were placed in this encounter.  All questions were answered. The patient knows to call the clinic with any problems, questions or concerns. No barriers to learning was detected. The total time spent in the appointment was 30 minutes.     Truitt Merle, MD 11/10/2021   I, Wilburn Mylar, am acting as scribe for Truitt Merle, MD.   I have reviewed the above documentation for accuracy and completeness, and I agree with the above.

## 2021-11-22 ENCOUNTER — Ambulatory Visit (INDEPENDENT_AMBULATORY_CARE_PROVIDER_SITE_OTHER): Payer: Medicare Other | Admitting: Internal Medicine

## 2021-11-22 ENCOUNTER — Encounter: Payer: Self-pay | Admitting: Internal Medicine

## 2021-11-22 ENCOUNTER — Telehealth: Payer: Self-pay | Admitting: Internal Medicine

## 2021-11-22 VITALS — BP 130/80 | HR 88 | Temp 99.2°F | Wt 176.3 lb

## 2021-11-22 DIAGNOSIS — Z170 Estrogen receptor positive status [ER+]: Secondary | ICD-10-CM | POA: Diagnosis not present

## 2021-11-22 DIAGNOSIS — C50412 Malignant neoplasm of upper-outer quadrant of left female breast: Secondary | ICD-10-CM | POA: Diagnosis not present

## 2021-11-22 DIAGNOSIS — E785 Hyperlipidemia, unspecified: Secondary | ICD-10-CM

## 2021-11-22 DIAGNOSIS — I10 Essential (primary) hypertension: Secondary | ICD-10-CM

## 2021-11-22 DIAGNOSIS — R7303 Prediabetes: Secondary | ICD-10-CM

## 2021-11-22 DIAGNOSIS — G894 Chronic pain syndrome: Secondary | ICD-10-CM

## 2021-11-22 DIAGNOSIS — Z79899 Other long term (current) drug therapy: Secondary | ICD-10-CM | POA: Diagnosis not present

## 2021-11-22 LAB — POCT GLYCOSYLATED HEMOGLOBIN (HGB A1C): Hemoglobin A1C: 5.9 % — AB (ref 4.0–5.6)

## 2021-11-22 MED ORDER — HYDROCODONE-ACETAMINOPHEN 5-325 MG PO TABS: 1.0000 | ORAL_TABLET | Freq: Three times a day (TID) | ORAL | 0 refills | Status: DC | PRN

## 2021-11-22 NOTE — Telephone Encounter (Signed)
Patient received a call from pharmacy stating that they no longer sell HYDROcodone-acetaminophen (NORCO/VICODIN) 5-325 MG tablet [[id number]] so patient wants to know what else could she be prescribed in place of this. ? ?Patient could be contacted at [phone]. ? ?Please advise. ?

## 2021-11-22 NOTE — Progress Notes (Signed)
? ? ? ?Established Patient Office Visit ? ? ? ? ?This visit occurred during the SARS-CoV-2 public health emergency.  Safety protocols were in place, including screening questions prior to the visit, additional usage of staff PPE, and extensive cleaning of exam room while observing appropriate contact time as indicated for disinfecting solutions.  ? ? ?CC/Reason for Visit: Follow-up chronic medical conditions ? ?HPI: [name] is a 81 y.o. female who is coming in today for the above mentioned reasons. Past Medical History is significant for: Hypertension, hyperlipidemia, obstructive sleep apnea, chronic pain syndrome on hydrocodone, recent diagnosis of left breast cancer status postlumpectomy, radiation and will be starting tamoxifen soon.  She is also the primary caregiver for her husband who lately suffered an MI. ? ? ?Past Medical/Surgical History: ?Past Medical History:  ?Diagnosis Date  ? Allergy   ? Anxiety   ? Arthritis   ? PAIN AND OA RIGHT KNEE; PAIN AND DDD AND SPINAL STENOSIS  ? Complication of anesthesia   ? PULLED OUT MY IV'S AND FOLEY - HALLUCINATIONS WITH HIATAL HERNIA SURGERY  ? Depression   ? Diabetes mellitus without complication ([place])   ? Diverticulosis   ? PER COLONOSCOPY  ? GERD (gastroesophageal reflux disease)   ? OVER THE COUNTER MEDS IF NEEDED  ? Heart murmur   ? "VERY SLIGHT ALL MY LIFE"  ? Hyperlipidemia   ? Hypertension   ? Sleep apnea   ? does not tolerate CPAP - HAS NOT USED IN OVER 2 YRS  ? ? ?Past Surgical History:  ?Procedure Laterality Date  ? BACK SURGERY    ? BREAST LUMPECTOMY WITH RADIOACTIVE SEED LOCALIZATION Left 09/26/2021  ? Procedure: LEFT BREAST LUMPECTOMY WITH RADIOACTIVE SEED LOCALIZATION;  Surgeon: Coralie Keens, MD;  Location: [facility];  Service: General;  Laterality: Left;  ? COLONOSCOPY    ? EYE SURGERY    ? bilateral cataracts  ? HIATAL HERNIA REPAIR  09/03/2006  ? LUMBAR FUSION    ? LUMBAR LAMINECTOMY    ? PARTIAL KNEE ARTHROPLASTY Right 07/02/2014  ?  Procedure: RIGHT MEDIAL UNICOMPARTMENTAL KNEE;  Surgeon: Mauri Pole, MD;  Location: [facility];  Service: Orthopedics;  Laterality: Right;  ? UPPER GASTROINTESTINAL ENDOSCOPY    ? ? ?Social History: ? reports that she has never smoked. She has never used smokeless tobacco. She reports that she does not drink alcohol and does not use drugs. ? ?Allergies: ?Allergies  ?Allergen Reactions  ? Ambien [Zolpidem Tartrate]   ?  amnesia  ? Hydrochlorothiazide   ?  Other reaction(s): Lethargy (intolerance)  ? Prednisone   ?  Rapid heart rate  ? Thiazide-Type Diuretics Other (See Comments)  ?  "could not physically move"  ? Sulfamethoxazole Rash  ? ? ?Family History:  ?Family History  ?Problem Relation Age of Onset  ? Heart disease Mother   ? Breast cancer Mother   ? Heart disease Father   ? Heart disease Sister   ? Pulmonary embolism Sister   ? Diabetes Paternal Grandfather   ? ? ? ?Current Outpatient Medications:  ?  Accu-Chek FastClix Lancets MISC, 1 each by Does not apply route daily., Disp: 100 each, Rfl: 12 ?  ALPRAZolam (XANAX) 0.25 MG tablet, TAKE ONE TABLET BY MOUTH TWICE DAILY AS NEEDED FOR ANXIETY, Disp: 60 tablet, Rfl: 0 ?  amLODipine (NORVASC) 5 MG tablet, Take 1 tablet by mouth daily., Disp: 90 tablet, Rfl: 1 ?  Artificial Tear Ointment (DRY EYES OP), Place 1 drop into both  eyes at bedtime as needed (Dry eye)., Disp: , Rfl:  ?  Blood Glucose Monitoring Suppl (ACCU-CHEK GUIDE ME) w/Device KIT, 1 each by Does not apply route daily., Disp: 1 kit, Rfl: 2 ?  COVID-19 mRNA bivalent vaccine, Pfizer, (PFIZER COVID-19 VAC BIVALENT) injection, Inject into the muscle., Disp: 0.3 mL, Rfl: 0 ?  COVID-19 mRNA vaccine, Pfizer, 30 MCG/0.3ML injection, Inject into the muscle., Disp: 0.3 mL, Rfl: 0 ?  diclofenac Sodium (VOLTAREN) 1 % GEL, Apply 4 g topically 4 (four) times daily. Apply to bilateral knees., Disp: , Rfl:  ?  diphenhydramine-acetaminophen (TYLENOL PM) 25-500 MG TABS tablet, Take 2 tablets by mouth at bedtime., Disp: ,  Rfl:  ?  fluocinonide (LIDEX) 0.05 % external solution, Apply 1 application topically daily as needed for itching., Disp: , Rfl:  ?  fluticasone (CUTIVATE) 0.005 % ointment, Apply 1 application topically daily., Disp: , Rfl:  ?  glucose blood (ACCU-CHEK GUIDE) test strip, 1 each by Other route daily. Use as instructed, Disp: 100 each, Rfl: 12 ?  HYDROcodone-acetaminophen (NORCO/VICODIN) 5-325 MG tablet, Take 1 tablet by mouth 3 (three) times daily as needed for moderate pain., Disp: 90 tablet, Rfl: 0 ?  HYDROcodone-acetaminophen (NORCO/VICODIN) 5-325 MG tablet, Take 1 tablet by mouth 3 (three) times daily as needed for moderate pain., Disp: 90 tablet, Rfl: 0 ?  ketoconazole (NIZORAL) 2 % shampoo, Apply 1 application topically 3 (three) times a week., Disp: , Rfl:  ?  lisinopril (ZESTRIL) 5 MG tablet, TAKE ONE TABLET BY MOUTH IN THE MORNING AND TAKE ONE TABLET AT BEDTIME, Disp: 180 tablet, Rfl: 1 ?  meloxicam (MOBIC) 15 MG tablet, TAKE ONE TABLET BY MOUTH DAILY, Disp: 90 tablet, Rfl: 0 ?  mometasone (NASONEX) 50 MCG/ACT nasal spray, Place 2 sprays into the nose at bedtime., Disp: 17 g, Rfl: 6 ?  oxybutynin (DITROPAN-XL) 10 MG 24 hr tablet, Take 1 tablet (10 mg total) by mouth daily., Disp: 90 tablet, Rfl: 1 ?  raNITIdine HCl (ZANTAC PO), Take 4 mg by mouth at bedtime., Disp: , Rfl:  ?  Sennosides (EX-LAX PO), Take 1 tablet by mouth at bedtime., Disp: , Rfl:  ?  simvastatin (ZOCOR) 20 MG tablet, Take 1 tablet (20 mg total) by mouth at bedtime., Disp: 90 tablet, Rfl: 1 ?  tamoxifen (NOLVADEX) 20 MG tablet, Take 1 tablet (20 mg total) by mouth daily., Disp: 30 tablet, Rfl: 3 ?  triamcinolone (NASACORT) 55 MCG/ACT AERO nasal inhaler, Place 2 sprays into the nose daily., Disp: , Rfl:  ?  triamcinolone cream (KENALOG) 0.1 %, APPLY TO RASH SPARINGLY ONCE A DAY., Disp: 30 g, Rfl: 0 ?  HYDROcodone-acetaminophen (NORCO/VICODIN) 5-325 MG tablet, Take 1 tablet by mouth 3 (three) times daily as needed for moderate pain or severe  pain., Disp: 90 tablet, Rfl: 0 ? ?Review of Systems:  ?Constitutional: Denies fever, chills, diaphoresis, appetite change and fatigue.  ?HEENT: Denies photophobia, eye pain, redness, hearing loss, ear pain, congestion, sore throat, rhinorrhea, sneezing, mouth sores, trouble swallowing, neck pain, neck stiffness and tinnitus.   ?Respiratory: Denies SOB, DOE, cough, chest tightness,  and wheezing.   ?Cardiovascular: Denies chest pain, palpitations and leg swelling.  ?Gastrointestinal: Denies nausea, vomiting, abdominal pain, diarrhea, constipation, blood in stool and abdominal distention.  ?Genitourinary: Denies dysuria, urgency, frequency, hematuria, flank pain and difficulty urinating.  ?Endocrine: Denies: hot or cold intolerance, sweats, changes in hair or nails, polyuria, polydipsia. ?Musculoskeletal: Denies myalgias, back pain, joint swelling, arthralgias and gait problem.  ?Skin:  Denies pallor, rash and wound.  ?Neurological: Denies dizziness, seizures, syncope, weakness, light-headedness, numbness and headaches.  ?Hematological: Denies adenopathy. Easy bruising, personal or family bleeding history  ?Psychiatric/Behavioral: Denies suicidal ideation, mood changes, confusion, nervousness, sleep disturbance and agitation ? ? ? ?Physical Exam: ?Vitals:  ? 11/22/21 1313  ?BP: 130/80  ?Pulse: 88  ?Temp: 99.2 ?F (37.3 ?C)  ?TempSrc: Oral  ?SpO2: 99%  ?Weight: 176 lb 4.8 oz (80 kg)  ? ? ?Body mass index is 31.23 kg/m?. ? ? ?Constitutional: NAD, calm, comfortable ?Eyes: PERRL, lids and conjunctivae normal, wears corrective lenses ?ENMT: Mucous membranes are moist.  ?Respiratory: clear to auscultation bilaterally, no wheezing, no crackles. Normal respiratory effort. No accessory muscle use.  ?Cardiovascular: Regular rate and rhythm, no murmurs / rubs / gallops. No extremity edema.  ?Neurologic: Grossly intact and nonfocal ?Psychiatric: Normal judgment and insight. Alert and oriented x 3. Normal mood.  ? ? ?Impression and  Plan: ? ?Pre-diabetes ? - Plan: POCT glycosylated hemoglobin (Hb A1C) ?-A1c is stable at 5.9 ? ?Chronic pain syndrome  ?High risk medication use  ?-PDMP reviewed, no red flags, overdose risk score is 80. ?

## 2021-11-22 NOTE — Telephone Encounter (Signed)
Pt is calling and checking on the below message and does not want to see which other pharm has hydrocodone ?

## 2021-11-23 ENCOUNTER — Telehealth: Payer: Self-pay | Admitting: Internal Medicine

## 2021-11-23 MED ORDER — HYDROCODONE-ACETAMINOPHEN 5-325 MG PO TABS: 1.0000 | ORAL_TABLET | Freq: Three times a day (TID) | ORAL | 0 refills | Status: DC | PRN

## 2021-11-23 NOTE — Telephone Encounter (Signed)
Pt call and stated [facility] on [address] have the med but stated they may not have later she stated she have 10 days left. ?

## 2021-11-23 NOTE — Addendum Note (Signed)
Addended by: Erline Hau on: 11/23/2021 03:24 PM ? ? Modules accepted: Orders ? ?

## 2021-11-23 NOTE — Telephone Encounter (Signed)
Per rachel ok to rely the below message. Pt will see which other pharm has hydrocodone 5-325 mg in stock and call the office back with info ?

## 2021-11-23 NOTE — Telephone Encounter (Signed)
Pt is calling  back and requesting HYDROcodone-acetaminophen (NORCO/VICODIN) 5-325 MG tablet to be sent to  ?[facility] [id number] - [place], [place] Phone:  [phone]  ?Fax:  [phone]  ?  ?That pharm is closer ?

## 2021-11-23 NOTE — Telephone Encounter (Signed)
Pt would like a callback once rx has been sent ?

## 2021-11-23 NOTE — Addendum Note (Signed)
Addended by: Westley Hummer B on: 11/23/2021 03:11 PM ? ? Modules accepted: Orders ? ?

## 2021-11-23 NOTE — Telephone Encounter (Signed)
Rx sent 

## 2021-11-29 NOTE — Progress Notes (Signed)
? ?                                                                                                                                                          ?  Patient Name: [name] ?MRN: [id number] ?DOB: 10/28/1940 ?Referring Physician: Lelon Frohlich (Profile Not Attached) ?Date of Service: 11/03/2021 ?[facility], [place] ? ?                                                      End Of Treatment Note ? ?Diagnoses: C50.412-Malignant neoplasm of upper-outer quadrant of left female breast ? ?Cancer Staging:  Cancer Staging  ?Malignant neoplasm of upper-outer quadrant of left breast in female, estrogen receptor positive ([place]) ?Staging form: Breast, AJCC 8th Edition ?- Clinical stage from 08/17/2021: Stage IA (cT1c, cN0, cM0, G2, ER+, PR+, HER2-) - Signed by Truitt Merle, MD on 09/18/2021 ?Stage prefix: Initial diagnosis ?Histologic grading system: 3 grade system ?- Pathologic stage from 09/26/2021: Stage IA (pT1b, pN0, cM0, G2, ER+, PR+, HER2-) - Signed by Truitt Merle, MD on 11/10/2021 ?Stage prefix: Initial diagnosis ?Histologic grading system: 3 grade system ?Residual tumor (R): R0 - None ? ? ?Intent: Curative ? ?Radiation Treatment Dates: 10/30/2021 through 11/03/2021 ?Site Technique Total Dose (Gy) Dose per Fx (Gy) Completed Fx Beam Energies  ?Breast, Left: Breast_L 3D 26/26 5.2 5/5 6XFFF  ? ?Narrative: The patient tolerated radiation therapy relatively well. ? ?Plan: The patient will follow-up with radiation oncology in 29mo. ? ?----------------------------------- ? ?Eppie Gibson, MD ? ?

## 2021-12-03 ENCOUNTER — Other Ambulatory Visit: Payer: Self-pay | Admitting: Internal Medicine

## 2021-12-03 DIAGNOSIS — F411 Generalized anxiety disorder: Secondary | ICD-10-CM

## 2021-12-15 ENCOUNTER — Ambulatory Visit: Payer: Medicare Other | Admitting: Radiation Oncology

## 2021-12-15 ENCOUNTER — Telehealth: Payer: Self-pay

## 2021-12-15 NOTE — Telephone Encounter (Signed)
I called the patient today about her upcoming follow-up appointment in radiation oncology.  ? ?Given the state of the COVID-19 pandemic, concerning case numbers in our community, and guidance from [facility], I offered a phone assessment with the patient to determine if coming to the clinic was necessary. She accepted. ? ?The patient denies any symptomatic concerns.  Patient did share that she recently lost her husband, so she is dealing with grief/fatigue. She denied any lingering pain or swelling to her left breast, or any issues with range of motion to her left arm/shoulder. She reports she started her tamoxifen prescription on 12/02/21, and has been tolerating it well so far. Specifically, she reports good healing of her skin in the radiation fields.  Skin is intact and almost completely back to its baseline color/texture. I recommended that she continue skin care by applying oil or lotion with vitamin E to the skin in the radiation fields, BID, for 2 more months.   ? ?Continue follow-up with medical oncology - follow-up is scheduled on 02/13/2022 with Lacie Burton-NP in the [facility]. Patient stated she did not feel she needed this appointment (due to all the personal struggles she's dealing with currently since her husband's passing). I told her I would pass her preferences along to [place]. I explained that yearly mammograms are important for patients with intact breast tissue, and physical exams are important after mastectomy for patients that cannot undergo mammography. ? ?I encouraged her to call if she had further questions or concerns about her healing, or if there was anything else we could do to help support her during this difficult time. Otherwise, she will follow-up PRN in radiation oncology. Patient is pleased with this plan, and we will cancel her upcoming follow-up to reduce the risk of COVID-19 transmission. ? ? ?

## 2021-12-15 NOTE — Telephone Encounter (Signed)
error 

## 2021-12-18 ENCOUNTER — Encounter: Payer: Self-pay | Admitting: General Practice

## 2021-12-18 NOTE — Progress Notes (Signed)
[place] Spiritual Care Note ? ?Referred by Willodean Rosenthal for bereavement support, as Ms [place] husband Wilber Oliphant died very recently. Reached Ms Speedy by phone, learning that his service was yesterday. She reports that it was very meaningful and that her friends and children are keeping close for support. ? ?Provided empathic listening, normalization of feelings, emotional support, brief grief education, and reminder that hospice offers free grief counseling. ? ?At this time, Ms Swetz does not want more appointments because she is already so busy with logistical tasks, but she welcomes a follow-up phone call in ca two weeks. She also has my direct number in case needs arise in between. ? ? ?Chaplain Lorrin Jackson, MDiv, [facility] ?Pager [phone] ?Voicemail [phone]  ?

## 2021-12-21 ENCOUNTER — Other Ambulatory Visit: Payer: Self-pay | Admitting: Internal Medicine

## 2021-12-21 DIAGNOSIS — G894 Chronic pain syndrome: Secondary | ICD-10-CM

## 2021-12-21 DIAGNOSIS — E785 Hyperlipidemia, unspecified: Secondary | ICD-10-CM

## 2021-12-21 DIAGNOSIS — I10 Essential (primary) hypertension: Secondary | ICD-10-CM

## 2022-01-01 ENCOUNTER — Other Ambulatory Visit: Payer: Self-pay | Admitting: Internal Medicine

## 2022-01-01 ENCOUNTER — Encounter: Payer: Self-pay | Admitting: General Practice

## 2022-01-01 DIAGNOSIS — F411 Generalized anxiety disorder: Secondary | ICD-10-CM

## 2022-01-01 NOTE — Progress Notes (Signed)
[place] Spiritual Care Note ? ?Followed up with Felicia [name] by phone for bereavement check-in. She reports having joined a Public house manager group, which understandably both helps and stirs up feelings. She is working to Programmer, systems and regrets, as well as happy memories. She notes that has gone back to church the past two Sundays, which felt comforting and supportive.  ? ?We plan to follow up by phone late in the month for another pastoral check-in, and [name] knows to reach out as needed/desired, too. ? ? ?Chaplain Lorrin Jackson, MDiv, [facility] ?Pager [phone] ?Voicemail [phone]  ?

## 2022-01-22 ENCOUNTER — Encounter: Payer: Self-pay | Admitting: General Practice

## 2022-01-22 NOTE — Progress Notes (Signed)
[place] Spiritual Care Note  Attempted follow-up bereavement check-in, leaving voicemail message of care and support, encouraging return call as desired.   [place], [place], [facility] Pager [phone] Voicemail [phone]

## 2022-01-29 ENCOUNTER — Other Ambulatory Visit: Payer: Self-pay | Admitting: Internal Medicine

## 2022-01-29 DIAGNOSIS — F411 Generalized anxiety disorder: Secondary | ICD-10-CM

## 2022-02-07 ENCOUNTER — Encounter: Payer: Self-pay | Admitting: Internal Medicine

## 2022-02-07 ENCOUNTER — Ambulatory Visit (INDEPENDENT_AMBULATORY_CARE_PROVIDER_SITE_OTHER): Payer: Medicare Other | Admitting: Internal Medicine

## 2022-02-07 VITALS — BP 118/82 | HR 76 | Wt 161.9 lb

## 2022-02-07 DIAGNOSIS — R300 Dysuria: Secondary | ICD-10-CM

## 2022-02-07 LAB — POC URINALSYSI DIPSTICK (AUTOMATED)
Blood, UA: NEGATIVE
Glucose, UA: NEGATIVE
Ketones, UA: NEGATIVE
Leukocytes, UA: NEGATIVE
Nitrite, UA: NEGATIVE
Protein, UA: POSITIVE — AB
Spec Grav, UA: >=1.03 — AB (ref 1.010–1.025)
Urobilinogen, UA: 0.2 E.U./dL
pH, UA: 5.5 (ref 5.0–8.0)

## 2022-02-07 NOTE — Progress Notes (Signed)
Established Patient Office Visit     CC/Reason for Visit: Dysuria  HPI: [name] is a 81 y.o. female who is coming in today for the above mentioned reasons.  For the past 2 weeks she has been having dysuria.  She feels like she might be a little dehydrated.  Her husband passed away at the end of 2023-01-10.  She is going through grief share classes, feels like she has a good support system.  No fever, no suprapubic pain.  Past Medical/Surgical History: Past Medical History:  Diagnosis Date   Allergy    Anxiety    Arthritis    PAIN AND OA RIGHT KNEE; PAIN AND DDD AND SPINAL STENOSIS   Complication of anesthesia    PULLED OUT MY IV'S AND FOLEY - HALLUCINATIONS WITH HIATAL HERNIA SURGERY   Depression    Diabetes mellitus without complication ([place])    Diverticulosis    PER COLONOSCOPY   GERD (gastroesophageal reflux disease)    OVER THE COUNTER MEDS IF NEEDED   Heart murmur    "VERY SLIGHT ALL MY LIFE"   Hyperlipidemia    Hypertension    Sleep apnea    does not tolerate CPAP - HAS NOT USED IN OVER 2 YRS    Past Surgical History:  Procedure Laterality Date   BACK SURGERY     BREAST LUMPECTOMY WITH RADIOACTIVE SEED LOCALIZATION Left 09/26/2021   Procedure: LEFT BREAST LUMPECTOMY WITH RADIOACTIVE SEED LOCALIZATION;  Surgeon: Coralie Keens, MD;  Location: [place];  Service: General;  Laterality: Left;   COLONOSCOPY     EYE SURGERY     bilateral cataracts   HIATAL HERNIA REPAIR  09/03/2006   LUMBAR FUSION     LUMBAR LAMINECTOMY     PARTIAL KNEE ARTHROPLASTY Right 07/02/2014   Procedure: RIGHT MEDIAL UNICOMPARTMENTAL KNEE;  Surgeon: Mauri Pole, MD;  Location: [facility];  Service: Orthopedics;  Laterality: Right;   UPPER GASTROINTESTINAL ENDOSCOPY      Social History:  reports that she has never smoked. She has never used smokeless tobacco. She reports that she does not drink alcohol and does not use drugs.  Allergies: Allergies  Allergen Reactions   Ambien  [Zolpidem Tartrate]     amnesia   Hydrochlorothiazide     Other reaction(s): Lethargy (intolerance)   Prednisone     Rapid heart rate   Thiazide-Type Diuretics Other (See Comments)    "could not physically move"   Sulfamethoxazole Rash    Family History:  Family History  Problem Relation Age of Onset   Heart disease Mother    Breast cancer Mother    Heart disease Father    Heart disease Sister    Pulmonary embolism Sister    Diabetes Paternal Grandfather      Current Outpatient Medications:    Accu-Chek FastClix Lancets MISC, 1 each by Does not apply route daily., Disp: 100 each, Rfl: 12   ALPRAZolam (XANAX) 0.25 MG tablet, TAKE ONE TABLET BY MOUTH TWICE DAILY AS NEEDED FOR ANXIETY, Disp: 60 tablet, Rfl: 0   amLODipine (NORVASC) 5 MG tablet, TAKE ONE TABLET BY MOUTH DAILY, Disp: 90 tablet, Rfl: 1   Artificial Tear Ointment (DRY EYES OP), Place 1 drop into both eyes at bedtime as needed (Dry eye)., Disp: , Rfl:    Blood Glucose Monitoring Suppl (ACCU-CHEK GUIDE ME) w/Device KIT, 1 each by Does not apply route daily., Disp: 1 kit, Rfl: 2   COVID-19 mRNA bivalent vaccine, Pfizer, (PFIZER  COVID-19 VAC BIVALENT) injection, Inject into the muscle., Disp: 0.3 mL, Rfl: 0   COVID-19 mRNA vaccine, Pfizer, 30 MCG/0.3ML injection, Inject into the muscle., Disp: 0.3 mL, Rfl: 0   diclofenac Sodium (VOLTAREN) 1 % GEL, Apply 4 g topically 4 (four) times daily. Apply to bilateral knees., Disp: , Rfl:    diphenhydramine-acetaminophen (TYLENOL PM) 25-500 MG TABS tablet, Take 2 tablets by mouth at bedtime., Disp: , Rfl:    fluocinonide (LIDEX) 0.05 % external solution, Apply 1 application topically daily as needed for itching., Disp: , Rfl:    fluticasone (CUTIVATE) 0.005 % ointment, Apply 1 application topically daily., Disp: , Rfl:    glucose blood (ACCU-CHEK GUIDE) test strip, 1 each by Other route daily. Use as instructed, Disp: 100 each, Rfl: 12   HYDROcodone-acetaminophen (NORCO/VICODIN) 5-325  MG tablet, Take 1 tablet by mouth 3 (three) times daily as needed for moderate pain or severe pain., Disp: 90 tablet, Rfl: 0   ketoconazole (NIZORAL) 2 % shampoo, Apply 1 application topically 3 (three) times a week., Disp: , Rfl:    lisinopril (ZESTRIL) 5 MG tablet, TAKE ONE TABLET BY MOUTH IN THE MORNING AND TAKE ONE TABLET AT BEDTIME, Disp: 180 tablet, Rfl: 1   meloxicam (MOBIC) 15 MG tablet, TAKE ONE TABLET BY MOUTH DAILY, Disp: 90 tablet, Rfl: 0   mometasone (NASONEX) 50 MCG/ACT nasal spray, Place 2 sprays into the nose at bedtime., Disp: 17 g, Rfl: 6   oxybutynin (DITROPAN-XL) 10 MG 24 hr tablet, Take 1 tablet (10 mg total) by mouth daily., Disp: 90 tablet, Rfl: 1   raNITIdine HCl (ZANTAC PO), Take 4 mg by mouth at bedtime., Disp: , Rfl:    Sennosides (EX-LAX PO), Take 1 tablet by mouth at bedtime., Disp: , Rfl:    simvastatin (ZOCOR) 20 MG tablet, Take 1 tablet (20 mg total) by mouth at bedtime., Disp: 90 tablet, Rfl: 1   tamoxifen (NOLVADEX) 20 MG tablet, Take 1 tablet (20 mg total) by mouth daily., Disp: 30 tablet, Rfl: 3   triamcinolone (NASACORT) 55 MCG/ACT AERO nasal inhaler, Place 2 sprays into the nose daily., Disp: , Rfl:    triamcinolone cream (KENALOG) 0.1 %, APPLY TO RASH SPARINGLY ONCE A DAY., Disp: 30 g, Rfl: 0  Review of Systems:  Constitutional: Denies fever, chills, diaphoresis, appetite change and fatigue.  HEENT: Denies photophobia, eye pain, redness, hearing loss, ear pain, congestion, sore throat, rhinorrhea, sneezing, mouth sores, trouble swallowing, neck pain, neck stiffness and tinnitus.   Respiratory: Denies SOB, DOE, cough, chest tightness,  and wheezing.   Cardiovascular: Denies chest pain, palpitations and leg swelling.  Gastrointestinal: Denies nausea, vomiting, abdominal pain, diarrhea, constipation, blood in stool and abdominal distention.  Genitourinary: Denies  urgency, frequency, hematuria, flank pain and difficulty urinating.  Endocrine: Denies: hot or  cold intolerance, sweats, changes in hair or nails, polyuria, polydipsia. Musculoskeletal: Denies myalgias, back pain, joint swelling, arthralgias and gait problem.  Skin: Denies pallor, rash and wound.  Neurological: Denies dizziness, seizures, syncope, weakness, light-headedness, numbness and headaches.  Hematological: Denies adenopathy. Easy bruising, personal or family bleeding history  Psychiatric/Behavioral: Denies suicidal ideation, mood changes, confusion, nervousness, sleep disturbance and agitation    Physical Exam: Vitals:   02/07/22 0843  BP: 118/82  Pulse: 76  SpO2: 98%  Weight: 161 lb 14.4 oz (73.4 kg)    Body mass index is 28.68 kg/m.   Constitutional: NAD, calm, comfortable Eyes: PERRL, lids and conjunctivae normal, wears corrective lenses ENMT: Mucous membranes are  moist.  Respiratory: clear to auscultation bilaterally, no wheezing, no crackles. Normal respiratory effort. No accessory muscle use.  Cardiovascular: Regular rate and rhythm, no murmurs / rubs / gallops. No extremity edema.  Psychiatric: Normal judgment and insight. Alert and oriented x 3. Normal mood.    Impression and Plan:  Dysuria - Plan: POCT Urinalysis Dipstick (Automated), Urine Culture  -In office dipstick is negative for nitrates, blood, leukocytes. -I will send for urine culture but withhold antibiotics for now pending results. -Advised to stay hydrated.  Time spent:23 minutes reviewing chart, interviewing and examining patient and formulating plan of care.     Lelon Frohlich, MD [facility]

## 2022-02-08 LAB — URINE CULTURE
MICRO NUMBER:: 13495022
Result:: NO GROWTH
SPECIMEN QUALITY:: ADEQUATE

## 2022-02-13 ENCOUNTER — Encounter: Payer: Medicare Other | Admitting: Nurse Practitioner

## 2022-02-15 ENCOUNTER — Encounter: Payer: Self-pay | Admitting: General Practice

## 2022-02-15 NOTE — Progress Notes (Signed)
[facility] Spiritual Care Note  Received and returned voicemail from Ms Maenza, leaving details about availability and encouraging return call.   [place], [place], [facility] Pager [phone] Voicemail [phone]

## 2022-02-15 NOTE — Progress Notes (Signed)
[place] Spiritual Care Note  Received return call from Ms Whaling, serving as a witness to her updates about her grieving process and stressors of illness in the family. She is regularly attending [place] and church, both of which are anchors of support for her. Telling stories about her late husband Wilber Oliphant is helping her, and she finds the [place] "homework" very helpful along the way.  We plan to follow up by phone next month and to visit in person when she is on campus for appointments in September.   [place], [place], [facility] Pager [phone] Voicemail [phone]

## 2022-02-16 ENCOUNTER — Encounter: Payer: Self-pay | Admitting: Family

## 2022-02-16 ENCOUNTER — Ambulatory Visit (INDEPENDENT_AMBULATORY_CARE_PROVIDER_SITE_OTHER): Payer: Medicare Other | Admitting: Family

## 2022-02-16 ENCOUNTER — Telehealth: Payer: Self-pay | Admitting: Internal Medicine

## 2022-02-16 ENCOUNTER — Ambulatory Visit: Payer: Medicare Other

## 2022-02-16 VITALS — BP 132/80 | HR 76 | Temp 98.6°F | Ht 63.0 in | Wt 160.8 lb

## 2022-02-16 DIAGNOSIS — R059 Cough, unspecified: Secondary | ICD-10-CM | POA: Diagnosis not present

## 2022-02-16 DIAGNOSIS — J029 Acute pharyngitis, unspecified: Secondary | ICD-10-CM

## 2022-02-16 LAB — POC COVID19 BINAXNOW: SARS Coronavirus 2 Ag: NEGATIVE

## 2022-02-16 LAB — POCT RAPID STREP A (OFFICE): Rapid Strep A Screen: NEGATIVE

## 2022-02-16 MED ORDER — AMOXICILLIN 500 MG PO CAPS: 500.0000 mg | ORAL_CAPSULE | Freq: Three times a day (TID) | ORAL | 0 refills | Status: AC

## 2022-02-16 MED ORDER — BENZONATATE 100 MG PO CAPS: 100.0000 mg | ORAL_CAPSULE | Freq: Three times a day (TID) | ORAL | 0 refills | Status: DC | PRN

## 2022-02-16 NOTE — Telephone Encounter (Signed)
[facility] requesting a prescription for amoxicillin that patient says was discussed in the appointment today and is not showing up on her list.

## 2022-02-16 NOTE — Telephone Encounter (Signed)
Spoke with Maudie Mercury at [facility] and she stated the Rx was received.

## 2022-02-20 NOTE — Progress Notes (Signed)
   Acute Office Visit  Subjective:     Patient ID: [name], female    DOB: Dec 11, 1940, 81 y.o.   MRN: [id number]  Chief Complaint  Patient presents with   Sore Throat    X1 week   Cough    Non-productive x1 week    HPI Patient is in today with c/o sore throat, cough that is worsening. She has been negative for COVID. Denies any fever and chills. Denies any sick contacts. Has not taken any medications.   Husband died 2022/12/27.   Review of Systems  Constitutional:  Negative for chills and fever.  HENT:  Positive for sore throat.   Respiratory:  Positive for cough.   Cardiovascular: Negative.   Musculoskeletal: Negative.   Endo/Heme/Allergies: Negative.   Psychiatric/Behavioral: Negative.    All other systems reviewed and are negative.       Objective:    BP 132/80 (BP Location: Right Arm, Patient Position: Sitting, Cuff Size: Normal)   Pulse 76   Temp 98.6 F (37 C) (Oral)   Ht '5\' 3"'$  (1.6 m)   Wt 160 lb 12.8 oz (72.9 kg)   SpO2 98%   BMI 28.48 kg/m    Physical Exam Vitals and nursing note reviewed.  Constitutional:      Appearance: She is well-developed.  HENT:     Right Ear: Tympanic membrane and ear canal normal.     Left Ear: Tympanic membrane and ear canal normal.     Mouth/Throat:     Mouth: Mucous membranes are moist.  Cardiovascular:     Rate and Rhythm: Normal rate and regular rhythm.  Pulmonary:     Effort: Pulmonary effort is normal.     Breath sounds: Normal breath sounds.  Musculoskeletal:     Cervical back: Normal range of motion and neck supple.  Skin:    General: Skin is warm and dry.  Neurological:     General: No focal deficit present.     Mental Status: She is alert and oriented to person, place, and time.     Results for orders placed or performed in visit on 02/16/22  POC COVID-19  Result Value Ref Range   SARS Coronavirus 2 Ag Negative Negative  POC Rapid Strep A  Result Value Ref Range   Rapid Strep A Screen  Negative Negative        Assessment & Plan:   Problem List Items Addressed This Visit   None Visit Diagnoses     Sore throat    -  Primary   Relevant Orders   POC COVID-19 (Completed)   POC Rapid Strep A (Completed)   Cough, unspecified type       Relevant Orders   POC COVID-19 (Completed)   POC Rapid Strep A (Completed)       Meds ordered this encounter  Medications   benzonatate (TESSALON) 100 MG capsule    Sig: Take 1 capsule (100 mg total) by mouth 3 (three) times daily as needed for cough.    Dispense:  20 capsule    Refill:  0   amoxicillin (AMOXIL) 500 MG capsule    Sig: Take 1 capsule (500 mg total) by mouth 3 (three) times daily for 10 days.    Dispense:  30 capsule    Refill:  0    Call the office if symptoms worsen or persist. Recheck as scheduled and as needed.   Kennyth Arnold, FNP

## 2022-02-27 ENCOUNTER — Ambulatory Visit (INDEPENDENT_AMBULATORY_CARE_PROVIDER_SITE_OTHER): Payer: Medicare Other | Admitting: Internal Medicine

## 2022-02-27 ENCOUNTER — Encounter: Payer: Self-pay | Admitting: Internal Medicine

## 2022-02-27 VITALS — BP 130/80 | HR 80 | Temp 98.6°F | Wt 164.5 lb

## 2022-02-27 DIAGNOSIS — F411 Generalized anxiety disorder: Secondary | ICD-10-CM | POA: Diagnosis not present

## 2022-02-27 DIAGNOSIS — G894 Chronic pain syndrome: Secondary | ICD-10-CM | POA: Diagnosis not present

## 2022-02-27 DIAGNOSIS — E785 Hyperlipidemia, unspecified: Secondary | ICD-10-CM | POA: Diagnosis not present

## 2022-02-27 DIAGNOSIS — N3281 Overactive bladder: Secondary | ICD-10-CM

## 2022-02-27 DIAGNOSIS — I10 Essential (primary) hypertension: Secondary | ICD-10-CM

## 2022-02-27 DIAGNOSIS — E1169 Type 2 diabetes mellitus with other specified complication: Secondary | ICD-10-CM

## 2022-02-27 DIAGNOSIS — Z79899 Other long term (current) drug therapy: Secondary | ICD-10-CM

## 2022-02-27 LAB — MICROALBUMIN / CREATININE URINE RATIO
Creatinine,U: 67.3 mg/dL
Microalb Creat Ratio: 1 mg/g (ref 0.0–30.0)
Microalb, Ur: <0.7 mg/dL (ref 0.0–1.9)

## 2022-02-27 LAB — POCT GLYCOSYLATED HEMOGLOBIN (HGB A1C): Hemoglobin A1C: 6.3 % — AB (ref 4.0–5.6)

## 2022-02-27 MED ORDER — HYDROCODONE-ACETAMINOPHEN 5-325 MG PO TABS: 1.0000 | ORAL_TABLET | Freq: Three times a day (TID) | ORAL | 0 refills | Status: DC | PRN

## 2022-02-27 MED ORDER — OXYBUTYNIN CHLORIDE ER 10 MG PO TB24: 10.0000 mg | ORAL_TABLET | Freq: Every day | ORAL | 1 refills | Status: DC

## 2022-02-27 MED ORDER — ALPRAZOLAM 0.25 MG PO TABS: 0.2500 mg | ORAL_TABLET | Freq: Two times a day (BID) | ORAL | 0 refills | Status: DC | PRN

## 2022-02-27 MED ORDER — SIMVASTATIN 20 MG PO TABS: 20.0000 mg | ORAL_TABLET | Freq: Every day | ORAL | 1 refills | Status: DC

## 2022-02-27 MED ORDER — MELOXICAM 15 MG PO TABS: 15.0000 mg | ORAL_TABLET | Freq: Every day | ORAL | 0 refills | Status: DC

## 2022-02-27 MED ORDER — LISINOPRIL 5 MG PO TABS: ORAL_TABLET | ORAL | 1 refills | Status: DC

## 2022-02-27 MED ORDER — AMLODIPINE BESYLATE 5 MG PO TABS: ORAL_TABLET | ORAL | 1 refills | Status: DC

## 2022-03-12 ENCOUNTER — Encounter: Payer: Self-pay | Admitting: General Practice

## 2022-03-12 NOTE — Progress Notes (Signed)
[facility] Spiritual Care Note  Followed up with Ms Pauls for bereavement check-in. She reports ongoing support from family and community, including a potential shift from Grief Share to a new Bible study in August, because the timing of the two has turned out to overlap. She reports that she is feeling good enough in her healing to make such a switch and is aware of other community resources, such as [facility], for individual and group grief support.  We plan to follow up by phone in August and then to meet in person in September, and Ms Bing knows to reach out in the meantime as needed/desired.   [place], [place], [facility] Pager [phone] Voicemail [phone]

## 2022-03-31 ENCOUNTER — Other Ambulatory Visit: Payer: Self-pay | Admitting: Hematology

## 2022-04-03 ENCOUNTER — Other Ambulatory Visit: Payer: Self-pay | Admitting: Internal Medicine

## 2022-04-03 DIAGNOSIS — F411 Generalized anxiety disorder: Secondary | ICD-10-CM

## 2022-04-04 NOTE — Telephone Encounter (Signed)
Last Rx given on 6/27

## 2022-04-09 DIAGNOSIS — C50912 Malignant neoplasm of unspecified site of left female breast: Secondary | ICD-10-CM | POA: Diagnosis not present

## 2022-04-23 ENCOUNTER — Encounter: Payer: Self-pay | Admitting: General Practice

## 2022-04-23 NOTE — Progress Notes (Signed)
[facility] Spiritual Care Note  Followed up with Ms [name] by phone. She limited her check-in because she was out to lunch with a dear friend. In general, she reports that she is doing well and feels ready to end her time in [place] (once she has the opportunity to say goodbye to a couple of people she has connected with). [name] is looking forward to her Bible study, which will resume in September. We still have for 05/17/2022 appointment on the calendar, and she has chaplain's direct number in case needs arise in the meantime.   [place], [place], [facility] Pager [phone] Voicemail [phone]

## 2022-05-01 ENCOUNTER — Other Ambulatory Visit: Payer: Self-pay | Admitting: Internal Medicine

## 2022-05-01 DIAGNOSIS — F411 Generalized anxiety disorder: Secondary | ICD-10-CM

## 2022-05-03 ENCOUNTER — Telehealth: Payer: Self-pay

## 2022-05-03 ENCOUNTER — Telehealth: Payer: Medicare Other | Admitting: Family Medicine

## 2022-05-03 ENCOUNTER — Telehealth (INDEPENDENT_AMBULATORY_CARE_PROVIDER_SITE_OTHER): Payer: Medicare Other | Admitting: Family Medicine

## 2022-05-03 ENCOUNTER — Other Ambulatory Visit (HOSPITAL_COMMUNITY): Payer: Self-pay

## 2022-05-03 DIAGNOSIS — U071 COVID-19: Secondary | ICD-10-CM

## 2022-05-03 MED ORDER — BENZONATATE 100 MG PO CAPS: 100.0000 mg | ORAL_CAPSULE | Freq: Three times a day (TID) | ORAL | 0 refills | Status: DC | PRN

## 2022-05-03 MED ORDER — MOLNUPIRAVIR EUA 200MG CAPSULE
4.0000 | ORAL_CAPSULE | Freq: Two times a day (BID) | ORAL | 0 refills | Status: AC
  Filled 2022-05-03: qty 40, 5d supply, fill #0

## 2022-05-03 NOTE — Patient Instructions (Signed)
HOME CARE TIPS:   -I sent the medication(s) we discussed to your pharmacy: Meds ordered this encounter  Medications   molnupiravir EUA (LAGEVRIO) 200 mg CAPS capsule    Sig: Take 4 capsules (800 mg total) by mouth 2 (two) times daily for 5 days.    Dispense:  40 capsule    Refill:  0    Pt requests delivery if you can get it to her today. If not please let her know.   benzonatate (TESSALON PERLES) 100 MG capsule    Sig: Take 1 capsule (100 mg total) by mouth 3 (three) times daily as needed.    Dispense:  20 capsule    Refill:  0     -I sent in the [place] treatment or referral you requested per our discussion. Please see the information provided below and discuss further with the pharmacist/treatment team.    -there is a chance of rebound illness with covid after improving. This can happen whether or not you take an antiviral treatment. If you become sick again with covid after getting better, please schedule a follow up virtual visit and isolate again.  -can use tylenol or aleve if needed for fevers, aches and pains per instructions  -nasal saline sinus rinses twice daily  -stay hydrated, drink plenty of fluids and eat small healthy meals - avoid dairy  -follow up with your doctor in 2-3 days unless improving and feeling better  -stay home while sick, except to seek medical care. If you have COVID19, you will likely be contagious for 7-10 days. Flu or Influenza is likely contagious for about 7 days. Other respiratory viral infections remain contagious for 5-10+ days depending on the virus and many other factors. Wear a good mask that fits snugly (such as N95 or KN95) if around others to reduce the risk of transmission.  It was nice to meet you today, and I really hope you are feeling better soon. I help [facility] out with telemedicine visits on Tuesdays and Thursdays and am happy to help if you need a follow up virtual visit on those days. Otherwise, if you have any concerns or  questions following this visit please schedule a follow up visit with your Primary Care doctor or seek care at a local urgent care clinic to avoid delays in care.    Seek in person care or schedule a follow up video visit promptly if your symptoms worsen, new concerns arise or you are not improving with treatment. Call 911 and/or seek emergency care if your symptoms are severe or life threatening.  PLEASE SEE THE FOLLOWING LINK FOR THE MOST UPDATED INFORMATION ABOUT LAGEVRIO:  [email or web address]      Fact Sheet for Patients And Caregivers Emergency Use Authorization (EUA) Of LAGEVRIOT (molnupiravir) capsules For Coronavirus Disease 2019 (COVID-19)  What is the most important information I should know about LAGEVRIO? LAGEVRIO may cause serious side effects, including: ? LAGEVRIO may cause harm to your unborn baby. It is not known if LAGEVRIO will harm your baby if you take LAGEVRIO during pregnancy. o LAGEVRIO is not recommended for use in pregnancy. o LAGEVRIO has not been studied in pregnancy. LAGEVRIO was studied in pregnant animals only. When LAGEVRIO was given to pregnant animals, LAGEVRIO caused harm to their unborn babies. o You and your healthcare provider may decide that you should take LAGEVRIO during pregnancy if there are no other COVID-19 treatment options approved or authorized by the FDA that are accessible or clinically appropriate for you. o If  you and your healthcare provider decide that you should take LAGEVRIO during pregnancy, you and your healthcare provider should discuss the known and potential benefits and the potential risks of taking LAGEVRIO during pregnancy. For individuals who are able to become pregnant: ? You should use a reliable method of birth control (contraception) consistently and correctly during treatment with LAGEVRIO and for 4 days after the last dose of LAGEVRIO. Talk to your healthcare provider about reliable birth control  methods. ? Before starting treatment with [facility] your healthcare provider may do a pregnancy test to see if you are pregnant before starting treatment with LAGEVRIO. ? Tell your healthcare provider right away if you become pregnant or think you may be pregnant during treatment with LAGEVRIO. Pregnancy Surveillance Program: ? There is a pregnancy surveillance program for individuals who take LAGEVRIO during pregnancy. The purpose of this program is to collect information about the health of you and your baby. Talk to your healthcare provider about how to take part in this program. ? If you take LAGEVRIO during pregnancy and you agree to participate in the pregnancy surveillance program and allow your healthcare provider to share your information with [place], then your healthcare provider will report your use of [place] during pregnancy to [place]. by calling [phone] or PeacefulBlog.es. For individuals who are sexually active with partners who are able to become pregnant: ? It is not known if LAGEVRIO can affect sperm. While the risk is regarded as low, animal studies to fully assess the potential for LAGEVRIO to affect the babies of males treated with LAGEVRIO have not been completed. A reliable method of birth control (contraception) should be used consistently and correctly during treatment with LAGEVRIO and for at least 3 months after the last dose. The risk to sperm beyond 3 months is not known. Studies to understand the risk to sperm beyond 3 months are ongoing. Talk to your healthcare provider about reliable birth control methods. Talk to your healthcare provider if you have questions or concerns about how LAGEVRIO may affect sperm. You are being given this fact sheet because your healthcare provider believes it is necessary to provide you with LAGEVRIO for the treatment of adults with mild-to-moderate coronavirus disease 2019  (COVID-19) with positive results of direct SARS-CoV-2 viral testing, and who are at high risk for progression to severe COVID-19 including hospitalization or death, and for whom other COVID-19 treatment options approved or authorized by the FDA are not accessible or clinically appropriate. The U.S. Food and Drug Administration (FDA) has issued an Emergency Use Authorization (EUA) to make LAGEVRIO available during the COVID-19 pandemic (for more details about an EUA please see "What is an Emergency Use Authorization?" at the end of this document). LAGEVRIO is not an FDA-approved medicine in the [place]. Read this Fact Sheet for information about LAGEVRIO. Talk to your healthcare provider about your options if you have any questions. It is your choice to take LAGEVRIO.  What is COVID-19? COVID-19 is caused by a virus called a coronavirus. You can get COVID-19 through close contact with another person who has the virus. COVID-19 illnesses have ranged from very mild-to-severe, including illness resulting in death. While information so far suggests that most COVID-19 illness is mild, serious illness can happen and may cause some of your other medical conditions to become worse. Older people and people of all ages with severe, long lasting (chronic) medical conditions like heart disease, lung disease and diabetes, for example  seem to be at higher risk of being hospitalized for COVID-19.  What is LAGEVRIO? LAGEVRIO is an investigational medicine used to treat mild-to-moderate COVID-19 in adults: ? with positive results of direct SARS-CoV-2 viral testing, and ? who are at high risk for progression to severe COVID-19 including hospitalization or death, and for whom other COVID-19 treatment options approved or authorized by the FDA are not accessible or clinically appropriate. The FDA has authorized the emergency use of LAGEVRIO for the treatment of mild-tomoderate COVID-19 in adults under an  EUA. For more information on EUA, see the "What is an Emergency Use Authorization (EUA)?" section at the end of this Fact Sheet. LAGEVRIO is not authorized: ? for use in people less than 56 years of age. ? for prevention of COVID-19. ? for people needing hospitalization for COVID-19. ? for use for longer than 5 consecutive days.  What should I tell my healthcare provider before I take LAGEVRIO? Tell your healthcare provider if you: ? Have any allergies ? Are breastfeeding or plan to breastfeed ? Have any serious illnesses ? Are taking any medicines (prescription, over-the-counter, vitamins, or herbal products).  How do I take LAGEVRIO? ? Take LAGEVRIO exactly as your healthcare provider tells you to take it. ? Take 4 capsules of LAGEVRIO every 12 hours (for example, at 8 am and at 8 pm) ? Take LAGEVRIO for 5 days. It is important that you complete the full 5 days of treatment with LAGEVRIO. Do not stop taking LAGEVRIO before you complete the full 5 days of treatment, even if you feel better. ? Take LAGEVRIO with or without food. ? You should stay in isolation for as long as your healthcare provider tells you to. Talk to your healthcare provider if you are not sure about how to properly isolate while you have COVID-19. ? Swallow LAGEVRIO capsules whole. Do not open, break, or crush the capsules. If you cannot swallow capsules whole, tell your healthcare provider. ? What to do if you miss a dose: o If it has been less than 10 hours since the missed dose, take it as soon as you remember o If it has been more than 10 hours since the missed dose, skip the missed dose and take your dose at the next scheduled time. ? Do not double the dose of LAGEVRIO to make up for a missed dose.  What are the important possible side effects of LAGEVRIO? ? See, "What is the most important information I should know about LAGEVRIO?" ? Allergic Reactions. Allergic reactions can happen in people taking  LAGEVRIO, even after only 1 dose. Stop taking LAGEVRIO and call your healthcare provider right away if you get any of the following symptoms of an allergic reaction: o hives o rapid heartbeat o trouble swallowing or breathing o swelling of the mouth, lips, or face o throat tightness o hoarseness o skin rash The most common side effects of LAGEVRIO are: ? diarrhea ? nausea ? dizziness These are not all the possible side effects of LAGEVRIO. Not many people have taken LAGEVRIO. Serious and unexpected side effects may happen. This medicine is still being studied, so it is possible that all of the risks are not known at this time.  What other treatment choices are there?  Veklury (remdesivir) is FDA-approved as an intravenous (IV) infusion for the treatment of mildto-moderate XIPJA-25 in certain adults and children. Talk with your doctor to see if Marijean Heath is appropriate for you. Like LAGEVRIO, FDA may also allow for the  emergency use of other medicines to treat people with COVID-19. Go to LacrosseProperties.si for more information. It is your choice to be treated or not to be treated with LAGEVRIO. Should you decide not to take it, it will not change your standard medical care.  What if I am breastfeeding? Breastfeeding is not recommended during treatment with LAGEVRIO and for 4 days after the last dose of LAGEVRIO. If you are breastfeeding or plan to breastfeed, talk to your healthcare provider about your options and specific situation before taking LAGEVRIO.  How do I report side effects with LAGEVRIO? Contact your healthcare provider if you have any side effects that bother you or do not go away. Report side effects to FDA MedWatch at SmoothHits.hu or call 1-800-FDA-1088 (1- 757-790-5832).  How should I store [place]? ? Store LAGEVRIO capsules at room temperature between  42F to 74F (20C to 25C). ? Keep LAGEVRIO and all medicines out of the reach of children and pets. How can I learn more about COVID-19? ? Ask your healthcare provider. ? Visit SeekRooms.co.uk ? Contact your local or state public health department. ? Call [place] at [phone] (toll free in the [place] ? Visit [email or web address].com  What Is an Emergency Use Authorization (EUA)? The [place] FDA has made [place] available under an emergency access mechanism called an Emergency Use Authorization (EUA) The EUA is supported by a Presenter, broadcasting Health and Human Service (HHS) declaration that circumstances exist to justify emergency use of drugs and biological products during the COVID-19 pandemic. LAGEVRIO for the treatment of mild-to-moderate COVID-19 in adults with positive results of direct SARS-CoV-2 viral testing, who are at high risk for progression to severe COVID-19, including hospitalization or death, and for whom alternative COVID-19 treatment options approved or authorized by FDA are not accessible or clinically appropriate, has not undergone the same type of review as an FDA-approved product. In issuing an EUA under the PRFFM-38 public health emergency, the FDA has determined, among other things, that based on the total amount of scientific evidence available including data from adequate and well-controlled clinical trials, if available, it is reasonable to believe that the product may be effective for diagnosing, treating, or preventing COVID-19, or a serious or life-threatening disease or condition caused by COVID-19; that the known and potential benefits of the product, when used to diagnose, treat, or prevent such disease or condition, outweigh the known and potential risks of such product; and that there are no adequate, approved, and available alternatives.  All of these criteria must be met to allow for the product to be used in the treatment of  patients during the COVID-19 pandemic. The EUA for LAGEVRIO is in effect for the duration of the COVID-19 declaration justifying emergency use of LAGEVRIO, unless terminated or revoked (after which LAGEVRIO may no longer be used under the EUA). For patent information: [email or web address].[email or web address] Copyright  2021-2022 [place] [place], [place] and its affiliates. All rights reserved. [id number] Revised: March 2022

## 2022-05-03 NOTE — Progress Notes (Signed)
Virtual Visit via Telephone Note  I connected with [name] on 05/03/22 at 12:00 PM EDT by telephone and verified that I am speaking with the correct person using two identifiers.   I discussed the limitations of performing an evaluation and management service by telephone and requested permission for a phone visit. The patient expressed understanding and agreed to proceed.  Location patient:   Location provider: work or home office Participants present for the call: patient, provider Patient did not have a visit with me in the prior 7 days to address this/these issue(s).   History of Present Illness:  Acute telemedicine visit for Covid19 -Onset: yesterday -had two positive covid tests one yesterday and one today -Symptoms include: sore throat, nasal congestion, slight headache, body aches some, cough -Denies: fever, NVD, CP, SOB -was exposed covid last week -Pertinent past medical history: see below, has not had covid before, had kidney check in January - GFR > 60, denies hx of kidney disease -Pertinent medication allergies:  Allergies  Allergen Reactions   Ambien [Zolpidem Tartrate]     amnesia   Hydrochlorothiazide     Other reaction(s): Lethargy (intolerance)   Prednisone     Rapid heart rate   Thiazide-Type Diuretics Other (See Comments)    "could not physically move"   Sulfamethoxazole Rash  -COVID-19 vaccine status: all boosters and doses to day Immunization History  Administered Date(s) Administered   DT (Pediatric) 08/12/2013   Fluad Quad(high Dose 65+) 05/26/2019, 05/16/2020, 05/19/2021   Influenza Split 05/26/2012   Influenza Whole 06/12/2007, 06/04/2008, 06/01/2009, 05/15/2011   Influenza, High Dose Seasonal PF 05/04/2018, 05/16/2020   Influenza,inj,Quad PF,6+ Mos 06/05/2013, 06/10/2014, 05/13/2015   PFIZER Comirnaty(Gray Top)Covid-19 Tri-Sucrose Vaccine 12/13/2020   PFIZER(Purple Top)SARS-COV-2 Vaccination 09/14/2019, 10/05/2019, 06/03/2020   Pfizer  Covid-19 Vaccine Bivalent Booster 19yr & up 08/10/2021   Pneumococcal Conjugate-13 06/10/2014   Pneumococcal Polysaccharide-23 06/12/2013, 10/12/2020   Tdap 05/17/2021   Zoster Recombinat (Shingrix) 01/22/2022   Zoster, Live 07/14/2007      Past Medical History:  Diagnosis Date   Allergy    Anxiety    Arthritis    PAIN AND OA RIGHT KNEE; PAIN AND DDD AND SPINAL STENOSIS   Complication of anesthesia    PULLED OUT MY IV'S AND FOLEY - HALLUCINATIONS WITH HIATAL HERNIA SURGERY   Depression    Diabetes mellitus without complication (HCC)    Diverticulosis    PER COLONOSCOPY   GERD (gastroesophageal reflux disease)    OVER THE COUNTER MEDS IF NEEDED   Heart murmur    "VERY SLIGHT ALL MY LIFE"   Hyperlipidemia    Hypertension    Sleep apnea    does not tolerate CPAP - HAS NOT USED IN OVER 2 YRS    Current Outpatient Medications on File Prior to Visit  Medication Sig Dispense Refill   Accu-Chek FastClix Lancets MISC 1 each by Does not apply route daily. 100 each 12   ALPRAZolam (XANAX) 0.25 MG tablet TAKE ONE TABLET BY MOUTH TWICE DAILY AS NEEDED FOR ANXIETY 60 tablet 0   amLODipine (NORVASC) 5 MG tablet TAKE ONE TABLET BY MOUTH DAILY 90 tablet 1   Artificial Tear Ointment (DRY EYES OP) Place 1 drop into both eyes at bedtime as needed (Dry eye).     Blood Glucose Monitoring Suppl (ACCU-CHEK GUIDE ME) w/Device KIT 1 each by Does not apply route daily. 1 kit 2   COVID-19 mRNA bivalent vaccine, Pfizer, (PFIZER COVID-19 VAC BIVALENT) injection Inject into the muscle.  0.3 mL 0   COVID-19 mRNA vaccine, Pfizer, 30 MCG/0.3ML injection Inject into the muscle. 0.3 mL 0   diclofenac Sodium (VOLTAREN) 1 % GEL Apply 4 g topically 4 (four) times daily. Apply to bilateral knees.     diphenhydramine-acetaminophen (TYLENOL PM) 25-500 MG TABS tablet Take 2 tablets by mouth at bedtime.     fluocinonide (LIDEX) 0.05 % external solution Apply 1 application topically daily as needed for itching.      fluticasone (CUTIVATE) 0.005 % ointment Apply 1 application topically daily.     glucose blood (ACCU-CHEK GUIDE) test strip 1 each by Other route daily. Use as instructed 100 each 12   HYDROcodone-acetaminophen (NORCO/VICODIN) 5-325 MG tablet Take 1 tablet by mouth 3 (three) times daily as needed for moderate pain or severe pain. 90 tablet 0   HYDROcodone-acetaminophen (NORCO/VICODIN) 5-325 MG tablet Take 1 tablet by mouth 3 (three) times daily as needed for moderate pain. 90 tablet 0   HYDROcodone-acetaminophen (NORCO/VICODIN) 5-325 MG tablet Take 1 tablet by mouth 3 (three) times daily as needed for moderate pain. 90 tablet 0   ketoconazole (NIZORAL) 2 % shampoo Apply 1 application topically 3 (three) times a week.     lisinopril (ZESTRIL) 5 MG tablet TAKE ONE TABLET BY MOUTH IN THE MORNING AND TAKE ONE TABLET AT BEDTIME 180 tablet 1   meloxicam (MOBIC) 15 MG tablet Take 1 tablet (15 mg total) by mouth daily. 90 tablet 0   mometasone (NASONEX) 50 MCG/ACT nasal spray Place 2 sprays into the nose at bedtime. 17 g 6   oxybutynin (DITROPAN-XL) 10 MG 24 hr tablet Take 1 tablet (10 mg total) by mouth daily. 90 tablet 1   raNITIdine HCl (ZANTAC PO) Take 4 mg by mouth at bedtime.     Sennosides (EX-LAX PO) Take 1 tablet by mouth at bedtime.     simvastatin (ZOCOR) 20 MG tablet Take 1 tablet (20 mg total) by mouth at bedtime. 90 tablet 1   tamoxifen (NOLVADEX) 20 MG tablet TAKE ONE TABLET BY MOUTH DAILY FIRST DOSE START ON 12-02-21 30 tablet 3   triamcinolone (NASACORT) 55 MCG/ACT AERO nasal inhaler Place 2 sprays into the nose daily.     triamcinolone cream (KENALOG) 0.1 % APPLY TO RASH SPARINGLY ONCE A DAY. 30 g 0   No current facility-administered medications on file prior to visit.  She takes benzo and hydrocodone daily and needs refill on alprazolam.  Observations/Objective: Patient sounds cheerful and well on the phone. I do not appreciate any SOB. Speech and thought processing are grossly  intact. Patient reported vitals:  Assessment and Plan:  COVID-19  - Discussed treatment options, side effect and risk of drug interactions, ideal treatment window, potential complications, isolation and precautions for COVID-19.  Discussed possibility of rebound with or without antivirals. Checked for/reviewed last GFR - listed in HPI if available.  After lengthy discussion, the patient opted for treatment with Legevrio due to being higher risk for complications of covid or severe disease and other factors. Did let [name] know some pharmacies have been out of covid drugs this week and encouraged [name] to call right after visit to check and if out to let me know so could send elsewhere.Information was shared with patient during the visit and also was provided in patient instructions.  The patient did want a prescription for cough, Tessalon Rx sent.  Other symptomatic care measures summarized in patient instructions. Patient also wanted me to check on status of [name] alprazolam refill. Reports  had requested refill but has not yet gotten. Sent message to PCP as looks like was refilled.  Advised to seek prompt virtual visit or in person care if worsening, new symptoms arise, or if is not improving with treatment as expected per our conversation of expected course. Discussed options for follow up care. Did let this patient know that I do telemedicine on Tuesdays and Thursdays for [place] and those are the days I am logged into the system. Advised to schedule follow up visit with PCP, [place] virtual visits or [facility] if any further questions or concerns to avoid delays in care.   I discussed the assessment and treatment plan with the patient. The patient was provided an opportunity to ask questions and all were answered. The patient agreed with the plan and demonstrated an understanding of the instructions.    Follow Up Instructions:  I did not refer this patient for an OV with me in the next 24 hours for  this/these issue(s).  I discussed the assessment and treatment plan with the patient. The patient was provided an opportunity to ask questions and all were answered. The patient agreed with the plan and demonstrated an understanding of the instructions.   I spent 18 minutes on the date of this visit in the care of this patient. See summary of tasks completed to properly care for this patient in the detailed notes above which also included counseling of above, review of PMH, medications, allergies, evaluation of the patient and ordering and/or  instructing patient on testing and care options.     Lucretia Kern, DO

## 2022-05-03 NOTE — Telephone Encounter (Signed)
Attempt to reach patient several times in regards to her appointment with Dr. Maudie Mercury.   Left a voicemail to call us back.  Dr. Maudie Mercury also attempted to reach patient multiple times, unsuccessfully. She advise for patient to r/s her appointment later on the day if patient call back.

## 2022-05-17 ENCOUNTER — Inpatient Hospital Stay: Payer: Medicare Other

## 2022-05-17 ENCOUNTER — Encounter: Payer: Self-pay | Admitting: Hematology

## 2022-05-17 ENCOUNTER — Other Ambulatory Visit: Payer: Self-pay

## 2022-05-17 ENCOUNTER — Inpatient Hospital Stay: Payer: Medicare Other | Admitting: General Practice

## 2022-05-17 ENCOUNTER — Inpatient Hospital Stay: Payer: Medicare Other | Attending: Hematology | Admitting: Hematology

## 2022-05-17 ENCOUNTER — Encounter: Payer: Self-pay | Admitting: General Practice

## 2022-05-17 VITALS — BP 167/73 | HR 70 | Temp 98.5°F | Resp 15 | Wt 159.8 lb

## 2022-05-17 DIAGNOSIS — E119 Type 2 diabetes mellitus without complications: Secondary | ICD-10-CM | POA: Insufficient documentation

## 2022-05-17 DIAGNOSIS — I10 Essential (primary) hypertension: Secondary | ICD-10-CM | POA: Insufficient documentation

## 2022-05-17 DIAGNOSIS — E2839 Other primary ovarian failure: Secondary | ICD-10-CM

## 2022-05-17 DIAGNOSIS — E785 Hyperlipidemia, unspecified: Secondary | ICD-10-CM | POA: Insufficient documentation

## 2022-05-17 DIAGNOSIS — C50412 Malignant neoplasm of upper-outer quadrant of left female breast: Secondary | ICD-10-CM | POA: Insufficient documentation

## 2022-05-17 DIAGNOSIS — Z882 Allergy status to sulfonamides status: Secondary | ICD-10-CM | POA: Insufficient documentation

## 2022-05-17 DIAGNOSIS — F419 Anxiety disorder, unspecified: Secondary | ICD-10-CM | POA: Diagnosis not present

## 2022-05-17 DIAGNOSIS — R232 Flushing: Secondary | ICD-10-CM | POA: Insufficient documentation

## 2022-05-17 DIAGNOSIS — Z888 Allergy status to other drugs, medicaments and biological substances status: Secondary | ICD-10-CM | POA: Insufficient documentation

## 2022-05-17 DIAGNOSIS — Z79810 Long term (current) use of selective estrogen receptor modulators (SERMs): Secondary | ICD-10-CM | POA: Insufficient documentation

## 2022-05-17 DIAGNOSIS — M1711 Unilateral primary osteoarthritis, right knee: Secondary | ICD-10-CM | POA: Diagnosis not present

## 2022-05-17 DIAGNOSIS — Z170 Estrogen receptor positive status [ER+]: Secondary | ICD-10-CM | POA: Diagnosis not present

## 2022-05-17 DIAGNOSIS — G894 Chronic pain syndrome: Secondary | ICD-10-CM | POA: Diagnosis not present

## 2022-05-17 DIAGNOSIS — Z853 Personal history of malignant neoplasm of breast: Secondary | ICD-10-CM | POA: Diagnosis not present

## 2022-05-17 DIAGNOSIS — Z79899 Other long term (current) drug therapy: Secondary | ICD-10-CM | POA: Insufficient documentation

## 2022-05-17 LAB — CBC WITH DIFFERENTIAL/PLATELET
Abs Immature Granulocytes: 0.02 10*3/uL (ref 0.00–0.07)
Basophils Absolute: 0 10*3/uL (ref 0.0–0.1)
Basophils Relative: 1 %
Eosinophils Absolute: 0.1 10*3/uL (ref 0.0–0.5)
Eosinophils Relative: 1 %
HCT: 36.8 % (ref 36.0–46.0)
Hemoglobin: 12.3 g/dL (ref 12.0–15.0)
Immature Granulocytes: 0 %
Lymphocytes Relative: 28 %
Lymphs Abs: 1.8 10*3/uL (ref 0.7–4.0)
MCH: 32.5 pg (ref 26.0–34.0)
MCHC: 33.4 g/dL (ref 30.0–36.0)
MCV: 97.4 fL (ref 80.0–100.0)
Monocytes Absolute: 0.5 10*3/uL (ref 0.1–1.0)
Monocytes Relative: 7 %
Neutro Abs: 4.1 10*3/uL (ref 1.7–7.7)
Neutrophils Relative %: 63 %
Platelets: 247 10*3/uL (ref 150–400)
RBC: 3.78 MIL/uL — ABNORMAL LOW (ref 3.87–5.11)
RDW: 12.2 % (ref 11.5–15.5)
WBC: 6.5 10*3/uL (ref 4.0–10.5)
nRBC: 0 % (ref 0.0–0.2)

## 2022-05-17 LAB — COMPREHENSIVE METABOLIC PANEL
ALT: 9 U/L (ref 0–44)
AST: 14 U/L — ABNORMAL LOW (ref 15–41)
Albumin: 4 g/dL (ref 3.5–5.0)
Alkaline Phosphatase: 29 U/L — ABNORMAL LOW (ref 38–126)
Anion gap: 4 — ABNORMAL LOW (ref 5–15)
BUN: 14 mg/dL (ref 8–23)
CO2: 29 mmol/L (ref 22–32)
Calcium: 9.1 mg/dL (ref 8.9–10.3)
Chloride: 107 mmol/L (ref 98–111)
Creatinine, Ser: 0.78 mg/dL (ref 0.44–1.00)
GFR, Estimated: >60 mL/min (ref >60)
Glucose, Bld: 128 mg/dL — ABNORMAL HIGH (ref 70–99)
Potassium: 3.7 mmol/L (ref 3.5–5.1)
Sodium: 140 mmol/L (ref 135–145)
Total Bilirubin: 0.3 mg/dL (ref 0.3–1.2)
Total Protein: 6.5 g/dL (ref 6.5–8.1)

## 2022-05-17 MED ORDER — TAMOXIFEN CITRATE 20 MG PO TABS: ORAL_TABLET | ORAL | 2 refills | Status: DC

## 2022-05-17 NOTE — Progress Notes (Signed)
[facility] Spiritual Care Note  Spoke with [name] by phone to confirm Spiritual Care appointment this afternoon, shifting to 12:45pm in light of 1:45pm lab appointment.   [place], [place], [facility] Pager [phone] Voicemail [phone]

## 2022-05-17 NOTE — Progress Notes (Incomplete)
[name]   [phone] [phone]   Clinic Follow up Note   Patient Care Team: Isaac Bliss, Rayford Halsted, MD as PCP - General (Internal Medicine) Coralie Keens, MD as Consulting Physician (General Surgery) Truitt Merle, MD as Consulting Physician (Hematology) Eppie Gibson, MD as Attending Physician (Radiation Oncology) Rutherford Guys, MD as Consulting Physician (Ophthalmology) Rockwell Germany, RN as Oncology Nurse Navigator Mauro Kaufmann, RN as Oncology Nurse Navigator  Date of Service:  05/17/2022  CHIEF COMPLAINT: f/u of left breast cancer  CURRENT THERAPY:  Tamoxifen, 2m, starting 12/2021  ASSESSMENT & PLAN:  Felicia LYBARGERis a 81y.o. post-menopausal female with   1. Malignant neoplasm of upper-outer quadrant of left breast, invasive lobular carcinoma, stage IA, pT1b, cN0, ER+/PR+/HER2-, Grade 2  -found on screening mammogram. S/p left lumpectomy on 09/26/21 by Dr. BNinfa Lindenshowed: 1 cm invasive ductal carcinoma; low grade DCIS; ALH. Margins negative. -she received ultra-hypofractionated radiation therapy under Dr. SIsidore Moos2/27-11/03/21. -she started tamoxifen in late 11/2021, plan for 5 years. She is tolerating well with only mild hot flashes. -she is clinically doing very well. Lab reviewed, overall WNL.  -she will be due for routine mammogram in 07/2022; I ordered today.   2. Bone Health  -Her most recent DEXA was 09/10/18 showing osteoporosis (T-score -4.0) at distal radius -we previously discussed Zometa, Prolia was too expensive. -she is due for repeat, I ordered today to be done with her mammogram.   3. Chronic Pain Syndrome -managed by her PCP, Dr. HIsaac Bliss-she takes hydrocodone 5/325 mg q8hr     PLAN:  -continue tamoxifen -mammogram and DEXA in 07/2022 -lab and f/u in 6 months   No problem-specific Assessment & Plan notes found for this encounter.   SUMMARY OF ONCOLOGIC HISTORY: Oncology History Overview  Note   Cancer Staging  Malignant neoplasm of upper-outer quadrant of left breast in female, estrogen receptor positive ([place] Staging form: Breast, AJCC 8th Edition - Clinical stage from 08/17/2021: Stage IA (cT1c, cN0, cM0, G2, ER+, PR+, HER2-) - Signed by FTruitt Merle MD on 09/18/2021    Malignant neoplasm of upper-outer quadrant of left breast in female, estrogen receptor positive ([place]  08/09/2021 Mammogram   EXAM: DIGITAL DIAGNOSTIC UNILATERAL LEFT MAMMOGRAM WITH TOMOSYNTHESIS AND CAD; ULTRASOUND LEFT BREAST LIMITED  IMPRESSION: 1.  There is a suspicious mass in the left breast at 1 o'clock.   2.  No evidence of left axillary lymphadenopathy.   08/17/2021 Cancer Staging   Staging form: Breast, AJCC 8th Edition - Clinical stage from 08/17/2021: Stage IA (cT1c, cN0, cM0, G2, ER+, PR+, HER2-) - Signed by FTruitt Merle MD on 09/18/2021 Stage prefix: Initial diagnosis Histologic grading system: 3 grade system   08/17/2021 Initial Biopsy   Diagnosis Breast, left, needle core biopsy, left breast 1:00, same - INVASIVE MAMMARY CARCINOMA, GRADE 1/2. - SEE MICROSCOPIC DESCRIPTION. Microscopic Comment The greatest tumor dimension is 1.1 cm .  Addendum: Immunohistochemistry for E-cadherin is negative consistent with lobular carcinoma.  PROGNOSTIC INDICATORS Results: The tumor cells are EQUIVOCAL for Her2 (2+). Her2 by FISH will be performed and results reported separately. Estrogen Receptor: 95%, POSITIVE, STRONG STAINING INTENSITY Progesterone Receptor: 100%, POSITIVE, STRONG STAINING INTENSITY Proliferation Marker Ki67: 5%  FLUORESCENCE IN-SITU HYBRIDIZATION Results: GROUP 5: HER2 **NEGATIVE**   09/18/2021 Initial Diagnosis   Malignant neoplasm of upper-outer quadrant of left breast in female, estrogen receptor positive ([place]   09/26/2021 Cancer Staging   Staging form: Breast, AJCC 8th Edition -  Pathologic stage from 09/26/2021: Stage IA (pT1b, pN0, cM0, G2, ER+, PR+, HER2-) - Signed  by Truitt Merle, MD on 11/10/2021 Stage prefix: Initial diagnosis Histologic grading system: 3 grade system Residual tumor (R): R0 - None   09/26/2021 Definitive Surgery   FINAL MICROSCOPIC DIAGNOSIS:   A. BREAST, LEFT, LUMPECTOMY:  - Invasive ductal carcinoma, 1 cm, grade 2  - Ductal carcinoma in situ, low-grade  - Resection margins are negative for carcinoma - closest is the superior margin at 0.3 cm  - Atypical lobular hyperplasia  - Biopsy site changes  - See oncology table    10/30/2021 - 11/03/2021 Radiation Therapy        INTERVAL HISTORY:  [name] is here for a follow up of breast cancer. She was last seen by me on 11/10/21. She presents to the clinic alone. She reports she is doing well overall. She tells me she is tolerating tamoxifen well with only mild hot flashes. She notes her arthritis is stable, no change on tamoxifen. She adds she is managing her weight with keto diet. She tells me her husband passed away in 27-Dec-2021.   All other systems were reviewed with the patient and are negative.  MEDICAL HISTORY:  Past Medical History:  Diagnosis Date   Allergy    Anxiety    Arthritis    PAIN AND OA RIGHT KNEE; PAIN AND DDD AND SPINAL STENOSIS   Complication of anesthesia    PULLED OUT MY IV'S AND FOLEY - HALLUCINATIONS WITH HIATAL HERNIA SURGERY   Depression    Diabetes mellitus without complication ([place])    Diverticulosis    PER COLONOSCOPY   GERD (gastroesophageal reflux disease)    OVER THE COUNTER MEDS IF NEEDED   Heart murmur    "VERY SLIGHT ALL MY LIFE"   Hyperlipidemia    Hypertension    Sleep apnea    does not tolerate CPAP - HAS NOT USED IN OVER 2 YRS    SURGICAL HISTORY: Past Surgical History:  Procedure Laterality Date   BACK SURGERY     BREAST LUMPECTOMY WITH RADIOACTIVE SEED LOCALIZATION Left 09/26/2021   Procedure: LEFT BREAST LUMPECTOMY WITH RADIOACTIVE SEED LOCALIZATION;  Surgeon: Coralie Keens, MD;  Location: [place];  Service: General;   Laterality: Left;   COLONOSCOPY     EYE SURGERY     bilateral cataracts   HIATAL HERNIA REPAIR  09/03/2006   LUMBAR FUSION     LUMBAR LAMINECTOMY     PARTIAL KNEE ARTHROPLASTY Right 07/02/2014   Procedure: RIGHT MEDIAL UNICOMPARTMENTAL KNEE;  Surgeon: Mauri Pole, MD;  Location: [facility];  Service: Orthopedics;  Laterality: Right;   UPPER GASTROINTESTINAL ENDOSCOPY      I have reviewed the social history and family history with the patient and they are unchanged from previous note.  ALLERGIES:  is allergic to [employer] tartrate], hydrochlorothiazide, prednisone, thiazide-type diuretics, and sulfamethoxazole.  MEDICATIONS:  Current Outpatient Medications  Medication Sig Dispense Refill   Accu-Chek FastClix Lancets MISC 1 each by Does not apply route daily. 100 each 12   ALPRAZolam (XANAX) 0.25 MG tablet TAKE ONE TABLET BY MOUTH TWICE DAILY AS NEEDED FOR ANXIETY 60 tablet 0   amLODipine (NORVASC) 5 MG tablet TAKE ONE TABLET BY MOUTH DAILY 90 tablet 1   Artificial Tear Ointment (DRY EYES OP) Place 1 drop into both eyes at bedtime as needed (Dry eye).     benzonatate (TESSALON PERLES) 100 MG capsule Take 1 capsule (100 mg total)  by mouth 3 (three) times daily as needed. 20 capsule 0   Blood Glucose Monitoring Suppl (ACCU-CHEK GUIDE ME) w/Device KIT 1 each by Does not apply route daily. 1 kit 2   COVID-19 mRNA bivalent vaccine, Pfizer, (PFIZER COVID-19 VAC BIVALENT) injection Inject into the muscle. 0.3 mL 0   COVID-19 mRNA vaccine, Pfizer, 30 MCG/0.3ML injection Inject into the muscle. 0.3 mL 0   diclofenac Sodium (VOLTAREN) 1 % GEL Apply 4 g topically 4 (four) times daily. Apply to bilateral knees.     diphenhydramine-acetaminophen (TYLENOL PM) 25-500 MG TABS tablet Take 2 tablets by mouth at bedtime.     fluocinonide (LIDEX) 0.05 % external solution Apply 1 application topically daily as needed for itching.     fluticasone (CUTIVATE) 0.005 % ointment Apply 1 application topically  daily.     glucose blood (ACCU-CHEK GUIDE) test strip 1 each by Other route daily. Use as instructed 100 each 12   HYDROcodone-acetaminophen (NORCO/VICODIN) 5-325 MG tablet Take 1 tablet by mouth 3 (three) times daily as needed for moderate pain or severe pain. 90 tablet 0   HYDROcodone-acetaminophen (NORCO/VICODIN) 5-325 MG tablet Take 1 tablet by mouth 3 (three) times daily as needed for moderate pain. 90 tablet 0   HYDROcodone-acetaminophen (NORCO/VICODIN) 5-325 MG tablet Take 1 tablet by mouth 3 (three) times daily as needed for moderate pain. 90 tablet 0   ketoconazole (NIZORAL) 2 % shampoo Apply 1 application topically 3 (three) times a week.     lisinopril (ZESTRIL) 5 MG tablet TAKE ONE TABLET BY MOUTH IN THE MORNING AND TAKE ONE TABLET AT BEDTIME 180 tablet 1   meloxicam (MOBIC) 15 MG tablet Take 1 tablet (15 mg total) by mouth daily. 90 tablet 0   mometasone (NASONEX) 50 MCG/ACT nasal spray Place 2 sprays into the nose at bedtime. 17 g 6   oxybutynin (DITROPAN-XL) 10 MG 24 hr tablet Take 1 tablet (10 mg total) by mouth daily. 90 tablet 1   raNITIdine HCl (ZANTAC PO) Take 4 mg by mouth at bedtime.     Sennosides (EX-LAX PO) Take 1 tablet by mouth at bedtime.     simvastatin (ZOCOR) 20 MG tablet Take 1 tablet (20 mg total) by mouth at bedtime. 90 tablet 1   tamoxifen (NOLVADEX) 20 MG tablet TAKE ONE TABLET BY MOUTH DAILY FIRST DOSE START ON 12-02-21 30 tablet 3   triamcinolone (NASACORT) 55 MCG/ACT AERO nasal inhaler Place 2 sprays into the nose daily.     triamcinolone cream (KENALOG) 0.1 % APPLY TO RASH SPARINGLY ONCE A DAY. 30 g 0   No current facility-administered medications for this visit.    PHYSICAL EXAMINATION: ECOG PERFORMANCE STATUS: 0 - Asymptomatic  Vitals:   05/17/22 1431  BP: (!) 167/73  Pulse: 70  Resp: 15  Temp: 98.5 F (36.9 C)  SpO2: 98%   Wt Readings from Last 3 Encounters:  05/17/22 159 lb 12.8 oz (72.5 kg)  02/27/22 164 lb 8 oz (74.6 kg)  02/16/22 160 lb  12.8 oz (72.9 kg)     GENERAL:alert, no distress and comfortable SKIN: skin color normal, no rashes or significant lesions EYES: normal, Conjunctiva are pink and non-injected, sclera clear  NEURO: alert & oriented x 3 with fluent speech  LABORATORY DATA:  I have reviewed the data as listed    Latest Ref Rng & Units 05/17/2022    1:47 PM 09/18/2021    4:00 PM 02/16/2021    8:34 AM  CBC  WBC 4.0 -  10.5 K/uL 6.5  9.3  5.8   Hemoglobin 12.0 - 15.0 g/dL 12.3  13.7  13.6   Hematocrit 36.0 - 46.0 % 36.8  40.0  40.3   Platelets 150 - 400 K/uL 247  288  231.0         Latest Ref Rng & Units 05/17/2022    1:47 PM 09/18/2021    4:00 PM 02/16/2021    8:34 AM  CMP  Glucose 70 - 99 mg/dL 128  117  110   BUN 8 - 23 mg/dL '14  17  17   ' Creatinine 0.44 - 1.00 mg/dL 0.78  0.70  0.78   Sodium 135 - 145 mmol/L 140  139  137   Potassium 3.5 - 5.1 mmol/L 3.7  3.9  3.8   Chloride 98 - 111 mmol/L 107  105  102   CO2 22 - 32 mmol/L '29  25  25   ' Calcium 8.9 - 10.3 mg/dL 9.1  9.8  9.6   Total Protein 6.5 - 8.1 g/dL 6.5  7.7  7.3   Total Bilirubin 0.3 - 1.2 mg/dL 0.3  0.5  0.7   Alkaline Phos 38 - 126 U/L 29  44  36   AST 15 - 41 U/L '14  15  11   ' ALT 0 - 44 U/L '9  10  7       ' RADIOGRAPHIC STUDIES: I have personally reviewed the radiological images as listed and agreed with the findings in the report. No results found.    Orders Placed This Encounter  Procedures   MM DIAG BREAST TOMO BILATERAL    Standing Status:   Future    Standing Expiration Date:   05/18/2023    Order Specific Question:   Reason for Exam (SYMPTOM  OR DIAGNOSIS REQUIRED)    Answer:   screening    Order Specific Question:   Preferred imaging location?    Answer:   [facility]   DG Bone Density    Standing Status:   Future    Standing Expiration Date:   05/17/2023    Order Specific Question:   Reason for Exam (SYMPTOM  OR DIAGNOSIS REQUIRED)    Answer:   screening    Order Specific Question:   Preferred imaging  location?    Answer:   [facility]   All questions were answered. The patient knows to call the clinic with any problems, questions or concerns. No barriers to learning was detected. The total time spent in the appointment was 30 minutes.     Truitt Merle, MD 05/17/2022   I, Wilburn Mylar, am acting as scribe for Truitt Merle, MD.   I have reviewed the above documentation for accuracy and completeness, and I agree with the above.

## 2022-05-18 ENCOUNTER — Telehealth: Payer: Self-pay | Admitting: Hematology

## 2022-05-18 NOTE — Telephone Encounter (Signed)
Scheduled follow-up appointment per 9/14 los. Patient is aware.

## 2022-05-20 ENCOUNTER — Encounter: Payer: Self-pay | Admitting: Hematology

## 2022-05-30 ENCOUNTER — Ambulatory Visit (INDEPENDENT_AMBULATORY_CARE_PROVIDER_SITE_OTHER): Payer: Medicare Other | Admitting: Internal Medicine

## 2022-05-30 ENCOUNTER — Encounter: Payer: Self-pay | Admitting: Internal Medicine

## 2022-05-30 VITALS — BP 124/80 | HR 64 | Temp 98.4°F | Wt 159.1 lb

## 2022-05-30 DIAGNOSIS — E785 Hyperlipidemia, unspecified: Secondary | ICD-10-CM

## 2022-05-30 DIAGNOSIS — I10 Essential (primary) hypertension: Secondary | ICD-10-CM | POA: Diagnosis not present

## 2022-05-30 DIAGNOSIS — C50412 Malignant neoplasm of upper-outer quadrant of left female breast: Secondary | ICD-10-CM | POA: Diagnosis not present

## 2022-05-30 DIAGNOSIS — G894 Chronic pain syndrome: Secondary | ICD-10-CM | POA: Diagnosis not present

## 2022-05-30 DIAGNOSIS — F411 Generalized anxiety disorder: Secondary | ICD-10-CM | POA: Diagnosis not present

## 2022-05-30 DIAGNOSIS — Z23 Encounter for immunization: Secondary | ICD-10-CM

## 2022-05-30 DIAGNOSIS — Z170 Estrogen receptor positive status [ER+]: Secondary | ICD-10-CM

## 2022-05-30 DIAGNOSIS — R7303 Prediabetes: Secondary | ICD-10-CM | POA: Diagnosis not present

## 2022-05-30 LAB — POCT GLYCOSYLATED HEMOGLOBIN (HGB A1C): Hemoglobin A1C: 6.1 % — AB (ref 4.0–5.6)

## 2022-05-30 MED ORDER — ALPRAZOLAM 0.25 MG PO TABS: 0.2500 mg | ORAL_TABLET | Freq: Two times a day (BID) | ORAL | 0 refills | Status: DC | PRN

## 2022-05-30 NOTE — Progress Notes (Signed)
Established Patient Office Visit     CC/Reason for Visit: Follow-up chronic medical conditions  HPI: [name] is a 81 y.o. female who is coming in today for the above mentioned reasons. Past Medical History is significant for: Hypertension, hyperlipidemia, obstructive sleep apnea, chronic pain syndrome on hydrocodone and breast cancer.  She has been doing well and has no acute concerns.  She is requesting a flu vaccine.  She has been trying to lose weight through healthy lifestyle changes.   Past Medical/Surgical History: Past Medical History:  Diagnosis Date   Allergy    Anxiety    Arthritis    PAIN AND OA RIGHT KNEE; PAIN AND DDD AND SPINAL STENOSIS   Complication of anesthesia    PULLED OUT MY IV'S AND FOLEY - HALLUCINATIONS WITH HIATAL HERNIA SURGERY   Depression    Diabetes mellitus without complication ([place])    Diverticulosis    PER COLONOSCOPY   GERD (gastroesophageal reflux disease)    OVER THE COUNTER MEDS IF NEEDED   Heart murmur    "VERY SLIGHT ALL MY LIFE"   Hyperlipidemia    Hypertension    Sleep apnea    does not tolerate CPAP - HAS NOT USED IN OVER 2 YRS    Past Surgical History:  Procedure Laterality Date   BACK SURGERY     BREAST LUMPECTOMY WITH RADIOACTIVE SEED LOCALIZATION Left 09/26/2021   Procedure: LEFT BREAST LUMPECTOMY WITH RADIOACTIVE SEED LOCALIZATION;  Surgeon: Coralie Keens, MD;  Location: [place];  Service: General;  Laterality: Left;   COLONOSCOPY     EYE SURGERY     bilateral cataracts   HIATAL HERNIA REPAIR  09/03/2006   LUMBAR FUSION     LUMBAR LAMINECTOMY     PARTIAL KNEE ARTHROPLASTY Right 07/02/2014   Procedure: RIGHT MEDIAL UNICOMPARTMENTAL KNEE;  Surgeon: Mauri Pole, MD;  Location: [facility];  Service: Orthopedics;  Laterality: Right;   UPPER GASTROINTESTINAL ENDOSCOPY      Social History:  reports that she has never smoked. She has never used smokeless tobacco. She reports that she does not drink alcohol and  does not use drugs.  Allergies: Allergies  Allergen Reactions   Ambien [Zolpidem Tartrate]     amnesia   Hydrochlorothiazide     Other reaction(s): Lethargy (intolerance)   Prednisone     Rapid heart rate   Thiazide-Type Diuretics Other (See Comments)    "could not physically move"   Sulfamethoxazole Rash    Family History:  Family History  Problem Relation Age of Onset   Heart disease Mother    Breast cancer Mother    Heart disease Father    Heart disease Sister    Pulmonary embolism Sister    Diabetes Paternal Grandfather      Current Outpatient Medications:    Accu-Chek FastClix Lancets MISC, 1 each by Does not apply route daily., Disp: 100 each, Rfl: 12   amLODipine (NORVASC) 5 MG tablet, TAKE ONE TABLET BY MOUTH DAILY, Disp: 90 tablet, Rfl: 1   Artificial Tear Ointment (DRY EYES OP), Place 1 drop into both eyes at bedtime as needed (Dry eye)., Disp: , Rfl:    Blood Glucose Monitoring Suppl (ACCU-CHEK GUIDE ME) w/Device KIT, 1 each by Does not apply route daily., Disp: 1 kit, Rfl: 2   diclofenac Sodium (VOLTAREN) 1 % GEL, Apply 4 g topically 4 (four) times daily. Apply to bilateral knees., Disp: , Rfl:    diphenhydramine-acetaminophen (TYLENOL PM) 25-500 MG  TABS tablet, Take 2 tablets by mouth at bedtime., Disp: , Rfl:    fluticasone (CUTIVATE) 0.005 % ointment, Apply 1 application topically daily., Disp: , Rfl:    glucose blood (ACCU-CHEK GUIDE) test strip, 1 each by Other route daily. Use as instructed, Disp: 100 each, Rfl: 12   HYDROcodone-acetaminophen (NORCO/VICODIN) 5-325 MG tablet, Take 1 tablet by mouth 3 (three) times daily as needed for moderate pain., Disp: 90 tablet, Rfl: 0   ketoconazole (NIZORAL) 2 % shampoo, Apply 1 application topically 3 (three) times a week., Disp: , Rfl:    lisinopril (ZESTRIL) 5 MG tablet, TAKE ONE TABLET BY MOUTH IN THE MORNING AND TAKE ONE TABLET AT BEDTIME, Disp: 180 tablet, Rfl: 1   meloxicam (MOBIC) 15 MG tablet, Take 1 tablet (15  mg total) by mouth daily., Disp: 90 tablet, Rfl: 0   mometasone (NASONEX) 50 MCG/ACT nasal spray, Place 2 sprays into the nose at bedtime., Disp: 17 g, Rfl: 6   oxybutynin (DITROPAN-XL) 10 MG 24 hr tablet, Take 1 tablet (10 mg total) by mouth daily., Disp: 90 tablet, Rfl: 1   raNITIdine HCl (ZANTAC PO), Take 4 mg by mouth at bedtime., Disp: , Rfl:    Sennosides (EX-LAX PO), Take 1 tablet by mouth at bedtime., Disp: , Rfl:    simvastatin (ZOCOR) 20 MG tablet, Take 1 tablet (20 mg total) by mouth at bedtime., Disp: 90 tablet, Rfl: 1   tamoxifen (NOLVADEX) 20 MG tablet, TAKE ONE TABLET BY MOUTH DAILY FIRST DOSE START ON 12-02-21, Disp: 90 tablet, Rfl: 2   triamcinolone (NASACORT) 55 MCG/ACT AERO nasal inhaler, Place 2 sprays into the nose daily., Disp: , Rfl:    triamcinolone cream (KENALOG) 0.1 %, APPLY TO RASH SPARINGLY ONCE A DAY., Disp: 30 g, Rfl: 0   ALPRAZolam (XANAX) 0.25 MG tablet, Take 1 tablet (0.25 mg total) by mouth 2 (two) times daily as needed. for anxiety, Disp: 60 tablet, Rfl: 0  Review of Systems:  Constitutional: Denies fever, chills, diaphoresis, appetite change and fatigue.  HEENT: Denies photophobia, eye pain, redness, hearing loss, ear pain, congestion, sore throat, rhinorrhea, sneezing, mouth sores, trouble swallowing, neck pain, neck stiffness and tinnitus.   Respiratory: Denies SOB, DOE, cough, chest tightness,  and wheezing.   Cardiovascular: Denies chest pain, palpitations and leg swelling.  Gastrointestinal: Denies nausea, vomiting, abdominal pain, diarrhea, constipation, blood in stool and abdominal distention.  Genitourinary: Denies dysuria, urgency, frequency, hematuria, flank pain and difficulty urinating.  Endocrine: Denies: hot or cold intolerance, sweats, changes in hair or nails, polyuria, polydipsia. Musculoskeletal: Denies myalgias, back pain, joint swelling, arthralgias and gait problem.  Skin: Denies pallor, rash and wound.  Neurological: Denies dizziness,  seizures, syncope, weakness, light-headedness, numbness and headaches.  Hematological: Denies adenopathy. Easy bruising, personal or family bleeding history  Psychiatric/Behavioral: Denies suicidal ideation, mood changes, confusion, nervousness, sleep disturbance and agitation    Physical Exam: Vitals:   05/30/22 1259  BP: 124/80  Pulse: 64  Temp: 98.4 F (36.9 C)  TempSrc: Oral  SpO2: 97%  Weight: 159 lb 1.6 oz (72.2 kg)    Body mass index is 28.18 kg/m.   Constitutional: NAD, calm, comfortable Eyes: PERRL, lids and conjunctivae normal ENMT: Mucous membranes are moist. Respiratory: clear to auscultation bilaterally, no wheezing, no crackles. Normal respiratory effort. No accessory muscle use.  Cardiovascular: Regular rate and rhythm, no murmurs / rubs / gallops. No extremity edema. Psychiatric: Normal judgment and insight. Alert and oriented x 3. Normal mood.  Impression and Plan:  Pre-diabetes - Plan: POCT glycosylated hemoglobin (Hb A1C)  Generalized anxiety disorder - Plan: ALPRAZolam (XANAX) 0.25 MG tablet  Chronic pain syndrome  Needs flu shot - Plan: Flu Vaccine QUAD High Dose(Fluad)  Essential hypertension  Hyperlipidemia, unspecified hyperlipidemia type  Malignant neoplasm of upper-outer quadrant of left breast in female, estrogen receptor positive ([place])   -Flu vaccine administered in office today. -PDMP reviewed, no red flags, overdose risk score is 150.  Refill alprazolam to take 1 tablet twice daily as needed for total of 60 tablets a month.  She is not yet due for hydrocodone refills but will send once she is. -A1c is 6.1.   -Blood pressure is well controlled. -Cholesterol was at goal at last check. -She is due for CPE and will schedule.   Time spent:30 minutes reviewing chart, interviewing and examining patient and formulating plan of care.     Lelon Frohlich, MD [facility] ([facility]

## 2022-06-24 ENCOUNTER — Other Ambulatory Visit: Payer: Self-pay | Admitting: Internal Medicine

## 2022-06-24 DIAGNOSIS — G894 Chronic pain syndrome: Secondary | ICD-10-CM

## 2022-06-26 NOTE — Telephone Encounter (Signed)
Pt requesting refill of meloxicam (MOBIC) 15 MG tablet   [place], [place] Phone:  [phone]  Fax:  [phone]    Pt has been out for 3 days. Aware that provider is out and requests someone else refill.

## 2022-07-02 ENCOUNTER — Other Ambulatory Visit: Payer: Self-pay | Admitting: Internal Medicine

## 2022-07-02 DIAGNOSIS — F411 Generalized anxiety disorder: Secondary | ICD-10-CM

## 2022-07-12 ENCOUNTER — Ambulatory Visit
Admission: RE | Admit: 2022-07-12 | Discharge: 2022-07-12 | Disposition: A | Discharge status: Home / Self Care | Payer: Medicare Other | Source: Ambulatory Visit | Attending: Hematology | Admitting: Hematology

## 2022-07-12 DIAGNOSIS — Z170 Estrogen receptor positive status [ER+]: Secondary | ICD-10-CM

## 2022-07-12 DIAGNOSIS — Z853 Personal history of malignant neoplasm of breast: Secondary | ICD-10-CM | POA: Diagnosis not present

## 2022-07-16 ENCOUNTER — Other Ambulatory Visit: Payer: Self-pay | Admitting: Internal Medicine

## 2022-07-16 DIAGNOSIS — G894 Chronic pain syndrome: Secondary | ICD-10-CM

## 2022-07-16 MED ORDER — HYDROCODONE-ACETAMINOPHEN 5-325 MG PO TABS: 1.0000 | ORAL_TABLET | Freq: Three times a day (TID) | ORAL | 0 refills | Status: DC

## 2022-07-16 MED ORDER — HYDROCODONE-ACETAMINOPHEN 5-325 MG PO TABS: 1.0000 | ORAL_TABLET | Freq: Four times a day (QID) | ORAL | 0 refills | Status: DC | PRN

## 2022-07-23 ENCOUNTER — Other Ambulatory Visit: Payer: Self-pay | Admitting: Internal Medicine

## 2022-08-01 ENCOUNTER — Other Ambulatory Visit: Payer: Self-pay | Admitting: Internal Medicine

## 2022-08-01 DIAGNOSIS — F411 Generalized anxiety disorder: Secondary | ICD-10-CM

## 2022-08-06 ENCOUNTER — Other Ambulatory Visit: Payer: Self-pay | Admitting: *Deleted

## 2022-08-06 ENCOUNTER — Other Ambulatory Visit: Payer: Self-pay | Admitting: Internal Medicine

## 2022-08-06 DIAGNOSIS — F411 Generalized anxiety disorder: Secondary | ICD-10-CM

## 2022-08-09 ENCOUNTER — Other Ambulatory Visit (HOSPITAL_BASED_OUTPATIENT_CLINIC_OR_DEPARTMENT_OTHER): Payer: Self-pay

## 2022-08-09 DIAGNOSIS — H52203 Unspecified astigmatism, bilateral: Secondary | ICD-10-CM | POA: Diagnosis not present

## 2022-08-09 DIAGNOSIS — E119 Type 2 diabetes mellitus without complications: Secondary | ICD-10-CM | POA: Diagnosis not present

## 2022-08-09 DIAGNOSIS — H5203 Hypermetropia, bilateral: Secondary | ICD-10-CM | POA: Diagnosis not present

## 2022-08-09 DIAGNOSIS — H524 Presbyopia: Secondary | ICD-10-CM | POA: Diagnosis not present

## 2022-08-09 DIAGNOSIS — Z961 Presence of intraocular lens: Secondary | ICD-10-CM | POA: Diagnosis not present

## 2022-08-09 LAB — HM DIABETES EYE EXAM

## 2022-08-09 MED ORDER — COMIRNATY 30 MCG/0.3ML IM SUSY
PREFILLED_SYRINGE | INTRAMUSCULAR | 0 refills | Status: DC
  Filled 2022-08-09: qty 0.3, 1d supply, fill #0

## 2022-08-14 ENCOUNTER — Encounter: Payer: Self-pay | Admitting: Internal Medicine

## 2022-09-06 ENCOUNTER — Encounter: Payer: Self-pay | Admitting: Internal Medicine

## 2022-09-06 ENCOUNTER — Ambulatory Visit (INDEPENDENT_AMBULATORY_CARE_PROVIDER_SITE_OTHER): Payer: Medicare Other | Admitting: Internal Medicine

## 2022-09-06 VITALS — BP 120/84 | HR 75 | Temp 98.1°F | Ht 63.0 in | Wt 153.1 lb

## 2022-09-06 DIAGNOSIS — C50412 Malignant neoplasm of upper-outer quadrant of left female breast: Secondary | ICD-10-CM

## 2022-09-06 DIAGNOSIS — F411 Generalized anxiety disorder: Secondary | ICD-10-CM | POA: Diagnosis not present

## 2022-09-06 DIAGNOSIS — Z170 Estrogen receptor positive status [ER+]: Secondary | ICD-10-CM

## 2022-09-06 DIAGNOSIS — E1139 Type 2 diabetes mellitus with other diabetic ophthalmic complication: Secondary | ICD-10-CM | POA: Diagnosis not present

## 2022-09-06 DIAGNOSIS — Z0000 Encounter for general adult medical examination without abnormal findings: Secondary | ICD-10-CM | POA: Diagnosis not present

## 2022-09-06 DIAGNOSIS — E785 Hyperlipidemia, unspecified: Secondary | ICD-10-CM

## 2022-09-06 DIAGNOSIS — I10 Essential (primary) hypertension: Secondary | ICD-10-CM

## 2022-09-06 DIAGNOSIS — G894 Chronic pain syndrome: Secondary | ICD-10-CM

## 2022-09-06 LAB — CBC WITH DIFFERENTIAL/PLATELET
Basophils Absolute: 0.1 10*3/uL (ref 0.0–0.1)
Basophils Relative: 1.4 % (ref 0.0–3.0)
Eosinophils Absolute: 0.2 10*3/uL (ref 0.0–0.7)
Eosinophils Relative: 2.8 % (ref 0.0–5.0)
HCT: 41.6 % (ref 36.0–46.0)
Hemoglobin: 14 g/dL (ref 12.0–15.0)
Lymphocytes Relative: 33.5 % (ref 12.0–46.0)
Lymphs Abs: 2.2 10*3/uL (ref 0.7–4.0)
MCHC: 33.6 g/dL (ref 30.0–36.0)
MCV: 100.1 fl — ABNORMAL HIGH (ref 78.0–100.0)
Monocytes Absolute: 0.4 10*3/uL (ref 0.1–1.0)
Monocytes Relative: 6.5 % (ref 3.0–12.0)
Neutro Abs: 3.7 10*3/uL (ref 1.4–7.7)
Neutrophils Relative %: 55.8 % (ref 43.0–77.0)
Platelets: 273 10*3/uL (ref 150.0–400.0)
RBC: 4.15 Mil/uL (ref 3.87–5.11)
RDW: 13.2 % (ref 11.5–15.5)
WBC: 6.6 10*3/uL (ref 4.0–10.5)

## 2022-09-06 LAB — LIPID PANEL
Cholesterol: 137 mg/dL (ref 0–200)
HDL: 53.1 mg/dL (ref >39.00)
LDL Cholesterol: 58 mg/dL (ref 0–99)
NonHDL: 83.92
Total CHOL/HDL Ratio: 3
Triglycerides: 131 mg/dL (ref 0.0–149.0)
VLDL: 26.2 mg/dL (ref 0.0–40.0)

## 2022-09-06 LAB — HEMOGLOBIN A1C: Hgb A1c MFr Bld: 6 % (ref 4.6–6.5)

## 2022-09-06 LAB — COMPREHENSIVE METABOLIC PANEL
ALT: 10 U/L (ref 0–35)
AST: 18 U/L (ref 0–37)
Albumin: 4.4 g/dL (ref 3.5–5.2)
Alkaline Phosphatase: 25 U/L — ABNORMAL LOW (ref 39–117)
BUN: 17 mg/dL (ref 6–23)
CO2: 28 mEq/L (ref 19–32)
Calcium: 9.6 mg/dL (ref 8.4–10.5)
Chloride: 102 mEq/L (ref 96–112)
Creatinine, Ser: 0.7 mg/dL (ref 0.40–1.20)
GFR: 80.53 mL/min (ref >60.00)
Glucose, Bld: 117 mg/dL — ABNORMAL HIGH (ref 70–99)
Potassium: 4.2 mEq/L (ref 3.5–5.1)
Sodium: 138 mEq/L (ref 135–145)
Total Bilirubin: 0.5 mg/dL (ref 0.2–1.2)
Total Protein: 7.3 g/dL (ref 6.0–8.3)

## 2022-09-06 MED ORDER — HYDROCODONE-ACETAMINOPHEN 5-325 MG PO TABS: 1.0000 | ORAL_TABLET | Freq: Four times a day (QID) | ORAL | 0 refills | Status: DC | PRN

## 2022-09-06 MED ORDER — SIMVASTATIN 20 MG PO TABS: 20.0000 mg | ORAL_TABLET | Freq: Every day | ORAL | 1 refills | Status: DC

## 2022-09-06 MED ORDER — ALPRAZOLAM 0.25 MG PO TABS: 0.2500 mg | ORAL_TABLET | Freq: Two times a day (BID) | ORAL | 2 refills | Status: DC | PRN

## 2022-09-06 MED ORDER — AMLODIPINE BESYLATE 5 MG PO TABS: ORAL_TABLET | ORAL | 1 refills | Status: DC

## 2022-09-06 MED ORDER — HYDROCODONE-ACETAMINOPHEN 5-325 MG PO TABS: ORAL_TABLET | ORAL | 0 refills | Status: DC

## 2022-09-06 MED ORDER — MELOXICAM 15 MG PO TABS: 15.0000 mg | ORAL_TABLET | Freq: Every day | ORAL | 0 refills | Status: DC

## 2022-09-06 MED ORDER — HYDROCODONE-ACETAMINOPHEN 5-325 MG PO TABS: 1.0000 | ORAL_TABLET | Freq: Three times a day (TID) | ORAL | 0 refills | Status: DC

## 2022-09-06 MED ORDER — LISINOPRIL 5 MG PO TABS: ORAL_TABLET | ORAL | 1 refills | Status: DC

## 2022-09-06 NOTE — Progress Notes (Signed)
Established Patient Office Visit     CC/Reason for Visit: Annual preventive exam and subsequent Medicare wellness visit  HPI: [name] is a 82 y.o. female who is coming in today for the above mentioned reasons. Past Medical History is significant for: Hypertension, hyperlipidemia, generalized anxiety disorder, breast cancer, chronic pain syndrome on hydrocodone, obstructive sleep apnea.  She feels well today.  She has routine eye care, is overdue for dental exam, no perceived hearing difficulties.  She had her flu, COVID and RSV vaccines last fall.  Cancer screening is up-to-date, due for DEXA.   Past Medical/Surgical History: Past Medical History:  Diagnosis Date   Allergy    Anxiety    Arthritis    PAIN AND OA RIGHT KNEE; PAIN AND DDD AND SPINAL STENOSIS   Complication of anesthesia    PULLED OUT MY IV'S AND FOLEY - HALLUCINATIONS WITH HIATAL HERNIA SURGERY   Depression    Diabetes mellitus without complication ([place])    Diverticulosis    PER COLONOSCOPY   GERD (gastroesophageal reflux disease)    OVER THE COUNTER MEDS IF NEEDED   Heart murmur    "VERY SLIGHT ALL MY LIFE"   Hyperlipidemia    Hypertension    Sleep apnea    does not tolerate CPAP - HAS NOT USED IN OVER 2 YRS    Past Surgical History:  Procedure Laterality Date   BACK SURGERY     BREAST LUMPECTOMY WITH RADIOACTIVE SEED LOCALIZATION Left 09/26/2021   Procedure: LEFT BREAST LUMPECTOMY WITH RADIOACTIVE SEED LOCALIZATION;  Surgeon: Coralie Keens, MD;  Location: [facility];  Service: General;  Laterality: Left;   COLONOSCOPY     EYE SURGERY     bilateral cataracts   HIATAL HERNIA REPAIR  09/03/2006   LUMBAR FUSION     LUMBAR LAMINECTOMY     PARTIAL KNEE ARTHROPLASTY Right 07/02/2014   Procedure: RIGHT MEDIAL UNICOMPARTMENTAL KNEE;  Surgeon: Mauri Pole, MD;  Location: [facility];  Service: Orthopedics;  Laterality: Right;   UPPER GASTROINTESTINAL ENDOSCOPY      Social History:  reports that  she has never smoked. She has never used smokeless tobacco. She reports that she does not drink alcohol and does not use drugs.  Allergies: Allergies  Allergen Reactions   Ambien [Zolpidem Tartrate]     amnesia   Hydrochlorothiazide     Other reaction(s): Lethargy (intolerance)   Prednisone     Rapid heart rate   Thiazide-Type Diuretics Other (See Comments)    "could not physically move"   Sulfamethoxazole Rash    Family History:  Family History  Problem Relation Age of Onset   Heart disease Mother    Breast cancer Mother    Heart disease Father    Heart disease Sister    Pulmonary embolism Sister    Diabetes Paternal Grandfather      Current Outpatient Medications:    Accu-Chek FastClix Lancets MISC, 1 each by Does not apply route daily., Disp: 100 each, Rfl: 12   Artificial Tear Ointment (DRY EYES OP), Place 1 drop into both eyes at bedtime as needed (Dry eye)., Disp: , Rfl:    Blood Glucose Monitoring Suppl (ACCU-CHEK GUIDE ME) w/Device KIT, 1 each by Does not apply route daily., Disp: 1 kit, Rfl: 2   COVID-19 mRNA vaccine 2023-2024 (COMIRNATY) syringe, Inject into the muscle., Disp: 0.3 mL, Rfl: 0   diclofenac Sodium (VOLTAREN) 1 % GEL, Apply 4 g topically 4 (four) times daily. Apply  to bilateral knees., Disp: , Rfl:    diphenhydramine-acetaminophen (TYLENOL PM) 25-500 MG TABS tablet, Take 2 tablets by mouth at bedtime., Disp: , Rfl:    fluticasone (CUTIVATE) 0.005 % ointment, Apply 1 application topically daily., Disp: , Rfl:    glucose blood (ACCU-CHEK GUIDE) test strip, 1 each by Other route daily. Use as instructed, Disp: 100 each, Rfl: 12   ketoconazole (NIZORAL) 2 % shampoo, Apply 1 application topically 3 (three) times a week., Disp: , Rfl:    mometasone (NASONEX) 50 MCG/ACT nasal spray, Place 2 sprays into the nose at bedtime., Disp: 17 g, Rfl: 6   oxybutynin (DITROPAN-XL) 10 MG 24 hr tablet, Take 1 tablet (10 mg total) by mouth daily., Disp: 90 tablet, Rfl: 1    raNITIdine HCl (ZANTAC PO), Take 4 mg by mouth at bedtime., Disp: , Rfl:    Sennosides (EX-LAX PO), Take 1 tablet by mouth at bedtime., Disp: , Rfl:    tamoxifen (NOLVADEX) 20 MG tablet, TAKE ONE TABLET BY MOUTH DAILY FIRST DOSE START ON 12-02-21, Disp: 90 tablet, Rfl: 2   triamcinolone (NASACORT) 55 MCG/ACT AERO nasal inhaler, Place 2 sprays into the nose daily., Disp: , Rfl:    triamcinolone cream (KENALOG) 0.1 %, APPLY TO RASH SPARINGLY ONCE DAILY, Disp: 30 g, Rfl: 0   ALPRAZolam (XANAX) 0.25 MG tablet, Take 1 tablet (0.25 mg total) by mouth 2 (two) times daily as needed. for anxiety, Disp: 60 tablet, Rfl: 2   amLODipine (NORVASC) 5 MG tablet, TAKE ONE TABLET BY MOUTH DAILY, Disp: 90 tablet, Rfl: 1   HYDROcodone-acetaminophen (NORCO/VICODIN) 5-325 MG tablet, Take 1 tablet by mouth every 6 (six) hours as needed for moderate pain., Disp: 90 tablet, Rfl: 0   HYDROcodone-acetaminophen (NORCO/VICODIN) 5-325 MG tablet, Take 1 tablet by mouth 3 (three) times daily., Disp: 90 tablet, Rfl: 0   HYDROcodone-acetaminophen (NORCO/VICODIN) 5-325 MG tablet, TAKE ONE TABLET BY MOUTH THREE TIMES DAILY AS NEEDED MODERATE OR FOR SEVERE PAIN, Disp: 90 tablet, Rfl: 0   lisinopril (ZESTRIL) 5 MG tablet, TAKE ONE TABLET BY MOUTH IN THE MORNING AND TAKE ONE TABLET AT BEDTIME, Disp: 180 tablet, Rfl: 1   meloxicam (MOBIC) 15 MG tablet, Take 1 tablet (15 mg total) by mouth daily., Disp: 90 tablet, Rfl: 0   simvastatin (ZOCOR) 20 MG tablet, Take 1 tablet (20 mg total) by mouth at bedtime., Disp: 90 tablet, Rfl: 1  Review of Systems:  Constitutional: Denies fever, chills, diaphoresis, appetite change and fatigue.  HEENT: Denies photophobia, eye pain, redness, hearing loss, ear pain, congestion, sore throat, rhinorrhea, sneezing, mouth sores, trouble swallowing, neck pain, neck stiffness and tinnitus.   Respiratory: Denies SOB, DOE, cough, chest tightness,  and wheezing.   Cardiovascular: Denies chest pain, palpitations and  leg swelling.  Gastrointestinal: Denies nausea, vomiting, abdominal pain, diarrhea, constipation, blood in stool and abdominal distention.  Genitourinary: Denies dysuria, urgency, frequency, hematuria, flank pain and difficulty urinating.  Endocrine: Denies: hot or cold intolerance, sweats, changes in hair or nails, polyuria, polydipsia. Musculoskeletal: Denies myalgias, back pain, joint swelling, arthralgias and gait problem.  Skin: Denies pallor, rash and wound.  Neurological: Denies dizziness, seizures, syncope, weakness, light-headedness, numbness and headaches.  Hematological: Denies adenopathy. Easy bruising, personal or family bleeding history  Psychiatric/Behavioral: Denies suicidal ideation, mood changes, confusion, nervousness, sleep disturbance and agitation    Physical Exam: Vitals:   09/06/22 0937  BP: 120/84  Pulse: 75  Temp: 98.1 F (36.7 C)  TempSrc: Oral  SpO2: 98%  Weight: 153 lb 1.6 oz (69.4 kg)  Height: _0  (1.6 m)    Body mass index is 27.12 kg/m.   Constitutional: NAD, calm, comfortable Eyes: PERRL, lids and conjunctivae normal, wears corrective lenses ENMT: Mucous membranes are moist. Posterior pharynx clear of any exudate or lesions. Normal dentition. Tympanic membrane is pearly white, no erythema or bulging. Neck: normal, supple, no masses, no thyromegaly Respiratory: clear to auscultation bilaterally, no wheezing, no crackles. Normal respiratory effort. No accessory muscle use.  Cardiovascular: Regular rate and rhythm, no murmurs / rubs / gallops. No extremity edema. 2+ pedal pulses. No carotid bruits.  Abdomen: no tenderness, no masses palpated. No hepatosplenomegaly. Bowel sounds positive.  Musculoskeletal: no clubbing / cyanosis. No joint deformity upper and lower extremities. Good ROM, no contractures. Normal muscle tone.  Skin: no rashes, lesions, ulcers. No induration Neurologic: CN 2-12 grossly intact. Sensation intact, DTR normal. Strength 5/5  in all 4.  Psychiatric: Normal judgment and insight. Alert and oriented x 3. Normal mood.   Subsequent Medicare wellness visit   1. Risk factors, based on past  M,S,F -cardiovascular disease risk factors include age, history of hypertension and hyperlipidemia   2.  Physical activities: Sedentary other than activities of daily living   3.  Depression/mood: Stable, not depressed   4.  Hearing: No perceived issues   5.  ADL's: Independent in all ADLs   6.  Fall risk: Low fall risk   7.  Home safety: No problems identified   8.  Height weight, and visual acuity: height and weight as above, vision:  Vision Screening   Right eye Left eye Both eyes  Without correction     With correction 20 60 20 40 20 40     9.  Counseling: Advised to increase physical activity and schedule dental exam   10. Lab orders based on risk factors: Laboratory update will be reviewed   11. Referral : None today   12. Care plan: Follow-up with me in 3 months   13. Cognitive assessment: No cognitive impairment   14. Screening: Patient provided with a written and personalized 5-10 year screening schedule in the AVS. yes   15. Provider List Update: PCP, oncologist  28. Advance Directives: Full code   17. Opioids: Patient is on chronic opioids. She has a signed pain contract with me. Has not displayed any signs of an opioid-use disorder.   [place] Office Visit from 09/06/2022 in [facility] at [place]  PHQ-9 Total Score 4          11/22/2021    1:48 PM 02/20/2022    2:11 PM 02/27/2022    1:51 PM 05/30/2022    1:13 PM 09/06/2022   10:27 AM  [place] in the past year? _1 0 1  Was there an injury with Fall? 0 0 0 0 0  Fall Risk Category Calculator _2 0 2  Fall Risk Category Low Low Low Low Moderate  Patient Fall Risk Level Low fall risk  Low fall risk Low fall risk Moderate fall risk  Patient at Risk for Falls Due to History of fall(s)  Impaired balance/gait No Fall  Risks Impaired balance/gait  Fall risk Follow up Falls evaluation completed  Falls evaluation completed Falls evaluation completed Falls evaluation completed     Impression and Plan:  Encounter for preventive health examination  Generalized anxiety disorder - Plan: ALPRAZolam (XANAX) 0.25 MG tablet  Essential hypertension - Plan: amLODipine (NORVASC)  5 MG tablet, lisinopril (ZESTRIL) 5 MG tablet, CBC with Differential/Platelet, Comprehensive metabolic panel, Comprehensive metabolic panel, CBC with Differential/Platelet  Chronic pain syndrome - Plan: HYDROcodone-acetaminophen (NORCO/VICODIN) 5-325 MG tablet, HYDROcodone-acetaminophen (NORCO/VICODIN) 5-325 MG tablet, HYDROcodone-acetaminophen (NORCO/VICODIN) 5-325 MG tablet, meloxicam (MOBIC) 15 MG tablet  Hyperlipidemia, unspecified hyperlipidemia type - Plan: simvastatin (ZOCOR) 20 MG tablet, Lipid panel, Lipid panel  Malignant neoplasm of upper-outer quadrant of left breast in female, estrogen receptor positive (HCC)  Type 2 diabetes mellitus with other ophthalmic complication, without long-term current use of insulin ([place]) - Plan: Hemoglobin A1c, Hemoglobin A1c  -Recommend routine eye and dental care. -Immunizations: All immunizations are up-to-date -Healthy lifestyle discussed in detail. -Labs to be updated today. -Colon cancer screening: 06/2014 -Breast cancer screening: 07/2022 -Cervical cancer screening: No further due to age -Lung cancer screening: Not applicable -Prostate cancer screening: Not applicable -DEXA: Scheduled for February.   -PDMP reviewed, no red flags, overdose risk score is 100. -Refill hydrocodone 5/325 mg that she takes 1 tablet every 8 hours as needed for pain for total of 90 tablets a month x 3 months.   Lelon Frohlich, MD [facility]

## 2022-09-25 NOTE — Progress Notes (Signed)
This encounter was created in error - please disregard.

## 2022-10-17 ENCOUNTER — Other Ambulatory Visit: Payer: Self-pay | Admitting: Internal Medicine

## 2022-10-17 DIAGNOSIS — N3281 Overactive bladder: Secondary | ICD-10-CM

## 2022-10-18 DIAGNOSIS — L408 Other psoriasis: Secondary | ICD-10-CM | POA: Diagnosis not present

## 2022-10-18 DIAGNOSIS — K130 Diseases of lips: Secondary | ICD-10-CM | POA: Diagnosis not present

## 2022-10-26 ENCOUNTER — Ambulatory Visit
Admission: RE | Admit: 2022-10-26 | Discharge: 2022-10-26 | Disposition: A | Discharge status: Home / Self Care | Payer: Medicare Other | Source: Ambulatory Visit | Attending: Hematology | Admitting: Hematology

## 2022-10-26 DIAGNOSIS — Z780 Asymptomatic menopausal state: Secondary | ICD-10-CM | POA: Diagnosis not present

## 2022-10-26 DIAGNOSIS — M810 Age-related osteoporosis without current pathological fracture: Secondary | ICD-10-CM | POA: Diagnosis not present

## 2022-10-26 DIAGNOSIS — E2839 Other primary ovarian failure: Secondary | ICD-10-CM

## 2022-10-26 DIAGNOSIS — M85851 Other specified disorders of bone density and structure, right thigh: Secondary | ICD-10-CM | POA: Diagnosis not present

## 2022-10-29 ENCOUNTER — Other Ambulatory Visit: Payer: Self-pay

## 2022-10-29 MED ORDER — TAMOXIFEN CITRATE 20 MG PO TABS: ORAL_TABLET | ORAL | 2 refills | Status: DC

## 2022-10-30 ENCOUNTER — Other Ambulatory Visit: Payer: Self-pay

## 2022-10-30 ENCOUNTER — Telehealth: Payer: Self-pay | Admitting: Internal Medicine

## 2022-10-30 MED ORDER — TAMOXIFEN CITRATE 20 MG PO TABS: ORAL_TABLET | ORAL | 2 refills | Status: DC

## 2022-10-30 NOTE — Telephone Encounter (Signed)
meloxicam (MOBIC) 15 MG tablet ALPRAZolam (XANAX) 0.25 MG tablet simvastatin (ZOCOR) 20 MG tablet triamcinolone cream (KENALOG) 0.1 %  lisinopril (ZESTRIL) 5 MG tablet amLODipine (NORVASC) 5 MG tablet oxybutynin (DITROPAN-XL) 10 MG 24 hr tablet HYDROcodone-acetaminophen (NORCO/VICODIN) 5-325 MG tablet

## 2022-11-01 ENCOUNTER — Telehealth: Payer: Self-pay | Admitting: *Deleted

## 2022-11-01 ENCOUNTER — Other Ambulatory Visit: Payer: Self-pay

## 2022-11-01 DIAGNOSIS — G894 Chronic pain syndrome: Secondary | ICD-10-CM

## 2022-11-01 DIAGNOSIS — F411 Generalized anxiety disorder: Secondary | ICD-10-CM

## 2022-11-01 DIAGNOSIS — E785 Hyperlipidemia, unspecified: Secondary | ICD-10-CM

## 2022-11-01 DIAGNOSIS — I10 Essential (primary) hypertension: Secondary | ICD-10-CM

## 2022-11-01 NOTE — Telephone Encounter (Signed)
Left message on machine for patient to return our call. Would the patient like her refills sent to [facility]?

## 2022-11-05 ENCOUNTER — Telehealth: Payer: Self-pay | Admitting: Adult Health

## 2022-11-05 NOTE — Telephone Encounter (Signed)
Rescheduled appointment per provider template. Patient is aware of the changes made to her upcoming appointments.

## 2022-11-15 ENCOUNTER — Other Ambulatory Visit (HOSPITAL_COMMUNITY): Payer: Self-pay

## 2022-11-15 ENCOUNTER — Inpatient Hospital Stay: Payer: Medicare Other | Attending: Adult Health

## 2022-11-15 ENCOUNTER — Other Ambulatory Visit: Payer: Self-pay

## 2022-11-15 ENCOUNTER — Other Ambulatory Visit: Payer: Medicare Other

## 2022-11-15 ENCOUNTER — Telehealth: Payer: Self-pay | Admitting: Hematology

## 2022-11-15 ENCOUNTER — Ambulatory Visit: Payer: Medicare Other | Admitting: Adult Health

## 2022-11-15 ENCOUNTER — Inpatient Hospital Stay: Payer: Medicare Other | Admitting: Adult Health

## 2022-11-15 VITALS — BP 135/76 | HR 63 | Temp 97.9°F | Resp 18 | Wt 157.6 lb

## 2022-11-15 DIAGNOSIS — Z833 Family history of diabetes mellitus: Secondary | ICD-10-CM | POA: Insufficient documentation

## 2022-11-15 DIAGNOSIS — Z888 Allergy status to other drugs, medicaments and biological substances status: Secondary | ICD-10-CM | POA: Insufficient documentation

## 2022-11-15 DIAGNOSIS — Z803 Family history of malignant neoplasm of breast: Secondary | ICD-10-CM | POA: Diagnosis not present

## 2022-11-15 DIAGNOSIS — Z825 Family history of asthma and other chronic lower respiratory diseases: Secondary | ICD-10-CM | POA: Diagnosis not present

## 2022-11-15 DIAGNOSIS — C50412 Malignant neoplasm of upper-outer quadrant of left female breast: Secondary | ICD-10-CM

## 2022-11-15 DIAGNOSIS — Z79810 Long term (current) use of selective estrogen receptor modulators (SERMs): Secondary | ICD-10-CM | POA: Diagnosis not present

## 2022-11-15 DIAGNOSIS — Z170 Estrogen receptor positive status [ER+]: Secondary | ICD-10-CM | POA: Diagnosis not present

## 2022-11-15 DIAGNOSIS — M545 Low back pain, unspecified: Secondary | ICD-10-CM | POA: Diagnosis not present

## 2022-11-15 DIAGNOSIS — Z79899 Other long term (current) drug therapy: Secondary | ICD-10-CM | POA: Diagnosis not present

## 2022-11-15 DIAGNOSIS — Z8249 Family history of ischemic heart disease and other diseases of the circulatory system: Secondary | ICD-10-CM | POA: Diagnosis not present

## 2022-11-15 LAB — CBC WITH DIFFERENTIAL (CANCER CENTER ONLY)
Abs Immature Granulocytes: 0.02 10*3/uL (ref 0.00–0.07)
Basophils Absolute: 0.1 10*3/uL (ref 0.0–0.1)
Basophils Relative: 1 %
Eosinophils Absolute: 0.2 10*3/uL (ref 0.0–0.5)
Eosinophils Relative: 3 %
HCT: 35.5 % — ABNORMAL LOW (ref 36.0–46.0)
Hemoglobin: 12 g/dL (ref 12.0–15.0)
Immature Granulocytes: 0 %
Lymphocytes Relative: 48 %
Lymphs Abs: 2.8 10*3/uL (ref 0.7–4.0)
MCH: 33.1 pg (ref 26.0–34.0)
MCHC: 33.8 g/dL (ref 30.0–36.0)
MCV: 98.1 fL (ref 80.0–100.0)
Monocytes Absolute: 0.4 10*3/uL (ref 0.1–1.0)
Monocytes Relative: 8 %
Neutro Abs: 2.3 10*3/uL (ref 1.7–7.7)
Neutrophils Relative %: 40 %
Platelet Count: 227 10*3/uL (ref 150–400)
RBC: 3.62 MIL/uL — ABNORMAL LOW (ref 3.87–5.11)
RDW: 12.3 % (ref 11.5–15.5)
WBC Count: 5.8 10*3/uL (ref 4.0–10.5)
nRBC: 0 % (ref 0.0–0.2)

## 2022-11-15 LAB — CMP (CANCER CENTER ONLY)
ALT: 7 U/L (ref 0–44)
AST: 13 U/L — ABNORMAL LOW (ref 15–41)
Albumin: 4 g/dL (ref 3.5–5.0)
Alkaline Phosphatase: 26 U/L — ABNORMAL LOW (ref 38–126)
Anion gap: 7 (ref 5–15)
BUN: 18 mg/dL (ref 8–23)
CO2: 28 mmol/L (ref 22–32)
Calcium: 9.1 mg/dL (ref 8.9–10.3)
Chloride: 106 mmol/L (ref 98–111)
Creatinine: 0.78 mg/dL (ref 0.44–1.00)
GFR, Estimated: >60 mL/min (ref >60)
Glucose, Bld: 113 mg/dL — ABNORMAL HIGH (ref 70–99)
Potassium: 4.3 mmol/L (ref 3.5–5.1)
Sodium: 141 mmol/L (ref 135–145)
Total Bilirubin: 0.6 mg/dL (ref 0.3–1.2)
Total Protein: 6.8 g/dL (ref 6.5–8.1)

## 2022-11-15 MED ORDER — TAMOXIFEN CITRATE 20 MG PO TABS
ORAL_TABLET | ORAL | 0 refills | Status: DC
  Filled 2022-11-15: qty 30, 30d supply, fill #0

## 2022-11-15 NOTE — Progress Notes (Signed)
[facility] Follow up:    [name], Felicia Halsted, MD [place] [address]   DIAGNOSIS:  Cancer Staging  Malignant neoplasm of upper-outer quadrant of left breast in female, estrogen receptor positive [facility]) Staging form: Breast, AJCC 8th Edition - Clinical stage from 08/17/2021: Stage IA (cT1c, cN0, cM0, G2, ER+, PR+, HER2-) - Signed by Truitt Merle, MD on 09/18/2021 Stage prefix: Initial diagnosis Histologic grading system: 3 grade system - Pathologic stage from 09/26/2021: Stage IA (pT1b, pN0, cM0, G2, ER+, PR+, HER2-) - Signed by Truitt Merle, MD on 11/10/2021 Stage prefix: Initial diagnosis Histologic grading system: 3 grade system Residual tumor (R): R0 - None   SUMMARY OF ONCOLOGIC HISTORY: Oncology History Overview Note   Cancer Staging  Malignant neoplasm of upper-outer quadrant of left breast in female, estrogen receptor positive ([place]) Staging form: Breast, AJCC 8th Edition - Clinical stage from 08/17/2021: Stage IA (cT1c, cN0, cM0, G2, ER+, PR+, HER2-) - Signed by Truitt Merle, MD on 09/18/2021    Malignant neoplasm of upper-outer quadrant of left breast in female, estrogen receptor positive ([place])  08/09/2021 Mammogram   EXAM: DIGITAL DIAGNOSTIC UNILATERAL LEFT MAMMOGRAM WITH TOMOSYNTHESIS AND CAD; ULTRASOUND LEFT BREAST LIMITED  IMPRESSION: 1.  There is a suspicious mass in the left breast at 1 o'clock.   2.  No evidence of left axillary lymphadenopathy.   08/17/2021 Cancer Staging   Staging form: Breast, AJCC 8th Edition - Clinical stage from 08/17/2021: Stage IA (cT1c, cN0, cM0, G2, ER+, PR+, HER2-) - Signed by Truitt Merle, MD on 09/18/2021 Stage prefix: Initial diagnosis Histologic grading system: 3 grade system   08/17/2021 Initial Biopsy   Diagnosis Breast, left, needle core biopsy, left breast 1:00, same - INVASIVE MAMMARY CARCINOMA, GRADE 1/2. - SEE MICROSCOPIC DESCRIPTION. Microscopic Comment The greatest tumor  dimension is 1.1 cm .  Addendum: Immunohistochemistry for E-cadherin is negative consistent with lobular carcinoma.  PROGNOSTIC INDICATORS Results: The tumor cells are EQUIVOCAL for Her2 (2+). Her2 by FISH will be performed and results reported separately. Estrogen Receptor: 95%, POSITIVE, STRONG STAINING INTENSITY Progesterone Receptor: 100%, POSITIVE, STRONG STAINING INTENSITY Proliferation Marker Ki67: 5%  FLUORESCENCE IN-SITU HYBRIDIZATION Results: GROUP 5: HER2 **NEGATIVE**   09/18/2021 Initial Diagnosis   Malignant neoplasm of upper-outer quadrant of left breast in female, estrogen receptor positive ([place])   09/26/2021 Cancer Staging   Staging form: Breast, AJCC 8th Edition - Pathologic stage from 09/26/2021: Stage IA (pT1b, pN0, cM0, G2, ER+, PR+, HER2-) - Signed by Truitt Merle, MD on 11/10/2021 Stage prefix: Initial diagnosis Histologic grading system: 3 grade system Residual tumor (R): R0 - None   09/26/2021 Definitive Surgery   FINAL MICROSCOPIC DIAGNOSIS:   A. BREAST, LEFT, LUMPECTOMY:  - Invasive ductal carcinoma, 1 cm, grade 2  - Ductal carcinoma in situ, low-grade  - Resection margins are negative for carcinoma - closest is the superior margin at 0.3 cm  - Atypical lobular hyperplasia  - Biopsy site changes  - See oncology table    10/30/2021 - 11/03/2021 Radiation Therapy       CURRENT THERAPY: Tamoxifen  INTERVAL HISTORY: [name] 82 y.o. female returns for f/u of her history of breast cancer.  She continues on Tamoxifen daily with good tolerance.  Her most recent mammogram occurred on July 12, 2022 demonstrating no mammographic evidence of malignancy and breast density category C.  The only issue she notes is some pain in her lower back that has been present for  about a year.  She says that it is relieved with heat to the area and otherwise is feeling well without questions or concerns today.   Patient Active Problem List   Diagnosis Date Noted    Malignant neoplasm of upper-outer quadrant of left breast in female, estrogen receptor positive ([place]) 09/18/2021   DM (diabetes mellitus), type 2 ([place]) 05/02/2020   Pre-diabetes 09/11/2018   Overactive bladder 09/11/2018   GERD (gastroesophageal reflux disease) 05/13/2015   Routine general medical examination at a health care facility 12/23/2014   Generalized anxiety disorder 08/20/2014   Obesity (BMI 30-39.9) 02/12/2014   Hyperlipidemia 04/17/2007   Essential hypertension 04/17/2007   Allergic rhinitis 04/17/2007   CYST/PSEUDOCYST, PANCREAS 04/17/2007   Osteoarthritis 04/17/2007    is allergic to [employer] tartrate], hydrochlorothiazide, prednisone, thiazide-type diuretics, and sulfamethoxazole.  MEDICAL HISTORY: Past Medical History:  Diagnosis Date   Allergy    Anxiety    Arthritis    PAIN AND OA RIGHT KNEE; PAIN AND DDD AND SPINAL STENOSIS   Complication of anesthesia    PULLED OUT MY IV'S AND FOLEY - HALLUCINATIONS WITH HIATAL HERNIA SURGERY   Depression    Diabetes mellitus without complication ([place])    Diverticulosis    PER COLONOSCOPY   GERD (gastroesophageal reflux disease)    OVER THE COUNTER MEDS IF NEEDED   Heart murmur    "VERY SLIGHT ALL MY LIFE"   Hyperlipidemia    Hypertension    Sleep apnea    does not tolerate CPAP - HAS NOT USED IN OVER 2 YRS    SURGICAL HISTORY: Past Surgical History:  Procedure Laterality Date   BACK SURGERY     BREAST LUMPECTOMY WITH RADIOACTIVE SEED LOCALIZATION Left 09/26/2021   Procedure: LEFT BREAST LUMPECTOMY WITH RADIOACTIVE SEED LOCALIZATION;  Surgeon: Coralie Keens, MD;  Location: [place] [facility];  Service: General;  Laterality: Left;   COLONOSCOPY     EYE SURGERY     bilateral cataracts   HIATAL HERNIA REPAIR  09/03/2006   LUMBAR FUSION     LUMBAR LAMINECTOMY     PARTIAL KNEE ARTHROPLASTY Right 07/02/2014   Procedure: RIGHT MEDIAL UNICOMPARTMENTAL KNEE;  Surgeon: Mauri Pole, MD;  Location: [facility];  Service:  Orthopedics;  Laterality: Right;   UPPER GASTROINTESTINAL ENDOSCOPY      SOCIAL HISTORY: Social History   Socioeconomic History   Marital status: Married    Spouse name: Not on file   Number of children: 2   Years of education: Not on file   Highest education level: Some college, no degree  Occupational History   Not on file  Tobacco Use   Smoking status: Never   Smokeless tobacco: Never  Vaping Use   Vaping Use: Never used  Substance and Sexual Activity   Alcohol use: No   Drug use: No   Sexual activity: Yes    Birth control/protection: Post-menopausal  Other Topics Concern   Not on file  Social History Narrative   Not on file   Social Determinants of Health   Financial Resource Strain: Low Risk  (02/20/2022)   Overall Financial Resource Strain (CARDIA)    Difficulty of Paying Living Expenses: Not very hard  Food Insecurity: No Food Insecurity (02/20/2022)   Hunger Vital Sign    Worried About Running Out of Food in the Last Year: Never true    Ran Out of Food in the Last Year: Never true  Transportation Needs: No Transportation Needs (02/20/2022)   PRAPARE - Transportation  Lack of Transportation (Medical): No    Lack of Transportation (Non-Medical): No  Physical Activity: Insufficiently Active (02/20/2022)   Exercise Vital Sign    Days of Exercise per Week: 1 day    Minutes of Exercise per Session: 20 min  Stress: No Stress Concern Present (02/20/2022)   [place]    Feeling of Stress : Not at all  Social Connections: Moderately Integrated (02/20/2022)   Social Connection and Isolation Panel [NHANES]    Frequency of Communication with Friends and Family: More than three times a week    Frequency of Social Gatherings with Friends and Family: Twice a week    Attends Religious Services: More than 4 times per year    Active Member of [employer] or Organizations: Yes    Attends Archivist  Meetings: 1 to 4 times per year    Marital Status: Widowed  Human resources officer Violence: Not on file    FAMILY HISTORY: Family History  Problem Relation Age of Onset   Heart disease Mother    Breast cancer Mother    Heart disease Father    Heart disease Sister    Pulmonary embolism Sister    Diabetes Paternal Grandfather     Review of Systems  Constitutional:  Negative for appetite change, chills, fatigue, fever and unexpected weight change.  HENT:   Negative for hearing loss, lump/mass and trouble swallowing.   Eyes:  Negative for eye problems and icterus.  Respiratory:  Negative for chest tightness, cough and shortness of breath.   Cardiovascular:  Negative for chest pain, leg swelling and palpitations.  Gastrointestinal:  Negative for abdominal distention, abdominal pain, constipation, diarrhea, nausea and vomiting.  Endocrine: Negative for hot flashes.  Genitourinary:  Negative for difficulty urinating.   Musculoskeletal:  Positive for back pain (per interval history). Negative for arthralgias.  Skin:  Negative for itching and rash.  Neurological:  Negative for dizziness, extremity weakness, headaches and numbness.  Hematological:  Negative for adenopathy. Does not bruise/bleed easily.  Psychiatric/Behavioral:  Negative for depression. The patient is not nervous/anxious.       PHYSICAL EXAMINATION  ECOG PERFORMANCE STATUS: 1 - Symptomatic but completely ambulatory  Vitals:   11/15/22 1157  BP: 135/76  Pulse: 63  Resp: 18  Temp: 97.9 F (36.6 C)  SpO2: 99%    Physical Exam Constitutional:      General: She is not in acute distress.    Appearance: Normal appearance. She is not toxic-appearing.  HENT:     Head: Normocephalic and atraumatic.  Eyes:     General: No scleral icterus. Cardiovascular:     Rate and Rhythm: Normal rate and regular rhythm.     Pulses: Normal pulses.     Heart sounds: Normal heart sounds.  Pulmonary:     Effort: Pulmonary effort is  normal.     Breath sounds: Normal breath sounds.  Chest:     Comments: Left breast status postlumpectomy and radiation no sign of local recurrence right breast is benign. Abdominal:     General: Abdomen is flat. Bowel sounds are normal. There is no distension.     Palpations: Abdomen is soft.     Tenderness: There is no abdominal tenderness.  Musculoskeletal:        General: No swelling.     Cervical back: Neck supple.  Lymphadenopathy:     Cervical: No cervical adenopathy.  Skin:    General: Skin is warm and dry.  Findings: No rash.  Neurological:     General: No focal deficit present.     Mental Status: She is alert.  Psychiatric:        Mood and Affect: Mood normal.        Behavior: Behavior normal.     LABORATORY DATA:  CBC    Component Value Date/Time   WBC 5.8 11/15/2022 1054   WBC 6.6 09/06/2022 1027   RBC 3.62 (L) 11/15/2022 1054   HGB 12.0 11/15/2022 1054   HCT 35.5 (L) 11/15/2022 1054   PLT 227 11/15/2022 1054   MCV 98.1 11/15/2022 1054   MCH 33.1 11/15/2022 1054   MCHC 33.8 11/15/2022 1054   RDW 12.3 11/15/2022 1054   LYMPHSABS 2.8 11/15/2022 1054   MONOABS 0.4 11/15/2022 1054   EOSABS 0.2 11/15/2022 1054   BASOSABS 0.1 11/15/2022 1054    CMP     Component Value Date/Time   NA 141 11/15/2022 1054   K 4.3 11/15/2022 1054   CL 106 11/15/2022 1054   CO2 28 11/15/2022 1054   GLUCOSE 113 (H) 11/15/2022 1054   BUN 18 11/15/2022 1054   CREATININE 0.78 11/15/2022 1054   CALCIUM 9.1 11/15/2022 1054   PROT 6.8 11/15/2022 1054   ALBUMIN 4.0 11/15/2022 1054   AST 13 (L) 11/15/2022 1054   ALT 7 11/15/2022 1054   ALKPHOS 26 (L) 11/15/2022 1054   BILITOT 0.6 11/15/2022 1054   GFRNONAA >60 11/15/2022 1054   GFRAA >90 07/03/2014 0400       ASSESSMENT and THERAPY PLAN:   Malignant neoplasm of upper-outer quadrant of left breast in female, estrogen receptor positive ([place]) [name] is an 82 year old woman with stage Ia left-sided ER/PR positive breast  cancer diagnosed in December 2022.  She is status postlumpectomy followed by adjuvant radiation and antiestrogen therapy with tamoxifen which she began in 2023.  [name] has no clinical or radiographic signs of breast cancer recurrence.  She will repeat mammograms every year next due in November 2024.  She will continue tamoxifen daily.  I offered to obtain plain films of her lumbar spine to evaluate the lower back pain however she declined today.  If it worsens she will let us know and we can order imaging.  I gave her information about bone health today.  I counseled her that tamoxifen helps with her bones.  I recommended healthy diet and exercise as she is able.  We will see her back in 6 months for continued follow-up.   All questions were answered. The patient knows to call the clinic with any problems, questions or concerns. We can certainly see the patient much sooner if necessary.  Total encounter time:30 minutes*in face-to-face visit time, chart review, lab review, care coordination, order entry, and documentation of the encounter time.    Wilber Bihari, NP 11/19/22 2:33 PM Medical Oncology and Hematology [facility] [place], [place] [address] Tel. [phone]    Fax. [phone]  *Total Encounter Time as defined by the Centers for Medicare and Medicaid Services includes, in addition to the face-to-face time of a patient visit (documented in the note above) non-face-to-face time: obtaining and reviewing outside history, ordering and reviewing medications, tests or procedures, care coordination (communications with other health care professionals or caregivers) and documentation in the medical record.

## 2022-11-15 NOTE — Patient Instructions (Signed)
Bone Health Bones protect organs, store calcium, anchor muscles, and support the whole body. Keeping your bones strong is important, especially as you get older. You can take actions to help keep your bones strong and healthy. Why is keeping my bones healthy important?  Keeping your bones healthy is important because your body constantly replaces bone cells. Cells get old, and new cells take their place. As we age, we lose bone cells because the body may not be able to make enough new cells to replace the old cells. The amount of bone cells and bone tissue you have is referred to as bone mass. The higher your bone mass, the stronger your bones. The aging process leads to an overall loss of bone mass in the body, which can increase the likelihood of: Broken bones. A condition in which the bones become weak and brittle (osteoporosis). A large decline in bone mass occurs in older adults. In women, it occurs about the time of menopause. What actions can I take to keep my bones healthy? Good health habits are important for maintaining healthy bones. This includes eating nutritious foods and exercising regularly. To have healthy bones, you need to get enough of the right minerals and vitamins. Most nutrition experts recommend getting these nutrients from the foods that you eat. In some cases, taking supplements may also be recommended. Doing certain types of exercise is also important for bone health. What are the nutritional recommendations for healthy bones?  Eating a well-balanced diet with plenty of calcium and vitamin D will help to protect your bones. Nutritional recommendations vary from person to person. Ask your health care provider what is healthy for you. Here are some general guidelines. Get enough calcium Calcium is the most important (essential) mineral for bone health. Most people can get enough calcium from their diet, but supplements may be recommended for people who are at risk for  osteoporosis. Good sources of calcium include: Dairy products, such as low-fat or nonfat milk, cheese, and yogurt. Dark green leafy vegetables, such as bok choy and broccoli. Foods that have calcium added to them (are fortified). Foods that may be fortified with calcium include orange juice, cereal, bread, soy beverages, and tofu products. Nuts, such as almonds. Follow these recommended amounts for daily calcium intake: Infants, 0-6 months: 200 mg. Infants, 6-12 months: 260 mg. Children, age 1-3: 700 mg. Children, age 4-8: 1,000 mg. Children, age 9-13: 1,300 mg. Teens, age 14-18: 1,300 mg. Adults, age 19-50: 1,000 mg. Adults, age 51-70: Men: 1,000 mg. Women: 1,200 mg. Adults, age 71 or older: 1,200 mg. Pregnant and breastfeeding females: Teens: 1,300 mg. Adults: 1,000 mg. Get enough vitamin D Vitamin D is the most essential vitamin for bone health. It helps the body absorb calcium. Sunlight stimulates the skin to make vitamin D, so be sure to get enough sunlight. If you live in a cold climate or you do not get outside often, your health care provider may recommend that you take vitamin D supplements. Good sources of vitamin D in your diet include: Egg yolks. Saltwater fish. Milk and cereal fortified with vitamin D. Follow these recommended amounts for daily vitamin D intake: Infants, 0-12 months: 400 international units (IU). Children and teens, age 1-18: 600 international units. Adults, age 59 or younger: 600 international units. Adults, age 60 or older: 600-1,000 international units. Get other important nutrients Other nutrients that are important for bone health include: Phosphorus. This mineral is found in meat, poultry, dairy foods, nuts, and legumes. The   recommended daily intake for adult men and adult women is 700 mg. Magnesium. This mineral is found in seeds, nuts, dark green vegetables, and legumes. The recommended daily intake for adult men is 400-420 mg. For adult women,  it is 310-320 mg. Vitamin K. This vitamin is found in green leafy vegetables. The recommended daily intake is 120 mcg for adult men and 90 mcg for adult women. What type of physical activity is best for building and maintaining healthy bones? Weight-bearing and strength-building activities are important for building and maintaining healthy bones. Weight-bearing activities cause muscles and bones to work against gravity. Strength-building activities increase the strength of the muscles that support bones. Weight-bearing and muscle-building activities include: Walking and hiking. Jogging and running. Dancing. Gym exercises. Lifting weights. Tennis and racquetball. Climbing stairs. Aerobics. Adults should get at least 30 minutes of moderate physical activity on most days. Children should get at least 60 minutes of moderate physical activity on most days. Ask your health care provider what type of exercise is best for you. How can I find out if my bone mass is low? Bone mass can be measured with an X-ray test called a bone mineral density (BMD) test. This test is recommended for all women who are age 65 or older. It may also be recommended for: Men who are age 70 or older. People who are at risk for osteoporosis because of: Having a long-term disease that weakens bones, such as kidney disease or rheumatoid arthritis. Having menopause earlier than normal. Taking medicine that weakens bones, such as steroids, thyroid hormones, or hormone treatment for breast cancer or prostate cancer. Smoking. Drinking three or more alcoholic drinks a day. Being underweight. Sedentary lifestyle. If you find that you have a low bone mass, you may be able to prevent osteoporosis or further bone loss by changing your diet and lifestyle. Where can I find more information? Bone Health & Osteoporosis Foundation: www.nof.org/patients National Institutes of Health: www.bones.nih.gov International Osteoporosis  Foundation: www.iofbonehealth.org Summary The aging process leads to an overall loss of bone mass in the body, which can increase the likelihood of broken bones and osteoporosis. Eating a well-balanced diet with plenty of calcium and vitamin D will help to protect your bones. Weight-bearing and strength-building activities are also important for building and maintaining strong bones. Bone mass can be measured with an X-ray test called a bone mineral density (BMD) test. This information is not intended to replace advice given to you by your health care provider. Make sure you discuss any questions you have with your health care provider. Document Revised: 02/01/2021 Document Reviewed: 02/01/2021 Elsevier Patient Education  2023 Elsevier Inc.  

## 2022-11-15 NOTE — Telephone Encounter (Signed)
Per 3/14 LOS reached out to patient to schedule, left voicemail.

## 2022-11-19 ENCOUNTER — Encounter: Payer: Self-pay | Admitting: Adult Health

## 2022-11-19 NOTE — Assessment & Plan Note (Signed)
[name] is an 82 year old woman with stage Ia left-sided ER/PR positive breast cancer diagnosed in December 2022.  She is status postlumpectomy followed by adjuvant radiation and antiestrogen therapy with tamoxifen which she began in 2023.  [name] has no clinical or radiographic signs of breast cancer recurrence.  She will repeat mammograms every year next due in November 2024.  She will continue tamoxifen daily.  I offered to obtain plain films of her lumbar spine to evaluate the lower back pain however she declined today.  If it worsens she will let us know and we can order imaging.  I gave her information about bone health today.  I counseled her that tamoxifen helps with her bones.  I recommended healthy diet and exercise as she is able.  We will see her back in 6 months for continued follow-up.

## 2022-11-27 ENCOUNTER — Telehealth: Payer: Self-pay | Admitting: Internal Medicine

## 2022-11-27 NOTE — Telephone Encounter (Signed)
Patient returned call for results. °

## 2022-12-03 MED ORDER — SIMVASTATIN 20 MG PO TABS: 20.0000 mg | ORAL_TABLET | Freq: Every day | ORAL | 1 refills | Status: DC

## 2022-12-03 MED ORDER — LISINOPRIL 5 MG PO TABS: ORAL_TABLET | ORAL | 1 refills | Status: DC

## 2022-12-03 MED ORDER — AMLODIPINE BESYLATE 5 MG PO TABS: ORAL_TABLET | ORAL | 1 refills | Status: DC

## 2022-12-03 NOTE — Telephone Encounter (Signed)
Spoke with patient and she would like her refills sent to [facility].

## 2022-12-03 NOTE — Addendum Note (Signed)
Addended by: Westley Hummer B on: 12/03/2022 10:30 AM   Modules accepted: Orders

## 2022-12-05 ENCOUNTER — Encounter: Payer: Self-pay | Admitting: Internal Medicine

## 2022-12-05 ENCOUNTER — Ambulatory Visit (INDEPENDENT_AMBULATORY_CARE_PROVIDER_SITE_OTHER): Payer: Medicare Other | Admitting: Internal Medicine

## 2022-12-05 VITALS — BP 153/75 | HR 77 | Temp 99.0°F | Wt 157.5 lb

## 2022-12-05 DIAGNOSIS — G894 Chronic pain syndrome: Secondary | ICD-10-CM

## 2022-12-05 DIAGNOSIS — N3281 Overactive bladder: Secondary | ICD-10-CM | POA: Diagnosis not present

## 2022-12-05 DIAGNOSIS — I10 Essential (primary) hypertension: Secondary | ICD-10-CM

## 2022-12-05 DIAGNOSIS — C50412 Malignant neoplasm of upper-outer quadrant of left female breast: Secondary | ICD-10-CM | POA: Diagnosis not present

## 2022-12-05 DIAGNOSIS — F411 Generalized anxiety disorder: Secondary | ICD-10-CM | POA: Diagnosis not present

## 2022-12-05 DIAGNOSIS — E1139 Type 2 diabetes mellitus with other diabetic ophthalmic complication: Secondary | ICD-10-CM

## 2022-12-05 DIAGNOSIS — Z170 Estrogen receptor positive status [ER+]: Secondary | ICD-10-CM | POA: Diagnosis not present

## 2022-12-05 DIAGNOSIS — E785 Hyperlipidemia, unspecified: Secondary | ICD-10-CM

## 2022-12-05 LAB — POCT GLYCOSYLATED HEMOGLOBIN (HGB A1C): Hemoglobin A1C: 6.2 % — AB (ref 4.0–5.6)

## 2022-12-05 MED ORDER — HYDROCODONE-ACETAMINOPHEN 5-325 MG PO TABS: 1.0000 | ORAL_TABLET | Freq: Three times a day (TID) | ORAL | 0 refills | Status: DC

## 2022-12-05 MED ORDER — HYDROCODONE-ACETAMINOPHEN 5-325 MG PO TABS: 1.0000 | ORAL_TABLET | Freq: Four times a day (QID) | ORAL | 0 refills | Status: DC | PRN

## 2022-12-05 MED ORDER — HYDROCODONE-ACETAMINOPHEN 5-325 MG PO TABS: ORAL_TABLET | ORAL | 0 refills | Status: DC

## 2022-12-05 MED ORDER — OXYBUTYNIN CHLORIDE ER 10 MG PO TB24: 10.0000 mg | ORAL_TABLET | Freq: Every day | ORAL | 1 refills | Status: DC

## 2022-12-05 MED ORDER — ALPRAZOLAM 0.25 MG PO TABS: 0.2500 mg | ORAL_TABLET | Freq: Two times a day (BID) | ORAL | 2 refills | Status: DC | PRN

## 2022-12-05 MED ORDER — MELOXICAM 15 MG PO TABS: 15.0000 mg | ORAL_TABLET | Freq: Every day | ORAL | 0 refills | Status: DC

## 2022-12-05 NOTE — Progress Notes (Signed)
Established Patient Office Visit     CC/Reason for Visit: Follow-up chronic conditions  HPI: [name] is a 82 y.o. female who is coming in today for the above mentioned reasons. Past Medical History is significant for: Hypertension, hyperlipidemia, type 2 diabetes, history of breast cancer, generalized anxiety disorder, obstructive sleep apnea, chronic pain syndrome on hydrocodone.  She is feeling well.  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  She has been dealing with a very difficult patient with combative Alzheimer's.   Past Medical/Surgical History: Past Medical History:  Diagnosis Date   Allergy    Anxiety    Arthritis    PAIN AND OA RIGHT KNEE; PAIN AND DDD AND SPINAL STENOSIS   Complication of anesthesia    PULLED OUT MY IV'S AND FOLEY - HALLUCINATIONS WITH HIATAL HERNIA SURGERY   Depression    Diabetes mellitus without complication    Diverticulosis    PER COLONOSCOPY   GERD (gastroesophageal reflux disease)    OVER THE COUNTER MEDS IF NEEDED   Heart murmur    "VERY SLIGHT ALL MY LIFE"   Hyperlipidemia    Hypertension    Sleep apnea    does not tolerate CPAP - HAS NOT USED IN OVER 2 YRS    Past Surgical History:  Procedure Laterality Date   BACK SURGERY     BREAST LUMPECTOMY WITH RADIOACTIVE SEED LOCALIZATION Left 09/26/2021   Procedure: LEFT BREAST LUMPECTOMY WITH RADIOACTIVE SEED LOCALIZATION;  Surgeon: Coralie Keens, MD;  Location: [facility];  Service: General;  Laterality: Left;   COLONOSCOPY     EYE SURGERY     bilateral cataracts   HIATAL HERNIA REPAIR  09/03/2006   LUMBAR FUSION     LUMBAR LAMINECTOMY     PARTIAL KNEE ARTHROPLASTY Right 07/02/2014   Procedure: RIGHT MEDIAL UNICOMPARTMENTAL KNEE;  Surgeon: Mauri Pole, MD;  Location: [facility];  Service: Orthopedics;  Laterality: Right;   UPPER GASTROINTESTINAL ENDOSCOPY      Social History:  reports that she has never smoked. She has never used smokeless tobacco. She  reports that she does not drink alcohol and does not use drugs.  Allergies: Allergies  Allergen Reactions   Ambien [Zolpidem Tartrate]     amnesia   Hydrochlorothiazide     Other reaction(s): Lethargy (intolerance)   Prednisone     Rapid heart rate   Thiazide-Type Diuretics Other (See Comments)    "could not physically move"   Sulfamethoxazole Rash    Family History:  Family History  Problem Relation Age of Onset   Heart disease Mother    Breast cancer Mother    Heart disease Father    Heart disease Sister    Pulmonary embolism Sister    Diabetes Paternal Grandfather      Current Outpatient Medications:    Accu-Chek FastClix Lancets MISC, 1 each by Does not apply route daily., Disp: 100 each, Rfl: 12   amLODipine (NORVASC) 5 MG tablet, TAKE ONE TABLET BY MOUTH DAILY, Disp: 90 tablet, Rfl: 1   Artificial Tear Ointment (DRY EYES OP), Place 1 drop into both eyes at bedtime as needed (Dry eye)., Disp: , Rfl:    Blood Glucose Monitoring Suppl (ACCU-CHEK GUIDE ME) w/Device KIT, 1 each by Does not apply route daily., Disp: 1 kit, Rfl: 2   COVID-19 mRNA vaccine 2023-2024 (COMIRNATY) syringe, Inject into the muscle., Disp: 0.3 mL, Rfl: 0   diclofenac Sodium (VOLTAREN) 1 % GEL, Apply 4  g topically 4 (four) times daily. Apply to bilateral knees., Disp: , Rfl:    diphenhydramine-acetaminophen (TYLENOL PM) 25-500 MG TABS tablet, Take 2 tablets by mouth at bedtime., Disp: , Rfl:    fluticasone (CUTIVATE) 0.005 % ointment, Apply 1 application topically daily., Disp: , Rfl:    glucose blood (ACCU-CHEK GUIDE) test strip, 1 each by Other route daily. Use as instructed, Disp: 100 each, Rfl: 12   ketoconazole (NIZORAL) 2 % shampoo, Apply 1 application topically 3 (three) times a week., Disp: , Rfl:    lisinopril (ZESTRIL) 5 MG tablet, TAKE ONE TABLET BY MOUTH IN THE MORNING AND TAKE ONE TABLET AT BEDTIME, Disp: 180 tablet, Rfl: 1   mometasone (NASONEX) 50 MCG/ACT nasal spray, Place 2 sprays into  the nose at bedtime., Disp: 17 g, Rfl: 6   raNITIdine HCl (ZANTAC PO), Take 4 mg by mouth at bedtime., Disp: , Rfl:    Sennosides (EX-LAX PO), Take 1 tablet by mouth at bedtime., Disp: , Rfl:    simvastatin (ZOCOR) 20 MG tablet, Take 1 tablet (20 mg total) by mouth at bedtime., Disp: 90 tablet, Rfl: 1   tamoxifen (NOLVADEX) 20 MG tablet, TAKE ONE TABLET BY MOUTH DAILY FIRST DOSE START ON 12-02-21, Disp: 30 tablet, Rfl: 0   triamcinolone (NASACORT) 55 MCG/ACT AERO nasal inhaler, Place 2 sprays into the nose daily., Disp: , Rfl:    triamcinolone cream (KENALOG) 0.1 %, APPLY TO RASH SPARINGLY ONCE DAILY, Disp: 30 g, Rfl: 0   ALPRAZolam (XANAX) 0.25 MG tablet, Take 1 tablet (0.25 mg total) by mouth 2 (two) times daily as needed. for anxiety, Disp: 60 tablet, Rfl: 2   HYDROcodone-acetaminophen (NORCO/VICODIN) 5-325 MG tablet, Take 1 tablet by mouth every 6 (six) hours as needed for moderate pain., Disp: 90 tablet, Rfl: 0   HYDROcodone-acetaminophen (NORCO/VICODIN) 5-325 MG tablet, Take 1 tablet by mouth 3 (three) times daily., Disp: 90 tablet, Rfl: 0   HYDROcodone-acetaminophen (NORCO/VICODIN) 5-325 MG tablet, TAKE ONE TABLET BY MOUTH THREE TIMES DAILY AS NEEDED MODERATE OR FOR SEVERE PAIN, Disp: 90 tablet, Rfl: 0   meloxicam (MOBIC) 15 MG tablet, Take 1 tablet (15 mg total) by mouth daily., Disp: 90 tablet, Rfl: 0   oxybutynin (DITROPAN-XL) 10 MG 24 hr tablet, Take 1 tablet (10 mg total) by mouth daily., Disp: 90 tablet, Rfl: 1  Review of Systems:  Negative unless indicated in HPI.   Physical Exam: Vitals:   12/05/22 1327 12/05/22 1328  BP: (!) 155/80 (!) 153/75  Pulse: 77   Temp: 99 F (37.2 C)   TempSrc: Oral   SpO2: 99%   Weight: 157 lb 8 oz (71.4 kg)     Body mass index is 27.9 kg/m.   Physical Exam Vitals reviewed.  Constitutional:      Appearance: Normal appearance.  HENT:     Head: Normocephalic and atraumatic.  Eyes:     Conjunctiva/sclera: Conjunctivae normal.     Pupils:  Pupils are equal, round, and reactive to light.  Cardiovascular:     Rate and Rhythm: Normal rate and regular rhythm.  Pulmonary:     Effort: Pulmonary effort is normal.     Breath sounds: Normal breath sounds.  Skin:    General: Skin is warm and dry.  Neurological:     General: No focal deficit present.     Mental Status: She is alert and oriented to person, place, and time.  Psychiatric:        Mood and Affect: Mood  normal.        Behavior: Behavior normal.        Thought Content: Thought content normal.        Judgment: Judgment normal.      Impression and Plan:  Type 2 diabetes mellitus with other ophthalmic complication, without long-term current use of insulin - Plan: POC HgB A1c  Generalized anxiety disorder - Plan: ALPRAZolam (XANAX) 0.25 MG tablet  Chronic pain syndrome - Plan: HYDROcodone-acetaminophen (NORCO/VICODIN) 5-325 MG tablet, HYDROcodone-acetaminophen (NORCO/VICODIN) 5-325 MG tablet, HYDROcodone-acetaminophen (NORCO/VICODIN) 5-325 MG tablet, meloxicam (MOBIC) 15 MG tablet  Overactive bladder - Plan: oxybutynin (DITROPAN-XL) 10 MG 24 hr tablet  Essential hypertension  Hyperlipidemia, unspecified hyperlipidemia type  Malignant neoplasm of upper-outer quadrant of left breast in female, estrogen receptor positive  -Blood pressure is elevated today, she believes due to the stressors of her new job, she will continue to monitor and notify me of any changes. -PDMP reviewed, no red flags, overdose risk score is 70.  Refill hydrocodone 5/325 mg to take 1 tablet 3 times a day as needed for total of 90 tablets a month x 3 months. -A1c of 6.2 demonstrates excellent diabetic management. -LDL was at goal of 58 as of last check in January.  Time spent:31 minutes reviewing chart, interviewing and examining patient and formulating plan of care.     Lelon Frohlich, MD  Primary Care at [facility]

## 2022-12-18 ENCOUNTER — Other Ambulatory Visit: Payer: Self-pay | Admitting: Hematology

## 2022-12-18 ENCOUNTER — Other Ambulatory Visit: Payer: Self-pay

## 2022-12-18 DIAGNOSIS — Z170 Estrogen receptor positive status [ER+]: Secondary | ICD-10-CM

## 2022-12-18 MED ORDER — TAMOXIFEN CITRATE 20 MG PO TABS
ORAL_TABLET | ORAL | 3 refills | Status: DC
Start: 2022-12-18 — End: 2023-04-22

## 2022-12-18 NOTE — Progress Notes (Signed)
Spoke with pt via telephone to confirm where she wants her Tamoxifen prescriptions to be sent.  Pt stated she wants her Tamoxifen prescriptions to be refilled at [facility].  Refilled pt's Tamoxifen to [facility].

## 2023-01-14 ENCOUNTER — Encounter: Payer: Self-pay | Admitting: Internal Medicine

## 2023-01-14 ENCOUNTER — Ambulatory Visit (INDEPENDENT_AMBULATORY_CARE_PROVIDER_SITE_OTHER): Payer: Medicare Other | Admitting: Internal Medicine

## 2023-01-14 VITALS — BP 124/80 | HR 84 | Temp 100.2°F | Wt 159.0 lb

## 2023-01-14 DIAGNOSIS — R0981 Nasal congestion: Secondary | ICD-10-CM | POA: Diagnosis not present

## 2023-01-14 DIAGNOSIS — J0100 Acute maxillary sinusitis, unspecified: Secondary | ICD-10-CM | POA: Diagnosis not present

## 2023-01-14 LAB — POC COVID19 BINAXNOW: SARS Coronavirus 2 Ag: NEGATIVE

## 2023-01-14 MED ORDER — AMOXICILLIN-POT CLAVULANATE 875-125 MG PO TABS
1.0000 | ORAL_TABLET | Freq: Two times a day (BID) | ORAL | 0 refills | Status: AC
Start: 2023-01-14 — End: 2023-01-21

## 2023-01-14 NOTE — Progress Notes (Signed)
Established Patient Office Visit     CC/Reason for Visit: Severe maxillary pain  HPI: [name] is a 82 y.o. female who is coming in today for the above mentioned reasons.  For the past 5 days she has been having nasal congestion, runny nose, ear pain but most importantly severe maxillary sinus pain.  Teeth have been painful.  She has a low-grade temperature of 100.2 in office today.   Past Medical/Surgical History: Past Medical History:  Diagnosis Date   Allergy    Anxiety    Arthritis    PAIN AND OA RIGHT KNEE; PAIN AND DDD AND SPINAL STENOSIS   Complication of anesthesia    PULLED OUT MY IV'S AND FOLEY - HALLUCINATIONS WITH HIATAL HERNIA SURGERY   Depression    Diabetes mellitus without complication (HCC)    Diverticulosis    PER COLONOSCOPY   GERD (gastroesophageal reflux disease)    OVER THE COUNTER MEDS IF NEEDED   Heart murmur    "VERY SLIGHT ALL MY LIFE"   Hyperlipidemia    Hypertension    Sleep apnea    does not tolerate CPAP - HAS NOT USED IN OVER 2 YRS    Past Surgical History:  Procedure Laterality Date   BACK SURGERY     BREAST LUMPECTOMY WITH RADIOACTIVE SEED LOCALIZATION Left 09/26/2021   Procedure: LEFT BREAST LUMPECTOMY WITH RADIOACTIVE SEED LOCALIZATION;  Surgeon: Abigail Miyamoto, MD;  Location: [facility];  Service: General;  Laterality: Left;   COLONOSCOPY     EYE SURGERY     bilateral cataracts   HIATAL HERNIA REPAIR  09/03/2006   LUMBAR FUSION     LUMBAR LAMINECTOMY     PARTIAL KNEE ARTHROPLASTY Right 07/02/2014   Procedure: RIGHT MEDIAL UNICOMPARTMENTAL KNEE;  Surgeon: Shelda Pal, MD;  Location: [facility];  Service: Orthopedics;  Laterality: Right;   UPPER GASTROINTESTINAL ENDOSCOPY      Social History:  reports that she has never smoked. She has never used smokeless tobacco. She reports that she does not drink alcohol and does not use drugs.  Allergies: Allergies  Allergen Reactions   Ambien [Zolpidem Tartrate]     amnesia    Hydrochlorothiazide     Other reaction(s): Lethargy (intolerance)   Prednisone     Rapid heart rate   Thiazide-Type Diuretics Other (See Comments)    "could not physically move"   Sulfamethoxazole Rash    Family History:  Family History  Problem Relation Age of Onset   Heart disease Mother    Breast cancer Mother    Heart disease Father    Heart disease Sister    Pulmonary embolism Sister    Diabetes Paternal Grandfather      Current Outpatient Medications:    Accu-Chek FastClix Lancets MISC, 1 each by Does not apply route daily., Disp: 100 each, Rfl: 12   ALPRAZolam (XANAX) 0.25 MG tablet, Take 1 tablet (0.25 mg total) by mouth 2 (two) times daily as needed. for anxiety, Disp: 60 tablet, Rfl: 2   amLODipine (NORVASC) 5 MG tablet, TAKE ONE TABLET BY MOUTH DAILY, Disp: 90 tablet, Rfl: 1   amoxicillin-clavulanate (AUGMENTIN) 875-125 MG tablet, Take 1 tablet by mouth 2 (two) times daily for 7 days., Disp: 14 tablet, Rfl: 0   Artificial Tear Ointment (DRY EYES OP), Place 1 drop into both eyes at bedtime as needed (Dry eye)., Disp: , Rfl:    Blood Glucose Monitoring Suppl (ACCU-CHEK GUIDE ME) w/Device KIT, 1 each by  Does not apply route daily., Disp: 1 kit, Rfl: 2   COVID-19 mRNA vaccine 2023-2024 (COMIRNATY) syringe, Inject into the muscle., Disp: 0.3 mL, Rfl: 0   diclofenac Sodium (VOLTAREN) 1 % GEL, Apply 4 g topically 4 (four) times daily. Apply to bilateral knees., Disp: , Rfl:    diphenhydramine-acetaminophen (TYLENOL PM) 25-500 MG TABS tablet, Take 2 tablets by mouth at bedtime., Disp: , Rfl:    fluticasone (CUTIVATE) 0.005 % ointment, Apply 1 application topically daily., Disp: , Rfl:    glucose blood (ACCU-CHEK GUIDE) test strip, 1 each by Other route daily. Use as instructed, Disp: 100 each, Rfl: 12   HYDROcodone-acetaminophen (NORCO/VICODIN) 5-325 MG tablet, Take 1 tablet by mouth every 6 (six) hours as needed for moderate pain., Disp: 90 tablet, Rfl: 0    HYDROcodone-acetaminophen (NORCO/VICODIN) 5-325 MG tablet, Take 1 tablet by mouth 3 (three) times daily., Disp: 90 tablet, Rfl: 0   HYDROcodone-acetaminophen (NORCO/VICODIN) 5-325 MG tablet, TAKE ONE TABLET BY MOUTH THREE TIMES DAILY AS NEEDED MODERATE OR FOR SEVERE PAIN, Disp: 90 tablet, Rfl: 0   ketoconazole (NIZORAL) 2 % shampoo, Apply 1 application topically 3 (three) times a week., Disp: , Rfl:    lisinopril (ZESTRIL) 5 MG tablet, TAKE ONE TABLET BY MOUTH IN THE MORNING AND TAKE ONE TABLET AT BEDTIME, Disp: 180 tablet, Rfl: 1   meloxicam (MOBIC) 15 MG tablet, Take 1 tablet (15 mg total) by mouth daily., Disp: 90 tablet, Rfl: 0   mometasone (NASONEX) 50 MCG/ACT nasal spray, Place 2 sprays into the nose at bedtime., Disp: 17 g, Rfl: 6   oxybutynin (DITROPAN-XL) 10 MG 24 hr tablet, Take 1 tablet (10 mg total) by mouth daily., Disp: 90 tablet, Rfl: 1   raNITIdine HCl (ZANTAC PO), Take 4 mg by mouth at bedtime., Disp: , Rfl:    Sennosides (EX-LAX PO), Take 1 tablet by mouth at bedtime., Disp: , Rfl:    simvastatin (ZOCOR) 20 MG tablet, Take 1 tablet (20 mg total) by mouth at bedtime., Disp: 90 tablet, Rfl: 1   tamoxifen (NOLVADEX) 20 MG tablet, TAKE ONE TABLET BY MOUTH DAILY FIRST DOSE START ON 12-02-21, Disp: 30 tablet, Rfl: 3   triamcinolone (NASACORT) 55 MCG/ACT AERO nasal inhaler, Place 2 sprays into the nose daily., Disp: , Rfl:    triamcinolone cream (KENALOG) 0.1 %, APPLY TO RASH SPARINGLY ONCE DAILY, Disp: 30 g, Rfl: 0  Review of Systems:  Negative unless indicated in HPI.   Physical Exam: Vitals:   01/14/23 1605  BP: 124/80  Pulse: 84  Temp: 100.2 F (37.9 C)  TempSrc: Oral  SpO2: 98%  Weight: 159 lb (72.1 kg)    Body mass index is 28.17 kg/m.   Physical Exam Vitals reviewed.  Constitutional:      Appearance: Normal appearance.  HENT:     Right Ear: Tympanic membrane, ear canal and external ear normal.     Left Ear: Tympanic membrane, ear canal and external ear normal.      Mouth/Throat:     Mouth: Mucous membranes are moist.     Pharynx: Oropharynx is clear.  Eyes:     Conjunctiva/sclera: Conjunctivae normal.     Pupils: Pupils are equal, round, and reactive to light.  Cardiovascular:     Rate and Rhythm: Normal rate and regular rhythm.  Pulmonary:     Effort: Pulmonary effort is normal.     Breath sounds: Normal breath sounds.  Neurological:     Mental Status: She is alert.  Impression and Plan:  Acute non-recurrent maxillary sinusitis -     Amoxicillin-Pot Clavulanate; Take 1 tablet by mouth 2 (two) times daily for 7 days.  Dispense: 14 tablet; Refill: 0  Nasal congestion -     POC COVID-19 BinaxNow   -In office COVID test resulted negative. -With low-grade temperature, balance issues and severe maxillary sinus pain, I believe treatment with antibiotics is indicated, I will send Augmentin to take twice daily for 7 days.  Have also advised use of over-the-counter pain relievers, decongestants and guaifenesin.  Time spent:31 minutes spent today reviewing chart, interviewing and examining patient and formulating plan of care.     Chaya Jan, MD [facility]

## 2023-03-06 ENCOUNTER — Encounter: Payer: Self-pay | Admitting: Internal Medicine

## 2023-03-06 ENCOUNTER — Ambulatory Visit (INDEPENDENT_AMBULATORY_CARE_PROVIDER_SITE_OTHER): Payer: Medicare Other | Admitting: Internal Medicine

## 2023-03-06 VITALS — BP 120/80 | HR 82 | Temp 99.1°F | Ht 63.0 in | Wt 162.0 lb

## 2023-03-06 DIAGNOSIS — E1139 Type 2 diabetes mellitus with other diabetic ophthalmic complication: Secondary | ICD-10-CM

## 2023-03-06 DIAGNOSIS — G894 Chronic pain syndrome: Secondary | ICD-10-CM | POA: Diagnosis not present

## 2023-03-06 DIAGNOSIS — I10 Essential (primary) hypertension: Secondary | ICD-10-CM

## 2023-03-06 DIAGNOSIS — E785 Hyperlipidemia, unspecified: Secondary | ICD-10-CM | POA: Diagnosis not present

## 2023-03-06 DIAGNOSIS — F411 Generalized anxiety disorder: Secondary | ICD-10-CM

## 2023-03-06 LAB — POCT GLYCOSYLATED HEMOGLOBIN (HGB A1C): Hemoglobin A1C: 6.3 % — AB (ref 4.0–5.6)

## 2023-03-06 MED ORDER — AMLODIPINE BESYLATE 5 MG PO TABS
ORAL_TABLET | ORAL | 1 refills | Status: DC
Start: 2023-03-06 — End: 2023-10-14

## 2023-03-06 MED ORDER — ALPRAZOLAM 0.25 MG PO TABS
0.2500 mg | ORAL_TABLET | Freq: Two times a day (BID) | ORAL | 2 refills | Status: DC | PRN
Start: 2023-03-06 — End: 2023-08-08

## 2023-03-06 MED ORDER — HYDROCODONE-ACETAMINOPHEN 5-325 MG PO TABS
1.0000 | ORAL_TABLET | Freq: Three times a day (TID) | ORAL | 0 refills | Status: DC
Start: 2023-03-06 — End: 2023-07-03

## 2023-03-06 MED ORDER — HYDROCODONE-ACETAMINOPHEN 5-325 MG PO TABS
ORAL_TABLET | ORAL | 0 refills | Status: DC
Start: 2023-03-06 — End: 2023-07-03

## 2023-03-06 MED ORDER — LISINOPRIL 5 MG PO TABS
ORAL_TABLET | ORAL | 1 refills | Status: DC
Start: 2023-03-06 — End: 2023-10-14

## 2023-03-06 MED ORDER — SIMVASTATIN 20 MG PO TABS: 20.0000 mg | ORAL_TABLET | Freq: Every day | ORAL | 1 refills | Status: DC

## 2023-03-06 MED ORDER — HYDROCODONE-ACETAMINOPHEN 5-325 MG PO TABS
1.0000 | ORAL_TABLET | Freq: Four times a day (QID) | ORAL | 0 refills | Status: DC | PRN
Start: 2023-03-06 — End: 2023-07-03

## 2023-03-06 NOTE — Assessment & Plan Note (Signed)
Well-controlled with an A1c of 6.3.

## 2023-03-06 NOTE — Assessment & Plan Note (Signed)
Blood pressure is well-controlled on lisinopril and amlodipine.

## 2023-03-06 NOTE — Assessment & Plan Note (Signed)
Well-controlled with a recent LDL of 58 on simvastatin 20 mg daily.

## 2023-03-06 NOTE — Patient Instructions (Signed)
Health Maintenance Due  Topic Date Due   FOOT EXAM  04/03/2014   Zoster Vaccines- Shingrix (2 of 2) 03/19/2022   COVID-19 Vaccine (7 - 2023-24 season) 10/04/2022   Diabetic kidney evaluation - Urine ACR  02/28/2023      Row Labels 01/14/2023    4:17 PM 12/05/2022    1:33 PM 09/06/2022   10:28 AM  Depression screen PHQ 2/9   Section Header. No data exists in this row.     Decreased Interest   0 0 1  Down, Depressed, Hopeless   0 0 1  PHQ - 2 Score   0 0 2  Altered sleeping   0 0 0  Tired, decreased energy   0 0 1  Change in appetite   0 0 0  Feeling bad or failure about yourself    0 0 0  Trouble concentrating   0 0 1  Moving slowly or fidgety/restless   0 0 0  Suicidal thoughts   0 0 0  PHQ-9 Score   0 0 4  Difficult doing work/chores   Not difficult at all Not difficult at all

## 2023-03-06 NOTE — Assessment & Plan Note (Signed)
Alprazolam refilled today.

## 2023-03-06 NOTE — Progress Notes (Signed)
Established Patient Office Visit     CC/Reason for Visit: Follow-up chronic conditions, medication refills  HPI: [name] is a 82 y.o. female who is coming in today for the above mentioned reasons. Past Medical History is significant for: Hypertension, hyperlipidemia, type 2 diabetes, breast cancer, anxiety, OSA, chronic pain syndrome on hydrocodone.  She is feeling well.  Is needing medication refills.   Past Medical/Surgical History: Past Medical History:  Diagnosis Date   Allergy    Anxiety    Arthritis    PAIN AND OA RIGHT KNEE; PAIN AND DDD AND SPINAL STENOSIS   Complication of anesthesia    PULLED OUT MY IV'S AND FOLEY - HALLUCINATIONS WITH HIATAL HERNIA SURGERY   Depression    Diabetes mellitus without complication (HCC)    Diverticulosis    PER COLONOSCOPY   GERD (gastroesophageal reflux disease)    OVER THE COUNTER MEDS IF NEEDED   Heart murmur    "VERY SLIGHT ALL MY LIFE"   Hyperlipidemia    Hypertension    Sleep apnea    does not tolerate CPAP - HAS NOT USED IN OVER 2 YRS    Past Surgical History:  Procedure Laterality Date   BACK SURGERY     BREAST LUMPECTOMY WITH RADIOACTIVE SEED LOCALIZATION Left 09/26/2021   Procedure: LEFT BREAST LUMPECTOMY WITH RADIOACTIVE SEED LOCALIZATION;  Surgeon: Abigail Miyamoto, MD;  Location: [facility];  Service: General;  Laterality: Left;   COLONOSCOPY     EYE SURGERY     bilateral cataracts   HIATAL HERNIA REPAIR  09/03/2006   LUMBAR FUSION     LUMBAR LAMINECTOMY     PARTIAL KNEE ARTHROPLASTY Right 07/02/2014   Procedure: RIGHT MEDIAL UNICOMPARTMENTAL KNEE;  Surgeon: Shelda Pal, MD;  Location: [facility];  Service: Orthopedics;  Laterality: Right;   UPPER GASTROINTESTINAL ENDOSCOPY      Social History:  reports that she has never smoked. She has never used smokeless tobacco. She reports that she does not drink alcohol and does not use drugs.  Allergies: Allergies  Allergen Reactions   Ambien [Zolpidem  Tartrate]     amnesia   Hydrochlorothiazide     Other reaction(s): Lethargy (intolerance)   Prednisone     Rapid heart rate   Thiazide-Type Diuretics Other (See Comments)    "could not physically move"   Sulfamethoxazole Rash    Family History:  Family History  Problem Relation Age of Onset   Heart disease Mother    Breast cancer Mother    Heart disease Father    Heart disease Sister    Pulmonary embolism Sister    Diabetes Paternal Grandfather      Current Outpatient Medications:    Accu-Chek FastClix Lancets MISC, 1 each by Does not apply route daily., Disp: 100 each, Rfl: 12   Artificial Tear Ointment (DRY EYES OP), Place 1 drop into both eyes at bedtime as needed (Dry eye)., Disp: , Rfl:    Blood Glucose Monitoring Suppl (ACCU-CHEK GUIDE ME) w/Device KIT, 1 each by Does not apply route daily., Disp: 1 kit, Rfl: 2   COVID-19 mRNA vaccine 2023-2024 (COMIRNATY) syringe, Inject into the muscle., Disp: 0.3 mL, Rfl: 0   diclofenac Sodium (VOLTAREN) 1 % GEL, Apply 4 g topically 4 (four) times daily. Apply to bilateral knees., Disp: , Rfl:    diphenhydramine-acetaminophen (TYLENOL PM) 25-500 MG TABS tablet, Take 2 tablets by mouth at bedtime., Disp: , Rfl:    fluticasone (CUTIVATE) 0.005 %  ointment, Apply 1 application topically daily., Disp: , Rfl:    glucose blood (ACCU-CHEK GUIDE) test strip, 1 each by Other route daily. Use as instructed, Disp: 100 each, Rfl: 12   ketoconazole (NIZORAL) 2 % shampoo, Apply 1 application topically 3 (three) times a week., Disp: , Rfl:    meloxicam (MOBIC) 15 MG tablet, Take 1 tablet (15 mg total) by mouth daily., Disp: 90 tablet, Rfl: 0   mometasone (NASONEX) 50 MCG/ACT nasal spray, Place 2 sprays into the nose at bedtime., Disp: 17 g, Rfl: 6   oxybutynin (DITROPAN-XL) 10 MG 24 hr tablet, Take 1 tablet (10 mg total) by mouth daily., Disp: 90 tablet, Rfl: 1   raNITIdine HCl (ZANTAC PO), Take 4 mg by mouth at bedtime., Disp: , Rfl:    Sennosides  (EX-LAX PO), Take 1 tablet by mouth at bedtime., Disp: , Rfl:    tamoxifen (NOLVADEX) 20 MG tablet, TAKE ONE TABLET BY MOUTH DAILY FIRST DOSE START ON 12-02-21, Disp: 30 tablet, Rfl: 3   triamcinolone (NASACORT) 55 MCG/ACT AERO nasal inhaler, Place 2 sprays into the nose daily., Disp: , Rfl:    triamcinolone cream (KENALOG) 0.1 %, APPLY TO RASH SPARINGLY ONCE DAILY, Disp: 30 g, Rfl: 0   ALPRAZolam (XANAX) 0.25 MG tablet, Take 1 tablet (0.25 mg total) by mouth 2 (two) times daily as needed. for anxiety, Disp: 60 tablet, Rfl: 2   amLODipine (NORVASC) 5 MG tablet, TAKE ONE TABLET BY MOUTH DAILY, Disp: 90 tablet, Rfl: 1   HYDROcodone-acetaminophen (NORCO/VICODIN) 5-325 MG tablet, Take 1 tablet by mouth every 6 (six) hours as needed for moderate pain., Disp: 90 tablet, Rfl: 0   HYDROcodone-acetaminophen (NORCO/VICODIN) 5-325 MG tablet, Take 1 tablet by mouth 3 (three) times daily., Disp: 90 tablet, Rfl: 0   HYDROcodone-acetaminophen (NORCO/VICODIN) 5-325 MG tablet, TAKE ONE TABLET BY MOUTH THREE TIMES DAILY AS NEEDED MODERATE OR FOR SEVERE PAIN, Disp: 90 tablet, Rfl: 0   lisinopril (ZESTRIL) 5 MG tablet, TAKE ONE TABLET BY MOUTH IN THE MORNING AND TAKE ONE TABLET AT BEDTIME, Disp: 180 tablet, Rfl: 1   simvastatin (ZOCOR) 20 MG tablet, Take 1 tablet (20 mg total) by mouth at bedtime., Disp: 90 tablet, Rfl: 1  Review of Systems:  Negative unless indicated in HPI.   Physical Exam: Vitals:   03/06/23 1348  BP: 120/80  Pulse: 82  Temp: 99.1 F (37.3 C)  TempSrc: Oral  SpO2: 98%  Weight: 162 lb (73.5 kg)  Height: 5\' 3"  (1.6 m)    Body mass index is 28.7 kg/m.   Physical Exam Vitals reviewed.  Constitutional:      Appearance: Normal appearance.  HENT:     Head: Normocephalic and atraumatic.  Eyes:     Conjunctiva/sclera: Conjunctivae normal.     Pupils: Pupils are equal, round, and reactive to light.  Cardiovascular:     Rate and Rhythm: Normal rate and regular rhythm.  Pulmonary:      Effort: Pulmonary effort is normal.     Breath sounds: Normal breath sounds.  Skin:    General: Skin is warm and dry.  Neurological:     General: No focal deficit present.     Mental Status: She is alert and oriented to person, place, and time.  Psychiatric:        Mood and Affect: Mood normal.        Behavior: Behavior normal.        Thought Content: Thought content normal.  Judgment: Judgment normal.      Impression and Plan:  Type 2 diabetes mellitus with other ophthalmic complication, without long-term current use of insulin (HCC) Assessment & Plan: Well-controlled with an A1c of 6.3.  Orders: -     POCT glycosylated hemoglobin (Hb A1C) -     Microalbumin / creatinine urine ratio  Chronic pain syndrome Assessment & Plan: -PDMP reviewed, no red flags, overdose risk score is 80. -Refill hydrocodone 5/325 mg for total of 90 tablets a month x 3 months.  Orders: -     HYDROcodone-Acetaminophen; Take 1 tablet by mouth every 6 (six) hours as needed for moderate pain.  Dispense: 90 tablet; Refill: 0 -     HYDROcodone-Acetaminophen; Take 1 tablet by mouth 3 (three) times daily.  Dispense: 90 tablet; Refill: 0 -     HYDROcodone-Acetaminophen; TAKE ONE TABLET BY MOUTH THREE TIMES DAILY AS NEEDED MODERATE OR FOR SEVERE PAIN  Dispense: 90 tablet; Refill: 0  Hyperlipidemia, unspecified hyperlipidemia type Assessment & Plan: Well-controlled with a recent LDL of 58 on simvastatin 20 mg daily.  Orders: -     Simvastatin; Take 1 tablet (20 mg total) by mouth at bedtime.  Dispense: 90 tablet; Refill: 1  Essential hypertension Assessment & Plan: Blood pressure is well-controlled on lisinopril and amlodipine.  Orders: -     Lisinopril; TAKE ONE TABLET BY MOUTH IN THE MORNING AND TAKE ONE TABLET AT BEDTIME  Dispense: 180 tablet; Refill: 1 -     amLODIPine Besylate; TAKE ONE TABLET BY MOUTH DAILY  Dispense: 90 tablet; Refill: 1  Generalized anxiety disorder Assessment &  Plan: Alprazolam refilled today.  Orders: -     ALPRAZolam; Take 1 tablet (0.25 mg total) by mouth 2 (two) times daily as needed. for anxiety  Dispense: 60 tablet; Refill: 2     Time spent:33 minutes reviewing chart, interviewing and examining patient and formulating plan of care.     Chaya Jan, MD [facility]

## 2023-03-06 NOTE — Assessment & Plan Note (Signed)
-  PDMP reviewed, no red flags, overdose risk score is 80. -Refill hydrocodone 5/325 mg for total of 90 tablets a month x 3 months.

## 2023-03-25 DIAGNOSIS — Z96651 Presence of right artificial knee joint: Secondary | ICD-10-CM | POA: Diagnosis not present

## 2023-03-25 DIAGNOSIS — M170 Bilateral primary osteoarthritis of knee: Secondary | ICD-10-CM | POA: Diagnosis not present

## 2023-04-20 ENCOUNTER — Other Ambulatory Visit: Payer: Self-pay | Admitting: Hematology

## 2023-04-20 DIAGNOSIS — Z170 Estrogen receptor positive status [ER+]: Secondary | ICD-10-CM

## 2023-05-08 DIAGNOSIS — M170 Bilateral primary osteoarthritis of knee: Secondary | ICD-10-CM | POA: Diagnosis not present

## 2023-05-15 ENCOUNTER — Other Ambulatory Visit: Payer: Self-pay

## 2023-05-15 DIAGNOSIS — C50919 Malignant neoplasm of unspecified site of unspecified female breast: Secondary | ICD-10-CM

## 2023-05-15 DIAGNOSIS — M170 Bilateral primary osteoarthritis of knee: Secondary | ICD-10-CM | POA: Diagnosis not present

## 2023-05-15 DIAGNOSIS — C50412 Malignant neoplasm of upper-outer quadrant of left female breast: Secondary | ICD-10-CM

## 2023-05-16 ENCOUNTER — Inpatient Hospital Stay: Payer: Medicare HMO | Admitting: Hematology

## 2023-05-16 ENCOUNTER — Inpatient Hospital Stay: Payer: Medicare HMO | Attending: Hematology

## 2023-05-16 DIAGNOSIS — F419 Anxiety disorder, unspecified: Secondary | ICD-10-CM | POA: Insufficient documentation

## 2023-05-16 DIAGNOSIS — Z170 Estrogen receptor positive status [ER+]: Secondary | ICD-10-CM | POA: Insufficient documentation

## 2023-05-16 DIAGNOSIS — C50412 Malignant neoplasm of upper-outer quadrant of left female breast: Secondary | ICD-10-CM | POA: Insufficient documentation

## 2023-05-16 DIAGNOSIS — E785 Hyperlipidemia, unspecified: Secondary | ICD-10-CM | POA: Insufficient documentation

## 2023-05-16 DIAGNOSIS — G894 Chronic pain syndrome: Secondary | ICD-10-CM | POA: Insufficient documentation

## 2023-05-16 DIAGNOSIS — I10 Essential (primary) hypertension: Secondary | ICD-10-CM | POA: Insufficient documentation

## 2023-05-16 DIAGNOSIS — Z79899 Other long term (current) drug therapy: Secondary | ICD-10-CM | POA: Insufficient documentation

## 2023-05-16 DIAGNOSIS — Z8616 Personal history of COVID-19: Secondary | ICD-10-CM | POA: Insufficient documentation

## 2023-05-16 NOTE — Assessment & Plan Note (Deleted)
invasive lobular carcinoma, stage IA, pT1b, cN0, ER+/PR+/HER2-, Grade 2  -found on screening mammogram. S/p left lumpectomy on 09/26/21 by Dr. Magnus Ivan showed: 1 cm invasive ductal carcinoma; low grade DCIS; ALH. Margins negative. -she received ultra-hypofractionated radiation therapy under Dr. Basilio Cairo 2/27-11/03/21. -she started tamoxifen in late 11/2021, plan for 5 years. She is tolerating well with only mild hot flashes. -she is clinically doing very well.  No clinical concern for recurrence.

## 2023-05-17 ENCOUNTER — Telehealth: Payer: Self-pay | Admitting: Hematology

## 2023-05-22 ENCOUNTER — Other Ambulatory Visit: Payer: Medicare HMO

## 2023-05-22 ENCOUNTER — Ambulatory Visit: Payer: Medicare HMO | Admitting: Hematology

## 2023-05-22 DIAGNOSIS — M170 Bilateral primary osteoarthritis of knee: Secondary | ICD-10-CM | POA: Diagnosis not present

## 2023-05-29 ENCOUNTER — Encounter: Payer: Self-pay | Admitting: Nurse Practitioner

## 2023-05-29 ENCOUNTER — Inpatient Hospital Stay: Payer: Medicare HMO

## 2023-05-29 ENCOUNTER — Inpatient Hospital Stay: Payer: Medicare HMO | Admitting: Nurse Practitioner

## 2023-05-29 DIAGNOSIS — Z79899 Other long term (current) drug therapy: Secondary | ICD-10-CM | POA: Diagnosis not present

## 2023-05-29 DIAGNOSIS — E785 Hyperlipidemia, unspecified: Secondary | ICD-10-CM | POA: Diagnosis not present

## 2023-05-29 DIAGNOSIS — C50412 Malignant neoplasm of upper-outer quadrant of left female breast: Secondary | ICD-10-CM

## 2023-05-29 DIAGNOSIS — Z170 Estrogen receptor positive status [ER+]: Secondary | ICD-10-CM

## 2023-05-29 DIAGNOSIS — Z8616 Personal history of COVID-19: Secondary | ICD-10-CM | POA: Diagnosis not present

## 2023-05-29 DIAGNOSIS — F419 Anxiety disorder, unspecified: Secondary | ICD-10-CM | POA: Diagnosis not present

## 2023-05-29 DIAGNOSIS — G894 Chronic pain syndrome: Secondary | ICD-10-CM | POA: Diagnosis not present

## 2023-05-29 DIAGNOSIS — C50919 Malignant neoplasm of unspecified site of unspecified female breast: Secondary | ICD-10-CM

## 2023-05-29 DIAGNOSIS — I10 Essential (primary) hypertension: Secondary | ICD-10-CM | POA: Diagnosis not present

## 2023-05-29 LAB — CMP (CANCER CENTER ONLY)
ALT: 8 U/L (ref 0–44)
AST: 13 U/L — ABNORMAL LOW (ref 15–41)
Albumin: 4 g/dL (ref 3.5–5.0)
Alkaline Phosphatase: 28 U/L — ABNORMAL LOW (ref 38–126)
Anion gap: 6 (ref 5–15)
BUN: 16 mg/dL (ref 8–23)
CO2: 27 mmol/L (ref 22–32)
Calcium: 9.1 mg/dL (ref 8.9–10.3)
Chloride: 107 mmol/L (ref 98–111)
Creatinine: 0.82 mg/dL (ref 0.44–1.00)
GFR, Estimated: >60 mL/min (ref >60)
Glucose, Bld: 116 mg/dL — ABNORMAL HIGH (ref 70–99)
Potassium: 4.1 mmol/L (ref 3.5–5.1)
Sodium: 140 mmol/L (ref 135–145)
Total Bilirubin: 0.6 mg/dL (ref 0.3–1.2)
Total Protein: 6.7 g/dL (ref 6.5–8.1)

## 2023-05-29 LAB — CBC WITH DIFFERENTIAL (CANCER CENTER ONLY)
Abs Immature Granulocytes: 0.03 10*3/uL (ref 0.00–0.07)
Basophils Absolute: 0.1 10*3/uL (ref 0.0–0.1)
Basophils Relative: 1 %
Eosinophils Absolute: 0.2 10*3/uL (ref 0.0–0.5)
Eosinophils Relative: 3 %
HCT: 37.9 % (ref 36.0–46.0)
Hemoglobin: 13 g/dL (ref 12.0–15.0)
Immature Granulocytes: 1 %
Lymphocytes Relative: 33 %
Lymphs Abs: 1.9 10*3/uL (ref 0.7–4.0)
MCH: 33.6 pg (ref 26.0–34.0)
MCHC: 34.3 g/dL (ref 30.0–36.0)
MCV: 97.9 fL (ref 80.0–100.0)
Monocytes Absolute: 0.4 10*3/uL (ref 0.1–1.0)
Monocytes Relative: 6 %
Neutro Abs: 3.3 10*3/uL (ref 1.7–7.7)
Neutrophils Relative %: 56 %
Platelet Count: 216 10*3/uL (ref 150–400)
RBC: 3.87 MIL/uL (ref 3.87–5.11)
RDW: 12.4 % (ref 11.5–15.5)
WBC Count: 5.9 10*3/uL (ref 4.0–10.5)
nRBC: 0 % (ref 0.0–0.2)

## 2023-05-29 MED ORDER — TAMOXIFEN CITRATE 20 MG PO TABS
ORAL_TABLET | ORAL | 3 refills | Status: DC
Start: 2023-05-29 — End: 2024-06-01

## 2023-05-29 NOTE — Progress Notes (Signed)
Patient Care Team: Philip Aspen, Limmie Patricia, MD as PCP - General (Internal Medicine) Abigail Miyamoto, MD as Consulting Physician (General Surgery) Malachy Mood, MD as Consulting Physician (Hematology) Lonie Peak, MD as Attending Physician (Radiation Oncology) Jethro Bolus, MD as Consulting Physician (Ophthalmology) Donnelly Angelica, RN as Oncology Nurse Navigator Pershing Proud, RN as Oncology Nurse Navigator   CHIEF COMPLAINT: Follow up left breast cancer   Oncology History Overview Note   Cancer Staging  Malignant neoplasm of upper-outer quadrant of left breast in female, estrogen receptor positive [facility]) Staging form: Breast, AJCC 8th Edition - Clinical stage from 08/17/2021: Stage IA (cT1c, cN0, cM0, G2, ER+, PR+, HER2-) - Signed by Malachy Mood, MD on 09/18/2021    Malignant neoplasm of upper-outer quadrant of left breast in female, estrogen receptor positive (HCC)  08/09/2021 Mammogram   EXAM: DIGITAL DIAGNOSTIC UNILATERAL LEFT MAMMOGRAM WITH TOMOSYNTHESIS AND CAD; ULTRASOUND LEFT BREAST LIMITED  IMPRESSION: 1.  There is a suspicious mass in the left breast at 1 o'clock.   2.  No evidence of left axillary lymphadenopathy.   08/17/2021 Cancer Staging   Staging form: Breast, AJCC 8th Edition - Clinical stage from 08/17/2021: Stage IA (cT1c, cN0, cM0, G2, ER+, PR+, HER2-) - Signed by Malachy Mood, MD on 09/18/2021 Stage prefix: Initial diagnosis Histologic grading system: 3 grade system   08/17/2021 Initial Biopsy   Diagnosis Breast, left, needle core biopsy, left breast 1:00, same - INVASIVE MAMMARY CARCINOMA, GRADE 1/2. - SEE MICROSCOPIC DESCRIPTION. Microscopic Comment The greatest tumor dimension is 1.1 cm .  Addendum: Immunohistochemistry for E-cadherin is negative consistent with lobular carcinoma.  PROGNOSTIC INDICATORS Results: The tumor cells are EQUIVOCAL for Her2 (2+). Her2 by FISH will be performed and results reported separately. Estrogen Receptor:  95%, POSITIVE, STRONG STAINING INTENSITY Progesterone Receptor: 100%, POSITIVE, STRONG STAINING INTENSITY Proliferation Marker Ki67: 5%  FLUORESCENCE IN-SITU HYBRIDIZATION Results: GROUP 5: HER2 **NEGATIVE**   09/18/2021 Initial Diagnosis   Malignant neoplasm of upper-outer quadrant of left breast in female, estrogen receptor positive (HCC)   09/26/2021 Cancer Staging   Staging form: Breast, AJCC 8th Edition - Pathologic stage from 09/26/2021: Stage IA (pT1b, pN0, cM0, G2, ER+, PR+, HER2-) - Signed by Malachy Mood, MD on 11/10/2021 Stage prefix: Initial diagnosis Histologic grading system: 3 grade system Residual tumor (R): R0 - None   09/26/2021 Definitive Surgery   FINAL MICROSCOPIC DIAGNOSIS:   A. BREAST, LEFT, LUMPECTOMY:  - Invasive ductal carcinoma, 1 cm, grade 2  - Ductal carcinoma in situ, low-grade  - Resection margins are negative for carcinoma - closest is the superior margin at 0.3 cm  - Atypical lobular hyperplasia  - Biopsy site changes  - See oncology table    10/30/2021 - 11/03/2021 Radiation Therapy        CURRENT THERAPY: Tamoxifen starting 12/2021  INTERVAL HISTORY Ms. Haseley returns for follow up as scheduled. Last seen by my colleague 11/15/22. She continues tamoxifen, tolerating well overall. Getting knee injections. Denies hot flashes, breast concerns or changes, or any other new/specific complaints. Mammogram due 07/2023.  ROS  All other systems reviewed and negative   Past Medical History:  Diagnosis Date   Allergy    Anxiety    Arthritis    PAIN AND OA RIGHT KNEE; PAIN AND DDD AND SPINAL STENOSIS   Complication of anesthesia    PULLED OUT MY IV'S AND FOLEY - HALLUCINATIONS WITH HIATAL HERNIA SURGERY   Depression    Diabetes mellitus without complication (HCC)  Diverticulosis    PER COLONOSCOPY   GERD (gastroesophageal reflux disease)    OVER THE COUNTER MEDS IF NEEDED   Heart murmur    "VERY SLIGHT ALL MY LIFE"   Hyperlipidemia     Hypertension    Sleep apnea    does not tolerate CPAP - HAS NOT USED IN OVER 2 YRS     Past Surgical History:  Procedure Laterality Date   BACK SURGERY     BREAST LUMPECTOMY WITH RADIOACTIVE SEED LOCALIZATION Left 09/26/2021   Procedure: LEFT BREAST LUMPECTOMY WITH RADIOACTIVE SEED LOCALIZATION;  Surgeon: Abigail Miyamoto, MD;  Location: [facility] OR;  Service: General;  Laterality: Left;   COLONOSCOPY     EYE SURGERY     bilateral cataracts   HIATAL HERNIA REPAIR  09/03/2006   LUMBAR FUSION     LUMBAR LAMINECTOMY     PARTIAL KNEE ARTHROPLASTY Right 07/02/2014   Procedure: RIGHT MEDIAL UNICOMPARTMENTAL KNEE;  Surgeon: Shelda Pal, MD;  Location: [facility];  Service: Orthopedics;  Laterality: Right;   UPPER GASTROINTESTINAL ENDOSCOPY       Outpatient Encounter Medications as of 05/29/2023  Medication Sig   Accu-Chek FastClix Lancets MISC 1 each by Does not apply route daily.   ALPRAZolam (XANAX) 0.25 MG tablet Take 1 tablet (0.25 mg total) by mouth 2 (two) times daily as needed. for anxiety   amLODipine (NORVASC) 5 MG tablet TAKE ONE TABLET BY MOUTH DAILY   Artificial Tear Ointment (DRY EYES OP) Place 1 drop into both eyes at bedtime as needed (Dry eye).   Blood Glucose Monitoring Suppl (ACCU-CHEK GUIDE ME) w/Device KIT 1 each by Does not apply route daily.   COVID-19 mRNA vaccine 2023-2024 (COMIRNATY) syringe Inject into the muscle.   diclofenac Sodium (VOLTAREN) 1 % GEL Apply 4 g topically 4 (four) times daily. Apply to bilateral knees.   diphenhydramine-acetaminophen (TYLENOL PM) 25-500 MG TABS tablet Take 2 tablets by mouth at bedtime.   fluticasone (CUTIVATE) 0.005 % ointment Apply 1 application topically daily.   glucose blood (ACCU-CHEK GUIDE) test strip 1 each by Other route daily. Use as instructed   HYDROcodone-acetaminophen (NORCO/VICODIN) 5-325 MG tablet Take 1 tablet by mouth every 6 (six) hours as needed for moderate pain.   HYDROcodone-acetaminophen (NORCO/VICODIN) 5-325 MG  tablet Take 1 tablet by mouth 3 (three) times daily.   HYDROcodone-acetaminophen (NORCO/VICODIN) 5-325 MG tablet TAKE ONE TABLET BY MOUTH THREE TIMES DAILY AS NEEDED MODERATE OR FOR SEVERE PAIN   ketoconazole (NIZORAL) 2 % shampoo Apply 1 application topically 3 (three) times a week.   lisinopril (ZESTRIL) 5 MG tablet TAKE ONE TABLET BY MOUTH IN THE MORNING AND TAKE ONE TABLET AT BEDTIME   meloxicam (MOBIC) 15 MG tablet Take 1 tablet (15 mg total) by mouth daily.   mometasone (NASONEX) 50 MCG/ACT nasal spray Place 2 sprays into the nose at bedtime.   oxybutynin (DITROPAN-XL) 10 MG 24 hr tablet Take 1 tablet (10 mg total) by mouth daily.   raNITIdine HCl (ZANTAC PO) Take 4 mg by mouth at bedtime.   Sennosides (EX-LAX PO) Take 1 tablet by mouth at bedtime.   simvastatin (ZOCOR) 20 MG tablet Take 1 tablet (20 mg total) by mouth at bedtime.   triamcinolone (NASACORT) 55 MCG/ACT AERO nasal inhaler Place 2 sprays into the nose daily.   triamcinolone cream (KENALOG) 0.1 % APPLY TO RASH SPARINGLY ONCE DAILY   [DISCONTINUED] tamoxifen (NOLVADEX) 20 MG tablet TAKE ONE TABLET BY MOUTH DAILY   tamoxifen (NOLVADEX) 20  MG tablet TAKE ONE TABLET BY MOUTH DAILY   No facility-administered encounter medications on file as of 05/29/2023.     Today's Vitals   05/29/23 1056 05/29/23 1100  BP: 134/77   Pulse: 72   Resp: 16   Temp: 98.4 F (36.9 C)   TempSrc: Oral   SpO2: 100%   Weight: 170 lb (77.1 kg)   PainSc:  0-No pain   Body mass index is 30.11 kg/m.   PHYSICAL EXAM GENERAL:alert, no distress and comfortable SKIN: no rash  EYES: sclera clear NECK: without mass LYMPH:  no palpable cervical or supraclavicular lymphadenopathy  LUNGS: normal breathing effort HEART: no lower extremity edema ABDOMEN: abdomen soft, non-tender and normal bowel sounds NEURO: alert & oriented x 3 with fluent speech, no focal motor deficits Breast exam: no nipple discharge or inversion. S/p left lumpectomy, incision  completely healed. Dense tissue more on RUO quadrant, resolves with displacement of R breast. No palpable mass or nodularity in either breast or axilla that I could appreciate   CBC    Component Value Date/Time   WBC 5.9 05/29/2023 1038   WBC 6.6 09/06/2022 1027   RBC 3.87 05/29/2023 1038   HGB 13.0 05/29/2023 1038   HCT 37.9 05/29/2023 1038   PLT 216 05/29/2023 1038   MCV 97.9 05/29/2023 1038   MCH 33.6 05/29/2023 1038   MCHC 34.3 05/29/2023 1038   RDW 12.4 05/29/2023 1038   LYMPHSABS 1.9 05/29/2023 1038   MONOABS 0.4 05/29/2023 1038   EOSABS 0.2 05/29/2023 1038   BASOSABS 0.1 05/29/2023 1038     CMP     Component Value Date/Time   NA 140 05/29/2023 1038   K 4.1 05/29/2023 1038   CL 107 05/29/2023 1038   CO2 27 05/29/2023 1038   GLUCOSE 116 (H) 05/29/2023 1038   BUN 16 05/29/2023 1038   CREATININE 0.82 05/29/2023 1038   CALCIUM 9.1 05/29/2023 1038   PROT 6.7 05/29/2023 1038   ALBUMIN 4.0 05/29/2023 1038   AST 13 (L) 05/29/2023 1038   ALT 8 05/29/2023 1038   ALKPHOS 28 (L) 05/29/2023 1038   BILITOT 0.6 05/29/2023 1038   GFRNONAA >60 05/29/2023 1038   GFRAA >90 07/03/2014 0400     ASSESSMENT & PLAN: 82 yo female   1. Malignant neoplasm of upper-outer quadrant of left breast, invasive lobular carcinoma, stage IA, pT1b, cN0, ER+/PR+/HER2-, Grade 2  -found on screening mammogram. S/p left lumpectomy on 09/26/21 by Dr. Magnus Ivan showed: 1 cm invasive ductal carcinoma; low grade DCIS; ALH. Margins negative. -s/p ultra-hypofractionated radiation therapy under Dr. Basilio Cairo 2/27-11/03/21. -she started tamoxifen in late 11/2021, plan for 5 years.  -Ms. Kurtzer is clinically doing well.  Tolerating tamoxifen without significant side effects.  Exam is benign, labs are unremarkable.  Overall no clinical concern for recurrence.   -Continue breast cancer surveillance and tamoxifen, refilled -Mammogram due 07/2023, order placed today -Follow-up in 6 months, or sooner if needed  2.  Bone Health  -Her most recent DEXA was 09/10/18 showing osteoporosis (T-score -4.0) at distal radius -we previously discussed Zometa, Prolia was too expensive.   3. Chronic Pain Syndrome -managed by her PCP, Dr. Philip Aspen -she takes hydrocodone 5/325 mg q8hr     PLAN: -Labs reviewed -Continue breast cancer surveillance and Tamoxifen, refilled -Mammo due 07/2023, ordered today -F/up in 6 months, or sooner if needed   Orders Placed This Encounter  Procedures   MM DIAG BREAST TOMO BILATERAL    Standing Status:  Future    Standing Expiration Date:   05/28/2024    Order Specific Question:   Reason for Exam (SYMPTOM  OR DIAGNOSIS REQUIRED)    Answer:   h/o left breast cancer 2023    Order Specific Question:   Preferred imaging location?    Answer:   [facility]      All questions were answered. The patient knows to call the clinic with any problems, questions or concerns. No barriers to learning were detected.   Santiago Glad, NP-C 05/29/2023

## 2023-05-30 ENCOUNTER — Telehealth: Payer: Self-pay | Admitting: Nurse Practitioner

## 2023-06-05 ENCOUNTER — Ambulatory Visit: Payer: Medicare HMO | Admitting: Internal Medicine

## 2023-06-11 ENCOUNTER — Telehealth: Payer: Self-pay | Admitting: Internal Medicine

## 2023-06-11 NOTE — Telephone Encounter (Signed)
Requesting verbal confirmation of diagnosis cardiovascular disease and/or diabetes.  Reference #[id number]

## 2023-06-18 NOTE — Telephone Encounter (Signed)
I spoke with MJ at [facility] and she requested clarification on if the patient has a diagnosis of cardiovascular disease or diabetes.

## 2023-06-20 NOTE — Telephone Encounter (Signed)
I spoke with MJ with [employer] and informed her of the message below. MJ stated the reasoning for this is to update the members account with correct diagnosis and make sure the members account is not closed with [employer].

## 2023-06-24 IMAGING — MG MM BREAST LOCALIZATION CLIP
4 series · 4 of 12 positions shown · non-contrast
Comparison: Previous exam(s).

CLINICAL DATA: Evaluate biopsy marker

EXAM:
3D DIAGNOSTIC LEFT MAMMOGRAM POST ULTRASOUND BIOPSY

[L ML synth-2D]
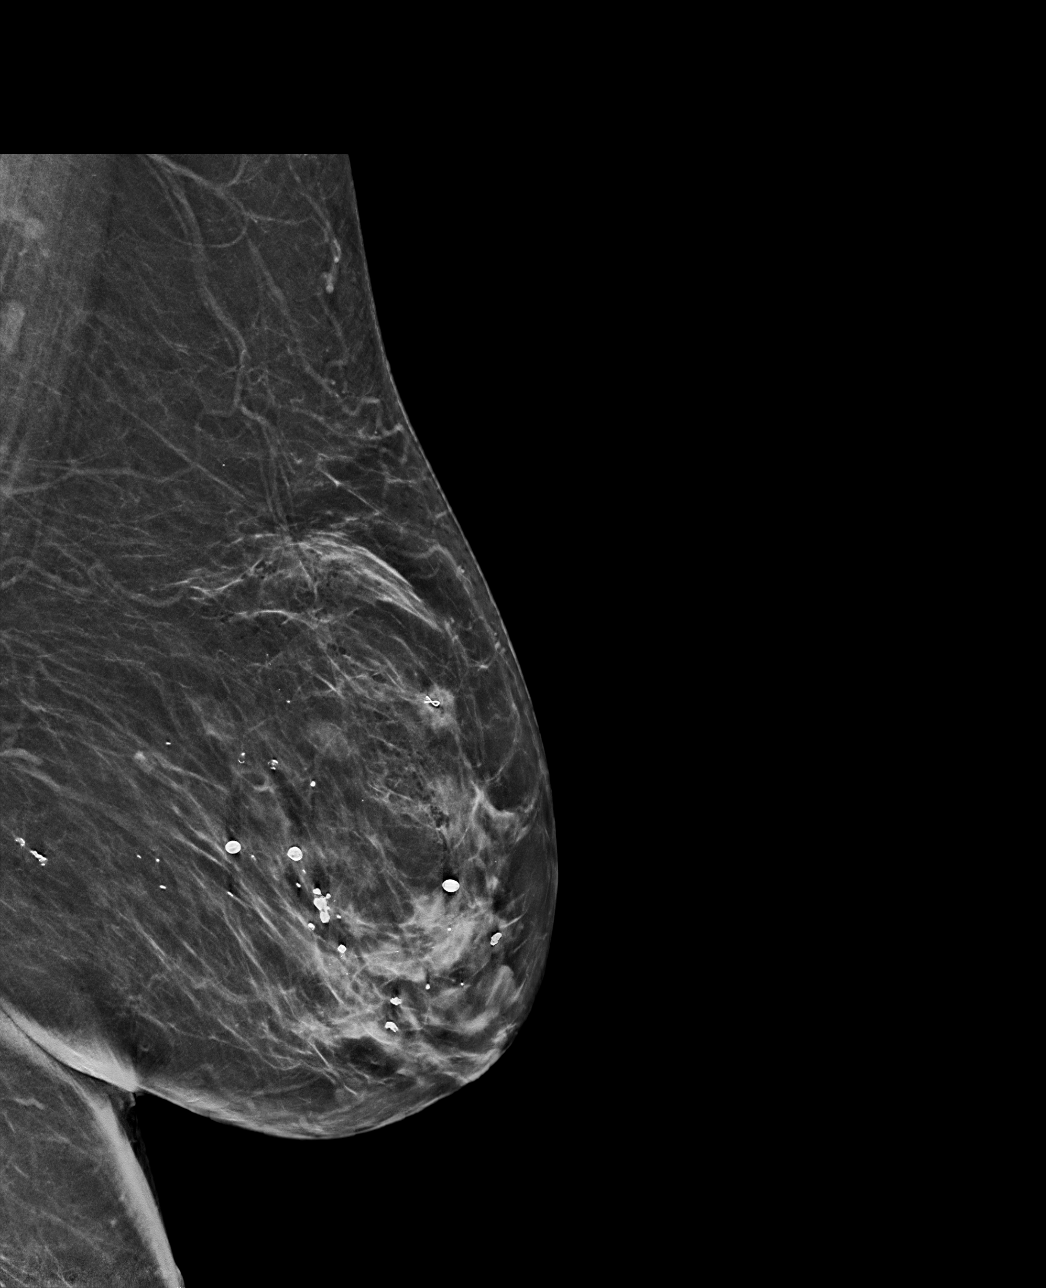

[L CC synth-2D]
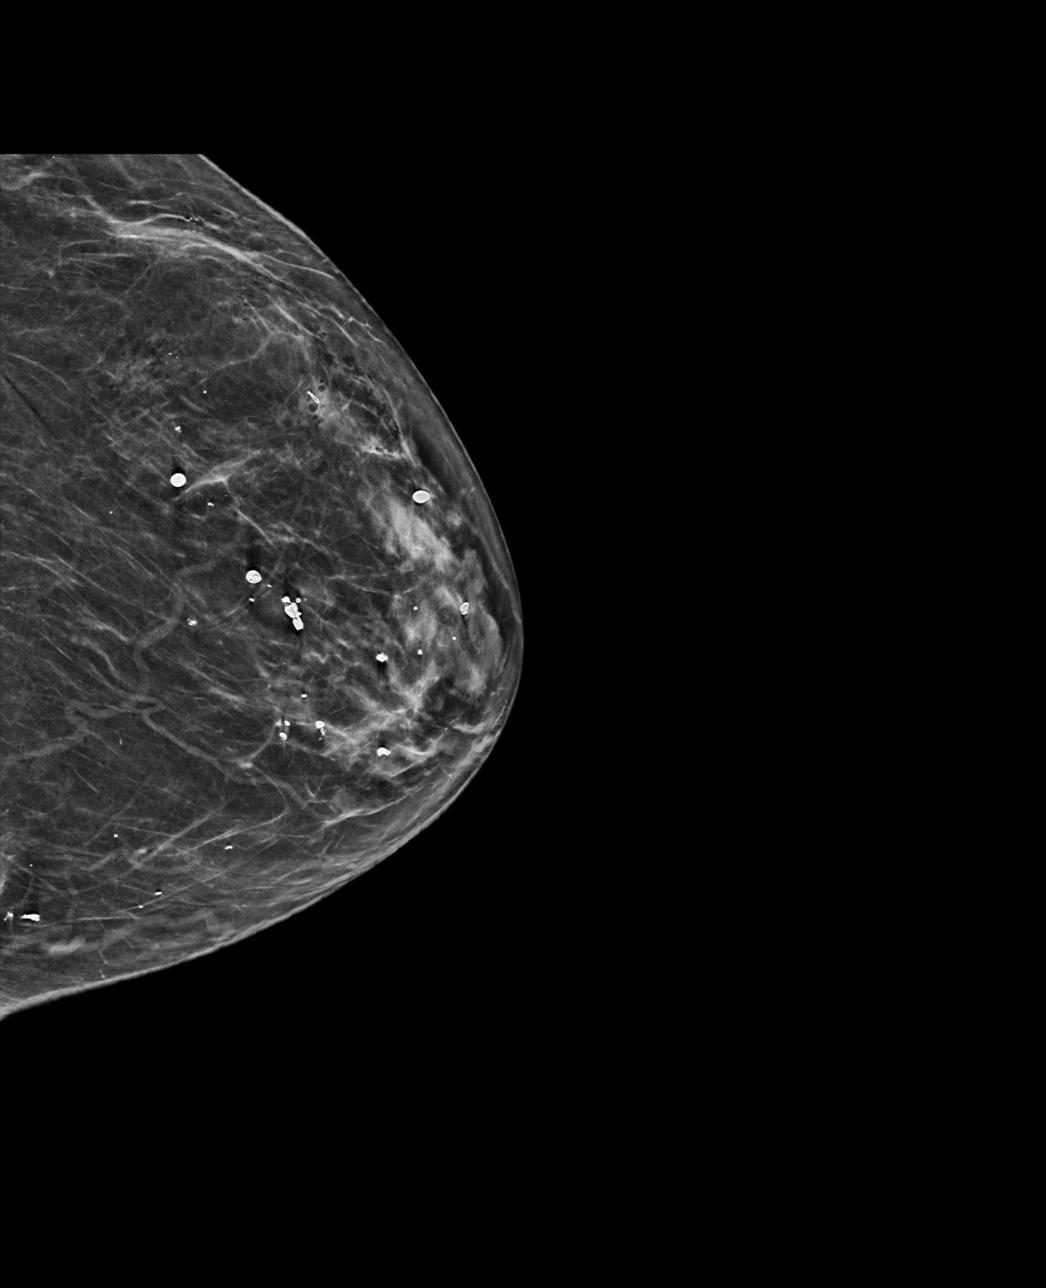

[L ML tomo · tomo slice 35/68.0]
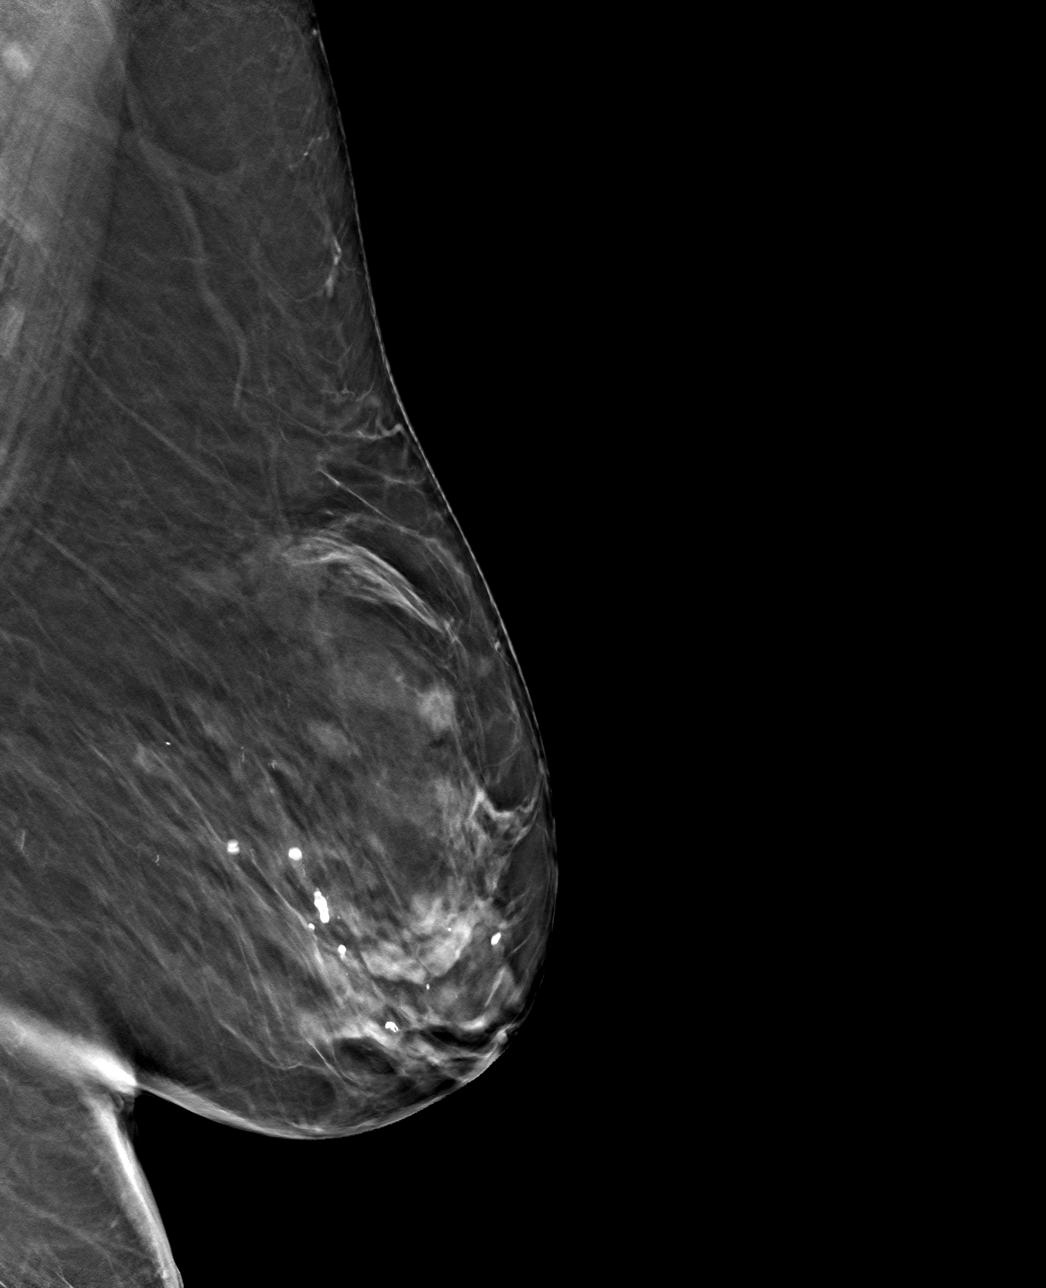

[L CC tomo · tomo slice 31/62.0]
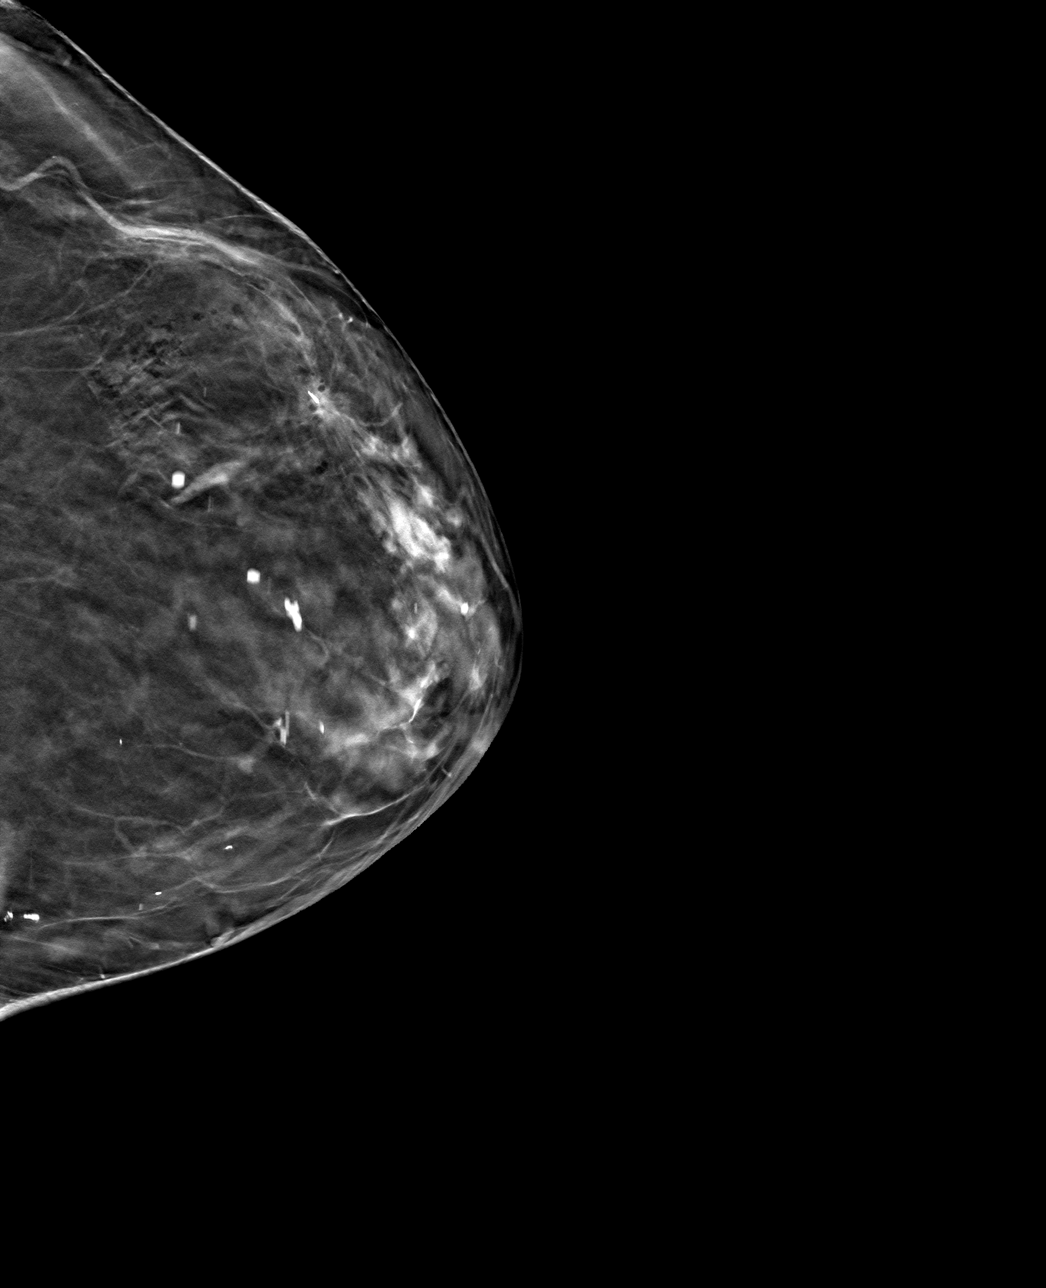

[4 of 12 positions shown; findings below may reference images not displayed]

FINDINGS: 3D Mammographic images were obtained following ultrasound guided
biopsy of a left breast mass. The biopsy marking clip is in the
biopsied left breast mass.
IMPRESSION: Appropriate positioning of the ribbon shaped biopsy marking clip at
the site of biopsy in the biopsied left breast mass.

Final Assessment: Post Procedure Mammograms for Marker Placement

## 2023-06-24 IMAGING — US US BREAST BX W LOC DEV 1ST LESION IMG BX SPEC US GUIDE*L*
1 series · 13 of 13 positions shown · non-contrast
Comparison: Previous exam(s).
COMPARISON: Previous exam(s).

Addendum:
CLINICAL DATA: Ultrasound-guided biopsy of a 1 o'clock left breast
mass.

EXAM:
ULTRASOUND GUIDED LEFT BREAST CORE NEEDLE BIOPSY

[Series 1: us breast bx w loc dev 1st lesion img bx spec us g · 0.07mm/px · 13 of 13 slices shown]
[im 1/13]
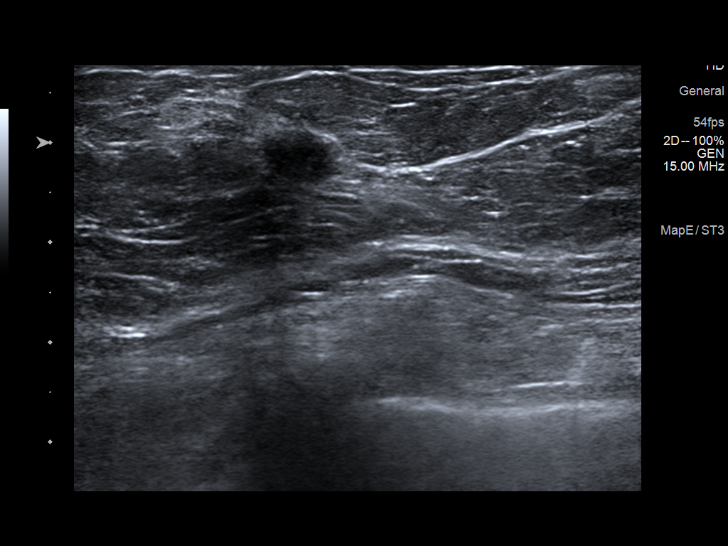
[im 2/13]
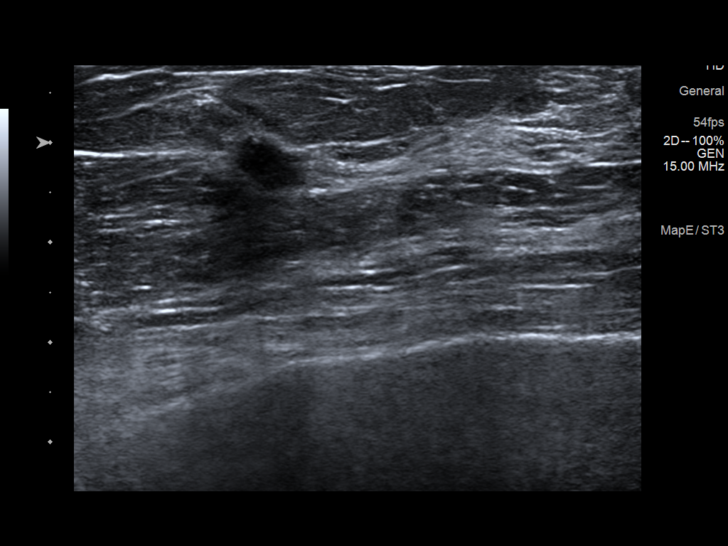
[im 3/13]
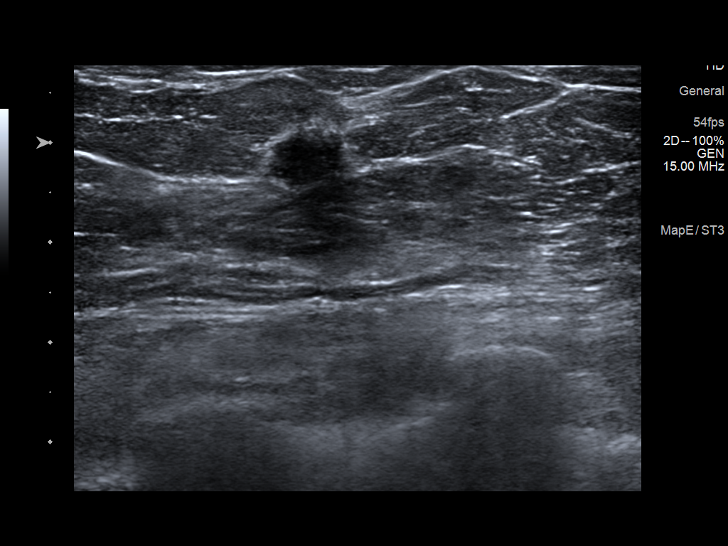
[im 4/13]
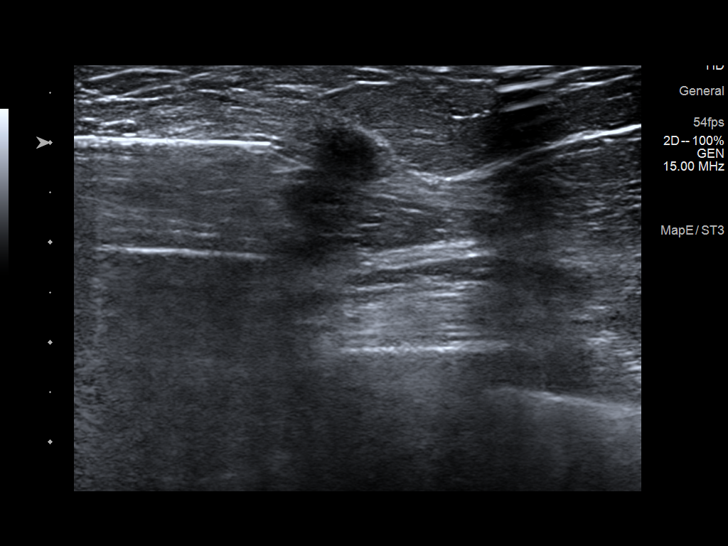
[im 5/13]
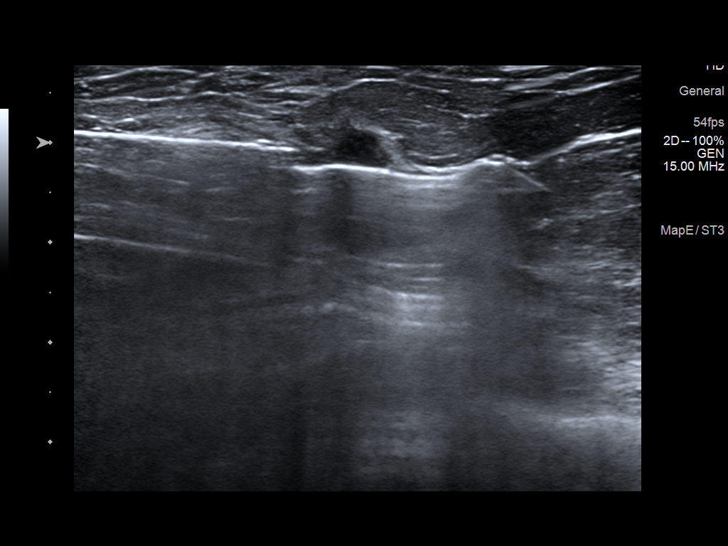
[im 6/13]
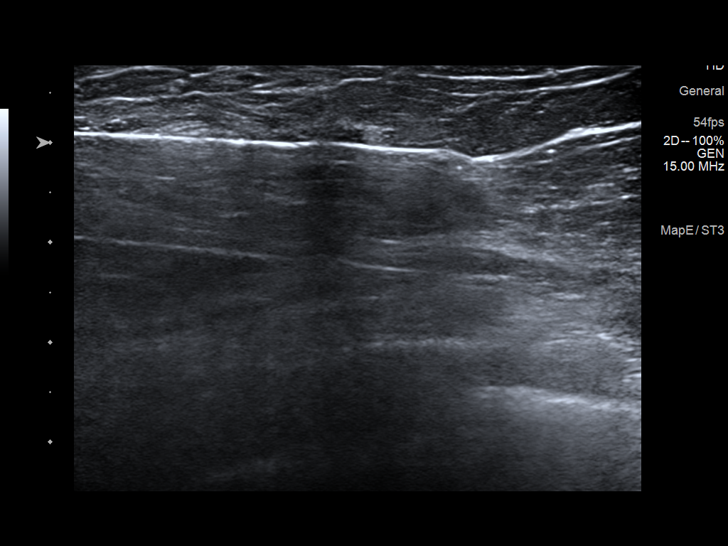
[im 7/13]
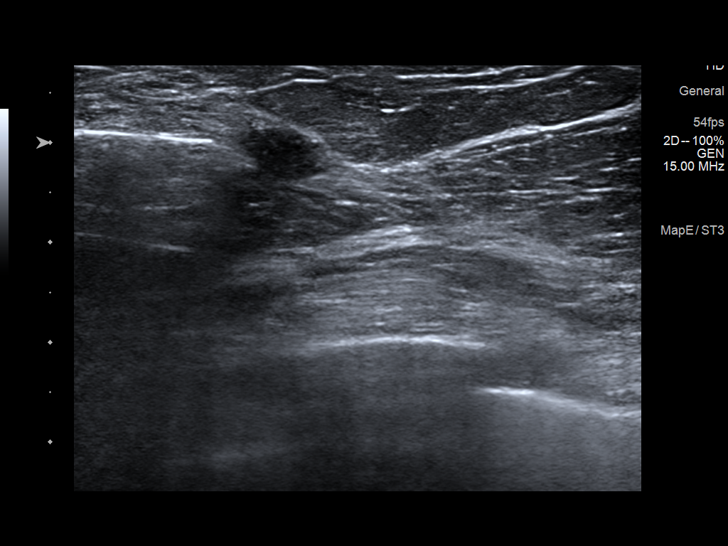
[im 8/13]
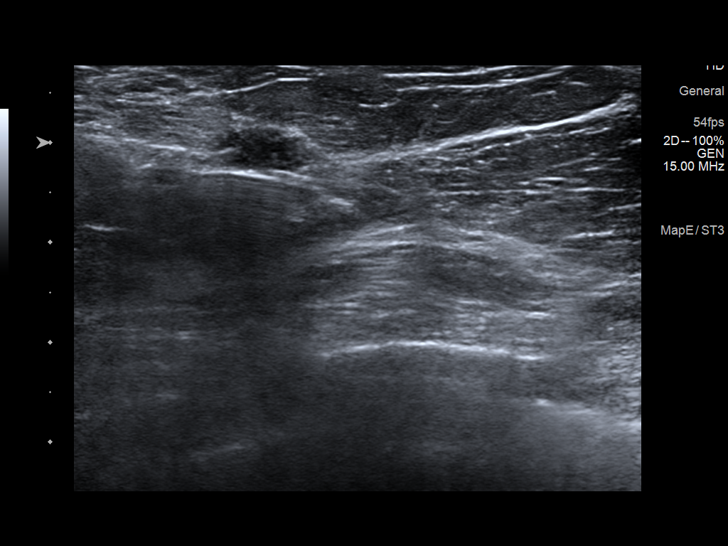
[im 9/13]
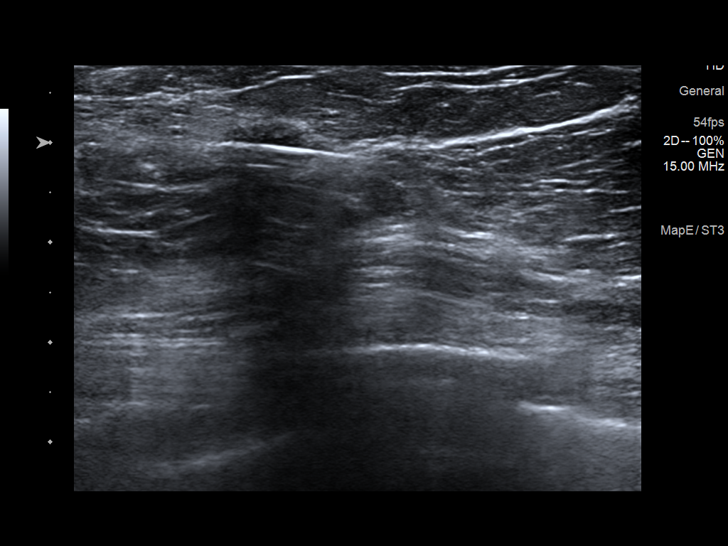
[im 10/13]
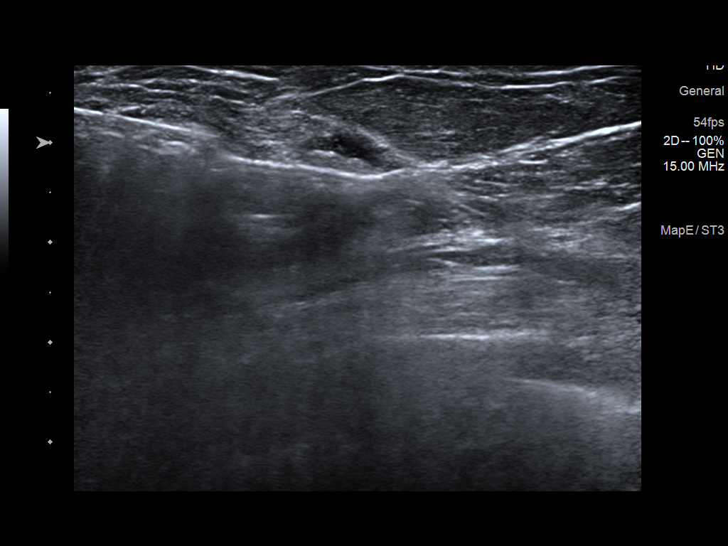
[im 11/13]
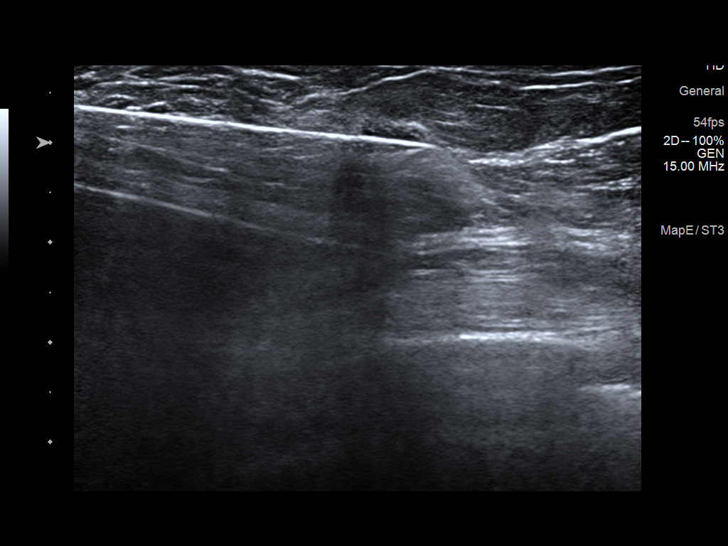
[im 12/13]
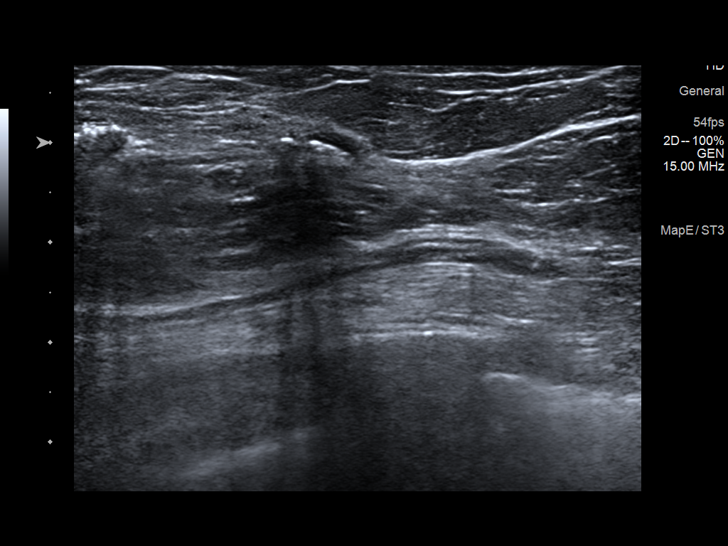
[im 13/13]
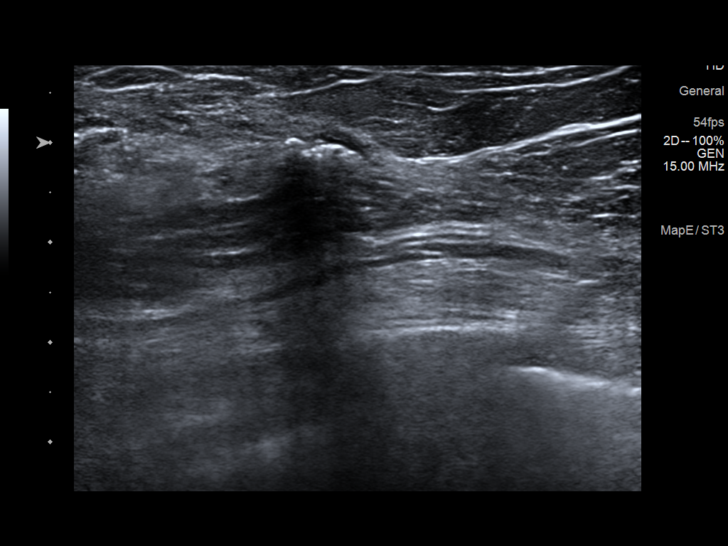

[13 of 13 positions shown; findings below may reference images not displayed]



Lesion quadrant: 1 o'clock left breast mass

Using sterile technique and 1% Lidocaine as local anesthetic, under
direct ultrasound visualization, a 12 gauge Bino device was
used to perform biopsy of a 1 o'clock left breast mass using a
lateral approach. At the conclusion of the procedure a ribbon shaped
tissue marker clip was deployed into the biopsy cavity. Follow up 2
view mammogram was performed and dictated separately.
IMPRESSION: Ultrasound guided biopsy of a 1 o'clock left breast mass. No
apparent complications.

ADDENDUM:
Pathology revealed GRADE I-II INVASIVE MAMMARY CARCINOMA of the LEFT
breast, 1 o'clock, (ribbon clip). This was found to be concordant by
Dr. Andria Archie.

Pathology results were discussed with the patient by telephone. The
patient reported doing well after the biopsy with tenderness at the
site. Post biopsy instructions and care were reviewed and questions
were answered. The patient was encouraged to call The [REDACTED] for any additional concerns. My direct phone
number was provided.

Surgical consultation has been arranged with Dr. Semada Arwa at
[[email or web address]] on August 29, 2021.

Pathology results reported by Elgabi Hering, RN on 08/18/2021.



Lesion quadrant: 1 o'clock left breast mass

Using sterile technique and 1% Lidocaine as local anesthetic, under
direct ultrasound visualization, a 12 gauge Bino device was
used to perform biopsy of a 1 o'clock left breast mass using a
lateral approach. At the conclusion of the procedure a ribbon shaped
tissue marker clip was deployed into the biopsy cavity. Follow up 2
view mammogram was performed and dictated separately.
IMPRESSION: Ultrasound guided biopsy of a 1 o'clock left breast mass. No
apparent complications.

## 2023-06-26 ENCOUNTER — Ambulatory Visit: Payer: Medicare HMO | Admitting: Internal Medicine

## 2023-07-01 DIAGNOSIS — Z96651 Presence of right artificial knee joint: Secondary | ICD-10-CM | POA: Diagnosis not present

## 2023-07-01 DIAGNOSIS — M25562 Pain in left knee: Secondary | ICD-10-CM | POA: Diagnosis not present

## 2023-07-01 DIAGNOSIS — M25561 Pain in right knee: Secondary | ICD-10-CM | POA: Diagnosis not present

## 2023-07-03 ENCOUNTER — Ambulatory Visit (INDEPENDENT_AMBULATORY_CARE_PROVIDER_SITE_OTHER): Payer: Medicare HMO | Admitting: Internal Medicine

## 2023-07-03 ENCOUNTER — Encounter: Payer: Self-pay | Admitting: Internal Medicine

## 2023-07-03 VITALS — BP 130/80 | HR 76 | Temp 98.7°F | Wt 168.5 lb

## 2023-07-03 DIAGNOSIS — E782 Mixed hyperlipidemia: Secondary | ICD-10-CM

## 2023-07-03 DIAGNOSIS — E1139 Type 2 diabetes mellitus with other diabetic ophthalmic complication: Secondary | ICD-10-CM | POA: Diagnosis not present

## 2023-07-03 DIAGNOSIS — I10 Essential (primary) hypertension: Secondary | ICD-10-CM | POA: Diagnosis not present

## 2023-07-03 DIAGNOSIS — G894 Chronic pain syndrome: Secondary | ICD-10-CM

## 2023-07-03 LAB — POCT GLYCOSYLATED HEMOGLOBIN (HGB A1C): Hemoglobin A1C: 6.5 % — AB (ref 4.0–5.6)

## 2023-07-03 MED ORDER — HYDROCODONE-ACETAMINOPHEN 5-325 MG PO TABS
1.0000 | ORAL_TABLET | Freq: Four times a day (QID) | ORAL | 0 refills | Status: DC | PRN
Start: 2023-07-03 — End: 2023-10-14

## 2023-07-03 MED ORDER — HYDROCODONE-ACETAMINOPHEN 5-325 MG PO TABS
1.0000 | ORAL_TABLET | Freq: Three times a day (TID) | ORAL | 0 refills | Status: DC
Start: 2023-07-03 — End: 2023-10-14

## 2023-07-03 MED ORDER — HYDROCODONE-ACETAMINOPHEN 5-325 MG PO TABS
ORAL_TABLET | ORAL | 0 refills | Status: DC
Start: 2023-07-03 — End: 2023-10-14

## 2023-07-03 NOTE — Progress Notes (Signed)
Established Patient Office Visit     CC/Reason for Visit: Follow-up chronic conditions  HPI: [name] is a 82 y.o. female who is coming in today for the above mentioned reasons. Past Medical History is significant for: Hypertension, hyperlipidemia, type 2 diabetes, breast cancer, generalized anxiety disorder, OSA, chronic pain syndrome on hydrocodone.  Feeling well today without major concerns or complaints.   Past Medical/Surgical History: Past Medical History:  Diagnosis Date   Allergy    Anxiety    Arthritis    PAIN AND OA RIGHT KNEE; PAIN AND DDD AND SPINAL STENOSIS   Complication of anesthesia    PULLED OUT MY IV'S AND FOLEY - HALLUCINATIONS WITH HIATAL HERNIA SURGERY   Depression    Diabetes mellitus without complication (HCC)    Diverticulosis    PER COLONOSCOPY   GERD (gastroesophageal reflux disease)    OVER THE COUNTER MEDS IF NEEDED   Heart murmur    "VERY SLIGHT ALL MY LIFE"   Hyperlipidemia    Hypertension    Sleep apnea    does not tolerate CPAP - HAS NOT USED IN OVER 2 YRS    Past Surgical History:  Procedure Laterality Date   BACK SURGERY     BREAST LUMPECTOMY WITH RADIOACTIVE SEED LOCALIZATION Left 09/26/2021   Procedure: LEFT BREAST LUMPECTOMY WITH RADIOACTIVE SEED LOCALIZATION;  Surgeon: Abigail Miyamoto, MD;  Location: [facility];  Service: General;  Laterality: Left;   COLONOSCOPY     EYE SURGERY     bilateral cataracts   HIATAL HERNIA REPAIR  09/03/2006   LUMBAR FUSION     LUMBAR LAMINECTOMY     PARTIAL KNEE ARTHROPLASTY Right 07/02/2014   Procedure: RIGHT MEDIAL UNICOMPARTMENTAL KNEE;  Surgeon: Shelda Pal, MD;  Location: [facility];  Service: Orthopedics;  Laterality: Right;   UPPER GASTROINTESTINAL ENDOSCOPY      Social History:  reports that she has never smoked. She has never used smokeless tobacco. She reports that she does not drink alcohol and does not use drugs.  Allergies: Allergies  Allergen Reactions   Ambien  [Zolpidem Tartrate]     amnesia   Hydrochlorothiazide     Other reaction(s): Lethargy (intolerance)   Prednisone     Rapid heart rate   Thiazide-Type Diuretics Other (See Comments)    "could not physically move"   Sulfamethoxazole Rash    Family History:  Family History  Problem Relation Age of Onset   Heart disease Mother    Breast cancer Mother    Heart disease Father    Heart disease Sister    Pulmonary embolism Sister    Diabetes Paternal Grandfather      Current Outpatient Medications:    Accu-Chek FastClix Lancets MISC, 1 each by Does not apply route daily., Disp: 100 each, Rfl: 12   ALPRAZolam (XANAX) 0.25 MG tablet, Take 1 tablet (0.25 mg total) by mouth 2 (two) times daily as needed. for anxiety, Disp: 60 tablet, Rfl: 2   amLODipine (NORVASC) 5 MG tablet, TAKE ONE TABLET BY MOUTH DAILY, Disp: 90 tablet, Rfl: 1   Artificial Tear Ointment (DRY EYES OP), Place 1 drop into both eyes at bedtime as needed (Dry eye)., Disp: , Rfl:    Blood Glucose Monitoring Suppl (ACCU-CHEK GUIDE ME) w/Device KIT, 1 each by Does not apply route daily., Disp: 1 kit, Rfl: 2   COVID-19 mRNA vaccine 2023-2024 (COMIRNATY) syringe, Inject into the muscle., Disp: 0.3 mL, Rfl: 0   diclofenac (VOLTAREN) 75  MG EC tablet, Take 1 tablet twice a day by oral route with meal(s)., Disp: , Rfl:    diclofenac Sodium (VOLTAREN) 1 % GEL, Apply 4 g topically 4 (four) times daily. Apply to bilateral knees., Disp: , Rfl:    diphenhydramine-acetaminophen (TYLENOL PM) 25-500 MG TABS tablet, Take 2 tablets by mouth at bedtime., Disp: , Rfl:    fluticasone (CUTIVATE) 0.005 % ointment, Apply 1 application topically daily., Disp: , Rfl:    glucose blood (ACCU-CHEK GUIDE) test strip, 1 each by Other route daily. Use as instructed, Disp: 100 each, Rfl: 12   ketoconazole (NIZORAL) 2 % shampoo, Apply 1 application topically 3 (three) times a week., Disp: , Rfl:    lisinopril (ZESTRIL) 5 MG tablet, TAKE ONE TABLET BY MOUTH IN  THE MORNING AND TAKE ONE TABLET AT BEDTIME, Disp: 180 tablet, Rfl: 1   mometasone (NASONEX) 50 MCG/ACT nasal spray, Place 2 sprays into the nose at bedtime., Disp: 17 g, Rfl: 6   oxybutynin (DITROPAN-XL) 10 MG 24 hr tablet, Take 1 tablet (10 mg total) by mouth daily., Disp: 90 tablet, Rfl: 1   raNITIdine HCl (ZANTAC PO), Take 4 mg by mouth at bedtime., Disp: , Rfl:    Sennosides (EX-LAX PO), Take 1 tablet by mouth at bedtime., Disp: , Rfl:    simvastatin (ZOCOR) 20 MG tablet, Take 1 tablet (20 mg total) by mouth at bedtime., Disp: 90 tablet, Rfl: 1   tamoxifen (NOLVADEX) 20 MG tablet, TAKE ONE TABLET BY MOUTH DAILY, Disp: 90 tablet, Rfl: 3   triamcinolone (NASACORT) 55 MCG/ACT AERO nasal inhaler, Place 2 sprays into the nose daily., Disp: , Rfl:    triamcinolone cream (KENALOG) 0.1 %, APPLY TO RASH SPARINGLY ONCE DAILY, Disp: 30 g, Rfl: 0   HYDROcodone-acetaminophen (NORCO/VICODIN) 5-325 MG tablet, Take 1 tablet by mouth every 6 (six) hours as needed for moderate pain (pain score 4-6)., Disp: 90 tablet, Rfl: 0   HYDROcodone-acetaminophen (NORCO/VICODIN) 5-325 MG tablet, Take 1 tablet by mouth 3 (three) times daily., Disp: 90 tablet, Rfl: 0   HYDROcodone-acetaminophen (NORCO/VICODIN) 5-325 MG tablet, TAKE ONE TABLET BY MOUTH THREE TIMES DAILY AS NEEDED MODERATE OR FOR SEVERE PAIN, Disp: 90 tablet, Rfl: 0   meloxicam (MOBIC) 15 MG tablet, Take 1 tablet (15 mg total) by mouth daily. (Patient not taking: Reported on 07/03/2023), Disp: 90 tablet, Rfl: 0  Review of Systems:  Negative unless indicated in HPI.   Physical Exam: Vitals:   07/03/23 1511  BP: 130/80  Pulse: 76  Temp: 98.7 F (37.1 C)  TempSrc: Oral  SpO2: 99%  Weight: 168 lb 8 oz (76.4 kg)    Body mass index is 29.85 kg/m.   Physical Exam Vitals reviewed.  Constitutional:      Appearance: Normal appearance.  HENT:     Head: Normocephalic and atraumatic.  Eyes:     Conjunctiva/sclera: Conjunctivae normal.     Pupils:  Pupils are equal, round, and reactive to light.  Cardiovascular:     Rate and Rhythm: Normal rate and regular rhythm.  Pulmonary:     Effort: Pulmonary effort is normal.     Breath sounds: Normal breath sounds.  Skin:    General: Skin is warm and dry.  Neurological:     General: No focal deficit present.     Mental Status: She is alert and oriented to person, place, and time.  Psychiatric:        Mood and Affect: Mood normal.  Behavior: Behavior normal.        Thought Content: Thought content normal.        Judgment: Judgment normal.      Impression and Plan:  Type 2 diabetes mellitus with other ophthalmic complication, without long-term current use of insulin (HCC) -     Microalbumin / creatinine urine ratio; Future -     POCT glycosylated hemoglobin (Hb A1C)  Chronic pain syndrome -     HYDROcodone-Acetaminophen; Take 1 tablet by mouth every 6 (six) hours as needed for moderate pain (pain score 4-6).  Dispense: 90 tablet; Refill: 0 -     HYDROcodone-Acetaminophen; Take 1 tablet by mouth 3 (three) times daily.  Dispense: 90 tablet; Refill: 0 -     HYDROcodone-Acetaminophen; TAKE ONE TABLET BY MOUTH THREE TIMES DAILY AS NEEDED MODERATE OR FOR SEVERE PAIN  Dispense: 90 tablet; Refill: 0  Essential hypertension  Mixed hyperlipidemia   -Blood pressure is well-controlled on current. -A1c of 6.5 demonstrates excellent diabetic control. -Last LDL of 58 shows well-controlled hyperlipidemia on simvastatin. -PDMP reviewed, no red flags, overdose risk score is 90.  Refill hydrocodone 5/325 mg to take 1 tablets every 8 hours as needed for pain for total of 90 tablets a month x 3 months.  Time spent:31 minutes reviewing chart, interviewing and examining patient and formulating plan of care.     Chaya Jan, MD [facility] at [facility]

## 2023-07-17 ENCOUNTER — Ambulatory Visit
Admission: RE | Admit: 2023-07-17 | Discharge: 2023-07-17 | Disposition: A | Discharge status: Home / Self Care | Payer: Medicare HMO | Source: Ambulatory Visit | Attending: Nurse Practitioner

## 2023-07-17 DIAGNOSIS — N6489 Other specified disorders of breast: Secondary | ICD-10-CM | POA: Diagnosis not present

## 2023-07-17 DIAGNOSIS — Z170 Estrogen receptor positive status [ER+]: Secondary | ICD-10-CM

## 2023-07-30 ENCOUNTER — Other Ambulatory Visit: Payer: Self-pay | Admitting: Internal Medicine

## 2023-07-30 DIAGNOSIS — E785 Hyperlipidemia, unspecified: Secondary | ICD-10-CM

## 2023-07-30 DIAGNOSIS — N3281 Overactive bladder: Secondary | ICD-10-CM

## 2023-08-07 ENCOUNTER — Other Ambulatory Visit: Payer: Self-pay | Admitting: Internal Medicine

## 2023-08-07 DIAGNOSIS — F411 Generalized anxiety disorder: Secondary | ICD-10-CM

## 2023-09-11 DIAGNOSIS — M170 Bilateral primary osteoarthritis of knee: Secondary | ICD-10-CM | POA: Diagnosis not present

## 2023-09-21 DIAGNOSIS — M546 Pain in thoracic spine: Secondary | ICD-10-CM | POA: Diagnosis not present

## 2023-09-26 ENCOUNTER — Telehealth: Payer: Self-pay | Admitting: *Deleted

## 2023-09-26 DIAGNOSIS — G894 Chronic pain syndrome: Secondary | ICD-10-CM

## 2023-09-26 DIAGNOSIS — M549 Dorsalgia, unspecified: Secondary | ICD-10-CM

## 2023-09-26 NOTE — Telephone Encounter (Signed)
Referral placed.

## 2023-09-26 NOTE — Telephone Encounter (Signed)
Copied from CRM [id number]. Topic: Clinical - Medical Advice >> Sep 26, 2023 10:43 AM Lennart Pall wrote: Reason for CRM: Patient fell a week ago on her bottom. She went to the emergency room and had xrays and nothing was broken. The pain in her back is making it hard for her to walk, sit and sleep. Patient is looking for a referral to Dr Ilean Skill- Neurosurgeon. She does not want to come in for an appointment

## 2023-10-03 DIAGNOSIS — T148XXA Other injury of unspecified body region, initial encounter: Secondary | ICD-10-CM | POA: Diagnosis not present

## 2023-10-03 DIAGNOSIS — M546 Pain in thoracic spine: Secondary | ICD-10-CM | POA: Diagnosis not present

## 2023-10-09 ENCOUNTER — Ambulatory Visit: Payer: Medicare HMO | Admitting: Internal Medicine

## 2023-10-11 ENCOUNTER — Other Ambulatory Visit: Payer: Self-pay | Admitting: Internal Medicine

## 2023-10-11 DIAGNOSIS — I10 Essential (primary) hypertension: Secondary | ICD-10-CM

## 2023-10-14 ENCOUNTER — Encounter: Payer: Self-pay | Admitting: Internal Medicine

## 2023-10-14 ENCOUNTER — Other Ambulatory Visit: Payer: Self-pay | Admitting: Internal Medicine

## 2023-10-14 ENCOUNTER — Ambulatory Visit (INDEPENDENT_AMBULATORY_CARE_PROVIDER_SITE_OTHER): Payer: Medicare HMO | Admitting: Internal Medicine

## 2023-10-14 VITALS — BP 124/84 | HR 76 | Temp 98.0°F | Wt 137.0 lb

## 2023-10-14 DIAGNOSIS — G894 Chronic pain syndrome: Secondary | ICD-10-CM | POA: Diagnosis not present

## 2023-10-14 DIAGNOSIS — E1139 Type 2 diabetes mellitus with other diabetic ophthalmic complication: Secondary | ICD-10-CM | POA: Diagnosis not present

## 2023-10-14 DIAGNOSIS — I10 Essential (primary) hypertension: Secondary | ICD-10-CM

## 2023-10-14 DIAGNOSIS — M546 Pain in thoracic spine: Secondary | ICD-10-CM

## 2023-10-14 DIAGNOSIS — N3281 Overactive bladder: Secondary | ICD-10-CM

## 2023-10-14 DIAGNOSIS — F411 Generalized anxiety disorder: Secondary | ICD-10-CM

## 2023-10-14 DIAGNOSIS — E785 Hyperlipidemia, unspecified: Secondary | ICD-10-CM

## 2023-10-14 LAB — CBC WITH DIFFERENTIAL/PLATELET
Basophils Absolute: 0.1 10*3/uL (ref 0.0–0.1)
Basophils Relative: 1.1 % (ref 0.0–3.0)
Eosinophils Absolute: 0.2 10*3/uL (ref 0.0–0.7)
Eosinophils Relative: 2.8 % (ref 0.0–5.0)
HCT: 40.2 % (ref 36.0–46.0)
Hemoglobin: 13.1 g/dL (ref 12.0–15.0)
Lymphocytes Relative: 33 % (ref 12.0–46.0)
Lymphs Abs: 2.5 10*3/uL (ref 0.7–4.0)
MCHC: 32.6 g/dL (ref 30.0–36.0)
MCV: 100.7 fL — ABNORMAL HIGH (ref 78.0–100.0)
Monocytes Absolute: 0.6 10*3/uL (ref 0.1–1.0)
Monocytes Relative: 7.7 % (ref 3.0–12.0)
Neutro Abs: 4.3 10*3/uL (ref 1.4–7.7)
Neutrophils Relative %: 55.4 % (ref 43.0–77.0)
Platelets: 237 10*3/uL (ref 150.0–400.0)
RBC: 4 Mil/uL (ref 3.87–5.11)
RDW: 13.4 % (ref 11.5–15.5)
WBC: 7.7 10*3/uL (ref 4.0–10.5)

## 2023-10-14 LAB — COMPREHENSIVE METABOLIC PANEL
ALT: 7 U/L (ref 0–35)
AST: 15 U/L (ref 0–37)
Albumin: 3.9 g/dL (ref 3.5–5.2)
Alkaline Phosphatase: 58 U/L (ref 39–117)
BUN: 11 mg/dL (ref 6–23)
CO2: 27 meq/L (ref 19–32)
Calcium: 9.1 mg/dL (ref 8.4–10.5)
Chloride: 103 meq/L (ref 96–112)
Creatinine, Ser: 0.77 mg/dL (ref 0.40–1.20)
GFR: 71.28 mL/min (ref >60.00)
Glucose, Bld: 126 mg/dL — ABNORMAL HIGH (ref 70–99)
Potassium: 3.9 meq/L (ref 3.5–5.1)
Sodium: 139 meq/L (ref 135–145)
Total Bilirubin: 0.5 mg/dL (ref 0.2–1.2)
Total Protein: 6.8 g/dL (ref 6.0–8.3)

## 2023-10-14 LAB — LIPID PANEL
Cholesterol: 113 mg/dL (ref 0–200)
HDL: 45.9 mg/dL (ref >39.00)
LDL Cholesterol: 48 mg/dL (ref 0–99)
NonHDL: 66.73
Total CHOL/HDL Ratio: 2
Triglycerides: 94 mg/dL (ref 0.0–149.0)
VLDL: 18.8 mg/dL (ref 0.0–40.0)

## 2023-10-14 LAB — POCT GLYCOSYLATED HEMOGLOBIN (HGB A1C): Hemoglobin A1C: 6 % — AB (ref 4.0–5.6)

## 2023-10-14 MED ORDER — SIMVASTATIN 20 MG PO TABS: 20.0000 mg | ORAL_TABLET | Freq: Every day | ORAL | 1 refills | Status: DC

## 2023-10-14 MED ORDER — HYDROCODONE-ACETAMINOPHEN 5-325 MG PO TABS: ORAL_TABLET | ORAL | 0 refills | Status: DC

## 2023-10-14 MED ORDER — LISINOPRIL 5 MG PO TABS: ORAL_TABLET | ORAL | 1 refills | Status: DC

## 2023-10-14 MED ORDER — ALPRAZOLAM 0.25 MG PO TABS: 0.2500 mg | ORAL_TABLET | Freq: Two times a day (BID) | ORAL | 2 refills | Status: DC | PRN

## 2023-10-14 MED ORDER — MELOXICAM 15 MG PO TABS: 15.0000 mg | ORAL_TABLET | Freq: Every day | ORAL | 0 refills | Status: DC

## 2023-10-14 MED ORDER — HYDROCODONE-ACETAMINOPHEN 5-325 MG PO TABS: 1.0000 | ORAL_TABLET | Freq: Four times a day (QID) | ORAL | 0 refills | Status: DC | PRN

## 2023-10-14 MED ORDER — AMLODIPINE BESYLATE 5 MG PO TABS: 5.0000 mg | ORAL_TABLET | Freq: Every day | ORAL | 1 refills | Status: DC

## 2023-10-14 MED ORDER — OXYBUTYNIN CHLORIDE ER 10 MG PO TB24: 10.0000 mg | ORAL_TABLET | Freq: Every day | ORAL | 1 refills | Status: DC

## 2023-10-14 MED ORDER — HYDROCODONE-ACETAMINOPHEN 5-325 MG PO TABS: 1.0000 | ORAL_TABLET | Freq: Three times a day (TID) | ORAL | 0 refills | Status: DC

## 2023-10-14 NOTE — Progress Notes (Signed)
 Established Patient Office Visit     CC/Reason for Visit: Follow-up chronic medical conditions, medication refills  HPI: [name] is a 83 y.o. female who is coming in today for the above mentioned reasons. Past Medical History is significant for: Hypertension, hyperlipidemia, type 2 diabetes, history of breast cancer, chronic pain syndrome on hydrocodone , OSA, generalized anxiety disorder.  She fell after she slipped on ice and has been dealing with back pain ever since.  Has seen orthopedics and is on Robaxin .  Has not yet started physical therapy.  She is requiring medication refills today.   Past Medical/Surgical History: Past Medical History:  Diagnosis Date   Allergy    Anxiety    Arthritis    PAIN AND OA RIGHT KNEE; PAIN AND DDD AND SPINAL STENOSIS   Complication of anesthesia    PULLED OUT MY IV'S AND FOLEY - HALLUCINATIONS WITH HIATAL HERNIA SURGERY   Depression    Diabetes mellitus without complication (HCC)    Diverticulosis    PER COLONOSCOPY   GERD (gastroesophageal reflux disease)    OVER THE COUNTER MEDS IF NEEDED   Heart murmur    "VERY SLIGHT ALL MY LIFE"   Hyperlipidemia    Hypertension    Sleep apnea    does not tolerate CPAP - HAS NOT USED IN OVER 2 YRS    Past Surgical History:  Procedure Laterality Date   BACK SURGERY     BREAST LUMPECTOMY WITH RADIOACTIVE SEED LOCALIZATION Left 09/26/2021   Procedure: LEFT BREAST LUMPECTOMY WITH RADIOACTIVE SEED LOCALIZATION;  Surgeon: Oza Blumenthal, MD;  Location: [facility];  Service: General;  Laterality: Left;   COLONOSCOPY     EYE SURGERY     bilateral cataracts   HIATAL HERNIA REPAIR  09/03/2006   LUMBAR FUSION     LUMBAR LAMINECTOMY     PARTIAL KNEE ARTHROPLASTY Right 07/02/2014   Procedure: RIGHT MEDIAL UNICOMPARTMENTAL KNEE;  Surgeon: Bevin Bucks, MD;  Location: [facility];  Service: Orthopedics;  Laterality: Right;   UPPER GASTROINTESTINAL ENDOSCOPY      Social History:  reports that she  has never smoked. She has never used smokeless tobacco. She reports that she does not drink alcohol  and does not use drugs.  Allergies: Allergies  Allergen Reactions   Ambien  [Zolpidem  Tartrate]     amnesia   Hydrochlorothiazide      Other reaction(s): Lethargy (intolerance)   Prednisone     Rapid heart rate   Thiazide-Type Diuretics Other (See Comments)    "could not physically move"   Sulfamethoxazole Rash    Family History:  Family History  Problem Relation Age of Onset   Heart disease Mother    Breast cancer Mother    Heart disease Father    Heart disease Sister    Pulmonary embolism Sister    Diabetes Paternal Grandfather      Current Outpatient Medications:    Accu-Chek FastClix Lancets MISC, 1 each by Does not apply route daily., Disp: 100 each, Rfl: 12   Artificial Tear Ointment (DRY EYES OP), Place 1 drop into both eyes at bedtime as needed (Dry eye)., Disp: , Rfl:    Blood Glucose Monitoring Suppl (ACCU-CHEK GUIDE ME) w/Device KIT, 1 each by Does not apply route daily., Disp: 1 kit, Rfl: 2   COVID-19 mRNA vaccine 2023-2024 (COMIRNATY ) syringe, Inject into the muscle., Disp: 0.3 mL, Rfl: 0   diclofenac (VOLTAREN) 75 MG EC tablet, Take 1 tablet twice a day by oral  route with meal(s)., Disp: , Rfl:    diclofenac Sodium (VOLTAREN) 1 % GEL, Apply 4 g topically 4 (four) times daily. Apply to bilateral knees., Disp: , Rfl:    diphenhydramine -acetaminophen  (TYLENOL  PM) 25-500 MG TABS tablet, Take 2 tablets by mouth at bedtime., Disp: , Rfl:    fluticasone  (CUTIVATE ) 0.005 % ointment, Apply 1 application topically daily., Disp: , Rfl:    glucose blood (ACCU-CHEK GUIDE) test strip, 1 each by Other route daily. Use as instructed, Disp: 100 each, Rfl: 12   ketoconazole (NIZORAL) 2 % shampoo, Apply 1 application topically 3 (three) times a week., Disp: , Rfl:    methocarbamol  (ROBAXIN ) 500 MG tablet, Take 1 tablet every 6-8 hours by oral route as needed for spasm for 10 days.,  Disp: , Rfl:    mometasone  (NASONEX ) 50 MCG/ACT nasal spray, Place 2 sprays into the nose at bedtime., Disp: 17 g, Rfl: 6   raNITIdine  HCl (ZANTAC  PO), Take 4 mg by mouth at bedtime., Disp: , Rfl:    Sennosides (EX-LAX PO), Take 1 tablet by mouth at bedtime., Disp: , Rfl:    tamoxifen  (NOLVADEX ) 20 MG tablet, TAKE ONE TABLET BY MOUTH DAILY, Disp: 90 tablet, Rfl: 3   triamcinolone  (NASACORT ) 55 MCG/ACT AERO nasal inhaler, Place 2 sprays into the nose daily., Disp: , Rfl:    triamcinolone  cream (KENALOG ) 0.1 %, APPLY TO RASH SPARINGLY ONCE DAILY, Disp: 30 g, Rfl: 0   ALPRAZolam  (XANAX ) 0.25 MG tablet, Take 1 tablet (0.25 mg total) by mouth 2 (two) times daily as needed. for anxiety, Disp: 60 tablet, Rfl: 2   amLODipine  (NORVASC ) 5 MG tablet, Take 1 tablet (5 mg total) by mouth daily., Disp: 90 tablet, Rfl: 1   HYDROcodone -acetaminophen  (NORCO/VICODIN) 5-325 MG tablet, Take 1 tablet by mouth every 6 (six) hours as needed for moderate pain (pain score 4-6)., Disp: 90 tablet, Rfl: 0   HYDROcodone -acetaminophen  (NORCO/VICODIN) 5-325 MG tablet, Take 1 tablet by mouth 3 (three) times daily., Disp: 90 tablet, Rfl: 0   HYDROcodone -acetaminophen  (NORCO/VICODIN) 5-325 MG tablet, TAKE ONE TABLET BY MOUTH THREE TIMES DAILY AS NEEDED MODERATE OR FOR SEVERE PAIN, Disp: 90 tablet, Rfl: 0   lisinopril  (ZESTRIL ) 5 MG tablet, TAKE ONE TABLET BY MOUTH IN THE MORNING AND TAKE ONE TABLET AT BEDTIME, Disp: 180 tablet, Rfl: 1   meloxicam  (MOBIC ) 15 MG tablet, Take 1 tablet (15 mg total) by mouth daily., Disp: 90 tablet, Rfl: 0   oxybutynin  (DITROPAN -XL) 10 MG 24 hr tablet, Take 1 tablet (10 mg total) by mouth daily., Disp: 90 tablet, Rfl: 1   simvastatin  (ZOCOR ) 20 MG tablet, Take 1 tablet (20 mg total) by mouth at bedtime., Disp: 90 tablet, Rfl: 1  Review of Systems:  Negative unless indicated in HPI.   Physical Exam: Vitals:   10/14/23 1112  BP: 124/84  Pulse: 76  Temp: 98 F (36.7 C)  TempSrc: Oral  SpO2: 96%   Weight: 137 lb (62.1 kg)    Body mass index is 24.27 kg/m.   Physical Exam Vitals reviewed.  Constitutional:      Appearance: Normal appearance.  HENT:     Head: Normocephalic and atraumatic.  Eyes:     Conjunctiva/sclera: Conjunctivae normal.     Pupils: Pupils are equal, round, and reactive to light.  Cardiovascular:     Rate and Rhythm: Normal rate and regular rhythm.  Pulmonary:     Effort: Pulmonary effort is normal.     Breath sounds: Normal breath sounds.  Skin:    General: Skin is warm and dry.  Neurological:     General: No focal deficit present.     Mental Status: She is alert and oriented to person, place, and time.  Psychiatric:        Mood and Affect: Mood normal.        Behavior: Behavior normal.        Thought Content: Thought content normal.        Judgment: Judgment normal.      Impression and Plan:  Type 2 diabetes mellitus with other ophthalmic complication, without long-term current use of insulin (HCC) -     POCT glycosylated hemoglobin (Hb A1C) -     Microalbumin / creatinine urine ratio; Future -     CBC with Differential/Platelet; Future -     Comprehensive metabolic panel; Future -     Lipid panel; Future  Generalized anxiety disorder -     ALPRAZolam ; Take 1 tablet (0.25 mg total) by mouth 2 (two) times daily as needed. for anxiety  Dispense: 60 tablet; Refill: 2  Essential hypertension -     amLODIPine  Besylate; Take 1 tablet (5 mg total) by mouth daily.  Dispense: 90 tablet; Refill: 1 -     Lisinopril ; TAKE ONE TABLET BY MOUTH IN THE MORNING AND TAKE ONE TABLET AT BEDTIME  Dispense: 180 tablet; Refill: 1  Chronic pain syndrome -     HYDROcodone -Acetaminophen ; Take 1 tablet by mouth every 6 (six) hours as needed for moderate pain (pain score 4-6).  Dispense: 90 tablet; Refill: 0 -     HYDROcodone -Acetaminophen ; Take 1 tablet by mouth 3 (three) times daily.  Dispense: 90 tablet; Refill: 0 -     HYDROcodone -Acetaminophen ; TAKE ONE TABLET  BY MOUTH THREE TIMES DAILY AS NEEDED MODERATE OR FOR SEVERE PAIN  Dispense: 90 tablet; Refill: 0 -     Meloxicam ; Take 1 tablet (15 mg total) by mouth daily.  Dispense: 90 tablet; Refill: 0  Overactive bladder -     oxyBUTYnin  Chloride ER; Take 1 tablet (10 mg total) by mouth daily.  Dispense: 90 tablet; Refill: 1  Hyperlipidemia, unspecified hyperlipidemia type -     Simvastatin ; Take 1 tablet (20 mg total) by mouth at bedtime.  Dispense: 90 tablet; Refill: 1   -PDMP reviewed, no red flags, overdose risk score is 70. -Refill hydrocodone  x 3 months. -Blood pressure is well-controlled on current. -A1c demonstrates excellent control at 6.0. -LDL is at goal on simvastatin .  Time spent:30 minutes reviewing chart, interviewing and examining patient and formulating plan of care.     Marguerita Shih, MD [facility] at [facility]

## 2023-10-15 ENCOUNTER — Encounter: Payer: Self-pay | Admitting: Internal Medicine

## 2023-10-15 LAB — MICROALBUMIN / CREATININE URINE RATIO
Creatinine,U: 95.2 mg/dL
Microalb Creat Ratio: 42.6 mg/g — ABNORMAL HIGH (ref 0.0–30.0)
Microalb, Ur: 4.1 mg/dL — ABNORMAL HIGH (ref 0.0–1.9)

## 2023-10-22 ENCOUNTER — Telehealth: Payer: Self-pay

## 2023-10-22 NOTE — Telephone Encounter (Signed)
Copied from CRM [id number]. Topic: Clinical - Lab/Test Results >> Oct 22, 2023 10:51 AM [name] wrote: Reason for CRM: patient called stating she does not understand the lab results in mychart. Patient stated her urinalysis is abnormal and she would like someone to call her

## 2023-10-25 NOTE — Telephone Encounter (Signed)
E2C2 called and pt will be aware md out of office and cma will be back on monday

## 2023-10-28 ENCOUNTER — Telehealth: Payer: Self-pay

## 2023-10-28 NOTE — Telephone Encounter (Signed)
 Left message on machine for patient to return our call

## 2023-10-28 NOTE — Telephone Encounter (Signed)
 Copied from CRM [id number]. Topic: General - Other >> Oct 25, 2023  9:27 AM Truddie Crumble wrote: Reason for CRM: patient called stating she does not feel like she is getting any better and no one has called her in regards to a message she left two days ago. I called the office and they stated MD Ardyth Harps does not work on Fridays and the nurse was not there today. The office told me that someone will call the patient back on Monday. I relayed the message to the patient and she stated am I suppose to suffer then.

## 2023-10-29 ENCOUNTER — Ambulatory Visit: Payer: Self-pay | Admitting: Internal Medicine

## 2023-10-29 NOTE — Telephone Encounter (Signed)
 Copied from CRM [id number]. Topic: Clinical - Red Word Triage >> Oct 29, 2023  8:04 AM Fonda Kinder J wrote: Red Word that prompted transfer to Nurse Triage: Vaginal burning  Chief Complaint: vaginal burning and discharge Symptoms: see above Frequency: three weeks Pertinent Negatives: Patient denies fever, pain, blood in urine Disposition: [] ED /[] Urgent Care (no appt availability in office) / [x] Appointment(In office/virtual)/ []  [facility]/ [] Home Care/ [] Refused Recommended Disposition /[] [facility]/ []  Follow-up with PCP Additional Notes: apt made for tomorrow; care advice given, denies questions, instructed to go to the ER if becomes worse.   Reason for Disposition  MODERATE-SEVERE itching (i.e., interferes with school, work, or sleep)  Answer Assessment - Initial Assessment Questions 1. SYMPTOM: "What's the main symptom you're concerned about?" (e.g., pain, itching, dryness)     Burning, itching 2. LOCATION: "Where is the  burning located?" (e.g., inside/outside, left/right)     Inside when she pees 3. ONSET: "When did the  burning  start?"     Three weeks ago 4. PAIN: "Is there any pain?" If Yes, ask: "How bad is it?" (Scale: 1-10; mild, moderate, severe)   -  MILD (1-3): Doesn't interfere with normal activities.    -  MODERATE (4-7): Interferes with normal activities (e.g., work or school) or awakens from sleep.     -  SEVERE (8-10): Excruciating pain, unable to do any normal activities.     Denies pain 5. ITCHING: "Is there any itching?" If Yes, ask: "How bad is it?" (Scale: 1-10; mild, moderate, severe)     It's the burning 6. CAUSE: "What do you think is causing the discharge?" "Have you had the same problem before? What happened then?"     Yes, UTI 7. OTHER SYMPTOMS: "Do you have any other symptoms?" (e.g., fever, itching, vaginal bleeding, pain with urination, injury to genital area, vaginal foreign body)     Itching, vaginal discharge.  8. PREGNANCY:  "Is there any chance you are pregnant?" "When was your last menstrual period?"     na  Protocols used: Vaginal Symptoms-A-AH

## 2023-10-30 ENCOUNTER — Encounter: Payer: Self-pay | Admitting: Family Medicine

## 2023-10-30 ENCOUNTER — Ambulatory Visit (INDEPENDENT_AMBULATORY_CARE_PROVIDER_SITE_OTHER): Payer: Medicare HMO | Admitting: Family Medicine

## 2023-10-30 ENCOUNTER — Other Ambulatory Visit: Payer: Self-pay | Admitting: Internal Medicine

## 2023-10-30 VITALS — BP 120/80 | HR 82 | Resp 16 | Ht 63.0 in | Wt 161.2 lb

## 2023-10-30 DIAGNOSIS — M546 Pain in thoracic spine: Secondary | ICD-10-CM

## 2023-10-30 DIAGNOSIS — N760 Acute vaginitis: Secondary | ICD-10-CM | POA: Diagnosis not present

## 2023-10-30 DIAGNOSIS — R300 Dysuria: Secondary | ICD-10-CM

## 2023-10-30 LAB — POC URINALSYSI DIPSTICK (AUTOMATED)
Bilirubin, UA: NEGATIVE
Blood, UA: POSITIVE
Glucose, UA: NEGATIVE
Ketones, UA: NEGATIVE
Nitrite, UA: NEGATIVE
Protein, UA: POSITIVE — AB
Spec Grav, UA: 1.015 (ref 1.010–1.025)
Urobilinogen, UA: 0.2 U/dL
pH, UA: 6 (ref 5.0–8.0)

## 2023-10-30 MED ORDER — FLUCONAZOLE 150 MG PO TABS: 150.0000 mg | ORAL_TABLET | Freq: Once | ORAL | 0 refills | Status: AC

## 2023-10-30 MED ORDER — TERCONAZOLE 0.4 % VA CREA: 1.0000 | TOPICAL_CREAM | Freq: Every day | VAGINAL | 0 refills | Status: AC

## 2023-10-30 NOTE — Progress Notes (Signed)
 ACUTE VISIT Chief Complaint  Patient presents with   vaginal problem    Burning when urinating    HPI: Ms.[name] is a 83 y.o. female with a PMHx significant for HTN, allergic rhinitis, GERD, DM II, OA, prediabetes, left breast cancer, HLD, and chronic pain, who is here today complaining of vaginal burning.   Patient complains of burning, exacerbated by urination for 2-3 weeks, present around 10/14/23.  She also complains of increased urinary urgency. She says she is urinating a little less than usual overall.  States that she also has some constant "funny" feeling.  Dysuria  This is a new problem. The current episode started 1 to 4 weeks ago. The problem has been unchanged. The quality of the pain is described as burning. The pain is mild. There has been no fever. She is Not sexually active. There is No history of pyelonephritis. Associated symptoms include a discharge and urgency. Pertinent negatives include no flank pain, frequency, hematuria, hesitancy, nausea, sweats or vomiting. She has tried nothing for the symptoms.   Negative for gross hematuria or foam in urine.  She gets up twice per night to urinate, but says that is normal for her.  Also mentions she has had some chills and a little bit of vaginal discharge and pruritus that is new within the last month.  Not taking anything OTC for her symptoms.  She has had UTIs in the past.  Denies pelvic pain, vaginal bleeding, bowel habits changes, or changes in appetite, or fever.   Lab Results  Component Value Date   NA 139 10/14/2023   CL 103 10/14/2023   K 3.9 10/14/2023   CO2 27 10/14/2023   BUN 11 10/14/2023   CREATININE 0.77 10/14/2023   GFR 71.28 10/14/2023   CALCIUM 9.1 10/14/2023   ALBUMIN 3.9 10/14/2023   GLUCOSE 126 (H) 10/14/2023   Patient also mentions she had a fall on ice in mid-January.  She has been following with [facility] and was told she had a ruptured disc which has been causing her lots of  pain.  She says she was in bed for a week when the injury initially. Since then, she is better, but still "unwell."   Review of Systems  Constitutional:  Positive for activity change and fatigue.  HENT:  Negative for mouth sores and sore throat.   Respiratory:  Negative for cough and shortness of breath.   Gastrointestinal:  Negative for abdominal pain, nausea and vomiting.  Genitourinary:  Positive for dysuria and urgency. Negative for flank pain, frequency, hematuria and hesitancy.  Musculoskeletal:  Positive for arthralgias, back pain and gait problem.  Neurological:  Negative for syncope and weakness.  Psychiatric/Behavioral:  Negative for confusion and hallucinations.   See other pertinent positives and negatives in HPI.  Current Outpatient Medications on File Prior to Visit  Medication Sig Dispense Refill   Accu-Chek FastClix Lancets MISC 1 each by Does not apply route daily. 100 each 12   ALPRAZolam (XANAX) 0.25 MG tablet Take 1 tablet (0.25 mg total) by mouth 2 (two) times daily as needed. for anxiety 60 tablet 2   amLODipine (NORVASC) 5 MG tablet Take 1 tablet (5 mg total) by mouth daily. 90 tablet 1   Artificial Tear Ointment (DRY EYES OP) Place 1 drop into both eyes at bedtime as needed (Dry eye).     Blood Glucose Monitoring Suppl (ACCU-CHEK GUIDE ME) w/Device KIT 1 each by Does not apply route daily. 1 kit 2  diclofenac (VOLTAREN) 75 MG EC tablet Take 1 tablet twice a day by oral route with meal(s).     diclofenac Sodium (VOLTAREN) 1 % GEL Apply 4 g topically 4 (four) times daily. Apply to bilateral knees.     diphenhydramine-acetaminophen (TYLENOL PM) 25-500 MG TABS tablet Take 2 tablets by mouth at bedtime.     fluticasone (CUTIVATE) 0.005 % ointment Apply 1 application topically daily.     glucose blood (ACCU-CHEK GUIDE) test strip 1 each by Other route daily. Use as instructed 100 each 12   HYDROcodone-acetaminophen (NORCO/VICODIN) 5-325 MG tablet Take 1 tablet by mouth  every 6 (six) hours as needed for moderate pain (pain score 4-6). 90 tablet 0   HYDROcodone-acetaminophen (NORCO/VICODIN) 5-325 MG tablet Take 1 tablet by mouth 3 (three) times daily. 90 tablet 0   HYDROcodone-acetaminophen (NORCO/VICODIN) 5-325 MG tablet TAKE ONE TABLET BY MOUTH THREE TIMES DAILY AS NEEDED MODERATE OR FOR SEVERE PAIN 90 tablet 0   ketoconazole (NIZORAL) 2 % shampoo Apply 1 application topically 3 (three) times a week.     lisinopril (ZESTRIL) 5 MG tablet TAKE ONE TABLET BY MOUTH IN THE MORNING AND TAKE ONE TABLET AT BEDTIME 180 tablet 1   meloxicam (MOBIC) 15 MG tablet Take 1 tablet (15 mg total) by mouth daily. 90 tablet 0   methocarbamol (ROBAXIN) 500 MG tablet Take 1 tablet every 6-8 hours by oral route as needed for spasm for 10 days.     mometasone (NASONEX) 50 MCG/ACT nasal spray Place 2 sprays into the nose at bedtime. 17 g 6   oxybutynin (DITROPAN-XL) 10 MG 24 hr tablet Take 1 tablet (10 mg total) by mouth daily. 90 tablet 1   raNITIdine HCl (ZANTAC PO) Take 4 mg by mouth at bedtime.     Sennosides (EX-LAX PO) Take 1 tablet by mouth at bedtime.     simvastatin (ZOCOR) 20 MG tablet Take 1 tablet (20 mg total) by mouth at bedtime. 90 tablet 1   tamoxifen (NOLVADEX) 20 MG tablet TAKE ONE TABLET BY MOUTH DAILY 90 tablet 3   triamcinolone (NASACORT) 55 MCG/ACT AERO nasal inhaler Place 2 sprays into the nose daily.     triamcinolone cream (KENALOG) 0.1 % APPLY TO RASH SPARINGLY ONCE DAILY 30 g 0   No current facility-administered medications on file prior to visit.    Past Medical History:  Diagnosis Date   Allergy    Anxiety    Arthritis    PAIN AND OA RIGHT KNEE; PAIN AND DDD AND SPINAL STENOSIS   Complication of anesthesia    PULLED OUT MY IV'S AND FOLEY - HALLUCINATIONS WITH HIATAL HERNIA SURGERY   Depression    Diabetes mellitus without complication (HCC)    Diverticulosis    PER COLONOSCOPY   GERD (gastroesophageal reflux disease)    OVER THE COUNTER MEDS IF  NEEDED   Heart murmur    "VERY SLIGHT ALL MY LIFE"   Hyperlipidemia    Hypertension    Sleep apnea    does not tolerate CPAP - HAS NOT USED IN OVER 2 YRS   Allergies  Allergen Reactions   Ambien [Zolpidem Tartrate]     amnesia   Hydrochlorothiazide     Other reaction(s): Lethargy (intolerance)   Prednisone     Rapid heart rate   Thiazide-Type Diuretics Other (See Comments)    "could not physically move"   Sulfamethoxazole Rash    Social History   Socioeconomic History   Marital status: Married  Spouse name: Not on file   Number of children: 2   Years of education: Not on file   Highest education level: Some college, no degree  Occupational History   Not on file  Tobacco Use   Smoking status: Never   Smokeless tobacco: Never  Vaping Use   Vaping status: Never Used  Substance and Sexual Activity   Alcohol use: No   Drug use: No   Sexual activity: Yes    Birth control/protection: Post-menopausal  Other Topics Concern   Not on file  Social History Narrative   Not on file   Social Drivers of Health   Financial Resource Strain: Low Risk  (02/20/2022)   Overall Financial Resource Strain (CARDIA)    Difficulty of Paying Living Expenses: Not very hard  Food Insecurity: No Food Insecurity (02/20/2022)   Hunger Vital Sign    Worried About Running Out of Food in the Last Year: Never true    Ran Out of Food in the Last Year: Never true  Transportation Needs: No Transportation Needs (02/20/2022)   PRAPARE - Administrator, Civil Service (Medical): No    Lack of Transportation (Non-Medical): No  Physical Activity: Insufficiently Active (02/20/2022)   Exercise Vital Sign    Days of Exercise per Week: 1 day    Minutes of Exercise per Session: 20 min  Stress: No Stress Concern Present (02/20/2022)   [employer] of Occupational Health - Occupational Stress Questionnaire    Feeling of Stress : Not at all  Social Connections: Moderately Integrated  (02/20/2022)   Social Connection and Isolation Panel [NHANES]    Frequency of Communication with Friends and Family: More than three times a week    Frequency of Social Gatherings with Friends and Family: Twice a week    Attends Religious Services: More than 4 times per year    Active Member of [employer] or Organizations: Yes    Attends Banker Meetings: 1 to 4 times per year    Marital Status: Widowed    Vitals:   10/30/23 1536  BP: 120/80  Pulse: 82  Resp: 16  SpO2: 98%   Body mass index is 28.56 kg/m.  Physical Exam Vitals and nursing note reviewed. Exam conducted with a chaperone present.  Constitutional:      General: She is not in acute distress.    Appearance: She is well-developed.  HENT:     Head: Atraumatic.  Eyes:     Conjunctiva/sclera: Conjunctivae normal.  Cardiovascular:     Rate and Rhythm: Normal rate and regular rhythm.  Pulmonary:     Effort: Pulmonary effort is normal. No respiratory distress.     Breath sounds: Normal breath sounds.  Abdominal:     Palpations: Abdomen is soft. There is no mass.     Tenderness: There is no abdominal tenderness. There is no right CVA tenderness or left CVA tenderness.  Genitourinary:    Exam position: Supine.     Labia:        Right: No rash, tenderness or lesion.        Left: No rash, tenderness or lesion.      Vagina: Erythema present. No vaginal discharge, tenderness, bleeding or lesions.     Comments: Vulvar and vaginal introitus erythema, no edema or lesions. No vaginal discharge. Skin:    General: Skin is warm.     Findings: No erythema.  Neurological:     Mental Status: She is alert and oriented to  person, place, and time.     Comments: Antalgic, unstable gait assisted with a cane.  Psychiatric:        Mood and Affect: Affect normal. Mood is anxious.    ASSESSMENT AND PLAN:  Ms. Dauenhauer was seen today for vaginal burning.   Dysuria We discussed possible etiologies, she is having some  discomfort with and without urination. Urine dipstick with some abnormalities (blood and protein positive, 2+ leukocytes), which could be contamination. She agrees on waiting for urine culture results before starting empiric antibiotic treatment. Will treat empirically for yeast infection. She was clearly instructed about warning signs.  -     POCT Urinalysis Dipstick (Automated) -     Urine Culture; Future -     Urine Microscopic; Future  Vulvovaginitis Mild erythema noted on exam, no edema. Recommend fluconazole 150 mg x 1 and terconazole daily for 10 days. Monitor for new symptoms.  -     Fluconazole; Take 1 tablet (150 mg total) by mouth once for 1 dose.  Dispense: 1 tablet; Refill: 0 -     Terconazole; Place 1 applicator vaginally at bedtime for 10 days.  Dispense: 45 g; Refill: 0   Return if symptoms worsen or fail to improve.  I, Rolla Etienne Wierda, acting as a scribe for Honora Searson [place], MD., have documented all relevant documentation on the behalf of Erinne Gillentine [place], MD, as directed by  Ariyannah Pauling [place], MD while in the presence of Danee Soller [place], MD.   I, Deadrick Stidd [place], MD, have reviewed all documentation for this visit. The documentation on 10/30/23 for the exam, diagnosis, procedures, and orders are all accurate and complete.  Siomara Burkel G. [place], MD  [facility]. [place] office.

## 2023-10-30 NOTE — Patient Instructions (Addendum)
 A few things to remember from today's visit:  Dysuria - Plan: POCT Urinalysis Dipstick (Automated), Urine Culture, Urine Microscopic, Urine Microscopic, Urine Culture  Vulvovaginitis - Plan: fluconazole (DIFLUCAN) 150 MG tablet, terconazole (TERAZOL 7) 0.4 % vaginal cream  Hold on Simvastatin when taking Fluconazole.  If you need refills for medications you take chronically, please call your pharmacy. Do not use My Chart to request refills or for acute issues that need immediate attention. If you send a my chart message, it may take a few days to be addressed, specially if I am not in the office.  Please be sure medication list is accurate. If a new problem present, please set up appointment sooner than planned today.

## 2023-10-30 NOTE — Telephone Encounter (Signed)
 Patient is aware

## 2023-10-31 LAB — URINALYSIS, MICROSCOPIC ONLY

## 2023-11-01 LAB — URINE CULTURE
MICRO NUMBER:: 16135708
SPECIMEN QUALITY:: ADEQUATE

## 2023-11-04 ENCOUNTER — Other Ambulatory Visit: Payer: Self-pay

## 2023-11-04 MED ORDER — CEPHALEXIN 500 MG PO CAPS: 500.0000 mg | ORAL_CAPSULE | Freq: Three times a day (TID) | ORAL | 0 refills | Status: AC

## 2023-11-11 DIAGNOSIS — S22000A Wedge compression fracture of unspecified thoracic vertebra, initial encounter for closed fracture: Secondary | ICD-10-CM | POA: Diagnosis not present

## 2023-11-22 DIAGNOSIS — M1712 Unilateral primary osteoarthritis, left knee: Secondary | ICD-10-CM | POA: Diagnosis not present

## 2023-11-26 NOTE — Assessment & Plan Note (Addendum)
 invasive lobular carcinoma, stage IA, pT1b, cN0, ER+/PR+/HER2-, Grade 2  -found on screening mammogram. S/p left lumpectomy on 09/26/21 by Dr. Magnus Ivan showed: 1 cm invasive ductal carcinoma; low grade DCIS; ALH. Margins negative. -s/p ultra-hypofractionated radiation therapy under Dr. Basilio Cairo 2/27-11/03/21. -she started tamoxifen in late 11/2021, plan for 5 years.  -Continue breast cancer surveillance and tamoxifen, refilled - 3D diagnostic mammogram done 07/17/2023.  She has category C breast density with benign findings.  Recommendation is to repeat diagnostic mammogram in 1 year.  -follow up in 6 months and sooner if needed

## 2023-11-26 NOTE — Progress Notes (Unsigned)
 Patient Care Team: Philip Aspen, Limmie Patricia, MD as PCP - General (Internal Medicine) Abigail Miyamoto, MD as Consulting Physician (General Surgery) Malachy Mood, MD as Consulting Physician (Hematology) Lonie Peak, MD as Attending Physician (Radiation Oncology) Jethro Bolus, MD as Consulting Physician (Ophthalmology) Donnelly Angelica, RN as Oncology Nurse Navigator Pershing Proud, RN as Oncology Nurse Navigator  Clinic Day:  11/27/2023  Referring physician: Philip Aspen, Estel*  ASSESSMENT & PLAN:   Assessment & Plan: Malignant neoplasm of upper-outer quadrant of left breast in female, estrogen receptor positive (HCC)  invasive lobular carcinoma, stage IA, pT1b, cN0, ER+/PR+/HER2-, Grade 2  -found on screening mammogram. S/p left lumpectomy on 09/26/21 by Dr. Magnus Ivan showed: 1 cm invasive ductal carcinoma; low grade DCIS; ALH. Margins negative. -s/p ultra-hypofractionated radiation therapy under Dr. Basilio Cairo 2/27-11/03/21. -she started tamoxifen in late 11/2021, plan for 5 years.  - 3D diagnostic mammogram done 07/17/2023.  She has category C breast density with benign findings.  Recommendation is to repeat diagnostic mammogram in 1 year.  -Continue breast cancer surveillance and tamoxifen.  -follow up in 6 months and sooner if needed    Back pain due to recent fall Patient states she fell on the ice in February.  She is seeing orthopedic provider.  Was given muscle relaxer which seems to help.  She understands that physical therapy is the plan for near future.  Pain is slowly and gradually improving.  Plan:  Reviewed labs which were unremarkable. Reviewed 3D mammogram done 07/17/2023.  She has category C breast with benign findings.  New 3D diagnostic mammogram should be scheduled in 1 year (November 2024). She had DEXA scan 10/26/2022 which did show osteoporosis.  She is on tamoxifen to help build bone mass.  She takes regular calcium and vitamin D.  New DEXA scan to be scheduled  for February 2026. Continue tamoxifen daily.  Refill as needed. Recommend labs and follow-up in 6 months.  She can follow-up sooner if needed.  The patient understands the plans discussed today and is in agreement with them.  She knows to contact our office if she develops concerns prior to her next appointment.  I provided 25 minutes of face-to-face time during this encounter and > 50% was spent counseling as documented under my assessment and plan.    [name], NP  [facility] - A DEPT OF [facility] [address] [place] [address] Dept: [phone] Dept Fax: [phone]   No orders of the defined types were placed in this encounter.     CHIEF COMPLAINT:  CC: Left breast cancer, estrogen receptor positive of  Current Treatment: Tamoxifen started April 2023  INTERVAL HISTORY:  [name] is here today for repeat clinical assessment.  Was last seen on 05/29/2023 by Clayborn Heron, NP.  Has been tolerating tamoxifen well without hot flashes, breast concerns, or any new breast changes.  3D diagnostic mammogram done 07/17/2023.  She has category C breast density with benign findings.  Recommendation is to repeat diagnostic mammogram in 1 year.  DEXA scan done February 2024 showing osteoporosis.  Patient continues tamoxifen to help build bone mass and prevent recurrence or metastatic breast cancer.  She does report a fall on the ice in February 2025.  She did have an injury to lumbar spine.  She is following up with orthopedics.  She understands that physical therapy as part of her plan in near future.  She denies any breast  changes, new masses or lumps.  She denies problems with nipple inversion or discharge. She denies chest pain, chest pressure, or shortness of breath. She denies headaches or visual disturbances. She denies abdominal pain, nausea, vomiting, or changes in bowel or bladder habits.  She denies fevers or chills.  Her appetite is good. Her weight has decreased 3 pounds over last month .  I have reviewed the past medical history, past surgical history, social history and family history with the patient and they are unchanged from previous note.  ALLERGIES:  is allergic to [employer] tartrate], hydrochlorothiazide, prednisone, thiazide-type diuretics, and sulfamethoxazole.  MEDICATIONS:  Current Outpatient Medications  Medication Sig Dispense Refill   Accu-Chek FastClix Lancets MISC 1 each by Does not apply route daily. 100 each 12   ALPRAZolam (XANAX) 0.25 MG tablet Take 1 tablet (0.25 mg total) by mouth 2 (two) times daily as needed. for anxiety 60 tablet 2   amLODipine (NORVASC) 5 MG tablet Take 1 tablet (5 mg total) by mouth daily. 90 tablet 1   Artificial Tear Ointment (DRY EYES OP) Place 1 drop into both eyes at bedtime as needed (Dry eye).     Blood Glucose Monitoring Suppl (ACCU-CHEK GUIDE ME) w/Device KIT 1 each by Does not apply route daily. 1 kit 2   diclofenac (VOLTAREN) 75 MG EC tablet Take 1 tablet twice a day by oral route with meal(s).     diclofenac Sodium (VOLTAREN) 1 % GEL Apply 4 g topically 4 (four) times daily. Apply to bilateral knees.     diphenhydramine-acetaminophen (TYLENOL PM) 25-500 MG TABS tablet Take 2 tablets by mouth at bedtime.     fluticasone (CUTIVATE) 0.005 % ointment Apply 1 application topically daily.     glucose blood (ACCU-CHEK GUIDE) test strip 1 each by Other route daily. Use as instructed 100 each 12   HYDROcodone-acetaminophen (NORCO/VICODIN) 5-325 MG tablet Take 1 tablet by mouth every 6 (six) hours as needed for moderate pain (pain score 4-6). 90 tablet 0   HYDROcodone-acetaminophen (NORCO/VICODIN) 5-325 MG tablet Take 1 tablet by mouth 3 (three) times daily. 90 tablet 0   HYDROcodone-acetaminophen (NORCO/VICODIN) 5-325 MG tablet TAKE ONE TABLET BY MOUTH THREE TIMES DAILY AS NEEDED MODERATE OR FOR SEVERE PAIN 90 tablet 0   ketoconazole (NIZORAL) 2 %  shampoo Apply 1 application topically 3 (three) times a week.     lisinopril (ZESTRIL) 5 MG tablet TAKE ONE TABLET BY MOUTH IN THE MORNING AND TAKE ONE TABLET AT BEDTIME 180 tablet 1   meloxicam (MOBIC) 15 MG tablet Take 1 tablet (15 mg total) by mouth daily. 90 tablet 0   methocarbamol (ROBAXIN) 500 MG tablet Take 1 tablet every 6-8 hours by oral route as needed for spasm for 10 days.     methocarbamol (ROBAXIN) 500 MG tablet TAKE ONE TABLET BY MOUTH EVERY 6 TO 8 HOURS AS NEEDED FOR MUSCLE SPASMS 20 tablet 0   mometasone (NASONEX) 50 MCG/ACT nasal spray Place 2 sprays into the nose at bedtime. 17 g 6   oxybutynin (DITROPAN-XL) 10 MG 24 hr tablet Take 1 tablet (10 mg total) by mouth daily. 90 tablet 1   raNITIdine HCl (ZANTAC PO) Take 4 mg by mouth at bedtime.     Sennosides (EX-LAX PO) Take 1 tablet by mouth at bedtime.     simvastatin (ZOCOR) 20 MG tablet Take 1 tablet (20 mg total) by mouth at bedtime. 90 tablet 1   tamoxifen (NOLVADEX) 20 MG tablet TAKE ONE TABLET BY  MOUTH DAILY 90 tablet 3   triamcinolone (NASACORT) 55 MCG/ACT AERO nasal inhaler Place 2 sprays into the nose daily.     triamcinolone cream (KENALOG) 0.1 % APPLY TO RASH SPARINGLY ONCE DAILY 30 g 0   No current facility-administered medications for this visit.    HISTORY OF PRESENT ILLNESS:   Oncology History Overview Note   Cancer Staging  Malignant neoplasm of upper-outer quadrant of left breast in female, estrogen receptor positive (HCC) Staging form: Breast, AJCC 8th Edition - Clinical stage from 08/17/2021: Stage IA (cT1c, cN0, cM0, G2, ER+, PR+, HER2-) - Signed by Malachy Mood, MD on 09/18/2021    Malignant neoplasm of upper-outer quadrant of left breast in female, estrogen receptor positive (HCC)  08/09/2021 Mammogram   EXAM: DIGITAL DIAGNOSTIC UNILATERAL LEFT MAMMOGRAM WITH TOMOSYNTHESIS AND CAD; ULTRASOUND LEFT BREAST LIMITED  IMPRESSION: 1.  There is a suspicious mass in the left breast at 1 o'clock.   2.  No  evidence of left axillary lymphadenopathy.   08/17/2021 Cancer Staging   Staging form: Breast, AJCC 8th Edition - Clinical stage from 08/17/2021: Stage IA (cT1c, cN0, cM0, G2, ER+, PR+, HER2-) - Signed by Malachy Mood, MD on 09/18/2021 Stage prefix: Initial diagnosis Histologic grading system: 3 grade system   08/17/2021 Initial Biopsy   Diagnosis Breast, left, needle core biopsy, left breast 1:00, same - INVASIVE MAMMARY CARCINOMA, GRADE 1/2. - SEE MICROSCOPIC DESCRIPTION. Microscopic Comment The greatest tumor dimension is 1.1 cm .  Addendum: Immunohistochemistry for E-cadherin is negative consistent with lobular carcinoma.  PROGNOSTIC INDICATORS Results: The tumor cells are EQUIVOCAL for Her2 (2+). Her2 by FISH will be performed and results reported separately. Estrogen Receptor: 95%, POSITIVE, STRONG STAINING INTENSITY Progesterone Receptor: 100%, POSITIVE, STRONG STAINING INTENSITY Proliferation Marker Ki67: 5%  FLUORESCENCE IN-SITU HYBRIDIZATION Results: GROUP 5: HER2 **NEGATIVE**   09/18/2021 Initial Diagnosis   Malignant neoplasm of upper-outer quadrant of left breast in female, estrogen receptor positive (HCC)   09/26/2021 Cancer Staging   Staging form: Breast, AJCC 8th Edition - Pathologic stage from 09/26/2021: Stage IA (pT1b, pN0, cM0, G2, ER+, PR+, HER2-) - Signed by Malachy Mood, MD on 11/10/2021 Stage prefix: Initial diagnosis Histologic grading system: 3 grade system Residual tumor (R): R0 - None   09/26/2021 Definitive Surgery   FINAL MICROSCOPIC DIAGNOSIS:   A. BREAST, LEFT, LUMPECTOMY:  - Invasive ductal carcinoma, 1 cm, grade 2  - Ductal carcinoma in situ, low-grade  - Resection margins are negative for carcinoma - closest is the superior margin at 0.3 cm  - Atypical lobular hyperplasia  - Biopsy site changes  - See oncology table    10/30/2021 - 11/03/2021 Radiation Therapy         REVIEW OF SYSTEMS:   Constitutional: Denies fevers, chills or abnormal  weight loss Eyes: Denies blurriness of vision Ears, nose, mouth, throat, and face: Denies mucositis or sore throat Respiratory: Denies cough, dyspnea or wheezes Cardiovascular: Denies palpitation, chest discomfort or lower extremity swelling Gastrointestinal:  Denies nausea, heartburn or change in bowel habits Skin: Denies abnormal skin rashes Lymphatics: Denies new lymphadenopathy or easy bruising Neurological:Denies numbness, tingling or new weaknesses Behavioral/Psych: Mood is stable, no new changes  Musculoskeletal: Low back pain after fall on the ice in February 2025.  She is followed by orthopedics.  Has prescription for pain medication and muscle relaxer.  Medications do help relieve pain. All other systems were reviewed with the patient and are negative.   VITALS:   Today's Vitals   11/27/23  1102  BP: 138/70  Pulse: 70  Resp: (!) 21  Temp: 98.1 F (36.7 C)  TempSrc: Temporal  SpO2: 97%  Weight: 158 lb 11.2 oz (72 kg)  Height: 5\' 3"  (1.6 m)  PainSc: 2    Body mass index is 28.11 kg/m.   Wt Readings from Last 3 Encounters:  11/27/23 158 lb 11.2 oz (72 kg)  10/30/23 161 lb 4 oz (73.1 kg)  10/14/23 137 lb (62.1 kg)    Body mass index is 28.11 kg/m.  Performance status (ECOG): 1 - Symptomatic but completely ambulatory  PHYSICAL EXAM:   GENERAL:alert, no distress and comfortable SKIN: skin color, texture, turgor are normal, no rashes or significant lesions EYES: normal, Conjunctiva are pink and non-injected, sclera clear OROPHARYNX:no exudate, no erythema and lips, buccal mucosa, and tongue normal  NECK: supple, thyroid normal size, non-tender, without nodularity LYMPH:  no palpable lymphadenopathy in the cervical, axillary or inguinal LUNGS: clear to auscultation and percussion with normal breathing effort HEART: regular rate & rhythm and no murmurs and no lower extremity edema ABDOMEN:abdomen soft, non-tender and normal bowel sounds Musculoskeletal:no cyanosis  of digits and no clubbing. NEURO: alert & oriented x 3 with fluent speech, no focal motor/sensory deficits BREAST: The right breast is without palpable mass or lumps.  There is some fibrous tissue along inferior aspect of the breast.  This is nontender and not fixed.  Nipple inversion or nipple discharge.  There is no axillary lymphadenopathy on the right.  Left breast exhibits postlumpectomy changes with well-healed surgical scars.  There are no palpable masses or lumps.  No nipple inversion or discharge.  There is no axillary lymphadenopathy on the left.  LABORATORY DATA:  I have reviewed the data as listed    Component Value Date/Time   NA 139 11/27/2023 1036   K 4.0 11/27/2023 1036   CL 104 11/27/2023 1036   CO2 29 11/27/2023 1036   GLUCOSE 141 (H) 11/27/2023 1036   BUN 13 11/27/2023 1036   CREATININE 0.75 11/27/2023 1036   CALCIUM 9.1 11/27/2023 1036   PROT 6.7 11/27/2023 1036   ALBUMIN 4.0 11/27/2023 1036   AST 11 (L) 11/27/2023 1036   ALT 7 11/27/2023 1036   ALKPHOS 38 11/27/2023 1036   BILITOT 0.6 11/27/2023 1036   GFRNONAA >60 11/27/2023 1036   GFRAA >90 07/03/2014 0400

## 2023-11-27 ENCOUNTER — Inpatient Hospital Stay: Payer: Medicare HMO | Attending: Nurse Practitioner

## 2023-11-27 ENCOUNTER — Inpatient Hospital Stay: Payer: Medicare HMO | Admitting: Nurse Practitioner

## 2023-11-27 ENCOUNTER — Encounter: Payer: Self-pay | Admitting: Nurse Practitioner

## 2023-11-27 VITALS — BP 138/70 | HR 70 | Temp 98.1°F | Resp 21 | Ht 63.0 in | Wt 158.7 lb

## 2023-11-27 DIAGNOSIS — Z1721 Progesterone receptor positive status: Secondary | ICD-10-CM | POA: Insufficient documentation

## 2023-11-27 DIAGNOSIS — Z170 Estrogen receptor positive status [ER+]: Secondary | ICD-10-CM | POA: Diagnosis not present

## 2023-11-27 DIAGNOSIS — Z79810 Long term (current) use of selective estrogen receptor modulators (SERMs): Secondary | ICD-10-CM | POA: Diagnosis not present

## 2023-11-27 DIAGNOSIS — Z9181 History of falling: Secondary | ICD-10-CM | POA: Diagnosis not present

## 2023-11-27 DIAGNOSIS — Z79899 Other long term (current) drug therapy: Secondary | ICD-10-CM | POA: Diagnosis not present

## 2023-11-27 DIAGNOSIS — Z882 Allergy status to sulfonamides status: Secondary | ICD-10-CM | POA: Diagnosis not present

## 2023-11-27 DIAGNOSIS — Z1732 Human epidermal growth factor receptor 2 negative status: Secondary | ICD-10-CM | POA: Diagnosis not present

## 2023-11-27 DIAGNOSIS — M549 Dorsalgia, unspecified: Secondary | ICD-10-CM | POA: Diagnosis not present

## 2023-11-27 DIAGNOSIS — C50919 Malignant neoplasm of unspecified site of unspecified female breast: Secondary | ICD-10-CM

## 2023-11-27 DIAGNOSIS — C50412 Malignant neoplasm of upper-outer quadrant of left female breast: Secondary | ICD-10-CM | POA: Diagnosis not present

## 2023-11-27 DIAGNOSIS — Z888 Allergy status to other drugs, medicaments and biological substances status: Secondary | ICD-10-CM | POA: Insufficient documentation

## 2023-11-27 LAB — CMP (CANCER CENTER ONLY)
ALT: 7 U/L (ref 0–44)
AST: 11 U/L — ABNORMAL LOW (ref 15–41)
Albumin: 4 g/dL (ref 3.5–5.0)
Alkaline Phosphatase: 38 U/L (ref 38–126)
Anion gap: 6 (ref 5–15)
BUN: 13 mg/dL (ref 8–23)
CO2: 29 mmol/L (ref 22–32)
Calcium: 9.1 mg/dL (ref 8.9–10.3)
Chloride: 104 mmol/L (ref 98–111)
Creatinine: 0.75 mg/dL (ref 0.44–1.00)
GFR, Estimated: >60 mL/min (ref >60)
Glucose, Bld: 141 mg/dL — ABNORMAL HIGH (ref 70–99)
Potassium: 4 mmol/L (ref 3.5–5.1)
Sodium: 139 mmol/L (ref 135–145)
Total Bilirubin: 0.6 mg/dL (ref 0.0–1.2)
Total Protein: 6.7 g/dL (ref 6.5–8.1)

## 2023-11-27 LAB — CBC WITH DIFFERENTIAL (CANCER CENTER ONLY)
Abs Immature Granulocytes: 0.1 10*3/uL — ABNORMAL HIGH (ref 0.00–0.07)
Basophils Absolute: 0.1 10*3/uL (ref 0.0–0.1)
Basophils Relative: 1 %
Eosinophils Absolute: 0.2 10*3/uL (ref 0.0–0.5)
Eosinophils Relative: 3 %
HCT: 38.6 % (ref 36.0–46.0)
Hemoglobin: 12.7 g/dL (ref 12.0–15.0)
Immature Granulocytes: 1 %
Lymphocytes Relative: 35 %
Lymphs Abs: 2.5 10*3/uL (ref 0.7–4.0)
MCH: 31.8 pg (ref 26.0–34.0)
MCHC: 32.9 g/dL (ref 30.0–36.0)
MCV: 96.7 fL (ref 80.0–100.0)
Monocytes Absolute: 0.6 10*3/uL (ref 0.1–1.0)
Monocytes Relative: 9 %
Neutro Abs: 3.5 10*3/uL (ref 1.7–7.7)
Neutrophils Relative %: 51 %
Platelet Count: 234 10*3/uL (ref 150–400)
RBC: 3.99 MIL/uL (ref 3.87–5.11)
RDW: 12.5 % (ref 11.5–15.5)
WBC Count: 6.9 10*3/uL (ref 4.0–10.5)
nRBC: 0 % (ref 0.0–0.2)

## 2023-12-12 DIAGNOSIS — Z96651 Presence of right artificial knee joint: Secondary | ICD-10-CM | POA: Diagnosis not present

## 2023-12-12 DIAGNOSIS — M25561 Pain in right knee: Secondary | ICD-10-CM | POA: Diagnosis not present

## 2023-12-23 ENCOUNTER — Telehealth: Payer: Self-pay | Admitting: Internal Medicine

## 2023-12-23 NOTE — Telephone Encounter (Signed)
 Pt said she received a call last week

## 2023-12-24 NOTE — Telephone Encounter (Signed)
 I have not called the patient.

## 2024-01-01 DIAGNOSIS — H04123 Dry eye syndrome of bilateral lacrimal glands: Secondary | ICD-10-CM | POA: Diagnosis not present

## 2024-01-01 DIAGNOSIS — E119 Type 2 diabetes mellitus without complications: Secondary | ICD-10-CM | POA: Diagnosis not present

## 2024-01-01 DIAGNOSIS — H26493 Other secondary cataract, bilateral: Secondary | ICD-10-CM | POA: Diagnosis not present

## 2024-01-01 LAB — HM DIABETES EYE EXAM

## 2024-01-03 ENCOUNTER — Other Ambulatory Visit: Payer: Self-pay | Admitting: Internal Medicine

## 2024-01-03 DIAGNOSIS — G894 Chronic pain syndrome: Secondary | ICD-10-CM

## 2024-01-08 ENCOUNTER — Encounter (HOSPITAL_COMMUNITY): Payer: Self-pay

## 2024-01-15 ENCOUNTER — Ambulatory Visit (INDEPENDENT_AMBULATORY_CARE_PROVIDER_SITE_OTHER): Payer: Medicare HMO | Admitting: Internal Medicine

## 2024-01-15 ENCOUNTER — Encounter: Payer: Self-pay | Admitting: Internal Medicine

## 2024-01-15 VITALS — BP 160/98 | HR 85 | Temp 98.3°F | Wt 159.0 lb

## 2024-01-15 DIAGNOSIS — Z170 Estrogen receptor positive status [ER+]: Secondary | ICD-10-CM

## 2024-01-15 DIAGNOSIS — E782 Mixed hyperlipidemia: Secondary | ICD-10-CM

## 2024-01-15 DIAGNOSIS — E1139 Type 2 diabetes mellitus with other diabetic ophthalmic complication: Secondary | ICD-10-CM

## 2024-01-15 DIAGNOSIS — I10 Essential (primary) hypertension: Secondary | ICD-10-CM | POA: Diagnosis not present

## 2024-01-15 DIAGNOSIS — C50412 Malignant neoplasm of upper-outer quadrant of left female breast: Secondary | ICD-10-CM

## 2024-01-15 DIAGNOSIS — E785 Hyperlipidemia, unspecified: Secondary | ICD-10-CM

## 2024-01-15 LAB — POCT GLYCOSYLATED HEMOGLOBIN (HGB A1C): Hemoglobin A1C: 6.3 % — AB (ref 4.0–5.6)

## 2024-01-15 MED ORDER — LISINOPRIL 5 MG PO TABS: ORAL_TABLET | ORAL | 1 refills | Status: DC

## 2024-01-15 MED ORDER — AMLODIPINE BESYLATE 5 MG PO TABS
5.0000 mg | ORAL_TABLET | Freq: Every day | ORAL | 1 refills | Status: DC
Start: 2024-01-15 — End: 2024-07-22

## 2024-01-15 MED ORDER — SIMVASTATIN 20 MG PO TABS: 20.0000 mg | ORAL_TABLET | Freq: Every day | ORAL | 1 refills | Status: DC

## 2024-01-15 NOTE — Assessment & Plan Note (Signed)
 Blood pressure is higher than usual, she has not skipped any doses of medication.  She will do ambulatory blood pressure monitoring and return in 3 months for follow-up.  If still elevated we will consider augmenting antihypertensive therapy.

## 2024-01-15 NOTE — Assessment & Plan Note (Signed)
Well-controlled with an A1c of 6.3.

## 2024-01-15 NOTE — Progress Notes (Signed)
 Established Patient Office Visit     CC/Reason for Visit: Follow-up chronic conditions  HPI: [name] is a 83 y.o. female who is coming in today for the above mentioned reasons. Past Medical History is significant for: Hypertension, hyperlipidemia, type 2 diabetes, history of breast cancer, chronic pain syndrome, OSA, generalized anxiety disorder.  She is doing well, no acute concerns or complaints.   Past Medical/Surgical History: Past Medical History:  Diagnosis Date   Allergy    Anxiety    Arthritis    PAIN AND OA RIGHT KNEE; PAIN AND DDD AND SPINAL STENOSIS   Complication of anesthesia    PULLED OUT MY IV'S AND FOLEY - HALLUCINATIONS WITH HIATAL HERNIA SURGERY   Depression    Diabetes mellitus without complication (HCC)    Diverticulosis    PER COLONOSCOPY   GERD (gastroesophageal reflux disease)    OVER THE COUNTER MEDS IF NEEDED   Heart murmur    "VERY SLIGHT ALL MY LIFE"   Hyperlipidemia    Hypertension    Sleep apnea    does not tolerate CPAP - HAS NOT USED IN OVER 2 YRS    Past Surgical History:  Procedure Laterality Date   BACK SURGERY     BREAST LUMPECTOMY WITH RADIOACTIVE SEED LOCALIZATION Left 09/26/2021   Procedure: LEFT BREAST LUMPECTOMY WITH RADIOACTIVE SEED LOCALIZATION;  Surgeon: Oza Blumenthal, MD;  Location: [facility] OR;  Service: General;  Laterality: Left;   COLONOSCOPY     EYE SURGERY     bilateral cataracts   HIATAL HERNIA REPAIR  09/03/2006   LUMBAR FUSION     LUMBAR LAMINECTOMY     PARTIAL KNEE ARTHROPLASTY Right 07/02/2014   Procedure: RIGHT MEDIAL UNICOMPARTMENTAL KNEE;  Surgeon: Bevin Bucks, MD;  Location: [facility];  Service: Orthopedics;  Laterality: Right;   UPPER GASTROINTESTINAL ENDOSCOPY      Social History:  reports that she has never smoked. She has never used smokeless tobacco. She reports that she does not drink alcohol  and does not use drugs.  Allergies: Allergies  Allergen Reactions   Ambien  [Zolpidem   Tartrate]     amnesia   Hydrochlorothiazide      Other reaction(s): Lethargy (intolerance)   Prednisone     Rapid heart rate   Thiazide-Type Diuretics Other (See Comments)    "could not physically move"   Sulfamethoxazole Rash    Family History:  Family History  Problem Relation Age of Onset   Heart disease Mother    Breast cancer Mother    Heart disease Father    Heart disease Sister    Pulmonary embolism Sister    Diabetes Paternal Grandfather      Current Outpatient Medications:    Accu-Chek FastClix Lancets MISC, 1 each by Does not apply route daily., Disp: 100 each, Rfl: 12   ALPRAZolam  (XANAX ) 0.25 MG tablet, Take 1 tablet (0.25 mg total) by mouth 2 (two) times daily as needed. for anxiety, Disp: 60 tablet, Rfl: 2   amLODipine  (NORVASC ) 5 MG tablet, Take 1 tablet (5 mg total) by mouth daily., Disp: 90 tablet, Rfl: 1   Artificial Tear Ointment (DRY EYES OP), Place 1 drop into both eyes at bedtime as needed (Dry eye)., Disp: , Rfl:    Blood Glucose Monitoring Suppl (ACCU-CHEK GUIDE ME) w/Device KIT, 1 each by Does not apply route daily., Disp: 1 kit, Rfl: 2   diclofenac (VOLTAREN) 75 MG EC tablet, Take 1 tablet twice a day by oral route with  meal(s)., Disp: , Rfl:    diclofenac Sodium (VOLTAREN) 1 % GEL, Apply 4 g topically 4 (four) times daily. Apply to bilateral knees., Disp: , Rfl:    diphenhydramine -acetaminophen  (TYLENOL  PM) 25-500 MG TABS tablet, Take 2 tablets by mouth at bedtime., Disp: , Rfl:    fluticasone  (CUTIVATE ) 0.005 % ointment, Apply 1 application topically daily., Disp: , Rfl:    glucose blood (ACCU-CHEK GUIDE) test strip, 1 each by Other route daily. Use as instructed, Disp: 100 each, Rfl: 12   HYDROcodone -acetaminophen  (NORCO/VICODIN) 5-325 MG tablet, Take 1 tablet by mouth every 6 (six) hours as needed for moderate pain (pain score 4-6)., Disp: 90 tablet, Rfl: 0   HYDROcodone -acetaminophen  (NORCO/VICODIN) 5-325 MG tablet, Take 1 tablet by mouth 3 (three)  times daily., Disp: 90 tablet, Rfl: 0   HYDROcodone -acetaminophen  (NORCO/VICODIN) 5-325 MG tablet, TAKE ONE TABLET BY MOUTH THREE TIMES DAILY AS NEEDED MODERATE OR FOR SEVERE PAIN, Disp: 90 tablet, Rfl: 0   ketoconazole (NIZORAL) 2 % shampoo, Apply 1 application topically 3 (three) times a week., Disp: , Rfl:    lisinopril  (ZESTRIL ) 5 MG tablet, TAKE ONE TABLET BY MOUTH IN THE MORNING AND TAKE ONE TABLET AT BEDTIME, Disp: 180 tablet, Rfl: 1   meloxicam  (MOBIC ) 15 MG tablet, TAKE ONE TABLET BY MOUTH DAILY, Disp: 90 tablet, Rfl: 0   methocarbamol  (ROBAXIN ) 500 MG tablet, Take 1 tablet every 6-8 hours by oral route as needed for spasm for 10 days., Disp: , Rfl:    methocarbamol  (ROBAXIN ) 500 MG tablet, TAKE ONE TABLET BY MOUTH EVERY 6 TO 8 HOURS AS NEEDED FOR MUSCLE SPASMS, Disp: 20 tablet, Rfl: 0   mometasone  (NASONEX ) 50 MCG/ACT nasal spray, Place 2 sprays into the nose at bedtime., Disp: 17 g, Rfl: 6   oxybutynin  (DITROPAN -XL) 10 MG 24 hr tablet, Take 1 tablet (10 mg total) by mouth daily., Disp: 90 tablet, Rfl: 1   raNITIdine  HCl (ZANTAC  PO), Take 4 mg by mouth at bedtime., Disp: , Rfl:    Sennosides (EX-LAX PO), Take 1 tablet by mouth at bedtime., Disp: , Rfl:    simvastatin  (ZOCOR ) 20 MG tablet, Take 1 tablet (20 mg total) by mouth at bedtime., Disp: 90 tablet, Rfl: 1   tamoxifen  (NOLVADEX ) 20 MG tablet, TAKE ONE TABLET BY MOUTH DAILY, Disp: 90 tablet, Rfl: 3   triamcinolone  (NASACORT ) 55 MCG/ACT AERO nasal inhaler, Place 2 sprays into the nose daily., Disp: , Rfl:    triamcinolone  cream (KENALOG ) 0.1 %, APPLY TO RASH SPARINGLY ONCE DAILY, Disp: 30 g, Rfl: 0  Review of Systems:  Negative unless indicated in HPI.   Physical Exam: Vitals:   01/15/24 1254 01/15/24 1257  BP: (!) 140/88 (!) 160/98  Pulse: 85   Temp: 98.3 F (36.8 C)   TempSrc: Oral   SpO2: 96%   Weight: 159 lb (72.1 kg)     Body mass index is 28.17 kg/m.   Physical Exam Vitals reviewed.  Constitutional:       Appearance: Normal appearance.  HENT:     Head: Normocephalic and atraumatic.  Eyes:     Conjunctiva/sclera: Conjunctivae normal.  Cardiovascular:     Rate and Rhythm: Normal rate and regular rhythm.  Pulmonary:     Effort: Pulmonary effort is normal.     Breath sounds: Normal breath sounds.  Skin:    General: Skin is warm and dry.  Neurological:     General: No focal deficit present.     Mental Status: She is  alert and oriented to person, place, and time.  Psychiatric:        Mood and Affect: Mood normal.        Behavior: Behavior normal.        Thought Content: Thought content normal.        Judgment: Judgment normal.      Impression and Plan:  Type 2 diabetes mellitus with other ophthalmic complication, without long-term current use of insulin (HCC) Assessment & Plan: Well-controlled with an A1c of 6.3.  Orders: -     POCT glycosylated hemoglobin (Hb A1C)  Essential hypertension Assessment & Plan: Blood pressure is higher than usual, she has not skipped any doses of medication.  She will do ambulatory blood pressure monitoring and return in 3 months for follow-up.  If still elevated we will consider augmenting antihypertensive therapy.   Mixed hyperlipidemia Assessment & Plan: Well-controlled with a recent LDL of 48 on simvastatin  20 mg daily.   Malignant neoplasm of upper-outer quadrant of left breast in female, estrogen receptor positive (HCC)     Time spent:31 minutes reviewing chart, interviewing and examining patient and formulating plan of care.     Marguerita Shih, MD [facility]

## 2024-01-15 NOTE — Addendum Note (Signed)
 Addended by: Nicolina Barrios B on: 01/15/2024 03:49 PM   Modules accepted: Orders

## 2024-01-15 NOTE — Assessment & Plan Note (Signed)
 Well-controlled with a recent LDL of 48 on simvastatin  20 mg daily.

## 2024-01-17 DIAGNOSIS — R296 Repeated falls: Secondary | ICD-10-CM | POA: Diagnosis not present

## 2024-01-17 DIAGNOSIS — M25569 Pain in unspecified knee: Secondary | ICD-10-CM | POA: Diagnosis not present

## 2024-01-17 DIAGNOSIS — M5459 Other low back pain: Secondary | ICD-10-CM | POA: Diagnosis not present

## 2024-01-17 DIAGNOSIS — M6281 Muscle weakness (generalized): Secondary | ICD-10-CM | POA: Diagnosis not present

## 2024-01-20 DIAGNOSIS — M5459 Other low back pain: Secondary | ICD-10-CM | POA: Diagnosis not present

## 2024-01-20 DIAGNOSIS — M25569 Pain in unspecified knee: Secondary | ICD-10-CM | POA: Diagnosis not present

## 2024-01-20 DIAGNOSIS — R296 Repeated falls: Secondary | ICD-10-CM | POA: Diagnosis not present

## 2024-01-20 DIAGNOSIS — M6281 Muscle weakness (generalized): Secondary | ICD-10-CM | POA: Diagnosis not present

## 2024-01-29 DIAGNOSIS — M6281 Muscle weakness (generalized): Secondary | ICD-10-CM | POA: Diagnosis not present

## 2024-01-29 DIAGNOSIS — R296 Repeated falls: Secondary | ICD-10-CM | POA: Diagnosis not present

## 2024-01-29 DIAGNOSIS — M25569 Pain in unspecified knee: Secondary | ICD-10-CM | POA: Diagnosis not present

## 2024-01-29 DIAGNOSIS — M5459 Other low back pain: Secondary | ICD-10-CM | POA: Diagnosis not present

## 2024-02-05 ENCOUNTER — Other Ambulatory Visit: Payer: Self-pay | Admitting: Internal Medicine

## 2024-02-05 DIAGNOSIS — M5459 Other low back pain: Secondary | ICD-10-CM | POA: Diagnosis not present

## 2024-02-05 DIAGNOSIS — M6281 Muscle weakness (generalized): Secondary | ICD-10-CM | POA: Diagnosis not present

## 2024-02-05 DIAGNOSIS — M25569 Pain in unspecified knee: Secondary | ICD-10-CM | POA: Diagnosis not present

## 2024-02-05 DIAGNOSIS — R296 Repeated falls: Secondary | ICD-10-CM | POA: Diagnosis not present

## 2024-02-05 DIAGNOSIS — F411 Generalized anxiety disorder: Secondary | ICD-10-CM

## 2024-02-07 DIAGNOSIS — M25569 Pain in unspecified knee: Secondary | ICD-10-CM | POA: Diagnosis not present

## 2024-02-07 DIAGNOSIS — M5459 Other low back pain: Secondary | ICD-10-CM | POA: Diagnosis not present

## 2024-02-07 DIAGNOSIS — M6281 Muscle weakness (generalized): Secondary | ICD-10-CM | POA: Diagnosis not present

## 2024-02-07 DIAGNOSIS — R296 Repeated falls: Secondary | ICD-10-CM | POA: Diagnosis not present

## 2024-02-12 DIAGNOSIS — R296 Repeated falls: Secondary | ICD-10-CM | POA: Diagnosis not present

## 2024-02-12 DIAGNOSIS — M5459 Other low back pain: Secondary | ICD-10-CM | POA: Diagnosis not present

## 2024-02-12 DIAGNOSIS — M1712 Unilateral primary osteoarthritis, left knee: Secondary | ICD-10-CM | POA: Diagnosis not present

## 2024-02-12 DIAGNOSIS — M6281 Muscle weakness (generalized): Secondary | ICD-10-CM | POA: Diagnosis not present

## 2024-02-12 DIAGNOSIS — M25569 Pain in unspecified knee: Secondary | ICD-10-CM | POA: Diagnosis not present

## 2024-02-20 DIAGNOSIS — R296 Repeated falls: Secondary | ICD-10-CM | POA: Diagnosis not present

## 2024-02-20 DIAGNOSIS — M25569 Pain in unspecified knee: Secondary | ICD-10-CM | POA: Diagnosis not present

## 2024-02-20 DIAGNOSIS — M6281 Muscle weakness (generalized): Secondary | ICD-10-CM | POA: Diagnosis not present

## 2024-02-20 DIAGNOSIS — M5459 Other low back pain: Secondary | ICD-10-CM | POA: Diagnosis not present

## 2024-02-21 DIAGNOSIS — M5459 Other low back pain: Secondary | ICD-10-CM | POA: Diagnosis not present

## 2024-02-21 DIAGNOSIS — R296 Repeated falls: Secondary | ICD-10-CM | POA: Diagnosis not present

## 2024-02-21 DIAGNOSIS — M6281 Muscle weakness (generalized): Secondary | ICD-10-CM | POA: Diagnosis not present

## 2024-02-21 DIAGNOSIS — M25569 Pain in unspecified knee: Secondary | ICD-10-CM | POA: Diagnosis not present

## 2024-02-24 DIAGNOSIS — M6281 Muscle weakness (generalized): Secondary | ICD-10-CM | POA: Diagnosis not present

## 2024-02-24 DIAGNOSIS — M5459 Other low back pain: Secondary | ICD-10-CM | POA: Diagnosis not present

## 2024-02-24 DIAGNOSIS — M25569 Pain in unspecified knee: Secondary | ICD-10-CM | POA: Diagnosis not present

## 2024-02-24 DIAGNOSIS — R296 Repeated falls: Secondary | ICD-10-CM | POA: Diagnosis not present

## 2024-02-28 DIAGNOSIS — M5459 Other low back pain: Secondary | ICD-10-CM | POA: Diagnosis not present

## 2024-02-28 DIAGNOSIS — M25569 Pain in unspecified knee: Secondary | ICD-10-CM | POA: Diagnosis not present

## 2024-02-28 DIAGNOSIS — R296 Repeated falls: Secondary | ICD-10-CM | POA: Diagnosis not present

## 2024-02-28 DIAGNOSIS — M6281 Muscle weakness (generalized): Secondary | ICD-10-CM | POA: Diagnosis not present

## 2024-03-03 DIAGNOSIS — M6281 Muscle weakness (generalized): Secondary | ICD-10-CM | POA: Diagnosis not present

## 2024-03-03 DIAGNOSIS — M25569 Pain in unspecified knee: Secondary | ICD-10-CM | POA: Diagnosis not present

## 2024-03-03 DIAGNOSIS — R296 Repeated falls: Secondary | ICD-10-CM | POA: Diagnosis not present

## 2024-03-03 DIAGNOSIS — M5459 Other low back pain: Secondary | ICD-10-CM | POA: Diagnosis not present

## 2024-03-05 DIAGNOSIS — M5459 Other low back pain: Secondary | ICD-10-CM | POA: Diagnosis not present

## 2024-03-05 DIAGNOSIS — M25569 Pain in unspecified knee: Secondary | ICD-10-CM | POA: Diagnosis not present

## 2024-03-05 DIAGNOSIS — M6281 Muscle weakness (generalized): Secondary | ICD-10-CM | POA: Diagnosis not present

## 2024-03-05 DIAGNOSIS — R296 Repeated falls: Secondary | ICD-10-CM | POA: Diagnosis not present

## 2024-03-13 DIAGNOSIS — M5459 Other low back pain: Secondary | ICD-10-CM | POA: Diagnosis not present

## 2024-03-13 DIAGNOSIS — M25569 Pain in unspecified knee: Secondary | ICD-10-CM | POA: Diagnosis not present

## 2024-03-13 DIAGNOSIS — R296 Repeated falls: Secondary | ICD-10-CM | POA: Diagnosis not present

## 2024-03-13 DIAGNOSIS — M6281 Muscle weakness (generalized): Secondary | ICD-10-CM | POA: Diagnosis not present

## 2024-03-16 ENCOUNTER — Other Ambulatory Visit: Payer: Self-pay | Admitting: Internal Medicine

## 2024-03-16 DIAGNOSIS — G894 Chronic pain syndrome: Secondary | ICD-10-CM

## 2024-03-16 MED ORDER — HYDROCODONE-ACETAMINOPHEN 5-325 MG PO TABS: 1.0000 | ORAL_TABLET | Freq: Four times a day (QID) | ORAL | 0 refills | Status: DC | PRN

## 2024-03-16 NOTE — Telephone Encounter (Signed)
 10/14/23 x3 90 tabs/0 RF

## 2024-03-16 NOTE — Telephone Encounter (Signed)
 Copied from CRM [id number]. Topic: Clinical - Medication Refill >> Mar 16, 2024 11:39 AM Tanazia G wrote: Medication: HYDROcodone -acetaminophen  (NORCO/VICODIN) 5-325 MG tablet  Has the patient contacted their pharmacy? Yes (Agent: If no, request that the patient contact the pharmacy for the refill. If patient does not wish to contact the pharmacy document the reason why and proceed with request.) (Agent: If yes, when and what did the pharmacy advise?)  This is the patient's preferred pharmacy:  [facility], [place] - [address] [place] [address] Phone: [phone] Fax: [phone]   Is this the correct pharmacy for this prescription? Yes If no, delete pharmacy and type the correct one.   Has the prescription been filled recently? Yes  Is the patient out of the medication? Yes  Has the patient been seen for an appointment in the last year OR does the patient have an upcoming appointment? Yes  Can we respond through MyChart? Yes  Agent: Please be advised that Rx refills may take up to 3 business days. We ask that you follow-up with your pharmacy.

## 2024-03-18 DIAGNOSIS — R296 Repeated falls: Secondary | ICD-10-CM | POA: Diagnosis not present

## 2024-03-18 DIAGNOSIS — M25569 Pain in unspecified knee: Secondary | ICD-10-CM | POA: Diagnosis not present

## 2024-03-18 DIAGNOSIS — M6281 Muscle weakness (generalized): Secondary | ICD-10-CM | POA: Diagnosis not present

## 2024-03-18 DIAGNOSIS — M5459 Other low back pain: Secondary | ICD-10-CM | POA: Diagnosis not present

## 2024-03-20 DIAGNOSIS — M25569 Pain in unspecified knee: Secondary | ICD-10-CM | POA: Diagnosis not present

## 2024-03-20 DIAGNOSIS — M5459 Other low back pain: Secondary | ICD-10-CM | POA: Diagnosis not present

## 2024-03-20 DIAGNOSIS — R296 Repeated falls: Secondary | ICD-10-CM | POA: Diagnosis not present

## 2024-03-20 DIAGNOSIS — M6281 Muscle weakness (generalized): Secondary | ICD-10-CM | POA: Diagnosis not present

## 2024-03-25 DIAGNOSIS — M6281 Muscle weakness (generalized): Secondary | ICD-10-CM | POA: Diagnosis not present

## 2024-03-25 DIAGNOSIS — R296 Repeated falls: Secondary | ICD-10-CM | POA: Diagnosis not present

## 2024-03-25 DIAGNOSIS — M25569 Pain in unspecified knee: Secondary | ICD-10-CM | POA: Diagnosis not present

## 2024-03-25 DIAGNOSIS — M5459 Other low back pain: Secondary | ICD-10-CM | POA: Diagnosis not present

## 2024-03-27 DIAGNOSIS — M5459 Other low back pain: Secondary | ICD-10-CM | POA: Diagnosis not present

## 2024-03-27 DIAGNOSIS — R296 Repeated falls: Secondary | ICD-10-CM | POA: Diagnosis not present

## 2024-03-27 DIAGNOSIS — M6281 Muscle weakness (generalized): Secondary | ICD-10-CM | POA: Diagnosis not present

## 2024-03-27 DIAGNOSIS — M25569 Pain in unspecified knee: Secondary | ICD-10-CM | POA: Diagnosis not present

## 2024-04-16 ENCOUNTER — Encounter: Payer: Self-pay | Admitting: Internal Medicine

## 2024-04-16 ENCOUNTER — Ambulatory Visit: Admitting: Internal Medicine

## 2024-04-16 ENCOUNTER — Other Ambulatory Visit: Payer: Self-pay | Admitting: Internal Medicine

## 2024-04-16 VITALS — BP 170/86 | HR 66 | Temp 98.5°F | Wt 153.5 lb

## 2024-04-16 DIAGNOSIS — I10 Essential (primary) hypertension: Secondary | ICD-10-CM

## 2024-04-16 DIAGNOSIS — E782 Mixed hyperlipidemia: Secondary | ICD-10-CM

## 2024-04-16 DIAGNOSIS — E1139 Type 2 diabetes mellitus with other diabetic ophthalmic complication: Secondary | ICD-10-CM | POA: Diagnosis not present

## 2024-04-16 LAB — POCT GLYCOSYLATED HEMOGLOBIN (HGB A1C): Hemoglobin A1C: 6.1 % — AB (ref 4.0–5.6)

## 2024-04-16 MED ORDER — LISINOPRIL 20 MG PO TABS: 20.0000 mg | ORAL_TABLET | Freq: Every day | ORAL | 1 refills | Status: DC

## 2024-04-16 NOTE — Progress Notes (Signed)
 Established Patient Office Visit     CC/Reason for Visit: Follow-up chronic conditions  HPI: [name] is a 83 y.o. female who is coming in today for the above mentioned reasons. Past Medical History is significant for: Hypertension, hyperlipidemia, type 2 diabetes, generalized anxiety, OSA, chronic pain syndrome, history of breast cancer.  She is feeling well, no concerns or complaints.  Her blood pressure has remained elevated at home.   Past Medical/Surgical History: Past Medical History:  Diagnosis Date   Allergy    Anxiety    Arthritis    PAIN AND OA RIGHT KNEE; PAIN AND DDD AND SPINAL STENOSIS   Complication of anesthesia    PULLED OUT MY IV'S AND FOLEY - HALLUCINATIONS WITH HIATAL HERNIA SURGERY   Depression    Diabetes mellitus without complication (HCC)    Diverticulosis    PER COLONOSCOPY   GERD (gastroesophageal reflux disease)    OVER THE COUNTER MEDS IF NEEDED   Heart murmur    VERY SLIGHT ALL MY LIFE   Hyperlipidemia    Hypertension    Sleep apnea    does not tolerate CPAP - HAS NOT USED IN OVER 2 YRS    Past Surgical History:  Procedure Laterality Date   BACK SURGERY     BREAST LUMPECTOMY WITH RADIOACTIVE SEED LOCALIZATION Left 09/26/2021   Procedure: LEFT BREAST LUMPECTOMY WITH RADIOACTIVE SEED LOCALIZATION;  Surgeon: Vernetta Berg, MD;  Location: [facility];  Service: General;  Laterality: Left;   COLONOSCOPY     EYE SURGERY     bilateral cataracts   HIATAL HERNIA REPAIR  09/03/2006   LUMBAR FUSION     LUMBAR LAMINECTOMY     PARTIAL KNEE ARTHROPLASTY Right 07/02/2014   Procedure: RIGHT MEDIAL UNICOMPARTMENTAL KNEE;  Surgeon: Donnice JONETTA Car, MD;  Location: [facility];  Service: Orthopedics;  Laterality: Right;   UPPER GASTROINTESTINAL ENDOSCOPY      Social History:  reports that she has never smoked. She has never used smokeless tobacco. She reports that she does not drink alcohol  and does not use drugs.  Allergies: Allergies  Allergen  Reactions   Ambien  [Zolpidem  Tartrate]     amnesia   Hydrochlorothiazide      Other reaction(s): Lethargy (intolerance)   Prednisone     Rapid heart rate   Thiazide-Type Diuretics Other (See Comments)    could not physically move   Sulfamethoxazole Rash    Family History:  Family History  Problem Relation Age of Onset   Heart disease Mother    Breast cancer Mother    Heart disease Father    Heart disease Sister    Pulmonary embolism Sister    Diabetes Paternal Grandfather      Current Outpatient Medications:    Accu-Chek FastClix Lancets MISC, 1 each by Does not apply route daily., Disp: 100 each, Rfl: 12   ALPRAZolam  (XANAX ) 0.25 MG tablet, Take 1 tablet (0.25 mg total) by mouth 2 (two) times daily as needed. for anxiety, Disp: 60 tablet, Rfl: 2   amLODipine  (NORVASC ) 5 MG tablet, Take 1 tablet (5 mg total) by mouth daily., Disp: 90 tablet, Rfl: 1   Artificial Tear Ointment (DRY EYES OP), Place 1 drop into both eyes at bedtime as needed (Dry eye)., Disp: , Rfl:    Blood Glucose Monitoring Suppl (ACCU-CHEK GUIDE ME) w/Device KIT, 1 each by Does not apply route daily., Disp: 1 kit, Rfl: 2   diclofenac (VOLTAREN) 75 MG EC tablet, Take 1 tablet  twice a day by oral route with meal(s)., Disp: , Rfl:    diclofenac Sodium (VOLTAREN) 1 % GEL, Apply 4 g topically 4 (four) times daily. Apply to bilateral knees., Disp: , Rfl:    diphenhydramine -acetaminophen  (TYLENOL  PM) 25-500 MG TABS tablet, Take 2 tablets by mouth at bedtime., Disp: , Rfl:    fluticasone  (CUTIVATE ) 0.005 % ointment, Apply 1 application topically daily., Disp: , Rfl:    glucose blood (ACCU-CHEK GUIDE) test strip, 1 each by Other route daily. Use as instructed, Disp: 100 each, Rfl: 12   HYDROcodone -acetaminophen  (NORCO/VICODIN) 5-325 MG tablet, Take 1 tablet by mouth 3 (three) times daily., Disp: 90 tablet, Rfl: 0   HYDROcodone -acetaminophen  (NORCO/VICODIN) 5-325 MG tablet, TAKE ONE TABLET BY MOUTH THREE TIMES DAILY AS  NEEDED MODERATE OR FOR SEVERE PAIN, Disp: 90 tablet, Rfl: 0   HYDROcodone -acetaminophen  (NORCO/VICODIN) 5-325 MG tablet, Take 1 tablet by mouth every 6 (six) hours as needed for moderate pain (pain score 4-6)., Disp: 90 tablet, Rfl: 0   ketoconazole (NIZORAL) 2 % shampoo, Apply 1 application topically 3 (three) times a week., Disp: , Rfl:    meloxicam  (MOBIC ) 15 MG tablet, TAKE ONE TABLET BY MOUTH DAILY, Disp: 90 tablet, Rfl: 0   methocarbamol  (ROBAXIN ) 500 MG tablet, Take 1 tablet every 6-8 hours by oral route as needed for spasm for 10 days., Disp: , Rfl:    methocarbamol  (ROBAXIN ) 500 MG tablet, TAKE ONE TABLET BY MOUTH EVERY 6 TO 8 HOURS AS NEEDED FOR MUSCLE SPASMS, Disp: 20 tablet, Rfl: 0   mometasone  (NASONEX ) 50 MCG/ACT nasal spray, Place 2 sprays into the nose at bedtime., Disp: 17 g, Rfl: 6   oxybutynin  (DITROPAN -XL) 10 MG 24 hr tablet, Take 1 tablet (10 mg total) by mouth daily., Disp: 90 tablet, Rfl: 1   raNITIdine  HCl (ZANTAC  PO), Take 4 mg by mouth at bedtime., Disp: , Rfl:    Sennosides (EX-LAX PO), Take 1 tablet by mouth at bedtime., Disp: , Rfl:    simvastatin  (ZOCOR ) 20 MG tablet, Take 1 tablet (20 mg total) by mouth at bedtime., Disp: 90 tablet, Rfl: 1   tamoxifen  (NOLVADEX ) 20 MG tablet, TAKE ONE TABLET BY MOUTH DAILY, Disp: 90 tablet, Rfl: 3   triamcinolone  (NASACORT ) 55 MCG/ACT AERO nasal inhaler, Place 2 sprays into the nose daily., Disp: , Rfl:    triamcinolone  cream (KENALOG ) 0.1 %, APPLY TO RASH SPARINGLY ONCE DAILY, Disp: 30 g, Rfl: 0   lisinopril  (ZESTRIL ) 20 MG tablet, Take 1 tablet (20 mg total) by mouth daily. TAKE ONE TABLET BY MOUTH IN THE MORNING AND TAKE ONE TABLET AT BEDTIME, Disp: 90 tablet, Rfl: 1  Review of Systems:  Negative unless indicated in HPI.   Physical Exam: Vitals:   04/16/24 1117  BP: (!) 170/86  Pulse: 66  Temp: 98.5 F (36.9 C)  TempSrc: Oral  SpO2: 95%  Weight: 153 lb 8 oz (69.6 kg)    Body mass index is 27.19 kg/m.   Physical  Exam   Impression and Plan:  Type 2 diabetes mellitus with other ophthalmic complication, without long-term current use of insulin (HCC) Assessment & Plan: Well-controlled with an A1c of 6.1.  Orders: -     POCT glycosylated hemoglobin (Hb A1C)  Essential hypertension Assessment & Plan: Not well-controlled on lisinopril  10 mg and amlodipine  5 mg.  Increase lisinopril  to 20 mg, follow-up in 3 months.  Orders: -     Lisinopril ; Take 1 tablet (20 mg total) by mouth daily. TAKE  ONE TABLET BY MOUTH IN THE MORNING AND TAKE ONE TABLET AT BEDTIME  Dispense: 90 tablet; Refill: 1  Mixed hyperlipidemia Assessment & Plan: Lipids are well-controlled on current.  Last LDL was 48.      Time spent:31 minutes reviewing chart, interviewing and examining patient and formulating plan of care.     Tully Theophilus Andrews, MD [facility]

## 2024-04-16 NOTE — Assessment & Plan Note (Signed)
 Lipids are well-controlled on current.  Last LDL was 48.

## 2024-04-16 NOTE — Assessment & Plan Note (Signed)
 Not well-controlled on lisinopril  10 mg and amlodipine  5 mg.  Increase lisinopril  to 20 mg, follow-up in 3 months.

## 2024-04-16 NOTE — Assessment & Plan Note (Signed)
 Well-controlled with an A1c of 6.1.

## 2024-04-17 ENCOUNTER — Ambulatory Visit: Payer: Self-pay

## 2024-04-17 DIAGNOSIS — R296 Repeated falls: Secondary | ICD-10-CM | POA: Diagnosis not present

## 2024-04-17 DIAGNOSIS — M5459 Other low back pain: Secondary | ICD-10-CM | POA: Diagnosis not present

## 2024-04-17 DIAGNOSIS — M6281 Muscle weakness (generalized): Secondary | ICD-10-CM | POA: Diagnosis not present

## 2024-04-17 DIAGNOSIS — M25569 Pain in unspecified knee: Secondary | ICD-10-CM | POA: Diagnosis not present

## 2024-04-17 NOTE — Telephone Encounter (Signed)
  Cathy from [employer]  called and stated pt is at pharmacy to pick up medication but there is multiple instructions written for prescription. Pt informed pharm she was expecting an increase in Lisinopril  dosing this RN reviewed office visit notes from yesterday and noted Orders: -     Lisinopril ; Take 1 tablet (20 mg total) by mouth daily. TAKE ONE TABLET BY MOUTH IN THE MORNING AND TAKE ONE TABLET AT BEDTIME  Dispense: 90 tablet; Refill: 1 informed pt to take just 1 daily until further clarification from PCP. Informed Pt to monitor blood pressure and call back if needed. Will route to office high priority.   Pt and pharmacy would like call back as soon as possible on correct dosing instructions.     Copied from CRM [id number]. Topic: Clinical - Medication Question >> Apr 17, 2024 11:20 AM Rea BROCKS wrote: Reason for CRM: [facility] is on the line. Patient is at pharmacy and they need clarity of when to take prescription.  lisinopril  (ZESTRIL ) 20 MG tablet  Take 1 tablet (20 mg total) by mouth daily. TAKE ONE TABLET BY MOUTH IN THE MORNING AND TAKE ONE TABLET AT BEDTIME. >> Apr 17, 2024 11:30 AM Rea BROCKS wrote: Called NT and was advised to call CAL. Called CAL and CAL advised that both patient's provider and nurse are out of office and won't be back in until Monday. Advised pharmacist of this and pharmacist stated if I could reach another nurse because they don't want the patient to be without her blood pressure prescription all weekend.

## 2024-04-20 NOTE — Telephone Encounter (Signed)
 Spoke with pharmacist and informed her that the lisinopril  is once daily.

## 2024-04-24 ENCOUNTER — Other Ambulatory Visit: Payer: Self-pay | Admitting: Internal Medicine

## 2024-04-24 DIAGNOSIS — G894 Chronic pain syndrome: Secondary | ICD-10-CM

## 2024-04-29 DIAGNOSIS — M5459 Other low back pain: Secondary | ICD-10-CM | POA: Diagnosis not present

## 2024-04-29 DIAGNOSIS — R296 Repeated falls: Secondary | ICD-10-CM | POA: Diagnosis not present

## 2024-04-29 DIAGNOSIS — M25569 Pain in unspecified knee: Secondary | ICD-10-CM | POA: Diagnosis not present

## 2024-04-29 DIAGNOSIS — M6281 Muscle weakness (generalized): Secondary | ICD-10-CM | POA: Diagnosis not present

## 2024-05-06 DIAGNOSIS — M6281 Muscle weakness (generalized): Secondary | ICD-10-CM | POA: Diagnosis not present

## 2024-05-06 DIAGNOSIS — M25569 Pain in unspecified knee: Secondary | ICD-10-CM | POA: Diagnosis not present

## 2024-05-06 DIAGNOSIS — R296 Repeated falls: Secondary | ICD-10-CM | POA: Diagnosis not present

## 2024-05-06 DIAGNOSIS — M5459 Other low back pain: Secondary | ICD-10-CM | POA: Diagnosis not present

## 2024-05-08 ENCOUNTER — Other Ambulatory Visit: Payer: Self-pay | Admitting: Internal Medicine

## 2024-05-08 DIAGNOSIS — F411 Generalized anxiety disorder: Secondary | ICD-10-CM

## 2024-05-13 DIAGNOSIS — M170 Bilateral primary osteoarthritis of knee: Secondary | ICD-10-CM | POA: Diagnosis not present

## 2024-05-18 DIAGNOSIS — R296 Repeated falls: Secondary | ICD-10-CM | POA: Diagnosis not present

## 2024-05-18 DIAGNOSIS — M25569 Pain in unspecified knee: Secondary | ICD-10-CM | POA: Diagnosis not present

## 2024-05-18 DIAGNOSIS — M6281 Muscle weakness (generalized): Secondary | ICD-10-CM | POA: Diagnosis not present

## 2024-05-18 DIAGNOSIS — M5459 Other low back pain: Secondary | ICD-10-CM | POA: Diagnosis not present

## 2024-05-22 DIAGNOSIS — M6281 Muscle weakness (generalized): Secondary | ICD-10-CM | POA: Diagnosis not present

## 2024-05-22 DIAGNOSIS — M5459 Other low back pain: Secondary | ICD-10-CM | POA: Diagnosis not present

## 2024-05-22 DIAGNOSIS — R296 Repeated falls: Secondary | ICD-10-CM | POA: Diagnosis not present

## 2024-05-22 DIAGNOSIS — M25569 Pain in unspecified knee: Secondary | ICD-10-CM | POA: Diagnosis not present

## 2024-05-29 ENCOUNTER — Other Ambulatory Visit: Payer: Self-pay

## 2024-05-29 DIAGNOSIS — C50919 Malignant neoplasm of unspecified site of unspecified female breast: Secondary | ICD-10-CM

## 2024-05-29 DIAGNOSIS — Z170 Estrogen receptor positive status [ER+]: Secondary | ICD-10-CM

## 2024-05-31 NOTE — Assessment & Plan Note (Signed)
 invasive lobular carcinoma, stage IA, pT1b, cN0, ER+/PR+/HER2-, Grade 2  -found on screening mammogram. S/p left lumpectomy on 09/26/21 by Dr. Vernetta showed: 1 cm invasive ductal carcinoma; low grade DCIS; ALH. Margins negative. -s/p ultra-hypofractionated radiation therapy under Dr. Izell 2/27-11/03/21. -she started tamoxifen  in late 11/2021, plan for 5 years.

## 2024-06-01 ENCOUNTER — Inpatient Hospital Stay (HOSPITAL_BASED_OUTPATIENT_CLINIC_OR_DEPARTMENT_OTHER): Admitting: Hematology

## 2024-06-01 ENCOUNTER — Inpatient Hospital Stay: Attending: Nurse Practitioner

## 2024-06-01 VITALS — BP 142/90 | HR 83 | Temp 98.2°F | Resp 17 | Wt 151.6 lb

## 2024-06-01 DIAGNOSIS — Z79810 Long term (current) use of selective estrogen receptor modulators (SERMs): Secondary | ICD-10-CM | POA: Diagnosis not present

## 2024-06-01 DIAGNOSIS — C50412 Malignant neoplasm of upper-outer quadrant of left female breast: Secondary | ICD-10-CM | POA: Diagnosis not present

## 2024-06-01 DIAGNOSIS — Z1732 Human epidermal growth factor receptor 2 negative status: Secondary | ICD-10-CM | POA: Insufficient documentation

## 2024-06-01 DIAGNOSIS — Z79899 Other long term (current) drug therapy: Secondary | ICD-10-CM | POA: Insufficient documentation

## 2024-06-01 DIAGNOSIS — Z170 Estrogen receptor positive status [ER+]: Secondary | ICD-10-CM | POA: Diagnosis not present

## 2024-06-01 DIAGNOSIS — Z1721 Progesterone receptor positive status: Secondary | ICD-10-CM | POA: Insufficient documentation

## 2024-06-01 DIAGNOSIS — M8580 Other specified disorders of bone density and structure, unspecified site: Secondary | ICD-10-CM | POA: Insufficient documentation

## 2024-06-01 DIAGNOSIS — Z7983 Long term (current) use of bisphosphonates: Secondary | ICD-10-CM | POA: Insufficient documentation

## 2024-06-01 DIAGNOSIS — C50919 Malignant neoplasm of unspecified site of unspecified female breast: Secondary | ICD-10-CM

## 2024-06-01 LAB — CBC WITH DIFFERENTIAL (CANCER CENTER ONLY)
Abs Immature Granulocytes: 0.02 K/uL (ref 0.00–0.07)
Basophils Absolute: 0.1 K/uL (ref 0.0–0.1)
Basophils Relative: 1 %
Eosinophils Absolute: 0.1 K/uL (ref 0.0–0.5)
Eosinophils Relative: 3 %
HCT: 39.3 % (ref 36.0–46.0)
Hemoglobin: 13.4 g/dL (ref 12.0–15.0)
Immature Granulocytes: 0 %
Lymphocytes Relative: 38 %
Lymphs Abs: 2.2 K/uL (ref 0.7–4.0)
MCH: 33 pg (ref 26.0–34.0)
MCHC: 34.1 g/dL (ref 30.0–36.0)
MCV: 96.8 fL (ref 80.0–100.0)
Monocytes Absolute: 0.5 K/uL (ref 0.1–1.0)
Monocytes Relative: 8 %
Neutro Abs: 2.8 K/uL (ref 1.7–7.7)
Neutrophils Relative %: 50 %
Platelet Count: 193 K/uL (ref 150–400)
RBC: 4.06 MIL/uL (ref 3.87–5.11)
RDW: 13 % (ref 11.5–15.5)
WBC Count: 5.7 K/uL (ref 4.0–10.5)
nRBC: 0 % (ref 0.0–0.2)

## 2024-06-01 LAB — CMP (CANCER CENTER ONLY)
ALT: 8 U/L (ref 0–44)
AST: 14 U/L — ABNORMAL LOW (ref 15–41)
Albumin: 4.1 g/dL (ref 3.5–5.0)
Alkaline Phosphatase: 35 U/L — ABNORMAL LOW (ref 38–126)
Anion gap: 7 (ref 5–15)
BUN: 18 mg/dL (ref 8–23)
CO2: 29 mmol/L (ref 22–32)
Calcium: 9.3 mg/dL (ref 8.9–10.3)
Chloride: 104 mmol/L (ref 98–111)
Creatinine: 0.8 mg/dL (ref 0.44–1.00)
GFR, Estimated: >60 mL/min (ref >60)
Glucose, Bld: 113 mg/dL — ABNORMAL HIGH (ref 70–99)
Potassium: 3.8 mmol/L (ref 3.5–5.1)
Sodium: 140 mmol/L (ref 135–145)
Total Bilirubin: 0.8 mg/dL (ref 0.0–1.2)
Total Protein: 7.1 g/dL (ref 6.5–8.1)

## 2024-06-01 MED ORDER — TAMOXIFEN CITRATE 20 MG PO TABS: ORAL_TABLET | ORAL | 3 refills | Status: AC

## 2024-06-01 MED ORDER — ALENDRONATE SODIUM 70 MG PO TABS: 70.0000 mg | ORAL_TABLET | ORAL | 5 refills | Status: AC

## 2024-06-01 NOTE — Progress Notes (Signed)
 [facility]   [phone] [phone]   Clinic Follow up Note   Patient Care Team: Theophilus Andrews, Tully GRADE, MD as PCP - General (Internal Medicine) Vernetta Berg, MD as Consulting Physician (General Surgery) Lanny Callander, MD as Consulting Physician (Hematology) Izell Domino, MD as Attending Physician (Radiation Oncology) Roz Anes, MD as Consulting Physician (Ophthalmology) Tyree Nanetta SAILOR, RN as Oncology Nurse Navigator  Date of Service:  06/01/2024  CHIEF COMPLAINT: f/u of left breast cancer  CURRENT THERAPY:  Adjuvant tamoxifen   Oncology History   Malignant neoplasm of upper-outer quadrant of left breast in female, estrogen receptor positive (HCC) invasive lobular carcinoma, stage IA, pT1b, cN0, ER+/PR+/HER2-, Grade 2  -found on screening mammogram. S/p left lumpectomy on 09/26/21 by Dr. Vernetta showed: 1 cm invasive ductal carcinoma; low grade DCIS; ALH. Margins negative. -s/p ultra-hypofractionated radiation therapy under Dr. Izell 2/27-11/03/21. -she started tamoxifen  in late 11/2021, plan for 5 years.   Assessment & Plan Malignant neoplasm of upper-outer quadrant of left breast Breast cancer diagnosed in early 2024, currently on tamoxifen  therapy with no reported side effects. Last mammogram in November 2024, due for routine screening in November 2025. No breast pain or other side effects. - Refill tamoxifen  for three months with three refills - Order diagnostic mammogram for November 2025 - Continue tamoxifen  therapy  Osteopenia with increased risk of fracture Osteopenia identified on bone density scan last year. Not on calcium  or vitamin D  supplements. Discussed alendronate, a once-weekly oral bisphosphonate, to reduce fracture risk. She is willing to try oral medication. No dental issues reported. - Prescribe alendronate once weekly - Instruct to start calcium  and vitamin D  supplementation  Falls with balance  impairment History of falls, most recent in January on ice, resulting in ongoing pain. Undergoing physical therapy. Balance issues present, uses a cane and walker for stability. Lives alone with a smartwatch for emergency assistance. No fractures from recent falls. - Continue physical therapy - Encourage use of walker for stability, especially outdoors  Lumbar bulging disc Reports a bulging disc likely contributing to pain and balance issues.  Osteopenia - She lives alone, does use a walker and cane -She has had multiple falls in the past few years, no fractures - I recommend biphosphonate, she declined Zometa due to high co-pay.  I recommend oral alendronate.  Benefit and side effects especially risk of jaw necrosis, were discussed with her, she agrees to try.  Plan - She is clinically doing well, no concern for breast cancer recurrence - Continue tamoxifen  - Due to her osteopenia and multiple falls, I recommend biphosphonate.  She declines Zometa due to high co-pay, I called in alendronate  - Lab and follow-up in 6 months  SUMMARY OF ONCOLOGIC HISTORY: Oncology History Overview Note   Cancer Staging  Malignant neoplasm of upper-outer quadrant of left breast in female, estrogen receptor positive (HCC) Staging form: Breast, AJCC 8th Edition - Clinical stage from 08/17/2021: Stage IA (cT1c, cN0, cM0, G2, ER+, PR+, HER2-) - Signed by Lanny Callander, MD on 09/18/2021    Malignant neoplasm of upper-outer quadrant of left breast in female, estrogen receptor positive (HCC)  08/09/2021 Mammogram   EXAM: DIGITAL DIAGNOSTIC UNILATERAL LEFT MAMMOGRAM WITH TOMOSYNTHESIS AND CAD; ULTRASOUND LEFT BREAST LIMITED  IMPRESSION: 1.  There is a suspicious mass in the left breast at 1 o'clock.   2.  No evidence of left axillary lymphadenopathy.   08/17/2021 Cancer Staging   Staging form: Breast, AJCC 8th Edition - Clinical stage from  08/17/2021: Stage IA (cT1c, cN0, cM0, G2, ER+, PR+, HER2-) - Signed by  Lanny Callander, MD on 09/18/2021 Stage prefix: Initial diagnosis Histologic grading system: 3 grade system   08/17/2021 Initial Biopsy   Diagnosis Breast, left, needle core biopsy, left breast 1:00, same - INVASIVE MAMMARY CARCINOMA, GRADE 1/2. - SEE MICROSCOPIC DESCRIPTION. Microscopic Comment The greatest tumor dimension is 1.1 cm .  Addendum: Immunohistochemistry for E-cadherin is negative consistent with lobular carcinoma.  PROGNOSTIC INDICATORS Results: The tumor cells are EQUIVOCAL for Her2 (2+). Her2 by FISH will be performed and results reported separately. Estrogen Receptor: 95%, POSITIVE, STRONG STAINING INTENSITY Progesterone Receptor: 100%, POSITIVE, STRONG STAINING INTENSITY Proliferation Marker Ki67: 5%  FLUORESCENCE IN-SITU HYBRIDIZATION Results: GROUP 5: HER2 **NEGATIVE**   09/18/2021 Initial Diagnosis   Malignant neoplasm of upper-outer quadrant of left breast in female, estrogen receptor positive (HCC)   09/26/2021 Cancer Staging   Staging form: Breast, AJCC 8th Edition - Pathologic stage from 09/26/2021: Stage IA (pT1b, pN0, cM0, G2, ER+, PR+, HER2-) - Signed by Lanny Callander, MD on 11/10/2021 Stage prefix: Initial diagnosis Histologic grading system: 3 grade system Residual tumor (R): R0 - None   09/26/2021 Definitive Surgery   FINAL MICROSCOPIC DIAGNOSIS:   A. BREAST, LEFT, LUMPECTOMY:  - Invasive ductal carcinoma, 1 cm, grade 2  - Ductal carcinoma in situ, low-grade  - Resection margins are negative for carcinoma - closest is the superior margin at 0.3 cm  - Atypical lobular hyperplasia  - Biopsy site changes  - See oncology table    10/30/2021 - 11/03/2021 Radiation Therapy        Discussed the use of AI scribe software for clinical note transcription with the patient, who gave verbal consent to proceed.  History of Present Illness [name] is an 83 year old female with breast cancer who presents for follow-up.  She is currently on tamoxifen  for  breast cancer management. Her last mammogram was in November, with the next one due in November.  She has experienced multiple falls, including a significant fall on January 15th on ice, resulting in ongoing pain and a possible ruptured disc. She is undergoing physical therapy and uses a cane for stability and a walker on her gravel driveway. Another fall occurred 3-4 weeks ago while getting out of a car, but no fractures were sustained.  She lives alone and receives support from her children, including assistance with household tasks. She communicates with her son almost daily and uses a Garment/textile technologist for emergencies.  She has osteopenia and is not taking calcium  or vitamin D  supplements. She has not had any fractures but has a bulging disc.     All other systems were reviewed with the patient and are negative.  MEDICAL HISTORY:  Past Medical History:  Diagnosis Date   Allergy    Anxiety    Arthritis    PAIN AND OA RIGHT KNEE; PAIN AND DDD AND SPINAL STENOSIS   Complication of anesthesia    PULLED OUT MY IV'S AND FOLEY - HALLUCINATIONS WITH HIATAL HERNIA SURGERY   Depression    Diabetes mellitus without complication (HCC)    Diverticulosis    PER COLONOSCOPY   GERD (gastroesophageal reflux disease)    OVER THE COUNTER MEDS IF NEEDED   Heart murmur    VERY SLIGHT ALL MY LIFE   Hyperlipidemia    Hypertension    Sleep apnea    does not tolerate CPAP - HAS NOT USED IN OVER 2 YRS  SURGICAL HISTORY: Past Surgical History:  Procedure Laterality Date   BACK SURGERY     BREAST LUMPECTOMY WITH RADIOACTIVE SEED LOCALIZATION Left 09/26/2021   Procedure: LEFT BREAST LUMPECTOMY WITH RADIOACTIVE SEED LOCALIZATION;  Surgeon: Vernetta Berg, MD;  Location: [facility];  Service: General;  Laterality: Left;   COLONOSCOPY     EYE SURGERY     bilateral cataracts   HIATAL HERNIA REPAIR  09/03/2006   LUMBAR FUSION     LUMBAR LAMINECTOMY     PARTIAL KNEE ARTHROPLASTY Right 07/02/2014    Procedure: RIGHT MEDIAL UNICOMPARTMENTAL KNEE;  Surgeon: Donnice JONETTA Car, MD;  Location: [facility];  Service: Orthopedics;  Laterality: Right;   UPPER GASTROINTESTINAL ENDOSCOPY      I have reviewed the social history and family history with the patient and they are unchanged from previous note.  ALLERGIES:  is allergic to ambien  [zolpidem  tartrate], hydrochlorothiazide , prednisone, thiazide-type diuretics, and sulfamethoxazole.  MEDICATIONS:  Current Outpatient Medications  Medication Sig Dispense Refill   alendronate (FOSAMAX) 70 MG tablet Take 1 tablet (70 mg total) by mouth once a week. Take with a full glass of water on an empty stomach. 4 tablet 5   Accu-Chek FastClix Lancets MISC 1 each by Does not apply route daily. 100 each 12   ALPRAZolam  (XANAX ) 0.25 MG tablet Take 1 tablet (0.25 mg total) by mouth 2 (two) times daily as needed. for anxiety 60 tablet 2   amLODipine  (NORVASC ) 5 MG tablet Take 1 tablet (5 mg total) by mouth daily. 90 tablet 1   Artificial Tear Ointment (DRY EYES OP) Place 1 drop into both eyes at bedtime as needed (Dry eye).     Blood Glucose Monitoring Suppl (ACCU-CHEK GUIDE ME) w/Device KIT 1 each by Does not apply route daily. 1 kit 2   diclofenac (VOLTAREN) 75 MG EC tablet Take 1 tablet twice a day by oral route with meal(s).     diclofenac Sodium (VOLTAREN) 1 % GEL Apply 4 g topically 4 (four) times daily. Apply to bilateral knees.     diphenhydramine -acetaminophen  (TYLENOL  PM) 25-500 MG TABS tablet Take 2 tablets by mouth at bedtime.     fluticasone  (CUTIVATE ) 0.005 % ointment Apply 1 application topically daily.     glucose blood (ACCU-CHEK GUIDE) test strip 1 each by Other route daily. Use as instructed 100 each 12   HYDROcodone -acetaminophen  (NORCO/VICODIN) 5-325 MG tablet Take 1 tablet by mouth 3 (three) times daily. 90 tablet 0   HYDROcodone -acetaminophen  (NORCO/VICODIN) 5-325 MG tablet TAKE ONE TABLET BY MOUTH THREE TIMES DAILY AS NEEDED MODERATE OR FOR SEVERE  PAIN 90 tablet 0   HYDROcodone -acetaminophen  (NORCO/VICODIN) 5-325 MG tablet Take 1 tablet by mouth every 6 (six) hours as needed for moderate pain (pain score 4-6). 90 tablet 0   ketoconazole (NIZORAL) 2 % shampoo Apply 1 application topically 3 (three) times a week.     lisinopril  (ZESTRIL ) 20 MG tablet Take 1 tablet (20 mg total) by mouth daily. 90 tablet 1   meloxicam  (MOBIC ) 15 MG tablet TAKE ONE TABLET BY MOUTH DAILY 90 tablet 0   methocarbamol  (ROBAXIN ) 500 MG tablet Take 1 tablet every 6-8 hours by oral route as needed for spasm for 10 days.     methocarbamol  (ROBAXIN ) 500 MG tablet TAKE ONE TABLET BY MOUTH EVERY 6 TO 8 HOURS AS NEEDED FOR MUSCLE SPASMS 20 tablet 0   mometasone  (NASONEX ) 50 MCG/ACT nasal spray Place 2 sprays into the nose at bedtime. 17 g 6   oxybutynin  (  DITROPAN -XL) 10 MG 24 hr tablet Take 1 tablet (10 mg total) by mouth daily. 90 tablet 1   raNITIdine  HCl (ZANTAC  PO) Take 4 mg by mouth at bedtime.     Sennosides (EX-LAX PO) Take 1 tablet by mouth at bedtime.     simvastatin  (ZOCOR ) 20 MG tablet Take 1 tablet (20 mg total) by mouth at bedtime. 90 tablet 1   tamoxifen  (NOLVADEX ) 20 MG tablet TAKE ONE TABLET BY MOUTH DAILY 90 tablet 3   triamcinolone  (NASACORT ) 55 MCG/ACT AERO nasal inhaler Place 2 sprays into the nose daily.     triamcinolone  cream (KENALOG ) 0.1 % APPLY TO RASH SPARINGLY ONCE DAILY 30 g 0   No current facility-administered medications for this visit.    PHYSICAL EXAMINATION: ECOG PERFORMANCE STATUS: 1 - Symptomatic but completely ambulatory  Vitals:   06/01/24 1053 06/01/24 1054  BP: (!) 145/96 (!) 142/90  Pulse: 83   Resp: 17   Temp: 98.2 F (36.8 C)   SpO2: 100%    Wt Readings from Last 3 Encounters:  06/01/24 151 lb 9.6 oz (68.8 kg)  04/16/24 153 lb 8 oz (69.6 kg)  01/15/24 159 lb (72.1 kg)     GENERAL:alert, no distress and comfortable SKIN: skin color, texture, turgor are normal, no rashes or significant lesions EYES: normal,  Conjunctiva are pink and non-injected, sclera clear NECK: supple, thyroid  normal size, non-tender, without nodularity LYMPH:  no palpable lymphadenopathy in the cervical, axillary  LUNGS: clear to auscultation and percussion with normal breathing effort HEART: regular rate & rhythm and no murmurs and no lower extremity edema ABDOMEN:abdomen soft, non-tender and normal bowel sounds Musculoskeletal:no cyanosis of digits and no clubbing  NEURO: alert & oriented x 3 with fluent speech, no focal motor/sensory deficits Breasts: Breast inspection showed them to be symmetrical with no nipple discharge. Palpation of the breasts and axilla revealed no obvious mass that I could appreciate.  Physical Exam   LABORATORY DATA:  I have reviewed the data as listed    Latest Ref Rng & Units 06/01/2024   10:28 AM 11/27/2023   10:36 AM 10/14/2023   11:36 AM  CBC  WBC 4.0 - 10.5 K/uL 5.7  6.9  7.7   Hemoglobin 12.0 - 15.0 g/dL 86.5  87.2  86.8   Hematocrit 36.0 - 46.0 % 39.3  38.6  40.2   Platelets 150 - 400 K/uL 193  234  237.0         Latest Ref Rng & Units 06/01/2024   10:28 AM 11/27/2023   10:36 AM 10/14/2023   11:36 AM  CMP  Glucose 70 - 99 mg/dL 886  858  873   BUN 8 - 23 mg/dL 18  13  11    Creatinine 0.44 - 1.00 mg/dL 9.19  9.24  9.22   Sodium 135 - 145 mmol/L 140  139  139   Potassium 3.5 - 5.1 mmol/L 3.8  4.0  3.9   Chloride 98 - 111 mmol/L 104  104  103   CO2 22 - 32 mmol/L 29  29  27    Calcium  8.9 - 10.3 mg/dL 9.3  9.1  9.1   Total Protein 6.5 - 8.1 g/dL 7.1  6.7  6.8   Total Bilirubin 0.0 - 1.2 mg/dL 0.8  0.6  0.5   Alkaline Phos 38 - 126 U/L 35  38  58   AST 15 - 41 U/L 14  11  15    ALT 0 - 44 U/L 8  7  7       RADIOGRAPHIC STUDIES: I have personally reviewed the radiological images as listed and agreed with the findings in the report. No results found.    Orders Placed This Encounter  Procedures   MM 3D DIAGNOSTIC MAMMOGRAM BILATERAL BREAST    Standing Status:   Future     Expected Date:   07/28/2024    Expiration Date:   06/01/2025    Reason for Exam (SYMPTOM  OR DIAGNOSIS REQUIRED):   screening    Preferred imaging location?:   [facility]   All questions were answered. The patient knows to call the clinic with any problems, questions or concerns. No barriers to learning was detected. The total time spent in the appointment was 30 minutes, including review of chart and various tests results, discussions about plan of care and coordination of care plan     Onita Mattock, MD 06/01/2024

## 2024-06-04 DIAGNOSIS — R296 Repeated falls: Secondary | ICD-10-CM | POA: Diagnosis not present

## 2024-06-04 DIAGNOSIS — M6281 Muscle weakness (generalized): Secondary | ICD-10-CM | POA: Diagnosis not present

## 2024-06-04 DIAGNOSIS — M25569 Pain in unspecified knee: Secondary | ICD-10-CM | POA: Diagnosis not present

## 2024-06-04 DIAGNOSIS — M5459 Other low back pain: Secondary | ICD-10-CM | POA: Diagnosis not present

## 2024-06-12 ENCOUNTER — Encounter (HOSPITAL_COMMUNITY): Payer: Self-pay

## 2024-06-12 ENCOUNTER — Ambulatory Visit: Payer: Self-pay

## 2024-06-12 ENCOUNTER — Ambulatory Visit (HOSPITAL_COMMUNITY): Admission: EM | Admit: 2024-06-12 | Discharge: 2024-06-12 | Disposition: A | Discharge status: Home / Self Care

## 2024-06-12 DIAGNOSIS — N3001 Acute cystitis with hematuria: Secondary | ICD-10-CM | POA: Diagnosis not present

## 2024-06-12 HISTORY — DX: Malignant (primary) neoplasm, unspecified: C80.1

## 2024-06-12 LAB — POCT URINALYSIS DIP (MANUAL ENTRY)
Bilirubin, UA: NEGATIVE
Glucose, UA: NEGATIVE mg/dL
Nitrite, UA: NEGATIVE
Protein Ur, POC: NEGATIVE mg/dL
Spec Grav, UA: 1.015 (ref 1.010–1.025)
Urobilinogen, UA: 0.2 U/dL
pH, UA: 6 (ref 5.0–8.0)

## 2024-06-12 MED ORDER — PHENAZOPYRIDINE HCL 200 MG PO TABS: 200.0000 mg | ORAL_TABLET | Freq: Three times a day (TID) | ORAL | 0 refills | Status: DC

## 2024-06-12 MED ORDER — NITROFURANTOIN MONOHYD MACRO 100 MG PO CAPS: 100.0000 mg | ORAL_CAPSULE | Freq: Two times a day (BID) | ORAL | 0 refills | Status: AC

## 2024-06-12 NOTE — Telephone Encounter (Signed)
 FYI Only or Action Required?: FYI only for provider.  Patient was last seen in primary care on 04/16/2024 by Theophilus Andrews, Tully GRADE, MD.  Called Nurse Triage reporting Vaginal Pain.  Symptoms began several weeks ago.  Interventions attempted: Nothing.  Symptoms are: gradually worsening.  Triage Disposition: See Physician Within 24 Hours  Patient/caregiver understands and will follow disposition?: Yes     Copied from CRM #[id number]. Topic: Clinical - Red Word Triage >> Jun 12, 2024  3:44 PM [name]: Red Word that prompted transfer to Nurse Triage: Pt is having extreme pain and burning believes its a UTI. Reason for Disposition  [1] MILD to MODERATE vaginal pain AND [2] present > 24 hours  (Exception: Chronic pain.)  Answer Assessment - Initial Assessment Questions Patient giving information to be seen at [facility] for symptoms. Patient states she will walk in now.   1. SYMPTOM: What's the main symptom you're concerned about? (e.g., pain, itching, dryness)     Pain and discomfort  2. LOCATION: Where is the pain located? (e.g., inside/outside, left/right)     Vaginal area  3. ONSET: When did the  Pain  start?     A few weeks  4. PAIN: Is there any pain? If Yes, ask: How bad is it? (Scale: 1-10; mild, moderate, severe)     Moderate  5. ITCHING: Is there any itching? If Yes, ask: How bad is it? (Scale: 1-10; mild, moderate, severe)     Moderate  6. CAUSE: What do you think is causing the discharge? Have you had the same problem before? What happened then?     Possible UTI  7. OTHER SYMPTOMS: Do you have any other symptoms? (e.g., fever, itching, vaginal bleeding, pain with urination, injury to genital area, vaginal foreign body)     Back pain  Protocols used: Vaginal Symptoms-A-AH

## 2024-06-12 NOTE — Discharge Instructions (Signed)
  1. Acute cystitis with hematuria (Primary) - POC urinalysis dipstick completed in UC shows moderate blood, small leukocytes, no nitrite, these findings are possibly indicative of urinary tract infection - nitrofurantoin, macrocrystal-monohydrate, (MACROBID) 100 MG capsule; Take 1 capsule (100 mg total) by mouth 2 (two) times daily for 7 days.  Dispense: 14 capsule; Refill: 0 - phenazopyridine (PYRIDIUM) 200 MG tablet; Take 1 tablet (200 mg total) by mouth 3 (three) times daily.  Dispense: 6 tablet; Refill: 0 - Urine Culture collected in UC and sent to lab for further testing results should be available in 2 to 3 days. -Continue to monitor symptoms for any change in severity if there is any escalation of current symptoms or development of new symptoms follow-up in ER for further evaluation and management.

## 2024-06-12 NOTE — ED Triage Notes (Signed)
 Patient c/o dysuria, urinary frequency, and intermittent small amounts of blood that is visible x 3-4 weeks.  Patient states she went to [facility] and was given medication for a yeast infection, but not for a UTI.

## 2024-06-12 NOTE — ED Provider Notes (Signed)
 [facility]  Note:  This document was prepared using Conservation officer, historic buildings and may include unintentional dictation errors.  MRN: [id number] DOB: [birth year]  Subjective:   [name] is a 83 y.o. female presenting for evaluation of dysuria, increased urinary frequency, and mild hematuria x 3 to 4 days.  Patient denies any severe abdominal pain, flank pain, urinary hesitancy, fever, nausea/vomiting or diarrhea.  Patient reports that she was trying to be seen by her primary care provider today for symptoms but was unable to get an appointment.  Patient has past history of urinary tract infection and just completed medication therapy for yeast infection.  Patient denies taking any over-the-counter medication for her symptoms prior to arriving in urgent care today.  No current facility-administered medications for this encounter.  Current Outpatient Medications:    nitrofurantoin, macrocrystal-monohydrate, (MACROBID) 100 MG capsule, Take 1 capsule (100 mg total) by mouth 2 (two) times daily for 7 days., Disp: 14 capsule, Rfl: 0   phenazopyridine (PYRIDIUM) 200 MG tablet, Take 1 tablet (200 mg total) by mouth 3 (three) times daily., Disp: 6 tablet, Rfl: 0   Accu-Chek FastClix Lancets MISC, 1 each by Does not apply route daily., Disp: 100 each, Rfl: 12   alendronate (FOSAMAX) 70 MG tablet, Take 1 tablet (70 mg total) by mouth once a week. Take with a full glass of water on an empty stomach. (Patient not taking: Reported on 06/12/2024), Disp: 4 tablet, Rfl: 5   ALPRAZolam  (XANAX ) 0.25 MG tablet, Take 1 tablet (0.25 mg total) by mouth 2 (two) times daily as needed. for anxiety, Disp: 60 tablet, Rfl: 2   amLODipine  (NORVASC ) 5 MG tablet, Take 1 tablet (5 mg total) by mouth daily., Disp: 90 tablet, Rfl: 1   Artificial Tear Ointment (DRY EYES OP), Place 1 drop into both eyes at bedtime as needed (Dry eye)., Disp: , Rfl:    Blood Glucose Monitoring Suppl (ACCU-CHEK GUIDE  ME) w/Device KIT, 1 each by Does not apply route daily., Disp: 1 kit, Rfl: 2   diclofenac (VOLTAREN) 75 MG EC tablet, Take 1 tablet twice a day by oral route with meal(s)., Disp: , Rfl:    diclofenac Sodium (VOLTAREN) 1 % GEL, Apply 4 g topically 4 (four) times daily. Apply to bilateral knees., Disp: , Rfl:    diphenhydramine -acetaminophen  (TYLENOL  PM) 25-500 MG TABS tablet, Take 2 tablets by mouth at bedtime., Disp: , Rfl:    fluticasone  (CUTIVATE ) 0.005 % ointment, Apply 1 application topically daily., Disp: , Rfl:    glucose blood (ACCU-CHEK GUIDE) test strip, 1 each by Other route daily. Use as instructed, Disp: 100 each, Rfl: 12   HYDROcodone -acetaminophen  (NORCO/VICODIN) 5-325 MG tablet, Take 1 tablet by mouth 3 (three) times daily., Disp: 90 tablet, Rfl: 0   HYDROcodone -acetaminophen  (NORCO/VICODIN) 5-325 MG tablet, TAKE ONE TABLET BY MOUTH THREE TIMES DAILY AS NEEDED MODERATE OR FOR SEVERE PAIN, Disp: 90 tablet, Rfl: 0   HYDROcodone -acetaminophen  (NORCO/VICODIN) 5-325 MG tablet, Take 1 tablet by mouth every 6 (six) hours as needed for moderate pain (pain score 4-6)., Disp: 90 tablet, Rfl: 0   ketoconazole (NIZORAL) 2 % shampoo, Apply 1 application topically 3 (three) times a week., Disp: , Rfl:    lisinopril  (ZESTRIL ) 20 MG tablet, Take 1 tablet (20 mg total) by mouth daily., Disp: 90 tablet, Rfl: 1   meloxicam  (MOBIC ) 15 MG tablet, TAKE ONE TABLET BY MOUTH DAILY, Disp: 90 tablet, Rfl: 0   methocarbamol  (ROBAXIN ) 500 MG tablet, Take 1  tablet every 6-8 hours by oral route as needed for spasm for 10 days., Disp: , Rfl:    methocarbamol  (ROBAXIN ) 500 MG tablet, TAKE ONE TABLET BY MOUTH EVERY 6 TO 8 HOURS AS NEEDED FOR MUSCLE SPASMS, Disp: 20 tablet, Rfl: 0   mometasone  (NASONEX ) 50 MCG/ACT nasal spray, Place 2 sprays into the nose at bedtime., Disp: 17 g, Rfl: 6   oxybutynin  (DITROPAN -XL) 10 MG 24 hr tablet, Take 1 tablet (10 mg total) by mouth daily., Disp: 90 tablet, Rfl: 1   raNITIdine  HCl  (ZANTAC  PO), Take 4 mg by mouth at bedtime., Disp: , Rfl:    Sennosides (EX-LAX PO), Take 1 tablet by mouth at bedtime., Disp: , Rfl:    simvastatin  (ZOCOR ) 20 MG tablet, Take 1 tablet (20 mg total) by mouth at bedtime., Disp: 90 tablet, Rfl: 1   tamoxifen  (NOLVADEX ) 20 MG tablet, TAKE ONE TABLET BY MOUTH DAILY, Disp: 90 tablet, Rfl: 3   triamcinolone  (NASACORT ) 55 MCG/ACT AERO nasal inhaler, Place 2 sprays into the nose daily., Disp: , Rfl:    triamcinolone  cream (KENALOG ) 0.1 %, APPLY TO RASH SPARINGLY ONCE DAILY, Disp: 30 g, Rfl: 0   Allergies  Allergen Reactions   Ambien  [Zolpidem  Tartrate]     amnesia   Hydrochlorothiazide      Other reaction(s): Lethargy (intolerance)   Prednisone     Rapid heart rate   Thiazide-Type Diuretics Other (See Comments)    could not physically move   Sulfamethoxazole Rash    Past Medical History:  Diagnosis Date   Allergy    Anxiety    Arthritis    PAIN AND OA RIGHT KNEE; PAIN AND DDD AND SPINAL STENOSIS   Cancer (HCC)    Complication of anesthesia    PULLED OUT MY IV'S AND FOLEY - HALLUCINATIONS WITH HIATAL HERNIA SURGERY   Depression    Diabetes mellitus without complication (HCC)    Diverticulosis    PER COLONOSCOPY   GERD (gastroesophageal reflux disease)    OVER THE COUNTER MEDS IF NEEDED   Heart murmur    VERY SLIGHT ALL MY LIFE   Hyperlipidemia    Hypertension    Sleep apnea    does not tolerate CPAP - HAS NOT USED IN OVER 2 YRS     Past Surgical History:  Procedure Laterality Date   BACK SURGERY     BREAST LUMPECTOMY WITH RADIOACTIVE SEED LOCALIZATION Left 09/26/2021   Procedure: LEFT BREAST LUMPECTOMY WITH RADIOACTIVE SEED LOCALIZATION;  Surgeon: Vernetta Berg, MD;  Location: [facility];  Service: General;  Laterality: Left;   COLONOSCOPY     EYE SURGERY     bilateral cataracts   HIATAL HERNIA REPAIR  09/03/2006   LUMBAR FUSION     LUMBAR LAMINECTOMY     PARTIAL KNEE ARTHROPLASTY Right 07/02/2014   Procedure: RIGHT  MEDIAL UNICOMPARTMENTAL KNEE;  Surgeon: Donnice JONETTA Car, MD;  Location: [facility];  Service: Orthopedics;  Laterality: Right;   UPPER GASTROINTESTINAL ENDOSCOPY      Family History  Problem Relation Age of Onset   Heart disease Mother    Breast cancer Mother    Heart disease Father    Heart disease Sister    Pulmonary embolism Sister    Diabetes Paternal Grandfather     Social History   Tobacco Use   Smoking status: Never   Smokeless tobacco: Never  Vaping Use   Vaping status: Never Used  Substance Use Topics   Alcohol  use: No   Drug  use: No    ROS Refer to HPI for ROS details.  Objective:    Vitals: BP (!) 174/96 (BP Location: Right Arm)   Pulse 79   Temp 98.4 F (36.9 C) (Oral)   Resp 16   SpO2 96%   Physical Exam Vitals and nursing note reviewed.  Constitutional:      General: She is not in acute distress.    Appearance: Normal appearance. She is well-developed. She is not ill-appearing or toxic-appearing.  HENT:     Head: Normocephalic and atraumatic.  Cardiovascular:     Rate and Rhythm: Normal rate.  Pulmonary:     Effort: Pulmonary effort is normal. No respiratory distress.  Abdominal:     Palpations: Abdomen is soft.     Tenderness: There is no abdominal tenderness. There is no right CVA tenderness or left CVA tenderness.  Skin:    General: Skin is warm and dry.  Neurological:     General: No focal deficit present.     Mental Status: She is alert and oriented to person, place, and time.  Psychiatric:        Mood and Affect: Mood normal.        Behavior: Behavior normal.     Procedures  Results for orders placed or performed during the hospital encounter of 06/12/24 (from the past 24 hours)  POC urinalysis dipstick     Status: Abnormal   Collection Time: 06/12/24  6:11 PM  Result Value Ref Range   Color, UA yellow yellow   Clarity, UA turbid (A) clear   Glucose, UA negative negative mg/dL   Bilirubin, UA negative negative   Ketones, POC UA  trace (5) (A) negative mg/dL   Spec Grav, UA 8.984 8.989 - 1.025   Blood, UA moderate (A) negative   pH, UA 6.0 5.0 - 8.0   Protein Ur, POC negative negative mg/dL   Urobilinogen, UA 0.2 0.2 or 1.0 E.U./dL   Nitrite, UA Negative Negative   Leukocytes, UA Small (1+) (A) Negative    Assessment and Plan :     Discharge Instructions       1. Acute cystitis with hematuria (Primary) - POC urinalysis dipstick completed in UC shows moderate blood, small leukocytes, no nitrite, these findings are possibly indicative of urinary tract infection - nitrofurantoin, macrocrystal-monohydrate, (MACROBID) 100 MG capsule; Take 1 capsule (100 mg total) by mouth 2 (two) times daily for 7 days.  Dispense: 14 capsule; Refill: 0 - phenazopyridine (PYRIDIUM) 200 MG tablet; Take 1 tablet (200 mg total) by mouth 3 (three) times daily.  Dispense: 6 tablet; Refill: 0 - Urine Culture collected in UC and sent to lab for further testing results should be available in 2 to 3 days. -Continue to monitor symptoms for any change in severity if there is any escalation of current symptoms or development of new symptoms follow-up in ER for further evaluation and management.      Zeena Starkel B Jolly Bleicher   Alanson Hausmann, Wallace B, [place] 06/12/24 [phone]

## 2024-06-13 ENCOUNTER — Other Ambulatory Visit: Payer: Self-pay | Admitting: Internal Medicine

## 2024-06-13 DIAGNOSIS — G894 Chronic pain syndrome: Secondary | ICD-10-CM

## 2024-06-15 ENCOUNTER — Ambulatory Visit (HOSPITAL_COMMUNITY): Payer: Self-pay

## 2024-06-15 LAB — URINE CULTURE
Culture: >=100000 — AB
Special Requests: NORMAL

## 2024-07-17 ENCOUNTER — Encounter

## 2024-07-22 ENCOUNTER — Ambulatory Visit (INDEPENDENT_AMBULATORY_CARE_PROVIDER_SITE_OTHER): Admitting: Internal Medicine

## 2024-07-22 ENCOUNTER — Encounter: Payer: Self-pay | Admitting: Internal Medicine

## 2024-07-22 VITALS — BP 110/78 | HR 76 | Temp 98.6°F | Wt 151.9 lb

## 2024-07-22 DIAGNOSIS — F411 Generalized anxiety disorder: Secondary | ICD-10-CM

## 2024-07-22 DIAGNOSIS — E1139 Type 2 diabetes mellitus with other diabetic ophthalmic complication: Secondary | ICD-10-CM | POA: Diagnosis not present

## 2024-07-22 DIAGNOSIS — N3281 Overactive bladder: Secondary | ICD-10-CM

## 2024-07-22 DIAGNOSIS — G894 Chronic pain syndrome: Secondary | ICD-10-CM

## 2024-07-22 DIAGNOSIS — M546 Pain in thoracic spine: Secondary | ICD-10-CM

## 2024-07-22 DIAGNOSIS — E785 Hyperlipidemia, unspecified: Secondary | ICD-10-CM

## 2024-07-22 DIAGNOSIS — I10 Essential (primary) hypertension: Secondary | ICD-10-CM

## 2024-07-22 DIAGNOSIS — C50412 Malignant neoplasm of upper-outer quadrant of left female breast: Secondary | ICD-10-CM

## 2024-07-22 DIAGNOSIS — Z170 Estrogen receptor positive status [ER+]: Secondary | ICD-10-CM

## 2024-07-22 LAB — POCT GLYCOSYLATED HEMOGLOBIN (HGB A1C): Hemoglobin A1C: 6.5 % — AB (ref 4.0–5.6)

## 2024-07-22 MED ORDER — MOMETASONE FUROATE 50 MCG/ACT NA SUSP: 2.0000 | Freq: Every day | NASAL | 6 refills | Status: AC

## 2024-07-22 MED ORDER — OXYBUTYNIN CHLORIDE ER 10 MG PO TB24: 10.0000 mg | ORAL_TABLET | Freq: Every day | ORAL | 1 refills | Status: AC

## 2024-07-22 MED ORDER — AMLODIPINE BESYLATE 5 MG PO TABS: 5.0000 mg | ORAL_TABLET | Freq: Every day | ORAL | 1 refills | Status: AC

## 2024-07-22 MED ORDER — HYDROCODONE-ACETAMINOPHEN 5-325 MG PO TABS: 1.0000 | ORAL_TABLET | Freq: Three times a day (TID) | ORAL | 0 refills | Status: AC

## 2024-07-22 MED ORDER — ALPRAZOLAM 0.25 MG PO TABS: 0.2500 mg | ORAL_TABLET | Freq: Two times a day (BID) | ORAL | 2 refills | Status: AC | PRN

## 2024-07-22 MED ORDER — METHOCARBAMOL 500 MG PO TABS: 500.0000 mg | ORAL_TABLET | Freq: Three times a day (TID) | ORAL | 1 refills | Status: AC | PRN

## 2024-07-22 MED ORDER — SIMVASTATIN 20 MG PO TABS: 20.0000 mg | ORAL_TABLET | Freq: Every day | ORAL | 1 refills | Status: AC

## 2024-07-22 MED ORDER — HYDROCODONE-ACETAMINOPHEN 5-325 MG PO TABS
ORAL_TABLET | ORAL | 0 refills | Status: AC
Start: 2024-07-22 — End: ?

## 2024-07-22 MED ORDER — HYDROCODONE-ACETAMINOPHEN 5-325 MG PO TABS: 1.0000 | ORAL_TABLET | Freq: Four times a day (QID) | ORAL | 0 refills | Status: AC | PRN

## 2024-07-22 MED ORDER — LISINOPRIL 20 MG PO TABS: 20.0000 mg | ORAL_TABLET | Freq: Every day | ORAL | 1 refills | Status: AC

## 2024-07-22 MED ORDER — MELOXICAM 15 MG PO TABS: 15.0000 mg | ORAL_TABLET | Freq: Every day | ORAL | 0 refills | Status: AC

## 2024-07-22 NOTE — Assessment & Plan Note (Signed)
Stable Continue current medications F/u for new, worsening symptoms 

## 2024-07-22 NOTE — Assessment & Plan Note (Signed)
 invasive lobular carcinoma, stage IA, pT1b, cN0, ER+/PR+/HER2-, Grade 2  -found on screening mammogram. S/p left lumpectomy on 09/26/21 by Dr. Vernetta showed: 1 cm invasive ductal carcinoma; low grade DCIS; ALH. Margins negative. -s/p ultra-hypofractionated radiation therapy under Dr. Izell 2/27-11/03/21. -she started tamoxifen  in late 11/2021, plan for 5 years.

## 2024-07-22 NOTE — Assessment & Plan Note (Signed)
 Lipids are well-controlled on current.  Last LDL was 48.

## 2024-07-22 NOTE — Progress Notes (Signed)
 Established Patient Office Visit     CC/Reason for Visit: Follow-up chronic medical conditions  HPI: [name] is a 83 y.o. female who is coming in today for the above mentioned reasons. Past Medical History is significant for: Hypertension, hyperlipidemia, type 2 diabetes, generalized anxiety disorder, OSA, chronic pain syndrome, breast cancer.  Feeling well without acute concerns or complaints.  Is needing all of her medications refilled.   Past Medical/Surgical History: Past Medical History:  Diagnosis Date   Allergy    Anxiety    Arthritis    PAIN AND OA RIGHT KNEE; PAIN AND DDD AND SPINAL STENOSIS   Cancer (HCC)    Complication of anesthesia    PULLED OUT MY IV'S AND FOLEY - HALLUCINATIONS WITH HIATAL HERNIA SURGERY   Depression    Diabetes mellitus without complication (HCC)    Diverticulosis    PER COLONOSCOPY   GERD (gastroesophageal reflux disease)    OVER THE COUNTER MEDS IF NEEDED   Heart murmur    VERY SLIGHT ALL MY LIFE   Hyperlipidemia    Hypertension    Sleep apnea    does not tolerate CPAP - HAS NOT USED IN OVER 2 YRS    Past Surgical History:  Procedure Laterality Date   BACK SURGERY     BREAST LUMPECTOMY WITH RADIOACTIVE SEED LOCALIZATION Left 09/26/2021   Procedure: LEFT BREAST LUMPECTOMY WITH RADIOACTIVE SEED LOCALIZATION;  Surgeon: Vernetta Berg, MD;  Location: [facility];  Service: General;  Laterality: Left;   COLONOSCOPY     EYE SURGERY     bilateral cataracts   HIATAL HERNIA REPAIR  09/03/2006   LUMBAR FUSION     LUMBAR LAMINECTOMY     PARTIAL KNEE ARTHROPLASTY Right 07/02/2014   Procedure: RIGHT MEDIAL UNICOMPARTMENTAL KNEE;  Surgeon: Donnice JONETTA Car, MD;  Location: [facility];  Service: Orthopedics;  Laterality: Right;   UPPER GASTROINTESTINAL ENDOSCOPY      Social History:  reports that she has never smoked. She has never used smokeless tobacco. She reports that she does not drink alcohol  and does not use  drugs.  Allergies: Allergies  Allergen Reactions   Ambien  [Zolpidem  Tartrate]     amnesia   Hydrochlorothiazide      Other reaction(s): Lethargy (intolerance)   Prednisone     Rapid heart rate   Thiazide-Type Diuretics Other (See Comments)    could not physically move   Sulfamethoxazole Rash    Family History:  Family History  Problem Relation Age of Onset   Heart disease Mother    Breast cancer Mother    Heart disease Father    Heart disease Sister    Pulmonary embolism Sister    Diabetes Paternal Grandfather      Current Outpatient Medications:    Accu-Chek FastClix Lancets MISC, 1 each by Does not apply route daily., Disp: 100 each, Rfl: 12   alendronate  (FOSAMAX ) 70 MG tablet, Take 1 tablet (70 mg total) by mouth once a week. Take with a full glass of water on an empty stomach., Disp: 4 tablet, Rfl: 5   Artificial Tear Ointment (DRY EYES OP), Place 1 drop into both eyes at bedtime as needed (Dry eye)., Disp: , Rfl:    Blood Glucose Monitoring Suppl (ACCU-CHEK GUIDE ME) w/Device KIT, 1 each by Does not apply route daily., Disp: 1 kit, Rfl: 2   diclofenac (VOLTAREN) 75 MG EC tablet, Take 1 tablet twice a day by oral route with meal(s)., Disp: , Rfl:  diclofenac Sodium (VOLTAREN) 1 % GEL, Apply 4 g topically 4 (four) times daily. Apply to bilateral knees., Disp: , Rfl:    diphenhydramine -acetaminophen  (TYLENOL  PM) 25-500 MG TABS tablet, Take 2 tablets by mouth at bedtime., Disp: , Rfl:    fluticasone  (CUTIVATE ) 0.005 % ointment, Apply 1 application topically daily., Disp: , Rfl:    glucose blood (ACCU-CHEK GUIDE) test strip, 1 each by Other route daily. Use as instructed, Disp: 100 each, Rfl: 12   ketoconazole (NIZORAL) 2 % shampoo, Apply 1 application topically 3 (three) times a week., Disp: , Rfl:    raNITIdine  HCl (ZANTAC  PO), Take 4 mg by mouth at bedtime., Disp: , Rfl:    Sennosides (EX-LAX PO), Take 1 tablet by mouth at bedtime., Disp: , Rfl:    tamoxifen  (NOLVADEX )  20 MG tablet, TAKE ONE TABLET BY MOUTH DAILY, Disp: 90 tablet, Rfl: 3   triamcinolone  (NASACORT ) 55 MCG/ACT AERO nasal inhaler, Place 2 sprays into the nose daily., Disp: , Rfl:    triamcinolone  cream (KENALOG ) 0.1 %, APPLY TO RASH SPARINGLY ONCE DAILY, Disp: 30 g, Rfl: 0   ALPRAZolam  (XANAX ) 0.25 MG tablet, Take 1 tablet (0.25 mg total) by mouth 2 (two) times daily as needed. for anxiety, Disp: 60 tablet, Rfl: 2   amLODipine  (NORVASC ) 5 MG tablet, Take 1 tablet (5 mg total) by mouth daily., Disp: 90 tablet, Rfl: 1   HYDROcodone -acetaminophen  (NORCO/VICODIN) 5-325 MG tablet, Take 1 tablet by mouth 3 (three) times daily., Disp: 90 tablet, Rfl: 0   HYDROcodone -acetaminophen  (NORCO/VICODIN) 5-325 MG tablet, TAKE ONE TABLET BY MOUTH THREE TIMES DAILY AS NEEDED MODERATE OR FOR SEVERE PAIN, Disp: 90 tablet, Rfl: 0   HYDROcodone -acetaminophen  (NORCO/VICODIN) 5-325 MG tablet, Take 1 tablet by mouth every 6 (six) hours as needed for moderate pain (pain score 4-6)., Disp: 90 tablet, Rfl: 0   lisinopril  (ZESTRIL ) 20 MG tablet, Take 1 tablet (20 mg total) by mouth daily., Disp: 90 tablet, Rfl: 1   meloxicam  (MOBIC ) 15 MG tablet, Take 1 tablet (15 mg total) by mouth daily., Disp: 90 tablet, Rfl: 0   methocarbamol  (ROBAXIN ) 500 MG tablet, Take 1 tablet (500 mg total) by mouth every 8 (eight) hours as needed for muscle spasms., Disp: 30 tablet, Rfl: 1   mometasone  (NASONEX ) 50 MCG/ACT nasal spray, Place 2 sprays into the nose at bedtime., Disp: 17 g, Rfl: 6   oxybutynin  (DITROPAN -XL) 10 MG 24 hr tablet, Take 1 tablet (10 mg total) by mouth daily., Disp: 90 tablet, Rfl: 1   simvastatin  (ZOCOR ) 20 MG tablet, Take 1 tablet (20 mg total) by mouth at bedtime., Disp: 90 tablet, Rfl: 1  Review of Systems:  Negative unless indicated in HPI.   Physical Exam: Vitals:   07/22/24 1358 07/22/24 1417  BP: 110/80 110/78  Pulse: 76   Temp: 98.6 F (37 C)   TempSrc: Oral   SpO2: 96%   Weight: 151 lb 14.4 oz (68.9 kg)      Body mass index is 26.91 kg/m.   Physical Exam Vitals reviewed.  Constitutional:      Appearance: Normal appearance.  HENT:     Head: Normocephalic and atraumatic.  Eyes:     Conjunctiva/sclera: Conjunctivae normal.  Cardiovascular:     Rate and Rhythm: Normal rate and regular rhythm.     Heart sounds: Murmur heard.  Pulmonary:     Effort: Pulmonary effort is normal.     Breath sounds: Normal breath sounds.  Skin:    General: Skin  is warm and dry.  Neurological:     General: No focal deficit present.     Mental Status: She is alert and oriented to person, place, and time.  Psychiatric:        Mood and Affect: Mood normal.        Behavior: Behavior normal.        Thought Content: Thought content normal.        Judgment: Judgment normal.      Impression and Plan:  Type 2 diabetes mellitus with other ophthalmic complication, without long-term current use of insulin (HCC) Assessment & Plan: Well-controlled with an A1c of 6.5.  Orders: -     POCT glycosylated hemoglobin (Hb A1C)  Generalized anxiety disorder Assessment & Plan: Alprazolam  refilled today.  Mood is stable.  Orders: -     ALPRAZolam ; Take 1 tablet (0.25 mg total) by mouth 2 (two) times daily as needed. for anxiety  Dispense: 60 tablet; Refill: 2  Essential hypertension Assessment & Plan: Well-controlled on current.  Orders: -     amLODIPine  Besylate; Take 1 tablet (5 mg total) by mouth daily.  Dispense: 90 tablet; Refill: 1 -     Lisinopril ; Take 1 tablet (20 mg total) by mouth daily.  Dispense: 90 tablet; Refill: 1  Chronic pain syndrome Assessment & Plan: -PDMP reviewed, no red flags, overdose risk score is 80. -Refill hydrocodone  5/325 mg for total of 90 tablets a month x 3 months.  Orders: -     HYDROcodone -Acetaminophen ; Take 1 tablet by mouth 3 (three) times daily.  Dispense: 90 tablet; Refill: 0 -     HYDROcodone -Acetaminophen ; TAKE ONE TABLET BY MOUTH THREE TIMES DAILY AS NEEDED  MODERATE OR FOR SEVERE PAIN  Dispense: 90 tablet; Refill: 0 -     HYDROcodone -Acetaminophen ; Take 1 tablet by mouth every 6 (six) hours as needed for moderate pain (pain score 4-6).  Dispense: 90 tablet; Refill: 0 -     Meloxicam ; Take 1 tablet (15 mg total) by mouth daily.  Dispense: 90 tablet; Refill: 0  Overactive bladder Assessment & Plan: Stable Continue current medications F/u for new, worsening symptoms   Orders: -     Mometasone  Furoate; Place 2 sprays into the nose at bedtime.  Dispense: 17 g; Refill: 6 -     oxyBUTYnin  Chloride ER; Take 1 tablet (10 mg total) by mouth daily.  Dispense: 90 tablet; Refill: 1  Hyperlipidemia, unspecified hyperlipidemia type Assessment & Plan: Lipids are well-controlled on current.  Last LDL was 48.  Orders: -     Simvastatin ; Take 1 tablet (20 mg total) by mouth at bedtime.  Dispense: 90 tablet; Refill: 1  Malignant neoplasm of upper-outer quadrant of left breast in female, estrogen receptor positive (HCC) Assessment & Plan: invasive lobular carcinoma, stage IA, pT1b, cN0, ER+/PR+/HER2-, Grade 2  -found on screening mammogram. S/p left lumpectomy on 09/26/21 by Dr. Vernetta showed: 1 cm invasive ductal carcinoma; low grade DCIS; ALH. Margins negative. -s/p ultra-hypofractionated radiation therapy under Dr. Izell 2/27-11/03/21. -she started tamoxifen  in late 11/2021, plan for 5 years.    Pain in thoracic spine -     Methocarbamol ; Take 1 tablet (500 mg total) by mouth every 8 (eight) hours as needed for muscle spasms.  Dispense: 30 tablet; Refill: 1     Time spent:33 minutes reviewing chart, interviewing and examining patient and formulating plan of care.     Tully Theophilus Andrews, MD [facility]

## 2024-07-22 NOTE — Assessment & Plan Note (Signed)
 Well-controlled on current.

## 2024-07-22 NOTE — Assessment & Plan Note (Signed)
 Alprazolam  refilled today.  Mood is stable.

## 2024-07-22 NOTE — Assessment & Plan Note (Signed)
-  PDMP reviewed, no red flags, overdose risk score is 80. -Refill hydrocodone 5/325 mg for total of 90 tablets a month x 3 months.

## 2024-07-22 NOTE — Assessment & Plan Note (Signed)
Well controlled with an A1c of 6.5.

## 2024-07-23 ENCOUNTER — Ambulatory Visit
Admission: RE | Admit: 2024-07-23 | Discharge: 2024-07-23 | Disposition: A | Discharge status: Home / Self Care | Source: Ambulatory Visit | Attending: Hematology | Admitting: Hematology

## 2024-07-23 DIAGNOSIS — C50412 Malignant neoplasm of upper-outer quadrant of left female breast: Secondary | ICD-10-CM

## 2024-10-26 ENCOUNTER — Ambulatory Visit: Admitting: Internal Medicine

## 2024-11-30 ENCOUNTER — Inpatient Hospital Stay

## 2024-11-30 ENCOUNTER — Inpatient Hospital Stay: Admitting: Nurse Practitioner
